# Patient Record
Sex: Female | Born: 1940
Health system: Southern US, Community
[De-identification: ages and names within clinical notes are randomized; demographics above are authoritative.]

## PROBLEM LIST (undated history)

## (undated) DIAGNOSIS — I10 Essential (primary) hypertension: Secondary | ICD-10-CM

## (undated) DIAGNOSIS — M4800 Spinal stenosis, site unspecified: Secondary | ICD-10-CM

## (undated) DIAGNOSIS — H04123 Dry eye syndrome of bilateral lacrimal glands: Secondary | ICD-10-CM

## (undated) DIAGNOSIS — I82409 Acute embolism and thrombosis of unspecified deep veins of unspecified lower extremity: Secondary | ICD-10-CM

## (undated) DIAGNOSIS — E785 Hyperlipidemia, unspecified: Secondary | ICD-10-CM

## (undated) HISTORY — DX: Hyperlipidemia, unspecified: E78.5

## (undated) HISTORY — DX: Essential (primary) hypertension: I10

## (undated) HISTORY — DX: Acute embolism and thrombosis of unspecified deep veins of unspecified lower extremity: I82.409

## (undated) HISTORY — DX: Spinal stenosis, site unspecified: M48.00

## (undated) HISTORY — DX: Dry eye syndrome of bilateral lacrimal glands: H04.123

---

## 2003-04-15 ENCOUNTER — Ambulatory Visit (HOSPITAL_COMMUNITY): Admission: RE | Admit: 2003-04-15 | Discharge: 2003-04-15 | Payer: Self-pay | Admitting: Internal Medicine

## 2003-04-15 ENCOUNTER — Encounter: Payer: Self-pay | Admitting: Internal Medicine

## 2003-05-16 ENCOUNTER — Encounter: Payer: Self-pay | Admitting: Neurosurgery

## 2003-05-16 ENCOUNTER — Encounter: Admission: RE | Admit: 2003-05-16 | Discharge: 2003-05-16 | Payer: Self-pay | Admitting: Neurosurgery

## 2003-10-30 ENCOUNTER — Emergency Department (HOSPITAL_COMMUNITY): Admission: EM | Admit: 2003-10-30 | Discharge: 2003-10-30 | Payer: Self-pay | Admitting: Emergency Medicine

## 2004-07-22 ENCOUNTER — Ambulatory Visit (HOSPITAL_COMMUNITY): Admission: RE | Admit: 2004-07-22 | Discharge: 2004-07-22 | Payer: Self-pay | Admitting: Family Medicine

## 2004-09-01 ENCOUNTER — Other Ambulatory Visit: Admission: RE | Admit: 2004-09-01 | Discharge: 2004-09-01 | Payer: Self-pay

## 2005-08-25 ENCOUNTER — Ambulatory Visit (HOSPITAL_COMMUNITY): Admission: RE | Admit: 2005-08-25 | Discharge: 2005-08-25 | Payer: Self-pay | Admitting: Obstetrics and Gynecology

## 2006-01-03 ENCOUNTER — Emergency Department (HOSPITAL_COMMUNITY): Admission: EM | Admit: 2006-01-03 | Discharge: 2006-01-03 | Payer: Self-pay | Admitting: Obstetrics & Gynecology

## 2006-01-12 ENCOUNTER — Ambulatory Visit (HOSPITAL_COMMUNITY): Admission: RE | Admit: 2006-01-12 | Discharge: 2006-01-12 | Payer: Self-pay | Admitting: Orthopaedic Surgery

## 2006-08-31 ENCOUNTER — Ambulatory Visit (HOSPITAL_COMMUNITY): Admission: RE | Admit: 2006-08-31 | Discharge: 2006-08-31 | Payer: Self-pay | Admitting: Pulmonary Disease

## 2007-09-04 ENCOUNTER — Ambulatory Visit (HOSPITAL_COMMUNITY): Admission: RE | Admit: 2007-09-04 | Discharge: 2007-09-04 | Payer: Self-pay | Admitting: Family Medicine

## 2007-09-14 ENCOUNTER — Other Ambulatory Visit: Admission: RE | Admit: 2007-09-14 | Discharge: 2007-09-14 | Payer: Self-pay | Admitting: Obstetrics and Gynecology

## 2008-08-23 DIAGNOSIS — H04123 Dry eye syndrome of bilateral lacrimal glands: Secondary | ICD-10-CM

## 2008-08-23 HISTORY — DX: Dry eye syndrome of bilateral lacrimal glands: H04.123

## 2008-09-06 ENCOUNTER — Ambulatory Visit (HOSPITAL_COMMUNITY): Admission: RE | Admit: 2008-09-06 | Discharge: 2008-09-06 | Payer: Self-pay | Admitting: Pulmonary Disease

## 2008-10-24 ENCOUNTER — Other Ambulatory Visit: Admission: RE | Admit: 2008-10-24 | Discharge: 2008-10-24 | Payer: Self-pay | Admitting: Obstetrics and Gynecology

## 2008-12-17 ENCOUNTER — Encounter: Payer: Self-pay | Admitting: Physician Assistant

## 2009-04-07 ENCOUNTER — Encounter: Payer: Self-pay | Admitting: Internal Medicine

## 2009-04-09 ENCOUNTER — Encounter: Payer: Self-pay | Admitting: Internal Medicine

## 2009-04-09 ENCOUNTER — Ambulatory Visit: Payer: Self-pay | Admitting: Internal Medicine

## 2009-04-09 ENCOUNTER — Ambulatory Visit (HOSPITAL_COMMUNITY): Admission: RE | Admit: 2009-04-09 | Discharge: 2009-04-09 | Payer: Self-pay | Admitting: Internal Medicine

## 2009-04-09 HISTORY — PX: COLONOSCOPY: SHX174

## 2009-04-15 ENCOUNTER — Encounter: Payer: Self-pay | Admitting: Internal Medicine

## 2009-07-12 ENCOUNTER — Emergency Department (HOSPITAL_COMMUNITY): Admission: EM | Admit: 2009-07-12 | Discharge: 2009-07-12 | Payer: Self-pay | Admitting: Emergency Medicine

## 2009-09-08 ENCOUNTER — Ambulatory Visit (HOSPITAL_COMMUNITY): Admission: RE | Admit: 2009-09-08 | Discharge: 2009-09-08 | Payer: Self-pay | Admitting: Pulmonary Disease

## 2009-11-13 ENCOUNTER — Ambulatory Visit (HOSPITAL_COMMUNITY): Admission: RE | Admit: 2009-11-13 | Discharge: 2009-11-13 | Payer: Self-pay | Admitting: Obstetrics & Gynecology

## 2009-11-28 ENCOUNTER — Encounter: Payer: Self-pay | Admitting: Physician Assistant

## 2010-01-27 ENCOUNTER — Ambulatory Visit: Payer: Self-pay | Admitting: Family Medicine

## 2010-01-27 DIAGNOSIS — M109 Gout, unspecified: Secondary | ICD-10-CM | POA: Insufficient documentation

## 2010-01-27 DIAGNOSIS — I10 Essential (primary) hypertension: Secondary | ICD-10-CM

## 2010-01-27 DIAGNOSIS — J3089 Other allergic rhinitis: Secondary | ICD-10-CM | POA: Insufficient documentation

## 2010-01-27 DIAGNOSIS — J309 Allergic rhinitis, unspecified: Secondary | ICD-10-CM

## 2010-01-28 ENCOUNTER — Encounter: Payer: Self-pay | Admitting: Physician Assistant

## 2010-01-30 ENCOUNTER — Encounter: Payer: Self-pay | Admitting: Physician Assistant

## 2010-02-13 ENCOUNTER — Encounter: Payer: Self-pay | Admitting: Physician Assistant

## 2010-02-13 DIAGNOSIS — M545 Low back pain: Secondary | ICD-10-CM

## 2010-02-13 DIAGNOSIS — E785 Hyperlipidemia, unspecified: Secondary | ICD-10-CM

## 2010-02-13 DIAGNOSIS — M48062 Spinal stenosis, lumbar region with neurogenic claudication: Secondary | ICD-10-CM | POA: Insufficient documentation

## 2010-04-25 ENCOUNTER — Encounter: Payer: Self-pay | Admitting: Physician Assistant

## 2010-04-28 ENCOUNTER — Encounter: Payer: Self-pay | Admitting: Physician Assistant

## 2010-04-28 LAB — CONVERTED CEMR LAB
ALT: 16 units/L (ref 0–35)
AST: 18 units/L (ref 0–37)
Albumin: 4.2 g/dL (ref 3.5–5.2)
Alkaline Phosphatase: 53 units/L (ref 39–117)
BUN: 20 mg/dL (ref 6–23)
CO2: 27 meq/L (ref 19–32)
Calcium: 10.4 mg/dL (ref 8.4–10.5)
Chloride: 104 meq/L (ref 96–112)
Cholesterol: 180 mg/dL (ref 0–200)
Creatinine, Ser: 0.76 mg/dL (ref 0.40–1.20)
Glucose, Bld: 114 mg/dL — ABNORMAL HIGH (ref 70–99)
HCT: 36.9 % (ref 36.0–46.0)
HDL: 45 mg/dL (ref >39)
Hemoglobin: 12.4 g/dL (ref 12.0–15.0)
Hgb A1c MFr Bld: 5.8 % — ABNORMAL HIGH (ref <5.7)
LDL Cholesterol: 110 mg/dL — ABNORMAL HIGH (ref 0–99)
MCHC: 33.6 g/dL (ref 30.0–36.0)
MCV: 90.2 fL (ref 78.0–100.0)
Platelets: 244 10*3/uL (ref 150–400)
Potassium: 3.6 meq/L (ref 3.5–5.3)
RBC: 4.09 M/uL (ref 3.87–5.11)
RDW: 13.3 % (ref 11.5–15.5)
Sodium: 142 meq/L (ref 135–145)
TSH: 0.897 microintl units/mL (ref 0.350–4.500)
Total Bilirubin: 0.5 mg/dL (ref 0.3–1.2)
Total CHOL/HDL Ratio: 4
Total Protein: 7 g/dL (ref 6.0–8.3)
Triglycerides: 123 mg/dL (ref <150)
Uric Acid, Serum: 5.7 mg/dL (ref 2.4–7.0)
VLDL: 25 mg/dL (ref 0–40)
Vit D, 25-Hydroxy: 49 ng/mL (ref 30–89)
WBC: 5.8 10*3/uL (ref 4.0–10.5)

## 2010-04-29 ENCOUNTER — Ambulatory Visit: Payer: Self-pay | Admitting: Family Medicine

## 2010-04-29 DIAGNOSIS — R7301 Impaired fasting glucose: Secondary | ICD-10-CM | POA: Insufficient documentation

## 2010-04-29 DIAGNOSIS — R7303 Prediabetes: Secondary | ICD-10-CM | POA: Insufficient documentation

## 2010-05-29 ENCOUNTER — Encounter: Payer: Self-pay | Admitting: Physician Assistant

## 2010-05-29 ENCOUNTER — Ambulatory Visit: Payer: Self-pay | Admitting: Family Medicine

## 2010-05-29 DIAGNOSIS — M25539 Pain in unspecified wrist: Secondary | ICD-10-CM

## 2010-06-01 ENCOUNTER — Telehealth: Payer: Self-pay | Admitting: Family Medicine

## 2010-06-02 LAB — CONVERTED CEMR LAB: Uric Acid, Serum: 6.4 mg/dL (ref 2.4–7.0)

## 2010-06-29 ENCOUNTER — Telehealth: Payer: Self-pay | Admitting: Family Medicine

## 2010-07-29 LAB — CONVERTED CEMR LAB
BUN: 23 mg/dL (ref 6–23)
CO2: 24 meq/L (ref 19–32)
Calcium: 10.2 mg/dL (ref 8.4–10.5)
Chloride: 106 meq/L (ref 96–112)
Cholesterol: 219 mg/dL — ABNORMAL HIGH (ref 0–200)
Creatinine, Ser: 0.7 mg/dL (ref 0.40–1.20)
Glucose, Bld: 100 mg/dL — ABNORMAL HIGH (ref 70–99)
HDL: 56 mg/dL (ref >39)
Hgb A1c MFr Bld: 6.1 % — ABNORMAL HIGH (ref <5.7)
LDL Cholesterol: 140 mg/dL — ABNORMAL HIGH (ref 0–99)
Potassium: 4.4 meq/L (ref 3.5–5.3)
Sodium: 144 meq/L (ref 135–145)
Total CHOL/HDL Ratio: 3.9
Triglycerides: 113 mg/dL (ref <150)
VLDL: 23 mg/dL (ref 0–40)

## 2010-07-30 ENCOUNTER — Ambulatory Visit: Payer: Self-pay | Admitting: Family Medicine

## 2010-09-10 ENCOUNTER — Ambulatory Visit (HOSPITAL_COMMUNITY)
Admission: RE | Admit: 2010-09-10 | Discharge: 2010-09-10 | Payer: Self-pay | Source: Home / Self Care | Attending: Family Medicine | Admitting: Family Medicine

## 2010-09-10 ENCOUNTER — Telehealth: Payer: Self-pay | Admitting: Family Medicine

## 2010-09-22 NOTE — Letter (Signed)
Summary: Letter  Letter   Imported By: Lind Guest 01/30/2010 15:55:18  _____________________________________________________________________  External Attachment:    Type:   Image     Comment:   External Document

## 2010-09-22 NOTE — Letter (Signed)
Summary: medical release  medical release   Imported By: Lind Guest 01/30/2010 16:13:46  _____________________________________________________________________  External Attachment:    Type:   Image     Comment:   External Document

## 2010-09-22 NOTE — Progress Notes (Signed)
  Phone Note Call from Patient   Caller: Patient Summary of Call: requesting refills on indomethacin and hydrocodone Initial call taken by: Adella Hare LPN,  June 29, 2010 4:45 PM  Follow-up for Phone Call        deny both from the notes her gout was better, will need to sched an ov if this is gain a prob Follow-up by: Syliva Overman MD,  June 29, 2010 5:21 PM  Additional Follow-up for Phone Call Additional follow up Details #1::        said it was better and now it has flared up and we have no appoinments for this week she called here Additional Follow-up by: Lind Guest,  June 30, 2010 10:15 AM    Additional Follow-up for Phone Call Additional follow up Details #2::    pls refill indomethacin only x 1 that is std treatment for gout pls document exactly where is hurting , is it swollen is it the same area? she needs an appt in the next 2 weeks. I will not send the hydrocodone for gout the anti-inflamm is all that's standard Follow-up by: Syliva Overman MD,  June 30, 2010 12:32 PM  Additional Follow-up for Phone Call Additional follow up Details #3:: Details for Additional Follow-up Action Taken: med sent, called patient, left message to return call   Additional Follow-up by: Adella Hare LPN,  July 01, 2010 2:04 PM  New/Updated Medications: INDOMETHACIN 50 MG CAPS (INDOMETHACIN) one cap by mouth three times a day Prescriptions: INDOMETHACIN 50 MG CAPS (INDOMETHACIN) one cap by mouth three times a day  #30 x 0   Entered by:   Adella Hare LPN   Authorized by:   Syliva Overman MD   Signed by:   Adella Hare LPN on 09/81/1914   Method used:   Electronically to        [facility]  [address] (retail)       [address]       [place], [place]  [address]       Ph: [phone]       Fax: [phone]   RxID:   [id number]  right wrist was swollen and hurting patient aware on indomethacin sent in  [facility] LPN  July 01, 2010 2:15 PM

## 2010-09-22 NOTE — Progress Notes (Signed)
Summary: DR.HAWKINS  DR.HAWKINS   Imported By: Lind Guest 02/16/2010 11:18:10  _____________________________________________________________________  External Attachment:    Type:   Image     Comment:   External Document

## 2010-09-22 NOTE — Assessment & Plan Note (Signed)
Summary: new patient- room 3   Vital Signs:  Patient profile:   70 year old female Height:      63.5 inches Weight:      179.75 pounds BMI:     31.46 O2 Sat:      97 % on Room air Pulse rate:   102 / minute Resp:     16 per minute BP sitting:   150 / 90  (left arm)  Vitals Entered By: Adella Hare LPN (January 28, 1307 1:45 PM)  Serial Vital Signs/Assessments:  Time      Position  BP       Pulse  Resp  Temp     By                     150/82                         Esperanza Sheets PA  CC: new patient  Is Patient Diabetic? No Pain Assessment Patient in pain? no        CC:  new patient .  History of Present Illness: New pt here to establish care with new PCP.  Pt has a hx of htn.  Her neice will check her BP at home and runs 120-130 over 70.  No HA, chest pain or pressure, or difficulty breathing.  Pt is also taking a potassium pill.  She did not bring this with her today & she does not know what strength it is.  Pt walks 4 miles every day for exercise.  Pt states she has had some back pain in the past is not bothering her now.  When it flares up she takes Ibuprofen.  Hx of gout.  Pt has decreased her Allopurinol dose to every other day.  Last gout flare up Nov 2010.  Last labs done 4-11.  Pt brought paperwork with her.      Lipids:  Total Chol 195, HDL 44, LDL 124, Trigs 134      Uric Acid 6.8 Last Mamm Jan 011 Last Pap March 2011 - Dr Rayna Sexton office.   Current Medications (verified): 1)  Tekturna Hct 300-25 Mg Tabs (Aliskiren-Hydrochlorothiazide) .... One Tab By Mouth Once Daily 2)  Multivitamins  Tabs (Multiple Vitamin) .... One Tab By Mouth Once Daily 3)  Flax Seed Oil 1000 Mg Caps (Flaxseed (Linseed)) .... One Cap By Mouth Once Daily 4)  Calcium Carbonate 600 Mg Tabs (Calcium Carbonate) .... One Tab By Mouth Once Daily 5)  Fish Oil 1000 Mg Caps (Omega-3 Fatty Acids) .... One Cap By Mouth Once Daily 6)  Allopurinol 300 Mg Tabs (Allopurinol) .... One Tab By Mouth  Once Daily 7)  Exforge 5-160 Mg Tabs (Amlodipine Besylate-Valsartan) .... One Tab By Mouth Once Daily  Allergies (verified): No Known Drug Allergies  Past History:  Past medical, surgical, family and social histories (including risk factors) reviewed for relevance to current acute and chronic problems.  Past Medical History: Hypertension Gout  Past Surgical History: Caesarean section  Family History: Reviewed history and no changes required. mother deceased-stroke father deceased- stroke 3 sisters deceased- DM, blood clots 4 brothers deceased- health status unknown  Social History: Reviewed history and no changes required. 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 ENT:  Denies nasal  congestion and sore throat. CV:  Denies chest pain or discomfort and palpitations. Resp:  Denies cough and shortness of breath. GI:  Denies abdominal pain, change in bowel habits, indigestion, nausea, and vomiting. GU:  Denies dysuria, incontinence, and urinary frequency. MS:  Denies low back pain.  Physical Exam  General:  Well-developed,well-nourished,in no acute distress; alert,appropriate and cooperative throughout examination Head:  Normocephalic and atraumatic without obvious abnormalities. No apparent alopecia or balding. Eyes:  pupils equal, pupils round, and pupils reactive to light.   Ears:  External ear exam shows no significant lesions or deformities.  Otoscopic examination reveals clear canals, tympanic membranes are intact bilaterally without bulging, retraction, inflammation or discharge. Hearing is grossly normal bilaterally. Nose:  External nasal examination shows no deformity or inflammation. Nasal mucosa are pink and moist without lesions or exudates. Mouth:  Oral mucosa and oropharynx without lesions or  exudates.  Neck:  No deformities, masses, or tenderness noted. Lungs:  Normal respiratory effort, chest expands symmetrically. Lungs are clear to auscultation, no crackles or wheezes. Heart:  Normal rate and regular rhythm. S1 and S2 normal without gallop, murmur, click, rub or other extra sounds. Extremities:  No PTE Neurologic:  alert & oriented X3, sensation intact to light touch, and gait normal.   Cervical Nodes:  No lymphadenopathy noted Psych:  Cognition and judgment appear intact. Alert and cooperative with normal attention span and concentration. No apparent delusions, illusions, hallucinations   Impression & Recommendations:  Problem # 1:  HYPERTENSION (ICD-401.9) Assessment Comment Only  Her updated medication list for this problem includes:    Tekturna Hct 300-25 Mg Tabs (Aliskiren-hydrochlorothiazide) ..... One tab by mouth once daily    Exforge 5-160 Mg Tabs (Amlodipine besylate-valsartan) ..... One tab by mouth once daily  BP today: 150/90  Orders: T-CMP with estimated GFR ([id number])  Problem # 2:  GOUT (ICD-274.9) Assessment: Comment Only  Her updated medication list for this problem includes:    Allopurinol 300 Mg Tabs (Allopurinol) ..... One tab by mouth once daily  Orders: T-Uric Acid (Blood) ([id number])  Problem # 3:  ALLERGIC RHINITIS, SEASONAL (ICD-477.0) Assessment: Deteriorated Pt states that her eye dr has taken her off of allergy meds in the past due to her dry eye syptoms.  She will check with them to see if there is anything she can take for this.  Complete Medication List: 1)  Tekturna Hct 300-25 Mg Tabs (Aliskiren-hydrochlorothiazide) .... One tab by mouth once daily 2)  Multivitamins Tabs (Multiple vitamin) .... One tab by mouth once daily 3)  Flax Seed Oil 1000 Mg Caps (Flaxseed (linseed)) .... One cap by mouth once daily 4)  Calcium Carbonate 600 Mg Tabs (Calcium carbonate) .... One tab by mouth once daily 5)  Fish Oil 1000 Mg Caps  (Omega-3 fatty acids) .... One cap by mouth once daily 6)  Allopurinol 300 Mg Tabs (Allopurinol) .... One tab by mouth once daily 7)  Exforge 5-160 Mg Tabs (Amlodipine besylate-valsartan) .... One tab by mouth once daily  Other Orders: T-Lipid Profile [id number]) T-CBC No Diff ([id number]) T-TSH [id number]) T-Vitamin D (25-Hydroxy) [id number])  Patient Instructions: 1)  Please schedule a follow-up appointment in 3 months. 2)  I have ordered blood work for you.  Please have have this drawn fasting before your appt in 3 months. 3)  Continue your current medications. 4)  Check your Blood Pressure regularly. If it is above170/100 you should make an appointment. 5)  It is important that you exercise regularly at least 20  minutes 5 times a week. If you develop chest pain, have severe difficulty breathing, or feel very tired , stop exercising immediately and seek medical attention. 6)  You need to lose weight. Consider a lower calorie diet and regular exercise.

## 2010-09-22 NOTE — Miscellaneous (Signed)
Summary: med history  Clinical Lists Changes  Problems: Added new problem of LOW BACK PAIN, CHRONIC (ICD-724.2) Added new problem of SPINAL STENOSIS (ICD-724.00) Added new problem of HYPERLIPIDEMIA (ICD-272.4) Added new problem of HYPOKALEMIA (ICD-276.8)

## 2010-09-22 NOTE — Assessment & Plan Note (Signed)
Summary: follow up- room 1   Vital Signs:  Patient profile:   70 year old female Height:      63.5 inches Weight:      180.50 pounds BMI:     31.59 O2 Sat:      98 % on Room air Pulse rate:   94 / minute Resp:     16 per minute BP sitting:   160 / 90  (left arm)  Vitals Entered By: Adella Hare LPN (April 29, 2010 1:19 PM)  Nutrition Counseling: Patient's BMI is greater than 25 and therefore counseled on weight management options.  Serial Vital Signs/Assessments:  Time      Position  BP       Pulse  Resp  Temp     By                     132/84                         Esperanza Sheets PA  CC: follow-up visit Is Patient Diabetic? No Pain Assessment Patient in pain? no        CC:  follow-up visit.  History of Present Illness: Pt is here today for check up and discuss recent lab results.  Overall is feeling well. Hx of htn. Taking medications as prescribed and denies side effects.  No headache, chest pain or palpitations.  Her blood pressure is much lower at home than in the Drs office.  Walking 4 miles a day for exercise.  Hx of gout.  No flare ups.  Is taking Allopurinol every other day.  Prev took it every day, but since was well controlled was able to decrease to every other day.  Denies: Headache, or dizziness. Chest pain or discomfort, palpitations, and peripheral edema. Cough or difficulty breathing. Abd pain, indigestion, and no change in bowel habits.  Has chronic constipation problems.  Last colonoscopy approx 2 yrs ago. No joint pain or swelling.    Current Medications (verified): 1)  Tekturna Hct 300-25 Mg Tabs (Aliskiren-Hydrochlorothiazide) .... One Tab By Mouth Once Daily 2)  Multivitamins  Tabs (Multiple Vitamin) .... One Tab By Mouth Once Daily 3)  Flax Seed Oil 1000 Mg Caps (Flaxseed (Linseed)) .... One Cap By Mouth Once Daily 4)  Calcium Carbonate 600 Mg Tabs (Calcium Carbonate) .... One Tab By Mouth Once Daily 5)  Fish Oil 1000 Mg Caps (Omega-3  Fatty Acids) .... One Cap By Mouth Once Daily 6)  Allopurinol 300 Mg Tabs (Allopurinol) .... One Tab By Mouth Once Daily 7)  Exforge 5-160 Mg Tabs (Amlodipine Besylate-Valsartan) .... One Tab By Mouth Once Daily  Allergies (verified): No Known Drug Allergies  Past History:  Past medical history reviewed for relevance to current acute and chronic problems.  Past Medical History: Reviewed history from 01/27/2010 and no changes required. Hypertension Gout  Review of Systems      See HPI GI:  Denies abdominal pain, bloody stools, and dark tarry stools. MS:  Denies joint pain, joint redness, and joint swelling.  Physical Exam  General:  Well-developed,well-nourished,in no acute distress; alert,appropriate and cooperative throughout examination Head:  Normocephalic and atraumatic without obvious abnormalities. No apparent alopecia or balding. Ears:  External ear exam shows no significant lesions or deformities.  Otoscopic examination reveals clear canals, tympanic membranes are intact bilaterally without bulging, retraction, inflammation or discharge. Hearing is grossly normal bilaterally. Nose:  External nasal examination shows no  deformity or inflammation. Nasal mucosa are pink and moist without lesions or exudates. Mouth:  Oral mucosa and oropharynx without lesions or exudates.  teeth missing.   Neck:  No deformities, masses, or tenderness noted. Lungs:  Normal respiratory effort, chest expands symmetrically. Lungs are clear to auscultation, no crackles or wheezes. Heart:  Normal rate and regular rhythm. S1 and S2 normal without gallop, murmur, click, rub or other extra sounds. Abdomen:  Bowel sounds positive,abdomen soft and non-tender without masses, organomegaly or hernias noted. Pulses:  R posterior tibial normal, R dorsalis pedis normal, L posterior tibial normal, and L dorsalis pedis normal.   Extremities:  No PTE Cervical Nodes:  No lymphadenopathy noted Psych:  Cognition and  judgment appear intact. Alert and cooperative with normal attention span and concentration. No apparent delusions, illusions, hallucinations   Impression & Recommendations:  Problem # 1:  HYPERTENSION (ICD-401.9) Assessment Comment Only  Her updated medication list for this problem includes:    Tekturna Hct 300-25 Mg Tabs (Aliskiren-hydrochlorothiazide) ..... One tab by mouth once daily    Exforge 5-160 Mg Tabs (Amlodipine besylate-valsartan) ..... One tab by mouth once daily  Orders: T-Basic Metabolic Panel [id number])  BP today: 160/90 Prior BP: 150/90 (01/27/2010)  Labs Reviewed: K+: 3.6 (04/25/2010) Creat: : 0.76 (04/25/2010)   Chol: 180 (04/25/2010)   HDL: 45 (04/25/2010)   LDL: 110 (04/25/2010)   TG: 123 (04/25/2010)  Problem # 2:  GOUT (ICD-274.9) Assessment: Comment Only  Controlled.  Uric acid < 6.0.  Her updated medication list for this problem includes:    Allopurinol 300 Mg Tabs (Allopurinol) ..... One tab by mouth one every other daily  Problem # 3:  HYPERLIPIDEMIA (ICD-272.4) Assessment: Comment Only  Orders: T-Lipid Profile [id number])  Labs Reviewed: SGOT: 18 (04/25/2010)   SGPT: 16 (04/25/2010)   HDL:45 (04/25/2010)  LDL:110 (04/25/2010)  Chol:180 (04/25/2010)  Trig:123 (04/25/2010)  Problem # 4:  IMPAIRED FASTING GLUCOSE (ICD-790.21) Assessment: New Discussed diet, wt loss and continue walking daily for exercise.  Orders: T- Hemoglobin A1C ([id number])  Complete Medication List: 1)  Tekturna Hct 300-25 Mg Tabs (Aliskiren-hydrochlorothiazide) .... One tab by mouth once daily 2)  Multivitamins Tabs (Multiple vitamin) .... One tab by mouth once daily 3)  Flax Seed Oil 1000 Mg Caps (Flaxseed (linseed)) .... One cap by mouth once daily 4)  Calcium Carbonate 600 Mg Tabs (Calcium carbonate) .... One tab by mouth once daily 5)  Fish Oil 1000 Mg Caps (Omega-3 fatty acids) .... One cap by mouth once daily 6)  Allopurinol 300 Mg Tabs (Allopurinol) ....  One tab by mouth one every other daily 7)  Exforge 5-160 Mg Tabs (Amlodipine besylate-valsartan) .... One tab by mouth once daily 8)  Klor-con 10 10 Meq Cr-tabs (Potassium chloride) .... Take one daily 9)  Restasis 0.05 % Emul (Cyclosporine) .... One drop each eye bid  Patient Instructions: 1)  Please schedule a follow-up appointment in 3 months. 2)  Have blood work drawn fasting before your next appt in 3 mos. 3)  Take an Aspirin every day. 4)  I recommend you try Benefiber for your constipation.  Other options are Metamucil or Fibercon. 5)  Follow a low fat diet. 6)  Decrease your sweets and carbs in your diet to help with your sugar.

## 2010-09-22 NOTE — Progress Notes (Signed)
Summary: wrist  Phone Note Call from Patient   Summary of Call: right wrist is doing good. no hurting. just let PA know. [phone] Initial call taken by: Rudene Anda,  June 01, 2010 10:57 AM  Follow-up for Phone Call        noted.  Will forward to PA Follow-up by: Everitt Amber LPN,  June 01, 2010 11:05 AM

## 2010-09-22 NOTE — Assessment & Plan Note (Signed)
Summary: OV Room 1    Vital Signs:  Patient profile:   70 year old female Height:      63.5 inches Weight:      175.25 pounds BMI:     30.67 O2 Sat:      98 % Pulse rate:   88 / minute Resp:     16 per minute BP sitting:   128 / 88  (left arm)  Vitals Entered By: Mauricia Area, CMA CC: having problems with tendonitis, ok during the day but can't sleep at night    CC:  having problems with tendonitis and ok during the day but can't sleep at night .  History of Present Illness: Pt presents today with c/o Rt wrist pain x 3 days.  She did lift a small TV a few days before and is wondering if she might have a tendonitis from it.  She has a hx of tendonitis in her wrist 5-6 yrs ago with same syptoms. Pt states she had sudden onset of pain and swelling.  Pain is worse at HS.  Has been red also.   Allergies: No Known Drug Allergies  Past History:  Past medical history reviewed for relevance to current acute and chronic problems.  Past Medical History: Reviewed history from 01/27/2010 and no changes required. Hypertension Gout  Review of Systems General:  Denies chills and fever. CV:  Denies chest pain or discomfort. Resp:  Denies shortness of breath. MS:  Complains of joint pain, joint redness, and joint swelling.  Physical Exam  General:  Well-developed,well-nourished,in no acute distress; alert,appropriate and cooperative throughout examination Head:  Normocephalic and atraumatic without obvious abnormalities. No apparent alopecia or balding. Lungs:  Normal respiratory effort, chest expands symmetrically. Lungs are clear to auscultation, no crackles or wheezes. Heart:  Normal rate and regular rhythm. S1 and S2 normal without gallop, murmur, click, rub or other extra sounds. Msk:  Lt wrist and dorsum of hand swollen and erythematous, + warm to touch, and very painful to palpation, decreased flexion and extension of wrist due to pain Pulses:  R radial normal.   Neurologic:   alert & oriented X3, sensation intact to light touch, and gait normal.   Skin:  no rashes.   Psych:  Cognition and judgment appear intact. Alert and cooperative with normal attention span and concentration. No apparent delusions, illusions, hallucinations   Impression & Recommendations:  Problem # 1:  WRIST PAIN, RIGHT (KVQ-259.56) Assessment New Suspect gout flare.  Orders: T-Uric Acid (Blood) ([id number]) Ketorolac-Toradol 15mg  (J1884) Depo- Medrol 80mg  (J1040) Admin of Therapeutic Inj  intramuscular or subcutaneous ([address]606)  Problem # 2:  HYPERTENSION (ICD-401.9) Assessment: Improved  Her updated medication list for this problem includes:    Tekturna Hct 300-25 Mg Tabs (Aliskiren-hydrochlorothiazide) ..... One tab by mouth once daily    Exforge 5-160 Mg Tabs (Amlodipine besylate-valsartan) ..... One tab by mouth once daily  BP today: 128/88 Prior BP: 160/90 (04/29/2010)  Labs Reviewed: K+: 3.6 (04/25/2010) Creat: : 0.76 (04/25/2010)   Chol: 180 (04/25/2010)   HDL: 45 (04/25/2010)   LDL: 110 (04/25/2010)   TG: 123 (04/25/2010)  Complete Medication List: 1)  Tekturna Hct 300-25 Mg Tabs (Aliskiren-hydrochlorothiazide) .... One tab by mouth once daily 2)  Multivitamins Tabs (Multiple vitamin) .... One tab by mouth once daily 3)  Flax Seed Oil 1000 Mg Caps (Flaxseed (linseed)) .... One cap by mouth once daily 4)  Calcium Carbonate 600 Mg Tabs (Calcium carbonate) .... One tab by mouth once daily  5)  Fish Oil 1000 Mg Caps (Omega-3 fatty acids) .... One cap by mouth once daily 6)  Allopurinol 300 Mg Tabs (Allopurinol) .... One tab by mouth one every other daily 7)  Exforge 5-160 Mg Tabs (Amlodipine besylate-valsartan) .... One tab by mouth once daily 8)  Klor-con 10 10 Meq Cr-tabs (Potassium chloride) .... Take one daily 9)  Restasis 0.05 % Emul (Cyclosporine) .... One drop each eye bid 10)  Indomethacin 50 Mg Caps (Indomethacin) .... Take 1 capsule three times a day 11)   Hydrocodone-acetaminophen 5-325 Mg Tabs (Hydrocodone-acetaminophen) .... Take 1 every 6 hrs as needed for pain  Patient Instructions: 1)  Keep your next scheduled appt. 2)  If you are not feeling much better on Monday, call the office and I will refer you back to Dr Hilda Lias. 3)  I have prescribed a sling for you. 4)  You have received Toradol for pain and Depo Medrol for inflammation. 5)  Take the Indomethacin three times a day. 6)  Use they Hydrocodone as needed for pain. 7)  Have  your blood drawn today. Prescriptions: EXFORGE 5-160 MG TABS (AMLODIPINE BESYLATE-VALSARTAN) one tab by mouth once daily  #30 x 3   Entered and Authorized by:   Esperanza Sheets PA   Signed by:   Esperanza Sheets PA on 05/29/2010   Method used:   Electronically to        [facility]  [address] (retail)       [address]       [place], [place]  [address]       Ph: [phone]       Fax: [phone]   RxID:   [id number] TEKTURNA HCT 300-25 MG TABS (ALISKIREN-HYDROCHLOROTHIAZIDE) one tab by mouth once daily  #30 x 3   Entered and Authorized by:   Esperanza Sheets PA   Signed by:   Esperanza Sheets PA on 05/29/2010   Method used:   Electronically to        [facility]  [address] (retail)       [address]       [place], [place]  [address]       Ph: [phone]       Fax: [phone]   RxID:   [id number] HYDROCODONE-ACETAMINOPHEN 5-325 MG TABS (HYDROCODONE-ACETAMINOPHEN) take 1 every 6 hrs as needed for pain  #20 x 0   Entered and Authorized by:   Esperanza Sheets PA   Signed by:   Esperanza Sheets PA on 05/29/2010   Method used:   Printed then faxed to ...       [facility]  [address] (retail)       [address]       [place], [place]  [address]       Ph: [phone]       Fax: [phone]   RxID:   [id number] KLOR-CON 10 10 MEQ CR-TABS (POTASSIUM CHLORIDE) take one daily  #30 x 3   Entered and Authorized by:   Esperanza Sheets PA   Signed by:   Esperanza Sheets  PA on 05/29/2010   Method used:   Electronically to        [facility]  [address] (retail)       [address]       [place]  [place], [place]  [address]       Ph: [phone]       Fax: [phone]   RxID:   [id number] INDOMETHACIN 50 MG CAPS (INDOMETHACIN) take 1 capsule three times a day  #30 x 0   Entered and Authorized by:   Esperanza Sheets PA   Signed by:   Esperanza Sheets PA on 05/29/2010   Method used:   Electronically to        [facility]  [address] (retail)       [address]       [place], [place]  [address]       Ph: [phone]       Fax: [phone]   RxID:   [id number]    Medication Administration  Injection # 1:    Medication: Ketorolac-Toradol 15mg     Diagnosis: WRIST PAIN, RIGHT (ZSW-109.32)    Route: IM    Site: RUOQ gluteus    Exp Date: 10/22/2011    Lot #: [id number]    Mfr: [email or web address]    Comments: 60 mg given    Patient tolerated injection without complications    Given by: Mauricia Area, CMA  Injection # 2:    Medication: Depo- Medrol 80mg     Diagnosis: WRIST PAIN, RIGHT (GUR-427.06)    Route: IM    Site: LUOQ gluteus    Exp Date: 12/2010    Lot #: [id number]    Mfr: Pharmacia    Patient tolerated injection without complications    Given by: Mauricia Area, CMA  Orders Added: 1)  T-Uric Acid (Blood) [[id number]] 2)  Ketorolac-Toradol 15mg  [J1885] 3)  Depo- Medrol 80mg  [J1040] 4)  Admin of Therapeutic Inj  intramuscular or subcutaneous [96372] 5)  Est. Patient Level III [73710]

## 2010-09-24 NOTE — Assessment & Plan Note (Signed)
Summary: office visit   Vital Signs:  Patient profile:   70 year old female Menstrual status:  hysterectomy Height:      63.5 inches Weight:      171.25 pounds BMI:     29.97 O2 Sat:      97 % on Room air Pulse rate:   100 / minute Pulse rhythm:   regular Resp:     16 per minute BP sitting:   130 / 72  (left arm)  Vitals Entered By: Adella Hare LPN (July 30, 2010 11:26 AM)  Nutrition Counseling: Patient's BMI is greater than 25 and therefore counseled on weight management options.  O2 Flow:  Room air CC: follow-up visit Is Patient Diabetic? No Pain Assessment Patient in pain? no          Menstrual Status hysterectomy   Primary Care Provider:  Syliva Overman MD  CC:  follow-up visit.  History of Present Illness: Reports  that she has been doing well. Denies recent fever or chills. Denies sinus pressure, nasal congestion , ear pain or sore throat. Denies chest congestion, or cough productive of sputum. Denies chest pain, palpitations, PND, orthopnea or leg swelling. Denies abdominal pain, nausea, vomitting, diarrhea or constipation. Denies change in bowel movements or bloody stool. Denies dysuria ,Increased l Denies headaches, vertigo, seizures. Denies depression, anxiety or insomnia. Denies  rash, lesions, or itch.     Current Medications (verified): 1)  Tekturna Hct 300-25 Mg Tabs (Aliskiren-Hydrochlorothiazide) .... One Tab By Mouth Once Daily 2)  Multivitamins  Tabs (Multiple Vitamin) .... One Tab By Mouth Once Daily 3)  Flax Seed Oil 1000 Mg Caps (Flaxseed (Linseed)) .... One Cap By Mouth Once Daily 4)  Calcium Carbonate 600 Mg Tabs (Calcium Carbonate) .... One Tab By Mouth Once Daily 5)  Fish Oil 1000 Mg Caps (Omega-3 Fatty Acids) .... One Cap By Mouth Once Daily 6)  Allopurinol 300 Mg Tabs (Allopurinol) .... One Tab By Mouth One Every Other Daily 7)  Exforge 5-160 Mg Tabs (Amlodipine Besylate-Valsartan) .... One Tab By Mouth Once Daily 8)   Klor-Con 10 10 Meq Cr-Tabs (Potassium Chloride) .... Take One Daily 9)  Restasis 0.05 % Emul (Cyclosporine) .... One Drop Each Eye Bid 10)  Indomethacin 50 Mg Caps (Indomethacin) .... One Cap By Mouth Three Times A Day 11)  Aspir-Low 81 Mg Tbec (Aspirin) .... One Tab By Mouth Once Daily  Allergies (verified): No Known Drug Allergies  Past History:  Past Medical History: Hypertension  since age approx in her 39's Gout   since 2009 Dry eyes on restasis since 2010 Dr. Charise Killian  Review of Systems      See HPI Eyes:  Complains of vision loss-both eyes; denies blurring, discharge, eye irritation, and eye pain. MS:  Complains of low back pain and mid back pain; chronic and unchanged. Endo:  Denies cold intolerance, excessive hunger, excessive thirst, and excessive urination. Heme:  Denies abnormal bruising, bleeding, enlarge lymph nodes, and fevers. Allergy:  Denies hives or rash and persistent infections.  Physical Exam  General:  Well-developed,well-nourished,in no acute distress; alert,appropriate and cooperative throughout examination HEENT: No facial asymmetry,  EOMI, No sinus tenderness, TM's Clear, oropharynx  pink and moist.   Chest: Clear to auscultation bilaterally.  CVS: S1, S2, No murmurs, No S3.   Abd: Soft, Nontender.  MS: decreased  ROM spine,adequate in  hips, shoulders and knees.  Ext: No edema.   CNS: CN 2-12 intact, power tone and sensation normal throughout.  Skin: Intact, no visible lesions or rashes.  Psych: Good eye contact, normal affect.  Memory intact, not anxious or depressed appearing.    Impression & Recommendations:  Problem # 1:  HYPERLIPIDEMIA (ICD-272.4) Assessment Deteriorated  Labs Reviewed: SGOT: 18 (04/25/2010)   SGPT: 16 (04/25/2010)   HDL:56 (07/29/2010), 45 (04/25/2010)  LDL:140 (07/29/2010), 110 (04/25/2010)  Chol:219 (07/29/2010), 180 (04/25/2010)  Trig:113 (07/29/2010), 123 (04/25/2010) Low fat dietdiscussed and encouraged  Problem #  2:  HYPERTENSION (ICD-401.9) Assessment: Unchanged  Her updated medication list for this problem includes:    Tekturna Hct 300-25 Mg Tabs (Aliskiren-hydrochlorothiazide) ..... One tab by mouth once daily    Exforge 5-160 Mg Tabs (Amlodipine besylate-valsartan) ..... One tab by mouth once daily  BP today: 130/72 Prior BP: 128/88 (05/29/2010)  Labs Reviewed: K+: 4.4 (07/29/2010) Creat: : 0.70 (07/29/2010)   Chol: 219 (07/29/2010)   HDL: 56 (07/29/2010)   LDL: 140 (07/29/2010)   TG: 113 (07/29/2010)  Problem # 3:  LOW BACK PAIN, CHRONIC (ICD-724.2) Assessment: Unchanged  The following medications were removed from the medication list:    Hydrocodone-acetaminophen 5-325 Mg Tabs (Hydrocodone-acetaminophen) .Marland Kitchen... Take 1 every 6 hrs as needed for pain Her updated medication list for this problem includes:    Indomethacin 50 Mg Caps (Indomethacin) ..... One cap by mouth three times a day    Aspir-low 81 Mg Tbec (Aspirin) ..... One tab by mouth once daily  Complete Medication List: 1)  Tekturna Hct 300-25 Mg Tabs (Aliskiren-hydrochlorothiazide) .... One tab by mouth once daily 2)  Multivitamins Tabs (Multiple vitamin) .... One tab by mouth once daily 3)  Flax Seed Oil 1000 Mg Caps (Flaxseed (linseed)) .... One cap by mouth once daily 4)  Calcium Carbonate 600 Mg Tabs (Calcium carbonate) .... One tab by mouth once daily 5)  Fish Oil 1000 Mg Caps (Omega-3 fatty acids) .... One cap by mouth once daily 6)  Allopurinol 300 Mg Tabs (Allopurinol) .... One tab by mouth one every other daily 7)  Exforge 5-160 Mg Tabs (Amlodipine besylate-valsartan) .... One tab by mouth once daily 8)  Klor-con 10 10 Meq Cr-tabs (Potassium chloride) .... Take one daily 9)  Restasis 0.05 % Emul (Cyclosporine) .... One drop each eye bid 10)  Indomethacin 50 Mg Caps (Indomethacin) .... One cap by mouth three times a day 11)  Aspir-low 81 Mg Tbec (Aspirin) .... One tab by mouth once daily  Other Orders: T- Hemoglobin  A1C ([id number])  Patient Instructions: 1)  Please schedule a follow-up appointment in 4 months. 2)  It is important that you exercise regularly at least 20 minutes 5 times a week. If you develop chest pain, have severe difficulty breathing, or feel very tired , stop exercising immediately and seek medical attention. 3)  You need to lose weight. Consider a lower calorie diet and regular exercise.  4)  HBA1C 5)  you need to follow a low fat and low carb diet. 6)  Continue the walking   Orders Added: 1)  Est. Patient Level IV [09811] 2)  T- Hemoglobin A1C [[id number]]

## 2010-09-24 NOTE — Progress Notes (Signed)
Summary: MEDICINE  Phone Note Call from Patient   Summary of Call: NEEDS HER ALLOPURINOL 300 MG SEND TO [facility] WAS PREVIOUSLY WRITTEN BY DR Juanetta Gosling Initial call taken by: Lind Guest,  September 10, 2010 9:57 AM  Follow-up for Phone Call        okay to fill? Follow-up by: Adella Hare LPN,  September 10, 2010 10:21 AM  Additional Follow-up for Phone Call Additional follow up Details #1::        I have entered allopurinol historically, pls send after you spk with her, she needs this, ALSO she absolutely needs to stop exforge due to new warningk with tekturna products, new is a higher dose of amlodipne , entered historically, notify pt andpharmacy send in also.  She needs an appt to be scheduled in 4 to 5 weeks, I will send a flag to front staff also Additional Follow-up by: Syliva Overman MD,  September 10, 2010 12:19 PM    Additional Follow-up for Phone Call Additional follow up Details #2::    meds sent, patient aware Follow-up by: Adella Hare LPN,  September 10, 2010 1:56 PM  New/Updated Medications: ALLOPURINOL 300 MG TABS (ALLOPURINOL) Take 1 tablet by mouth once a day AMLODIPINE BESYLATE 10 MG TABS (AMLODIPINE BESYLATE) Take 1 tablet by mouth once a day , pt to stop amlodipine/benazepril Prescriptions: AMLODIPINE BESYLATE 10 MG TABS (AMLODIPINE BESYLATE) Take 1 tablet by mouth once a day , pt to stop amlodipine/benazepril  #30 x 3   Entered by:   Adella Hare LPN   Authorized by:   Syliva Overman MD   Signed by:   Adella Hare LPN on 47/82/9562   Method used:   Electronically to        [facility]  [address] (retail)       [address]       [place], [place]  [address]       Ph: [phone]       Fax: [phone]   RxID:   [id number] ALLOPURINOL 300 MG TABS (ALLOPURINOL) Take 1 tablet by mouth once a day  #30 x 3   Entered by:   Adella Hare LPN   Authorized by:   Syliva Overman MD   Signed by:   Adella Hare LPN on 59/56/3875   Method used:    Electronically to        [facility]  [address] (retail)       [address]       [place], [place]  [address]       Ph: [phone]       Fax: [phone]   RxID:   [id number] AMLODIPINE BESYLATE 10 MG TABS (AMLODIPINE BESYLATE) Take 1 tablet by mouth once a day , pt to stop amlodipine/benazepril  #30 x 3   Entered and Authorized by:   Syliva Overman MD   Signed by:   Syliva Overman MD on 09/10/2010   Method used:   Historical   RxID:   [id number] ALLOPURINOL 300 MG TABS (ALLOPURINOL) Take 1 tablet by mouth once a day  #30 x 3   Entered and Authorized by:   Syliva Overman MD   Signed by:   Syliva Overman MD on 09/10/2010   Method used:   Historical   RxID:   [id number]

## 2010-10-04 ENCOUNTER — Encounter: Payer: Self-pay | Admitting: Family Medicine

## 2010-10-05 ENCOUNTER — Encounter: Payer: Self-pay | Admitting: Family Medicine

## 2010-10-05 ENCOUNTER — Ambulatory Visit (INDEPENDENT_AMBULATORY_CARE_PROVIDER_SITE_OTHER): Payer: Medicare PPO | Admitting: Family Medicine

## 2010-10-05 DIAGNOSIS — I10 Essential (primary) hypertension: Secondary | ICD-10-CM

## 2010-10-05 DIAGNOSIS — M109 Gout, unspecified: Secondary | ICD-10-CM

## 2010-10-05 DIAGNOSIS — E785 Hyperlipidemia, unspecified: Secondary | ICD-10-CM

## 2010-10-14 NOTE — Assessment & Plan Note (Signed)
Vital Signs:  Patient profile:   70 year old female Menstrual status:  hysterectomy Height:      63.5 inches Weight:      166.25 pounds BMI:     29.09 O2 Sat:      97 % Pulse rate:   100 / minute Pulse rhythm:   regular Resp:     16 per minute BP sitting:   160 / 90  (left arm) Cuff size:   large  Vitals Entered By: Everitt Amber LPN (October 05, 2010 12:56 PM)  Nutrition Counseling: Patient's BMI is greater than 25 and therefore counseled on weight management options. CC: Follow up chronic problems   Primary Care Provider:  Syliva Overman MD  CC:  Follow up chronic problems.  History of Present Illness: Reports  that she has been  doing well. Denies recent fever or chills. Denies sinus pressure, nasal congestion , ear pain or sore throat. Denies chest congestion, or cough productive of sputum. Denies chest pain, palpitations, PND, orthopnea or leg swelling. Denies abdominal pain, nausea, vomitting, diarrhea or constipation. Denies change in bowel movements or bloody stool. Denies dysuria , frequency, incontinence or hesitancy.  Denies headaches, vertigo, seizures. Denies depression, anxiety or insomnia. Denies  rash, lesions, or itch.     Current Medications (verified): 1)  Multivitamins  Tabs (Multiple Vitamin) .... One Tab By Mouth Once Daily 2)  Flax Seed Oil 1000 Mg Caps (Flaxseed (Linseed)) .... One Cap By Mouth Once Daily 3)  Calcium Carbonate 600 Mg Tabs (Calcium Carbonate) .... One Tab By Mouth Once Daily 4)  Fish Oil 1000 Mg Caps (Omega-3 Fatty Acids) .... One Cap By Mouth Once Daily 5)  Allopurinol 300 Mg Tabs (Allopurinol) .... One Tab By Mouth One Every Other Daily 6)  Klor-Con 10 10 Meq Cr-Tabs (Potassium Chloride) .... Take One Daily 7)  Restasis 0.05 % Emul (Cyclosporine) .... One Drop Each Eye Bid 8)  Aspir-Low 81 Mg Tbec (Aspirin) .... One Tab By Mouth Once Daily 9)  Allopurinol 300 Mg Tabs (Allopurinol) .... Take 1 Tablet By Mouth Once A  Day 10)  Amlodipine Besylate 10 Mg Tabs (Amlodipine Besylate) .... Take 1 Tablet By Mouth Once A Day , Pt To Stop Amlodipine/benazepril  Allergies (verified): No Known Drug Allergies  Past History:  Past Medical History: Hypertension  since age approx in her 65's Gout   since 2009 Dry eyes on restasis since 2010 Dr. Charise Killian spinal stenosis  Review of Systems      See HPI Eyes:  Denies discharge, eye pain, and red eye. MS:  Complains of low back pain and mid back pain. Endo:  Denies cold intolerance and excessive hunger. Heme:  Denies abnormal bruising and bleeding. Allergy:  Denies hives or rash and itching eyes.  Physical Exam  General:  Well-developed,well-nourished,in no acute distress; alert,appropriate and cooperative throughout examination HEENT: No facial asymmetry,  EOMI, No sinus tenderness, TM's Clear, oropharynx  pink and moist.   Chest: Clear to auscultation bilaterally.  CVS: S1, S2, No murmurs, No S3.   Abd: Soft, Nontender.  MS: decreased  ROM spine,adequate in  hips, shoulders and knees.  Ext: No edema.   CNS: CN 2-12 intact, power tone and sensation normal throughout.   Skin: Intact, no visible lesions or rashes.  Psych: Good eye contact, normal affect.  Memory intact, not anxious or depressed appearing.    Impression & Recommendations:  Problem # 1:  HYPERLIPIDEMIA (ICD-272.4) Assessment Comment Only  Labs Reviewed: SGOT: 18 (  04/25/2010)   SGPT: 16 (04/25/2010)   HDL:56 (07/29/2010), 45 (04/25/2010)  LDL:140 (07/29/2010), 110 (04/25/2010)  Chol:219 (07/29/2010), 180 (04/25/2010)  Trig:113 (07/29/2010), 123 (04/25/2010) Low fat dietdiscussed and encouraged  Problem # 2:  LOW BACK PAIN, CHRONIC (ICD-724.2) Assessment: Unchanged  The following medications were removed from the medication list:    Indomethacin 50 Mg Caps (Indomethacin) ..... One cap by mouth three times a day Her updated medication list for this problem includes:    Aspir-low 81 Mg  Tbec (Aspirin) ..... One tab by mouth once daily  Problem # 3:  HYPERTENSION (ICD-401.9) Assessment: Deteriorated  The following medications were removed from the medication list:    Tekturna Hct 300-25 Mg Tabs (Aliskiren-hydrochlorothiazide) ..... One tab by mouth once daily    Amlodipine Besylate 10 Mg Tabs (Amlodipine besylate) .Marland Kitchen... Take 1 tablet by mouth once a day , pt to stop amlodipine/benazepril Her updated medication list for this problem includes:    Amlodipine Besy-benazepril Hcl 10-20 Mg Caps (Amlodipine besy-benazepril hcl) .Marland Kitchen... Take 1 capsule by mouth once a day new med to start in place of amlodipine 10mg  for blood pressure  BP today: 160/90 Prior BP: 130/72 (07/30/2010)  Labs Reviewed: K+: 4.4 (07/29/2010) Creat: : 0.70 (07/29/2010)   Chol: 219 (07/29/2010)   HDL: 56 (07/29/2010)   LDL: 140 (07/29/2010)   TG: 113 (07/29/2010)  Orders: Medicare Electronic Prescription [phone])  Complete Medication List: 1)  Multivitamins Tabs (Multiple vitamin) .... One tab by mouth once daily 2)  Flax Seed Oil 1000 Mg Caps (Flaxseed (linseed)) .... One cap by mouth once daily 3)  Calcium Carbonate 600 Mg Tabs (Calcium carbonate) .... One tab by mouth once daily 4)  Fish Oil 1000 Mg Caps (Omega-3 fatty acids) .... One cap by mouth once daily 5)  Allopurinol 300 Mg Tabs (Allopurinol) .... One tab by mouth one every other daily 6)  Klor-con 10 10 Meq Cr-tabs (Potassium chloride) .... Take one daily 7)  Restasis 0.05 % Emul (Cyclosporine) .... One drop each eye bid 8)  Aspir-low 81 Mg Tbec (Aspirin) .... One tab by mouth once daily 9)  Allopurinol 300 Mg Tabs (Allopurinol) .... Take 1 tablet by mouth once a day 10)  Amlodipine Besy-benazepril Hcl 10-20 Mg Caps (Amlodipine besy-benazepril hcl) .... Take 1 capsule by mouth once a day new med to start in place of amlodipine 10mg  for blood pressure  Patient Instructions: 1)  f/u as before. 2)  New med for blood pressure, stop the  amlodipine pls once  you finish this bottle. 3)  Congrats on new regular exercise and weight loss. 4)  Pls remember to eat alot of veg and fruit, drink water. 5)  Labs before next visit Prescriptions: AMLODIPINE BESY-BENAZEPRIL HCL 10-20 MG CAPS (AMLODIPINE BESY-BENAZEPRIL HCL) Take 1 capsule by mouth once a day new med to start in place of amlodipine 10mg  for blood pressure  #30 x 3   Entered and Authorized by:   Syliva Overman MD   Signed by:   Syliva Overman MD on 10/05/2010   Method used:   Printed then faxed to ...       [facility]  [address] (retail)       [address]       [place], [place]  [address]       Ph: [phone]       Fax: [phone]   RxID:   [id number]    Orders Added: 1)  Est. Patient Level IV [81191] 2)  Medicare Electronic Prescription [phone]

## 2010-10-14 NOTE — Letter (Signed)
Summary: medical release   medical release   Imported By: Lind Guest 10/07/2010 08:31:03  _____________________________________________________________________  External Attachment:    Type:   Image     Comment:   External Document

## 2010-10-14 NOTE — Letter (Signed)
Summary: d/c  medicine  d/c  medicine   Imported By: Lind Guest 10/07/2010 08:38:32  _____________________________________________________________________  External Attachment:    Type:   Image     Comment:   External Document

## 2010-11-24 ENCOUNTER — Encounter: Payer: Self-pay | Admitting: Family Medicine

## 2010-11-25 ENCOUNTER — Other Ambulatory Visit: Payer: Self-pay | Admitting: Family Medicine

## 2010-11-26 ENCOUNTER — Encounter: Payer: Self-pay | Admitting: Family Medicine

## 2010-11-26 LAB — HEMOGLOBIN A1C
Hgb A1c MFr Bld: 5.5 % (ref <5.7)
Mean Plasma Glucose: 111 mg/dL (ref <117)

## 2010-11-30 ENCOUNTER — Encounter: Payer: Self-pay | Admitting: Family Medicine

## 2010-11-30 ENCOUNTER — Ambulatory Visit (INDEPENDENT_AMBULATORY_CARE_PROVIDER_SITE_OTHER): Payer: Medicare PPO | Admitting: Family Medicine

## 2010-11-30 DIAGNOSIS — E785 Hyperlipidemia, unspecified: Secondary | ICD-10-CM

## 2010-11-30 DIAGNOSIS — I10 Essential (primary) hypertension: Secondary | ICD-10-CM

## 2010-11-30 DIAGNOSIS — M109 Gout, unspecified: Secondary | ICD-10-CM

## 2010-11-30 MED ORDER — TRIAMTERENE-HCTZ 37.5-25 MG PO TABS: 1.0000 | ORAL_TABLET | Freq: Every day | ORAL | Status: DC

## 2010-11-30 MED ORDER — AMLODIPINE BESYLATE 10 MG PO TABS: 10.0000 mg | ORAL_TABLET | Freq: Every day | ORAL | Status: DC

## 2010-11-30 NOTE — Progress Notes (Signed)
  Subjective:    Patient ID: Kristen Cox, female    DOB: 11-06-1940, 70 y.o.   MRN: [id number] The PT is here for follow up and re-evaluation of chronic medical conditions, medication management and review of recent lab and radiology data.  Preventive health is updated, specifically  Cancer screening, Osteoporosis screening and Immunization.   Questions or concerns regarding consultations or procedures which the PT has had in the interim are  addressed. The PT denies any adverse reactions to current medications since the last visit.reports worsening constipation as well as a tickle in her throat on her BP med She has walks on average 5 days per week and lost 9 pounds since her visit 2 months ago.  Her HBA1C is now normal Hypertension This is a chronic problem. The current episode started more than 1 year ago. The problem has been gradually improving since onset. The problem is uncontrolled. There are no associated agents to hypertension. Risk factors for coronary artery disease include dyslipidemia and post-menopausal state.  Constipation This is a chronic problem. The current episode started more than 1 year ago. The problem has been gradually worsening since onset. Her stool frequency is 1 time per week or less. The patient is not on a high fiber diet. She exercises regularly. There has been adequate water intake. She has tried laxatives and stool softeners for the symptoms. The treatment provided mild relief.      Review of Systems  Gastrointestinal: Positive for constipation.  Denies recent fever or chills. Denies sinus pressure, nasal congestion, ear pain or sore throat. Denies chest congestion, productive cough or wheezing. Denies chest pains, palpitations, paroxysmal nocturnal dyspnea, orthopnea and leg swelling Denies abdominal pain, nausea, vomiting,diarrhea .  Denies rectal bleeding or change in bowel movement. Denies dysuria, frequency, hesitancy or incontinence. Denies joint  pain, swelling and limitation and mobility. Denies headaches, seizure, numbness, or tingling. Denies depression, anxiety or insomnia. Denies skin break down or rash.         Objective:   Physical Exam Patient alert and oriented and in no Cardiopulmonary distress.  HEENT: No facial asymmetry, EOMI, no sinus tenderness, TM's clear, Oropharynx pink and moist.  Neck supple no adenopathy.  Chest: Clear to auscultation bilaterally.  CVS: S1, S2 no murmurs, no S3.  ABD: Soft non tender. Bowel sounds normal.  Ext: No edema  MS: Adequate ROM spine, shoulders, hips and knees.  Skin: Intact, no ulcerations or rash noted.  Psych: Good eye contact, normal affect. Memory intact not anxious or depressed appearing.  CNS: CN 2-12 intact, power, tone and sensation normal throughout.        Assessment & Plan:  1. Hypertension: improved , but still sub optimal, due to intolerance to med, will change and f/u in 2 months Prediabetes: improved greatly with lifestyle change, pt applauded 3.Overweight: improved.Pt encouraged to continue same.

## 2010-11-30 NOTE — Patient Instructions (Addendum)
F/U in 2 months.  You need to stop the amlodipine/benazepril, you have a cough and tickle as a bad reaction to the benazepril, your blood pressure meds are now amlodipine and maxzide.  For constipation, pls take 2 colace pills daily, add raisin bran to your oatmeal, drink at least 48 to 64 ounces of water daily, and every Monday and Thursday, if no bowel movemnt 4 days before, then use a laxative of your choice, eg exlax.  CONGRATS on weight loss, and reversing your trend to becoming a diabetic. PLs keep up healthy eating and exercise program  fasting lipid and chem 7 in 2 months, also uric acid level  You have lost 9 pounds...great  pls increase calcium tablets to one twice daily

## 2010-12-22 ENCOUNTER — Telehealth: Payer: Self-pay | Admitting: Family Medicine

## 2010-12-23 MED ORDER — BENZONATATE 100 MG PO CAPS: 100.0000 mg | ORAL_CAPSULE | Freq: Three times a day (TID) | ORAL | Status: DC | PRN

## 2010-12-23 NOTE — Telephone Encounter (Signed)
Patient called in and states she didn't take this medication this AM she has every other morning.  She said that she coughs so hard at times that she looses her breath.  She did state that she was coughing mucus up.

## 2010-12-23 NOTE — Telephone Encounter (Signed)
i suggest tessalon perles 100mg  one 3 times daily as needed for cough #30 refill zero. Any color to mucus...yellow green , fever or chills, pls let me know I will send in an antibiotic course also. What about nasal drainage ?

## 2010-12-23 NOTE — Telephone Encounter (Signed)
Patient denies fever, chills, or body aches. Med sent, patient aware.

## 2010-12-23 NOTE — Telephone Encounter (Signed)
This is not a known s/e of amlodipine, ask about symptoms of post nasal drainage or reflux pls before the med is stopped, these are more the likely cause, if she has either , will try med for those . i  f not then I would be recommending clonidine as her new bp med, pls let me know the response

## 2011-01-05 NOTE — Op Note (Signed)
NAME:  Kristen Cox, Kristen Cox NO.:  [id number]   MEDICAL RECORD NO.:  [id number]          PATIENT TYPE:  AMB   LOCATION:  DAY                           FACILITY:  [facility]   PHYSICIAN:  R. Roetta Sessions, M.D. DATE OF BIRTH:  01/12/1941   DATE OF PROCEDURE:  04/09/2009  DATE OF DISCHARGE:                               OPERATIVE REPORT   Colonoscopy with snare polypectomy.   INDICATIONS FOR PROCEDURE:  A 70 year old lady who comes for screening  colonoscopy.  She has never had a colonoscopy before.  She did have a  flexible sigmoidoscopy in 2000, by Dr. Ouida Sills which was normal.  There is  no family history colon polyps or colon cancer.  Colonoscopy is now  being done as standard screening maneuver.  The risks, benefits,  alternatives and limitations have been discussed and questions answered.  Please see documentation in the medical record.   PROCEDURE NOTE:  O2 saturation, blood pressure, pulse and respirations  monitored throughout the entire procedure.  Conscious sedation: Versed 5  mg IV and Demerol 100 mg IV in divided doses.  Instrument: Pentax video  chip system.  Cetacaine spray for topical pharyngeal anesthesia.   FINDINGS:  Digital rectal exam revealed no abnormalities.  Endoscopic  findings:  The prep was adequate.  Colon:  The colonic mucosa was  surveyed from the rectosigmoid junction through the left transverse to  the right colon to the appendiceal orifice, ileocecal valve and cecum.  These structures were well seen and photographed for the record.  From  this level, scope was slowly and cautiously withdrawn.  All previously  mentioned mucosal surfaces were again seen.  The patient had a 4 mm flat  polyp in the base the cecum which was cold snared and recovered through  the scope.  The patient had left-sided diverticula.  The remaining  colonic mucosa appeared normal.  The scope was pulled down into the  rectum.  A thorough examination of the rectal mucosa  including  retroflexion of the anal verge demonstrated only a couple of anal  papilla.  The patient tolerated the procedure well.  Cecal withdrawal  time was 14 minutes.   IMPRESSION:  1. Anal papilla otherwise, normal rectum.  2. Left-sided diverticula.  3. Cecal polyp status post cold snare and removal.  4. The remainder of the colonic mucosa appeared normal.   RECOMMENDATIONS:  1. Diverticulosis and polyp literature provided to [name].  2. Follow-up on path.  3. Further recommendations to follow.      Jonathon Bellows, M.D.  Electronically Signed     RMR/MEDQ  D:  04/09/2009  T:  04/09/2009  Job:  [id number]   cc:   Ramon Dredge L. Juanetta Gosling, M.D.  Fax: [phone]

## 2011-01-15 ENCOUNTER — Encounter: Payer: Self-pay | Admitting: Family Medicine

## 2011-01-27 ENCOUNTER — Other Ambulatory Visit: Payer: Self-pay | Admitting: Family Medicine

## 2011-01-28 ENCOUNTER — Encounter: Payer: Self-pay | Admitting: Family Medicine

## 2011-01-28 LAB — LIPID PANEL
HDL: 49 mg/dL (ref >39)
LDL Cholesterol: 119 mg/dL — ABNORMAL HIGH (ref 0–99)
Total CHOL/HDL Ratio: 3.8 Ratio
Triglycerides: 99 mg/dL (ref <150)

## 2011-01-28 LAB — BASIC METABOLIC PANEL
Chloride: 103 mEq/L (ref 96–112)
Creat: 0.67 mg/dL (ref 0.50–1.10)
Sodium: 139 mEq/L (ref 135–145)

## 2011-01-28 LAB — URIC ACID: Uric Acid, Serum: 5.6 mg/dL (ref 2.4–7.0)

## 2011-01-29 ENCOUNTER — Other Ambulatory Visit: Payer: Self-pay

## 2011-01-29 MED ORDER — POTASSIUM CHLORIDE CRYS ER 10 MEQ PO TBCR: 10.0000 meq | EXTENDED_RELEASE_TABLET | Freq: Every day | ORAL | Status: DC

## 2011-02-01 ENCOUNTER — Ambulatory Visit (INDEPENDENT_AMBULATORY_CARE_PROVIDER_SITE_OTHER): Payer: Medicare PPO | Admitting: Family Medicine

## 2011-02-01 ENCOUNTER — Encounter: Payer: Self-pay | Admitting: Family Medicine

## 2011-02-01 VITALS — BP 142/82 | HR 81 | Resp 16 | Ht 63.0 in | Wt 152.1 lb

## 2011-02-01 DIAGNOSIS — I10 Essential (primary) hypertension: Secondary | ICD-10-CM

## 2011-02-01 DIAGNOSIS — E785 Hyperlipidemia, unspecified: Secondary | ICD-10-CM

## 2011-02-01 DIAGNOSIS — R7301 Impaired fasting glucose: Secondary | ICD-10-CM

## 2011-02-01 DIAGNOSIS — Z23 Encounter for immunization: Secondary | ICD-10-CM

## 2011-02-01 MED ORDER — AMLODIPINE BESYLATE 10 MG PO TABS: 10.0000 mg | ORAL_TABLET | Freq: Every day | ORAL | Status: DC

## 2011-02-01 MED ORDER — TRIAMTERENE-HCTZ 37.5-25 MG PO TABS: 1.0000 | ORAL_TABLET | Freq: Every day | ORAL | Status: DC

## 2011-02-01 NOTE — Assessment & Plan Note (Signed)
Systolic value still hgh, no med inc at this time, behav change only , esp reduction in salt intake

## 2011-02-01 NOTE — Assessment & Plan Note (Signed)
Improved, pt applauded on thi and encouraged to continue

## 2011-02-01 NOTE — Assessment & Plan Note (Signed)
Improved, continue behavioral change only, no med

## 2011-02-01 NOTE — Patient Instructions (Addendum)
F/u in 4 months  congrats cholesterol is much better, pls continue your new healthy habits  hBA1C  Mid august, non fasting  Tdap today No med changes at this time,  PLS stop adding salt to food, except use it to season meat, and reduce canned food and sodas, avoid salty snacks eg chips, this will help your blood pressure which is slightly high

## 2011-02-01 NOTE — Progress Notes (Signed)
  Subjective:    Patient ID: Kristen Cox, female    DOB: 1940/10/25, 70 y.o.   MRN: [id number]  HPI HYPERTENSION Disease Monitoring Blood pressure range-120 to 140/80 Chest pain- no      Dyspnea- no Medications Compliance- good, eats a lot of salt reportedly Lightheadedness- no   Edema- no   DIABETES, pt has prediabetes, and has been doing exceptionally well with deitary and lifestyle modification Disease Monitoring Blood Sugar ranges-n/a Polyuria- no New Visual problems- no Medications Compliance- excellent behav change Hypoglycemic symptoms- n/a   HYPERLIPIDEMIA Disease Monitoring See symptoms for Hypertension Medications Compliance- good, lefstyle modificain only  RUQ pain- no  Muscle aches- no  ROS See HPI above   PMH Smoking Status noted     Review of Systems    Denies recent fever or chills. Denies sinus pressure, nasal congestion, ear pain or sore throat. Denies chest congestion, productive cough or wheezing. Denies chest pains, palpitations, paroxysmal nocturnal dyspnea, orthopnea and leg swelling Denies abdominal pain, nausea, vomiting,diarrhea or constipation.  Denies rectal bleeding or change in bowel movement. Denies dysuria, frequency, hesitancy or incontinence. Denies joint pain, swelling and limitation in mobility. Denies headaches, seizure, numbness, or tingling. Denies depression, anxiety or insomnia. Denies skin break down or rash. Pt continues to be diligent with  behav modification with excellent results     Objective:   Physical Exam Patient alert and oriented and in no Cardiopulmonary distress.  HEENT: No facial asymmetry, EOMI, no sinus tenderness, TM's clear, Oropharynx pink and moist.  Neck supple no adenopathy.  Chest: Clear to auscultation bilaterally.  CVS: S1, S2 no murmurs, no S3.  ABD: Soft non tender. Bowel sounds normal.  Ext: No edema  MS: Adequate ROM spine, shoulders, hips and knees.  Skin: Intact, no ulcerations  or rash noted.  Psych: Good eye contact, normal affect. Memory intact not anxious or depressed appearing.  CNS: CN 2-12 intact, power, tone and sensation normal throughout.        Assessment & Plan:

## 2011-03-16 ENCOUNTER — Telehealth: Payer: Self-pay | Admitting: Family Medicine

## 2011-03-17 NOTE — Telephone Encounter (Signed)
pls advise break the HCTZ in half if possible to see if the cramps reduce, if no improvem,ent stop hctz and arrange ov in next 5 to 6 weeks for Bp re-eval with mD, let her know since she wakls regularly and has lost weight hopefully she can do without the HCTZ since it is such a problem

## 2011-03-17 NOTE — Telephone Encounter (Signed)
Ever since she started the HCTZ she has been having terrible cramps at night in her legs and they are so sore the next day she can hardly get out of bed and she didn't have this trouble until she started the HCTZ and she IS taking the potassium with them. Wants to know what to do?

## 2011-03-17 NOTE — Telephone Encounter (Signed)
She is going to try to break it in half. She did it lastnight and didn't have any cramps. Will let us know if any problems

## 2011-05-14 ENCOUNTER — Other Ambulatory Visit: Payer: Self-pay | Admitting: Family Medicine

## 2011-05-29 ENCOUNTER — Other Ambulatory Visit: Payer: Self-pay | Admitting: Family Medicine

## 2011-06-01 ENCOUNTER — Telehealth: Payer: Self-pay | Admitting: Family Medicine

## 2011-06-01 ENCOUNTER — Encounter: Payer: Self-pay | Admitting: Family Medicine

## 2011-06-01 DIAGNOSIS — I10 Essential (primary) hypertension: Secondary | ICD-10-CM

## 2011-06-01 MED ORDER — AMLODIPINE BESYLATE 10 MG PO TABS: 10.0000 mg | ORAL_TABLET | Freq: Every day | ORAL | Status: DC

## 2011-06-01 NOTE — Telephone Encounter (Signed)
Refill sent as requested. 

## 2011-06-03 ENCOUNTER — Ambulatory Visit (INDEPENDENT_AMBULATORY_CARE_PROVIDER_SITE_OTHER): Payer: Medicare PPO | Admitting: Family Medicine

## 2011-06-03 ENCOUNTER — Encounter: Payer: Self-pay | Admitting: Family Medicine

## 2011-06-03 VITALS — BP 140/80 | HR 75 | Resp 16 | Ht 63.0 in | Wt 153.8 lb

## 2011-06-03 DIAGNOSIS — R5381 Other malaise: Secondary | ICD-10-CM

## 2011-06-03 DIAGNOSIS — E785 Hyperlipidemia, unspecified: Secondary | ICD-10-CM

## 2011-06-03 DIAGNOSIS — E119 Type 2 diabetes mellitus without complications: Secondary | ICD-10-CM

## 2011-06-03 DIAGNOSIS — M109 Gout, unspecified: Secondary | ICD-10-CM

## 2011-06-03 DIAGNOSIS — J301 Allergic rhinitis due to pollen: Secondary | ICD-10-CM

## 2011-06-03 DIAGNOSIS — I10 Essential (primary) hypertension: Secondary | ICD-10-CM

## 2011-06-03 DIAGNOSIS — M545 Low back pain, unspecified: Secondary | ICD-10-CM

## 2011-06-03 DIAGNOSIS — R7301 Impaired fasting glucose: Secondary | ICD-10-CM

## 2011-06-03 MED ORDER — LORATADINE 10 MG PO TABS: 10.0000 mg | ORAL_TABLET | Freq: Every day | ORAL | Status: DC

## 2011-06-03 NOTE — Assessment & Plan Note (Signed)
Marked improvement, 30 pound weight loss in 1 year with normalization of HBa1C

## 2011-06-03 NOTE — Assessment & Plan Note (Signed)
Sub optimal control, has been intolerant of additional meds , no change

## 2011-06-03 NOTE — Progress Notes (Signed)
  Subjective:    Patient ID: Kristen Cox, female    DOB: [birth year], 70 y.o.   MRN: [id number]  HPI The PT is here for follow up and re-evaluation of chronic medical conditions, medication management and review of any available recent lab and radiology data.  Preventive health is updated, specifically  Cancer screening and Immunization.   Questions or concerns regarding consultations or procedures which the PT has had in the interim are  addressed. The PT denies any adverse reactions to current medications since the last visit.  There are no new concerns.  There are no specific complaints       Review of Systems Denies recent fever or chills. Denies sinus pressure, nasal congestion, ear pain or sore throat. Denies chest congestion, productive cough or wheezing. Denies chest pains, palpitations and leg swelling Denies abdominal pain, nausea, vomiting,diarrhea or constipation.   Denies dysuria, frequency, hesitancy or incontinence. Denies joint pain, swelling and limitation in mobility. Denies headaches, seizures, numbness, or tingling. Denies depression, anxiety or insomnia. Denies skin break down or rash.        Objective:   Physical Exam Patient alert and oriented and in no cardiopulmonary distress.  HEENT: No facial asymmetry, EOMI, no sinus tenderness,  oropharynx pink and moist.  Neck supple no adenopathy.  Chest: Clear to auscultation bilaterally.  CVS: S1, S2 no murmurs, no S3.  ABD: Soft non tender. Bowel sounds normal.  Ext: No edema  MS: Adequate ROM spine, shoulders, hips and knees.  Skin: Intact, no ulcerations or rash noted.  Psych: Good eye contact, normal affect. Memory intact not anxious or depressed appearing.  CNS: CN 2-12 intact, power, tone and sensation normal throughout.        Assessment & Plan:

## 2011-06-03 NOTE — Patient Instructions (Addendum)
F/u in 4 months  No med changes, except loratidine is sent in for allergies, this is generic claaritin  Your lab shows totally normal blood sugar control, excellent  YOu have lost 30 pounds in the past year  Fasting labs in the past year  Please check for zostavax coverage from your insurance

## 2011-06-03 NOTE — Assessment & Plan Note (Signed)
Improved, denies pain

## 2011-06-03 NOTE — Assessment & Plan Note (Signed)
Improved, dietary management only

## 2011-08-19 ENCOUNTER — Other Ambulatory Visit: Payer: Self-pay | Admitting: Family Medicine

## 2011-08-19 DIAGNOSIS — Z139 Encounter for screening, unspecified: Secondary | ICD-10-CM

## 2011-09-13 ENCOUNTER — Ambulatory Visit (HOSPITAL_COMMUNITY)
Admission: RE | Admit: 2011-09-13 | Discharge: 2011-09-13 | Disposition: A | Discharge status: Home / Self Care | Payer: Medicare Other | Source: Ambulatory Visit | Attending: Family Medicine | Admitting: Family Medicine

## 2011-09-13 DIAGNOSIS — Z139 Encounter for screening, unspecified: Secondary | ICD-10-CM

## 2011-09-13 DIAGNOSIS — Z1231 Encounter for screening mammogram for malignant neoplasm of breast: Secondary | ICD-10-CM | POA: Insufficient documentation

## 2011-09-28 ENCOUNTER — Other Ambulatory Visit: Payer: Self-pay | Admitting: Family Medicine

## 2011-10-01 LAB — LIPID PANEL
HDL: 53 mg/dL (ref >39)
LDL Cholesterol: 123 mg/dL — ABNORMAL HIGH (ref 0–99)
Total CHOL/HDL Ratio: 3.8 Ratio
Triglycerides: 121 mg/dL (ref <150)
VLDL: 24 mg/dL (ref 0–40)

## 2011-10-01 LAB — URIC ACID: Uric Acid, Serum: 4.9 mg/dL (ref 2.4–7.0)

## 2011-10-01 LAB — CBC WITH DIFFERENTIAL/PLATELET
Basophils Absolute: 0 10*3/uL (ref 0.0–0.1)
Eosinophils Relative: 5 % (ref 0–5)
HCT: 36.9 % (ref 36.0–46.0)
Hemoglobin: 12.4 g/dL (ref 12.0–15.0)
Lymphocytes Relative: 41 % (ref 12–46)
MCV: 90.2 fL (ref 78.0–100.0)
Monocytes Absolute: 0.5 10*3/uL (ref 0.1–1.0)
Monocytes Relative: 10 % (ref 3–12)
RDW: 13.3 % (ref 11.5–15.5)
WBC: 5.1 10*3/uL (ref 4.0–10.5)

## 2011-10-01 LAB — BASIC METABOLIC PANEL
BUN: 13 mg/dL (ref 6–23)
Calcium: 10.6 mg/dL — ABNORMAL HIGH (ref 8.4–10.5)
Chloride: 105 mEq/L (ref 96–112)
Creat: 0.71 mg/dL (ref 0.50–1.10)

## 2011-10-01 LAB — HEMOGLOBIN A1C
Hgb A1c MFr Bld: 5.5 % (ref <5.7)
Mean Plasma Glucose: 111 mg/dL (ref <117)

## 2011-10-04 ENCOUNTER — Ambulatory Visit (INDEPENDENT_AMBULATORY_CARE_PROVIDER_SITE_OTHER): Payer: Medicare Other | Admitting: Family Medicine

## 2011-10-04 ENCOUNTER — Encounter: Payer: Self-pay | Admitting: Family Medicine

## 2011-10-04 VITALS — BP 150/80 | HR 85 | Resp 16 | Ht 63.0 in | Wt 156.0 lb

## 2011-10-04 DIAGNOSIS — R7301 Impaired fasting glucose: Secondary | ICD-10-CM

## 2011-10-04 DIAGNOSIS — M549 Dorsalgia, unspecified: Secondary | ICD-10-CM | POA: Insufficient documentation

## 2011-10-04 DIAGNOSIS — I10 Essential (primary) hypertension: Secondary | ICD-10-CM

## 2011-10-04 DIAGNOSIS — E785 Hyperlipidemia, unspecified: Secondary | ICD-10-CM

## 2011-10-04 MED ORDER — OMEPRAZOLE 20 MG PO CPDR: 20.0000 mg | DELAYED_RELEASE_CAPSULE | Freq: Every day | ORAL | Status: DC

## 2011-10-04 MED ORDER — PREDNISONE (PAK) 5 MG PO TABS: 5.0000 mg | ORAL_TABLET | ORAL | Status: DC

## 2011-10-04 MED ORDER — METHYLPREDNISOLONE ACETATE PF 80 MG/ML IJ SUSP
80.0000 mg | Freq: Once | INTRAMUSCULAR | Status: AC
  Administered 2011-10-04: 80 mg via INTRAMUSCULAR

## 2011-10-04 MED ORDER — KETOROLAC TROMETHAMINE 60 MG/2ML IM SOLN
60.0000 mg | Freq: Once | INTRAMUSCULAR | Status: AC
  Administered 2011-10-04: 60 mg via INTRAMUSCULAR

## 2011-10-04 MED ORDER — IBUPROFEN 800 MG PO TABS: ORAL_TABLET | ORAL | Status: DC

## 2011-10-04 NOTE — Assessment & Plan Note (Signed)
Acute flare x 3 days, established severe arthritis and disc disease, toradol and depo medrol and oral medsto start in the am

## 2011-10-04 NOTE — Progress Notes (Signed)
  Subjective:    Patient ID: Kristen Cox, female    DOB: 1941-04-03, 71 y.o.   MRN: [id number]  HPI The PT is here for follow up and re-evaluation of chronic medical conditions, medication management and review of any available recent lab and radiology data.  Preventive health is updated, specifically  Cancer screening and Immunization.   Questions or concerns regarding consultations or procedures which the PT has had in the interim are  addressed. The PT denies any adverse reactions to current medications since the last visit.  3 day h/o severe low back pain, non radiaiting has arthritis and disc disease, flares from time to time. No specific aggravating factor, no incontinence ofn nstool or urine, no lower extremity weakness or numbness      Review of Systems See HPI Denies recent fever or chills. Denies sinus pressure, nasal congestion, ear pain or sore throat. Denies chest congestion, productive cough or wheezing. Denies chest pains, palpitations and leg swelling Denies abdominal pain, nausea, vomiting,diarrhea or constipation.   Denies dysuria, frequency, hesitancy or incontinence. Denies headaches, seizures, numbness, or tingling. Denies depression, anxiety or insomnia. Denies skin break down or rash.        Objective:   Physical Exam  Patient alert and oriented and in no cardiopulmonary distress.Pt in pain  HEENT: No facial asymmetry, EOMI, no sinus tenderness,  oropharynx pink and moist.  Neck supple no adenopathy.  Chest: Clear to auscultation bilaterally.  CVS: S1, S2 no murmurs, no S3.  ABD: Soft non tender. Bowel sounds normal.  Ext: No edema  WU:[id number]  ROM spine, adequate in shoulders, hips and knees.  Skin: Intact, no ulcerations or rash noted.  Psych: Good eye contact, normal affect. Memory intact not anxious or depressed appearing.  CNS: CN 2-12 intact, power, tone and sensation normal throughout.       Assessment & Plan:

## 2011-10-04 NOTE — Patient Instructions (Addendum)
F/u in 6.5 month  You are receiving injections in the office for pain, also medication is sent in to be started in the morning.Please call if symptoms do not improve, you are having a flare of arthritis.Med is also sent in to protect your stomach  Labs show normal blood sugars still , which is great.  Please reduce fried and fatty foods and red meat  Fasting lipid, chem 7, HBa1C in 6.5 month

## 2011-10-04 NOTE — Progress Notes (Signed)
Addended by: Abner Greenspan on: 10/04/2011 08:59 AM   Modules accepted: Orders

## 2011-10-04 NOTE — Assessment & Plan Note (Signed)
Elevated lDl, low fat diet encouraged

## 2011-10-04 NOTE — Assessment & Plan Note (Signed)
Normal blood sugars with weight loss and exercise, walks for 1 hr daily

## 2011-10-04 NOTE — Assessment & Plan Note (Signed)
Elevated at this visit , likely due to uncontrolled pain

## 2011-10-28 ENCOUNTER — Other Ambulatory Visit: Payer: Self-pay | Admitting: Family Medicine

## 2011-11-16 ENCOUNTER — Other Ambulatory Visit: Payer: Self-pay | Admitting: Adult Health

## 2011-11-16 ENCOUNTER — Other Ambulatory Visit (HOSPITAL_COMMUNITY)
Admission: RE | Admit: 2011-11-16 | Discharge: 2011-11-16 | Disposition: A | Discharge status: Home / Self Care | Payer: Medicare Other | Source: Ambulatory Visit | Attending: Obstetrics and Gynecology | Admitting: Obstetrics and Gynecology

## 2011-11-16 DIAGNOSIS — Z124 Encounter for screening for malignant neoplasm of cervix: Secondary | ICD-10-CM | POA: Insufficient documentation

## 2011-11-29 ENCOUNTER — Other Ambulatory Visit: Payer: Self-pay | Admitting: Family Medicine

## 2011-12-27 ENCOUNTER — Other Ambulatory Visit: Payer: Self-pay | Admitting: Family Medicine

## 2012-01-25 ENCOUNTER — Other Ambulatory Visit: Payer: Self-pay | Admitting: Family Medicine

## 2012-03-28 LAB — BASIC METABOLIC PANEL
BUN: 22 mg/dL (ref 6–23)
CO2: 32 mEq/L (ref 19–32)
Calcium: 11.1 mg/dL — ABNORMAL HIGH (ref 8.4–10.5)
Chloride: 101 mEq/L (ref 96–112)
Creat: 0.64 mg/dL (ref 0.50–1.10)

## 2012-03-28 LAB — LIPID PANEL
Cholesterol: 192 mg/dL (ref 0–200)
HDL: 53 mg/dL (ref >39)
Triglycerides: 91 mg/dL (ref <150)

## 2012-04-18 ENCOUNTER — Ambulatory Visit: Payer: Medicare Other | Admitting: Family Medicine

## 2012-04-25 ENCOUNTER — Other Ambulatory Visit: Payer: Self-pay | Admitting: Family Medicine

## 2012-04-27 ENCOUNTER — Other Ambulatory Visit: Payer: Self-pay | Admitting: Family Medicine

## 2012-05-01 ENCOUNTER — Encounter: Payer: Self-pay | Admitting: Family Medicine

## 2012-05-01 ENCOUNTER — Ambulatory Visit (INDEPENDENT_AMBULATORY_CARE_PROVIDER_SITE_OTHER): Payer: Medicare Other | Admitting: Family Medicine

## 2012-05-01 VITALS — BP 140/72 | HR 102 | Resp 18 | Ht 63.0 in | Wt 156.0 lb

## 2012-05-01 DIAGNOSIS — R7989 Other specified abnormal findings of blood chemistry: Secondary | ICD-10-CM

## 2012-05-01 DIAGNOSIS — R42 Dizziness and giddiness: Secondary | ICD-10-CM

## 2012-05-01 DIAGNOSIS — E785 Hyperlipidemia, unspecified: Secondary | ICD-10-CM

## 2012-05-01 DIAGNOSIS — J301 Allergic rhinitis due to pollen: Secondary | ICD-10-CM

## 2012-05-01 DIAGNOSIS — E215 Disorder of parathyroid gland, unspecified: Secondary | ICD-10-CM

## 2012-05-01 DIAGNOSIS — I10 Essential (primary) hypertension: Secondary | ICD-10-CM

## 2012-05-01 MED ORDER — MECLIZINE HCL 12.5 MG PO TABS: 12.5000 mg | ORAL_TABLET | Freq: Three times a day (TID) | ORAL | Status: DC | PRN

## 2012-05-01 MED ORDER — FLUTICASONE PROPIONATE 50 MCG/ACT NA SUSP: 2.0000 | Freq: Every day | NASAL | Status: DC

## 2012-05-01 MED ORDER — CETIRIZINE HCL 10 MG PO TBDP: 1.0000 | ORAL_TABLET | Freq: Every day | ORAL | Status: DC

## 2012-05-01 MED ORDER — TRIAMTERENE-HCTZ 37.5-25 MG PO TABS: 1.0000 | ORAL_TABLET | Freq: Every day | ORAL | Status: DC

## 2012-05-01 NOTE — Patient Instructions (Addendum)
F/u in mid January.Call if you need me before  PTH today, due to high calcium and cramps  New allergy medication, zyrtec and flonase, stop claritin  Ok to take sudafed one daily for 3 to 5 days for excess drainage.  Meclizine sent in in case you have another episode of vertigo

## 2012-05-07 NOTE — Assessment & Plan Note (Signed)
Slightly elevated BP at this visit. No med change DASH diet and commitment to daily physical activity for a minimum of 30 minutes discussed and encouraged, as a part of hypertension management. The importance of attaining a healthy weight is also discussed.

## 2012-05-07 NOTE — Assessment & Plan Note (Signed)
Uncontrolled, med change to include nasal spray also

## 2012-05-07 NOTE — Assessment & Plan Note (Signed)
[  pt educated about the illness and meclizine prescribed for as needed use, if persists or worsens will refer to ENT

## 2012-05-07 NOTE — Assessment & Plan Note (Signed)
Cramps, hypercalcemia and elevated PTH, refer to endo

## 2012-05-07 NOTE — Assessment & Plan Note (Signed)
LDL only elevated, counseled pt re the need to lower fried and fatty food intake. No med at this  time

## 2012-05-07 NOTE — Progress Notes (Signed)
  Subjective:    Patient ID: Kristen Cox, female    DOB: Nov 27, 1940, 71 y.o.   MRN: [id number]  HPI The PT is here for follow up and re-evaluation of chronic medical conditions, medication management and review of any available recent lab and radiology data.  Preventive health is updated, specifically  Cancer screening and Immunization.   Questions or concerns regarding consultations or procedures which the PT has had in the interim are  addressed. The PT denies any adverse reactions to current medications since the last visit.  C/o uncontrolled allergy symptoms with nasal congestion, drainage and excessive sneezing, also for the first time ever, she experienced vertigo in recent time. Current less symptomatic but still has  intermittent symptoms        Review of Systems See HPI Denies recent fever or chills. Denies ear pain or sore throat. Denies chest congestion, productive cough or wheezing. Denies chest pains, palpitations and leg swelling Denies abdominal pain, nausea, vomiting,diarrhea or constipation.   Denies dysuria, frequency, hesitancy or incontinence. Denies joint pain, swelling and limitation in mobility. Denies headaches, seizures, numbness, or tingling. Denies depression, anxiety or insomnia. Denies skin break down or rash.        Objective:   Physical Exam  Patient alert and oriented and in no cardiopulmonary distress.  HEENT: No facial asymmetry, EOMI, no sinus tenderness,  oropharynx pink and moist.  Neck supple no adenopathy.Nasal mucosa erythematous and edematous, no nystagmus  Chest: Clear to auscultation bilaterally.  CVS: S1, S2 no murmurs, no S3.  ABD: Soft non tender. Bowel sounds normal.  Ext: No edema  MS: Adequate ROM spine, shoulders, hips and knees.  Skin: Intact, no ulcerations or rash noted.  Psych: Good eye contact, normal affect. Memory intact not anxious or depressed appearing.  CNS: CN 2-12 intact, power, tone and sensation  normal throughout.       Assessment & Plan:

## 2012-06-27 ENCOUNTER — Other Ambulatory Visit: Payer: Self-pay

## 2012-06-27 ENCOUNTER — Telehealth: Payer: Self-pay | Admitting: Family Medicine

## 2012-06-27 DIAGNOSIS — M549 Dorsalgia, unspecified: Secondary | ICD-10-CM

## 2012-06-27 MED ORDER — IBUPROFEN 800 MG PO TABS: ORAL_TABLET | ORAL | Status: DC

## 2012-06-27 NOTE — Telephone Encounter (Signed)
Is it ok to refill this?  

## 2012-06-27 NOTE — Telephone Encounter (Signed)
Med refilled.

## 2012-06-27 NOTE — Telephone Encounter (Signed)
Refill x 1 ok , pls do and let her know

## 2012-07-25 ENCOUNTER — Other Ambulatory Visit: Payer: Self-pay | Admitting: Family Medicine

## 2012-08-17 ENCOUNTER — Other Ambulatory Visit: Payer: Self-pay

## 2012-08-17 ENCOUNTER — Other Ambulatory Visit: Payer: Self-pay | Admitting: Family Medicine

## 2012-08-17 ENCOUNTER — Telehealth: Payer: Self-pay | Admitting: Family Medicine

## 2012-08-17 MED ORDER — ATENOLOL 50 MG PO TABS: ORAL_TABLET | ORAL | Status: DC

## 2012-08-17 MED ORDER — ATENOLOL 50 MG PO TABS: 50.0000 mg | ORAL_TABLET | Freq: Every day | ORAL | Status: DC

## 2012-08-17 NOTE — Telephone Encounter (Signed)
pls send atenolol as ordered to her pharmacy

## 2012-08-17 NOTE — Telephone Encounter (Signed)
Two months only, pt needs to keep January appt I was unable to leave a message pls fax script andtry to contact her again re the importance of keeping Jan appt

## 2012-08-17 NOTE — Telephone Encounter (Signed)
Not on med list. Should she be on this?  

## 2012-08-18 ENCOUNTER — Other Ambulatory Visit: Payer: Self-pay

## 2012-08-18 MED ORDER — ATENOLOL 50 MG PO TABS: 50.0000 mg | ORAL_TABLET | Freq: Every day | ORAL | Status: DC

## 2012-08-18 NOTE — Telephone Encounter (Signed)
Patient aware.

## 2012-08-28 ENCOUNTER — Other Ambulatory Visit: Payer: Self-pay | Admitting: Family Medicine

## 2012-09-04 ENCOUNTER — Other Ambulatory Visit: Payer: Self-pay | Admitting: Family Medicine

## 2012-09-04 DIAGNOSIS — Z139 Encounter for screening, unspecified: Secondary | ICD-10-CM

## 2012-09-06 ENCOUNTER — Encounter: Payer: Self-pay | Admitting: Family Medicine

## 2012-09-06 ENCOUNTER — Ambulatory Visit (INDEPENDENT_AMBULATORY_CARE_PROVIDER_SITE_OTHER): Payer: Medicare Other | Admitting: Family Medicine

## 2012-09-06 VITALS — BP 150/70 | HR 70 | Resp 16 | Ht 63.0 in | Wt 154.8 lb

## 2012-09-06 DIAGNOSIS — M109 Gout, unspecified: Secondary | ICD-10-CM

## 2012-09-06 DIAGNOSIS — I10 Essential (primary) hypertension: Secondary | ICD-10-CM

## 2012-09-06 DIAGNOSIS — R7301 Impaired fasting glucose: Secondary | ICD-10-CM

## 2012-09-06 DIAGNOSIS — J301 Allergic rhinitis due to pollen: Secondary | ICD-10-CM

## 2012-09-06 DIAGNOSIS — R5383 Other fatigue: Secondary | ICD-10-CM

## 2012-09-06 DIAGNOSIS — R5381 Other malaise: Secondary | ICD-10-CM

## 2012-09-06 DIAGNOSIS — E785 Hyperlipidemia, unspecified: Secondary | ICD-10-CM

## 2012-09-06 NOTE — Patient Instructions (Addendum)
Annual wellness in 4.5 month  Continue healthy lifestyle and cut down on salt.  Eat a lot of fresh fruit and vegetables please, and drink water The medication for gout, allopurinol, is to be discontinued. Take 1 tablet 3 days this week, then next week take one tablet for 2 days, then stop. Call if you have a flare of gout please.  HBA1C, TSH, uric acid  And CBC in February  When you are getting labs for Dr.Befakado, ask them to draw these labs as well.  Check the pharmacy for the cost of the shingles vaccine, and plan to get it there if you decide to take it. You should get it  Five months of amlodipine,and atenolol will be sent in today

## 2012-09-07 NOTE — Progress Notes (Signed)
  Subjective:    Patient ID: Kristen Cox, female    DOB: May 05, 1941, 72 y.o.   MRN: [id number]  HPI  The PT is here for follow up and re-evaluation of chronic medical conditions, medication management and review of any available recent lab and radiology data.  Preventive health is updated, specifically  Cancer screening and Immunization.   Questions or concerns regarding consultations or procedures which the PT has had in the interim are  addressed. The PT denies any adverse reactions to current medications since the last visit.  There are no new concerns.  There are no specific complaints      Review of Systems See HPI Denies recent fever or chills. Denies sinus pressure, nasal congestion, ear pain or sore throat. Denies chest congestion, productive cough or wheezing. Denies chest pains, palpitations and leg swelling Denies abdominal pain, nausea, vomiting,diarrhea or constipation.   Denies dysuria, frequency, hesitancy or incontinence. Denies joint pain, swelling and limitation in mobility. Denies headaches, seizures, numbness, or tingling. Denies depression, anxiety or insomnia. Denies skin break down or rash.        Objective:   Physical Exam Patient alert and oriented and in no cardiopulmonary distress.  HEENT: No facial asymmetry, EOMI, no sinus tenderness,  oropharynx pink and moist.  Neck supple no adenopathy.  Chest: Clear to auscultation bilaterally.  CVS: S1, S2 no murmurs, no S3.  ABD: Soft non tender. Bowel sounds normal.  Ext: No edema  MS: Adequate ROM spine, shoulders, hips and knees.  Skin: Intact, no ulcerations or rash noted.  Psych: Good eye contact, normal affect. Memory intact not anxious or depressed appearing.  CNS: CN 2-12 intact, power, tone and sensation normal throughout.        Assessment & Plan:

## 2012-09-07 NOTE — Assessment & Plan Note (Signed)
Excellent lifestyle change with marked improvement Updated HBa1C next month Patient educated about the importance of limiting  Carbohydrate intake , the need to commit to daily physical activity for a minimum of 30 minutes , and to commit weight loss. The fact that changes in all these areas will reduce or eliminate all together the development of diabetes is stressed.

## 2012-09-07 NOTE — Assessment & Plan Note (Signed)
No flare in over 2 years, taper off allopurinol Check uric acid level in Feb

## 2012-09-07 NOTE — Assessment & Plan Note (Signed)
Sub optimal control, however has CKD, will defer further med adjustment to nephrology who sees her next month DASH diet and commitment to daily physical activity for a minimum of 30 minutes discussed and encouraged, as a part of hypertension management. The importance of attaining a healthy weight is also discussed.

## 2012-09-07 NOTE — Assessment & Plan Note (Signed)
Controlled, no change in medication  

## 2012-09-19 ENCOUNTER — Ambulatory Visit (HOSPITAL_COMMUNITY)
Admission: RE | Admit: 2012-09-19 | Discharge: 2012-09-19 | Disposition: A | Discharge status: Home / Self Care | Payer: Medicare Other | Source: Ambulatory Visit | Attending: Family Medicine | Admitting: Family Medicine

## 2012-09-19 DIAGNOSIS — Z1231 Encounter for screening mammogram for malignant neoplasm of breast: Secondary | ICD-10-CM | POA: Insufficient documentation

## 2012-09-19 DIAGNOSIS — Z139 Encounter for screening, unspecified: Secondary | ICD-10-CM

## 2012-10-12 ENCOUNTER — Other Ambulatory Visit: Payer: Self-pay | Admitting: Family Medicine

## 2012-10-12 ENCOUNTER — Telehealth: Payer: Self-pay | Admitting: Family Medicine

## 2012-10-12 MED ORDER — ATENOLOL 50 MG PO TABS: 50.0000 mg | ORAL_TABLET | Freq: Every day | ORAL | Status: DC

## 2012-10-12 NOTE — Telephone Encounter (Signed)
Sent back

## 2012-10-26 ENCOUNTER — Other Ambulatory Visit: Payer: Self-pay | Admitting: Family Medicine

## 2012-12-25 ENCOUNTER — Other Ambulatory Visit: Payer: Self-pay | Admitting: Family Medicine

## 2013-01-18 ENCOUNTER — Telehealth: Payer: Self-pay | Admitting: Family Medicine

## 2013-01-18 NOTE — Telephone Encounter (Signed)
Noted  

## 2013-01-18 NOTE — Telephone Encounter (Signed)
Changed pharmacy in system to [facility]

## 2013-01-24 ENCOUNTER — Other Ambulatory Visit: Payer: Self-pay

## 2013-01-24 ENCOUNTER — Telehealth: Payer: Self-pay | Admitting: Family Medicine

## 2013-01-24 DIAGNOSIS — M549 Dorsalgia, unspecified: Secondary | ICD-10-CM

## 2013-01-24 DIAGNOSIS — R42 Dizziness and giddiness: Secondary | ICD-10-CM

## 2013-01-24 MED ORDER — MECLIZINE HCL 12.5 MG PO TABS: 12.5000 mg | ORAL_TABLET | Freq: Three times a day (TID) | ORAL | Status: DC | PRN

## 2013-01-24 MED ORDER — AMLODIPINE BESYLATE 10 MG PO TABS: ORAL_TABLET | ORAL | Status: DC

## 2013-01-24 MED ORDER — ATENOLOL 50 MG PO TABS: 50.0000 mg | ORAL_TABLET | Freq: Every day | ORAL | Status: DC

## 2013-01-24 MED ORDER — IBUPROFEN 800 MG PO TABS: ORAL_TABLET | ORAL | Status: DC

## 2013-01-24 MED ORDER — POTASSIUM CHLORIDE CRYS ER 10 MEQ PO TBCR: EXTENDED_RELEASE_TABLET | ORAL | Status: DC

## 2013-01-24 NOTE — Telephone Encounter (Signed)
Spoke with patient and clarified what rx she needs.

## 2013-02-06 ENCOUNTER — Encounter: Payer: Self-pay | Admitting: Family Medicine

## 2013-02-06 ENCOUNTER — Ambulatory Visit (INDEPENDENT_AMBULATORY_CARE_PROVIDER_SITE_OTHER): Payer: Medicare Other | Admitting: Family Medicine

## 2013-02-06 VITALS — BP 160/82 | HR 70 | Resp 16 | Ht 63.0 in | Wt 156.4 lb

## 2013-02-06 DIAGNOSIS — M549 Dorsalgia, unspecified: Secondary | ICD-10-CM

## 2013-02-06 DIAGNOSIS — Z0000 Encounter for general adult medical examination without abnormal findings: Secondary | ICD-10-CM

## 2013-02-06 MED ORDER — KETOROLAC TROMETHAMINE 60 MG/2ML IM SOLN
60.0000 mg | Freq: Once | INTRAMUSCULAR | Status: AC
  Administered 2013-02-06: 60 mg via INTRAMUSCULAR

## 2013-02-06 MED ORDER — METHYLPREDNISOLONE ACETATE 80 MG/ML IJ SUSP
80.0000 mg | Freq: Once | INTRAMUSCULAR | Status: AC
  Administered 2013-02-06: 80 mg via INTRAMUSCULAR

## 2013-02-06 MED ORDER — PREDNISONE 5 MG PO TABS: 5.0000 mg | ORAL_TABLET | Freq: Two times a day (BID) | ORAL | Status: AC

## 2013-02-06 NOTE — Patient Instructions (Signed)
F/u in 4 month, rectal at that visit, call if you need me before  You will; need labs , I will see what you just had with Dr Alferd Apa  Your exam is good, except for blood pressure being slightly elevated  Toradol and depo medrol in office for right SI pain  Prednisone for 5 days only will be sent in.  I apologize re confusion with your appointment

## 2013-02-06 NOTE — Progress Notes (Signed)
Subjective:    Patient ID: Kristen Cox, female    DOB: 02-06-1941, 72 y.o.   MRN: [id number]  HPI Preventive Screening-Counseling & Management   Patient present here today for a Medicare annual wellness visit. C/o 3 week h/o severe localized right hip pain, worse with direct pressure and non radiating Has had this in the past and benefited greatly from anti inflammatory injections   Current Problems (verified)   Medications Prior to Visit Allergies (verified)   PAST HISTORY  Family History: 9 siblings all deceased, both parents had strokes  Social History : Widow since 45 years, Husband died when her son was  48 month old. 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 No  Over the past two weeks, have you felt little interest or pleasure in doing things? No  Have you lost interest or pleasure in daily life? No  Do you often feel hopeless? No  Do you cry easily over simple problems? No   Activities of Daily Living  In your present state of health, do you have any difficulty performing the following activities?  Driving?: No Managing money?: No Feeding yourself?:No Getting from bed to chair?:No Climbing a flight of stairs?:No Preparing food and eating?:No Bathing or showering?:No Getting dressed?:No Getting to the toilet?:No Using the toilet?:No Moving around from place to place?: No  Fall Risk Assessment In the past year  have you fallen or had a near fall?:No Are you currently taking any medications that make you dizzy?:No   Hearing Difficulties: No Do you often ask people to speak up or repeat themselves?:No Do you experience ringing or noises in your ears?:No Do you have difficulty understanding soft or whispered voices?:No  Cognitive Testing  Alert? Yes Normal Appearance?Yes  Oriented to person? Yes Place? Yes  Time? Yes  Displays appropriate judgment?Yes  Can read the correct time from a watch face? yes Are you having problems remembering things?No  Advanced Directives have been discussed with the patient?Yes ,full code   List the Names of Other Physician/Practitioners you currently use: Dr Charise Killian, Dr Alferd Apa, family tree   Indicate any recent Medical Services you may have received from other than [facility] providers in the past year (date may be approximate).   Assessment:    Annual Wellness Exam   Plan:    During the course of the visit the patient was educated and counseled about appropriate screening and preventive services including:  A healthy diet is rich in fruit, vegetables and whole grains. Poultry fish, nuts and beans are a healthy choice for protein rather then red meat. A low sodium diet and drinking 64 ounces of water daily is generally recommended. Oils and sweet should be limited. Carbohydrates especially for those who are diabetic or overweight, should be limited to 30-45 gram per meal. It is important to eat on a regular schedule, at least 3 times daily. Snacks should be primarily  fruits, vegetables or nuts. It is important that you exercise regularly at least 30 minutes 5 times a week. If you develop chest pain, have severe difficulty breathing, or feel very tired, stop exercising immediately and seek medical attention  Immunization reviewed and updated. Cancer screening reviewed and updated    Patient Instructions (the written plan) was given to the patient.  Medicare  Attestation  I have personally reviewed:  The patient's medical and social history  Their use of alcohol, tobacco or illicit drugs  Their current medications and supplements  The patient's functional ability including ADLs,fall risks, home safety risks, cognitive, and hearing and visual impairment  Diet and physical activities  Evidence for depression or mood disorders  The patient's weight, height, BMI, and visual acuity have been recorded in the chart. I have made referrals, counseling, and provided education to the patient based on review of the above and I have provided the patient with a written personalized care plan for preventive services.      Review of Systems     Objective:   Physical Exam  Pleasant female alert and oriented in no CP distress.Pt in pain with movement  MS: tender to palpation over right SI joint , with limitation in mobility of lower back and right hip due to pain      Assessment & Plan:

## 2013-02-06 NOTE — Assessment & Plan Note (Signed)
Annual wellness as documented.  Very functional pt with no limitations in ADL's and healthy lifestyle. Though she essentially has uniocular vision, she is safely able to drive. No concerns or changes in management at this time. Will f/u blood pressure next visit, she should be finishing with the nephrologist, she states this August

## 2013-03-08 ENCOUNTER — Ambulatory Visit (INDEPENDENT_AMBULATORY_CARE_PROVIDER_SITE_OTHER): Payer: Medicare Other | Admitting: Family Medicine

## 2013-03-08 VITALS — BP 170/80 | HR 60 | Resp 16 | Wt 154.8 lb

## 2013-03-08 DIAGNOSIS — I10 Essential (primary) hypertension: Secondary | ICD-10-CM

## 2013-03-08 DIAGNOSIS — M4800 Spinal stenosis, site unspecified: Secondary | ICD-10-CM

## 2013-03-08 MED ORDER — TRAMADOL HCL 50 MG PO TABS: ORAL_TABLET | ORAL | Status: AC

## 2013-03-08 NOTE — Progress Notes (Signed)
  Subjective:    Patient ID: Kristen Cox, female    DOB: 04-Apr-1941, 72 y.o.   MRN: [id number]  HPI 4 weeks ago pt statrted having acute sciatica, early response to medication , but after approx 2 weeks, symptoms have returned, pain is unrelenting with activity, right buttock to lof.Denies red flag symptoms. Wants relief, activity is limited   Review of Systems See HPI Denies recent fever or chills.  Denies chest pains, palpitations and leg swelling Denies abdominal pain, nausea, vomiting,diarrhea or constipation.   Denies dysuria, frequency, hesitancy or incontinence. Denies urinary or fecal ncontinence, numbness, or tingling. Denies depression, anxiety or insomnia. Denies skin break down or rash.        Objective:   Physical Exam  Patient alert and oriented and in no cardiopulmonary distress.Pt in pain  HEENT: No facial asymmetry, EOMI, no sinus tenderness,  oropharynx pink and moist.  Neck supple no adenopathy.  Chest: Clear to auscultation bilaterally.  CVS: S1, S2 no murmurs, no S3.  ABD: Soft non tender. Bowel sounds normal.  Ext: No edema  MS: decreased ROM spine,adequate in  shoulders, hips and knees.  CNS: CN 2-12 intact, power, tone and sensation normal throughout.       Assessment & Plan:

## 2013-03-08 NOTE — Patient Instructions (Addendum)
F/u as before  You are referred  for epidural injection for uncontrolled sciatica, we will contact you with the appointment which should be within the next week   Blood pressure slightly elevated at this visit, but no changes in medication dose are being made at this time

## 2013-03-09 ENCOUNTER — Other Ambulatory Visit: Payer: Self-pay | Admitting: Family Medicine

## 2013-03-09 DIAGNOSIS — M549 Dorsalgia, unspecified: Secondary | ICD-10-CM

## 2013-03-10 ENCOUNTER — Encounter: Payer: Self-pay | Admitting: Family Medicine

## 2013-03-10 NOTE — Assessment & Plan Note (Signed)
Elevated at this visit,moreso than usual, will not change meds, pt in severe pain

## 2013-03-10 NOTE — Assessment & Plan Note (Signed)
Uncontrolled pain symtoms, has benefited in the past from epidural, will be referred fo same Tramdol for pain in the interim, low dose

## 2013-03-16 ENCOUNTER — Ambulatory Visit
Admission: RE | Admit: 2013-03-16 | Discharge: 2013-03-16 | Disposition: A | Discharge status: Home / Self Care | Payer: Medicare Other | Source: Ambulatory Visit | Attending: Family Medicine | Admitting: Family Medicine

## 2013-03-16 DIAGNOSIS — M549 Dorsalgia, unspecified: Secondary | ICD-10-CM

## 2013-03-16 MED ORDER — METHYLPREDNISOLONE ACETATE 40 MG/ML INJ SUSP (RADIOLOG
120.0000 mg | Freq: Once | INTRAMUSCULAR | Status: AC
  Administered 2013-03-16: 120 mg via EPIDURAL

## 2013-03-16 MED ORDER — IOHEXOL 180 MG/ML  SOLN
1.0000 mL | Freq: Once | INTRAMUSCULAR | Status: AC | PRN
  Administered 2013-03-16: 1 mL via EPIDURAL

## 2013-05-15 ENCOUNTER — Encounter: Payer: Self-pay | Admitting: Family Medicine

## 2013-05-15 ENCOUNTER — Ambulatory Visit (INDEPENDENT_AMBULATORY_CARE_PROVIDER_SITE_OTHER): Payer: Medicare Other | Admitting: Family Medicine

## 2013-05-15 ENCOUNTER — Other Ambulatory Visit: Payer: Self-pay | Admitting: Family Medicine

## 2013-05-15 ENCOUNTER — Encounter: Payer: Self-pay | Admitting: Internal Medicine

## 2013-05-15 VITALS — BP 148/80 | HR 66 | Resp 16 | Wt 154.1 lb

## 2013-05-15 DIAGNOSIS — M4800 Spinal stenosis, site unspecified: Secondary | ICD-10-CM

## 2013-05-15 DIAGNOSIS — K5900 Constipation, unspecified: Secondary | ICD-10-CM | POA: Insufficient documentation

## 2013-05-15 DIAGNOSIS — R1903 Right lower quadrant abdominal swelling, mass and lump: Secondary | ICD-10-CM

## 2013-05-15 DIAGNOSIS — I10 Essential (primary) hypertension: Secondary | ICD-10-CM

## 2013-05-15 DIAGNOSIS — M549 Dorsalgia, unspecified: Secondary | ICD-10-CM

## 2013-05-15 DIAGNOSIS — Z1211 Encounter for screening for malignant neoplasm of colon: Secondary | ICD-10-CM

## 2013-05-15 LAB — COMPREHENSIVE METABOLIC PANEL
ALT: 12 U/L (ref 0–35)
AST: 22 U/L (ref 0–37)
Albumin: 4 g/dL (ref 3.5–5.2)
Alkaline Phosphatase: 64 U/L (ref 39–117)
BUN: 17 mg/dL (ref 6–23)
CO2: 31 mEq/L (ref 19–32)
Chloride: 107 mEq/L (ref 96–112)
Creat: 0.72 mg/dL (ref 0.50–1.10)
Glucose, Bld: 98 mg/dL (ref 70–99)
Potassium: 4.8 mEq/L (ref 3.5–5.3)
Total Bilirubin: 0.5 mg/dL (ref 0.3–1.2)

## 2013-05-15 LAB — CBC
HCT: 39.5 % (ref 36.0–46.0)
Hemoglobin: 13.4 g/dL (ref 12.0–15.0)
MCH: 29.9 pg (ref 26.0–34.0)
MCHC: 33.9 g/dL (ref 30.0–36.0)
MCV: 88.2 fL (ref 78.0–100.0)
Platelets: 271 10*3/uL (ref 150–400)
RDW: 13.8 % (ref 11.5–15.5)
WBC: 6.4 10*3/uL (ref 4.0–10.5)

## 2013-05-15 LAB — POC HEMOCCULT BLD/STL (OFFICE/1-CARD/DIAGNOSTIC)

## 2013-05-15 MED ORDER — METHYLPREDNISOLONE ACETATE 80 MG/ML IJ SUSP
80.0000 mg | Freq: Once | INTRAMUSCULAR | Status: AC
  Administered 2013-05-15: 80 mg via INTRAMUSCULAR

## 2013-05-15 MED ORDER — AMLODIPINE BESYLATE 10 MG PO TABS: ORAL_TABLET | ORAL | Status: DC

## 2013-05-15 MED ORDER — POTASSIUM CHLORIDE CRYS ER 10 MEQ PO TBCR: EXTENDED_RELEASE_TABLET | ORAL | Status: DC

## 2013-05-15 MED ORDER — ATENOLOL 50 MG PO TABS: 50.0000 mg | ORAL_TABLET | Freq: Every day | ORAL | Status: DC

## 2013-05-15 MED ORDER — KETOROLAC TROMETHAMINE 60 MG/2ML IM SOLN
60.0000 mg | Freq: Once | INTRAMUSCULAR | Status: AC
  Administered 2013-05-15: 60 mg via INTRAMUSCULAR

## 2013-05-15 NOTE — Progress Notes (Signed)
  Subjective:    Patient ID: Kristen Cox, female    DOB: [birth year], 72 y.o.   MRN: [id number]  HPI Wore heels yesterday to a funeral , since then she has had increased  right groin pain, she has had rigth groin pain since the epidural, states the left leg pain is good following epidural, but new since the epidural is right groin pain Lying on the left  Side is extremely painful has an 8 plus right groin pain C/o painful swelling "knot" in right lower abdomen  approx 2 weeks, denies change in BM, but dependes on MOM weekly for the past 6 months, and has once weekly BM following this. Apettite and weight are unchanged on review   Review of Systems See HPI Denies recent fever or chills. Denies sinus pressure, nasal congestion, ear pain or sore throat. Denies chest congestion, productive cough or wheezing. Denies chest pains, palpitations and leg swelling  Denies dysuria, frequency, hesitancy or incontinence. Denies headaches, seizures, numbness, or tingling. Denies depression, anxiety or insomnia. Denies skin break down or rash.        Objective:   Physical Exam  Patient alert and oriented and in no cardiopulmonary distress.  HEENT: No facial asymmetry, EOMI, no sinus tenderness,  oropharynx pink and moist.  Neck supple no adenopathy.  Chest: Clear to auscultation bilaterally.  CVS: S1, S2 no murmurs, no S3.  ABD: Soft bilateral lower quadrant tenderness to deep palpation, right greater than left, RLQ mass questionable, normal bowel sounds Rectal : no mass , heme negative stool Ext: No edema  MS: decreased  ROM spine,  hips and knees.  Skin: Intact, no ulcerations or rash noted.  Psych: Good eye contact, normal affect. Memory intact not anxious or depressed appearing.  CNS: CN 2-12 intact, power, tone and sensation normal throughout.       Assessment & Plan:

## 2013-05-15 NOTE — Patient Instructions (Addendum)
F/u in 5.5 weeks , call if you need me before  You are being referred for abdominal and pelvic scan with contrast to evaluate right lower abdominal pain and swelling  You are referred to dr Jena Gauss due to new complaint of constipation x 6 months and abdominal pain    You are referred for MRI low back to evaluate constant right groin pain   cBC and cmp today  Toradol 60 mg and depo medrol 80 mg Im for uncontrolled pain in right groin

## 2013-05-18 ENCOUNTER — Ambulatory Visit (HOSPITAL_COMMUNITY): Payer: Medicare Other

## 2013-05-20 NOTE — Assessment & Plan Note (Signed)
Groin pain despite recent epidural; is severe, needs update MRI of spine , referral entered

## 2013-05-20 NOTE — Assessment & Plan Note (Signed)
C/o abdominal mass and pain  With change in BM, referred for imaging as well as to GI Rectal exam today is negative for mass and stool is heme negative

## 2013-05-20 NOTE — Assessment & Plan Note (Signed)
Controlled, no change in medication  

## 2013-05-20 NOTE — Assessment & Plan Note (Signed)
Increased and uncontrolled right groin pain radiating from low back, toradol and depo medrol in office and MRI of spine

## 2013-05-20 NOTE — Assessment & Plan Note (Signed)
New complaint, with c/o abdominal pain and palable mass Gi to evaluate

## 2013-05-21 ENCOUNTER — Ambulatory Visit (HOSPITAL_COMMUNITY)
Admission: RE | Admit: 2013-05-21 | Discharge: 2013-05-21 | Disposition: A | Discharge status: Home / Self Care | Payer: Medicare Other | Source: Ambulatory Visit | Attending: Family Medicine | Admitting: Family Medicine

## 2013-05-21 ENCOUNTER — Other Ambulatory Visit: Payer: Self-pay | Admitting: Family Medicine

## 2013-05-21 ENCOUNTER — Encounter (HOSPITAL_COMMUNITY): Payer: Self-pay

## 2013-05-21 DIAGNOSIS — M4800 Spinal stenosis, site unspecified: Secondary | ICD-10-CM

## 2013-05-21 DIAGNOSIS — M48061 Spinal stenosis, lumbar region without neurogenic claudication: Secondary | ICD-10-CM | POA: Insufficient documentation

## 2013-05-21 DIAGNOSIS — M545 Low back pain, unspecified: Secondary | ICD-10-CM | POA: Insufficient documentation

## 2013-05-21 DIAGNOSIS — R1904 Left lower quadrant abdominal swelling, mass and lump: Secondary | ICD-10-CM | POA: Insufficient documentation

## 2013-05-21 DIAGNOSIS — R1903 Right lower quadrant abdominal swelling, mass and lump: Secondary | ICD-10-CM

## 2013-05-21 DIAGNOSIS — K5730 Diverticulosis of large intestine without perforation or abscess without bleeding: Secondary | ICD-10-CM | POA: Insufficient documentation

## 2013-05-21 DIAGNOSIS — M51379 Other intervertebral disc degeneration, lumbosacral region without mention of lumbar back pain or lower extremity pain: Secondary | ICD-10-CM | POA: Insufficient documentation

## 2013-05-21 DIAGNOSIS — N9489 Other specified conditions associated with female genital organs and menstrual cycle: Secondary | ICD-10-CM

## 2013-05-21 DIAGNOSIS — R1031 Right lower quadrant pain: Secondary | ICD-10-CM | POA: Insufficient documentation

## 2013-05-21 DIAGNOSIS — M5137 Other intervertebral disc degeneration, lumbosacral region: Secondary | ICD-10-CM | POA: Insufficient documentation

## 2013-05-21 MED ORDER — SODIUM CHLORIDE 0.9 % IJ SOLN
INTRAMUSCULAR | Status: AC
  Filled 2013-05-21: qty 400

## 2013-05-21 MED ORDER — IOHEXOL 300 MG/ML  SOLN
100.0000 mL | Freq: Once | INTRAMUSCULAR | Status: AC | PRN
  Administered 2013-05-21: 100 mL via INTRAVENOUS

## 2013-05-23 ENCOUNTER — Other Ambulatory Visit: Payer: Self-pay | Admitting: Family Medicine

## 2013-05-23 ENCOUNTER — Ambulatory Visit (HOSPITAL_COMMUNITY)
Admission: RE | Admit: 2013-05-23 | Discharge: 2013-05-23 | Disposition: A | Discharge status: Home / Self Care | Payer: Medicare Other | Source: Ambulatory Visit | Attending: Family Medicine | Admitting: Family Medicine

## 2013-05-23 ENCOUNTER — Ambulatory Visit (HOSPITAL_COMMUNITY): Payer: Medicare Other

## 2013-05-23 DIAGNOSIS — N949 Unspecified condition associated with female genital organs and menstrual cycle: Secondary | ICD-10-CM | POA: Insufficient documentation

## 2013-05-23 DIAGNOSIS — R1900 Intra-abdominal and pelvic swelling, mass and lump, unspecified site: Secondary | ICD-10-CM

## 2013-05-23 DIAGNOSIS — M5116 Intervertebral disc disorders with radiculopathy, lumbar region: Secondary | ICD-10-CM

## 2013-05-23 DIAGNOSIS — N9489 Other specified conditions associated with female genital organs and menstrual cycle: Secondary | ICD-10-CM

## 2013-05-23 DIAGNOSIS — M48061 Spinal stenosis, lumbar region without neurogenic claudication: Secondary | ICD-10-CM

## 2013-05-31 ENCOUNTER — Ambulatory Visit: Payer: Self-pay | Admitting: Adult Health

## 2013-05-31 ENCOUNTER — Encounter: Payer: Self-pay | Admitting: Obstetrics & Gynecology

## 2013-05-31 ENCOUNTER — Ambulatory Visit (INDEPENDENT_AMBULATORY_CARE_PROVIDER_SITE_OTHER): Payer: Medicare Other | Admitting: Obstetrics & Gynecology

## 2013-05-31 VITALS — BP 144/80 | Ht 62.0 in | Wt 154.0 lb

## 2013-05-31 DIAGNOSIS — C562 Malignant neoplasm of left ovary: Secondary | ICD-10-CM

## 2013-05-31 DIAGNOSIS — R1904 Left lower quadrant abdominal swelling, mass and lump: Secondary | ICD-10-CM

## 2013-05-31 DIAGNOSIS — R1031 Right lower quadrant pain: Secondary | ICD-10-CM

## 2013-05-31 NOTE — Progress Notes (Signed)
Patient ID: Kristen Cox, female   DOB: 10-05-1940, 72 y.o.   MRN: [id number] Ms. Kristen Cox is a 72 y.o. G3P2 referred from Dr. Lodema Hong for evaluation of a left pelvic mass measuring 3-1/2 cm As far as the patient is away her this mass has never been discovered before  The CT scan and then subsequently sonogram was performed due to patient's right lower quadrant pain Interestingly The pathology was a 3-1/2 cm solid mass in the left pelvis, differential course included ovarian versus uterine origin  I have evaluated the films and this most likely represents a pedunculated or broad ligament myoma There is no evidence of the mass itself or any other findings that would point toward this being a malignant process  I am checking a CA 125 today which I anticipate being normal If indeed it is normal I will see her back in 6 months for a repeat sonogram and evaluation here in our office  Additionally an evaluation of her lower quadrant pain it could be emanating from her back i.e. Neurogenic, or more directly musculoskeletal I did a 0.5% Marcaine injection along the area of tenderness and pain more medially not laterally and her pain resolved Again this could be purely musculoskeletal or it could be neurogenic were emanating from her back  We will see her back in 6 months for sonogram and evaluation

## 2013-05-31 NOTE — Addendum Note (Signed)
Addended by: Lazaro Arms on: 05/31/2013 11:08 AM   Modules accepted: Orders

## 2013-06-01 LAB — CA 125: CA 125: 3 U/mL (ref 0.0–30.2)

## 2013-06-05 ENCOUNTER — Encounter: Payer: Self-pay | Admitting: Gastroenterology

## 2013-06-05 ENCOUNTER — Ambulatory Visit (INDEPENDENT_AMBULATORY_CARE_PROVIDER_SITE_OTHER): Payer: Medicare Other | Admitting: Gastroenterology

## 2013-06-05 VITALS — BP 150/90 | HR 82 | Temp 98.1°F | Ht 62.0 in | Wt 152.8 lb

## 2013-06-05 DIAGNOSIS — R1031 Right lower quadrant pain: Secondary | ICD-10-CM | POA: Insufficient documentation

## 2013-06-05 NOTE — Assessment & Plan Note (Addendum)
72 y/o female with pain located over right hip area. Symptoms resolved after Marcaine injection by Dr. Despina Hidden last week. Interestingly her abnormal CT/US findings are in the left adnexal region. She is being following by Dr.Eure for this. From a GI standpoint, it does not appear she has any need for further w/u. I suspect her pain was musculoskeletal. She is current on her colonoscopy, and has no bowel issues. No UGI concerns.   She will monitor for recurrent pain, fever, bowel change, weight loss, blood in stool. If she were to develop any of these symptoms she should let us know. Otherwise, she is due for next colonoscopy in 2017.

## 2013-06-05 NOTE — Progress Notes (Signed)
Primary Care Physician:  Syliva Overman, MD  Primary Gastroenterologist:  Roetta Sessions, MD   Chief Complaint  Patient presents with  . Abdominal Pain    HPI:  Kristen Cox is a 72 y.o. female here for further evaluation of RLQ abdominal pain/mass. Initially evaluated by Dr. Lodema Hong via CT of the abdomen and pelvis with contrast on 05/15/2013. Patient was noted to have a 3.5 cm left adnexal mass with differential diagnosis including ovarian mass and pedunculated fibroid. She also had diverticulosis, multiple hepatic cysts. Transvaginal/pelvic ultrasound performed next which showed left uterine fundal/adnexal mass without identifiable left ovarian tissue separate from this mass. Patient saw Dr. Despina Hidden last week. He reviewed the films and felt like the mass was a pedunculated or broad ligament myoma. CEA 125 was normal. He felt her pain was musculoskeletal as it did resolve with 0.5% Marcaine injection. Other recent labs as noted below were normal.  Saw Dr. Shon Baton last week. Likely will have injections in her back again.   Patient notes her pain was located right over her right hip. Much better since Friday with injection Dr. Despina Hidden. Had pain off/on for months. After last back injection is when noted right hip pain. Worse for the past month or so. 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 Not affected by meals. Takes MOM once per week. No melena, brbpr. Appetite good. No n/v, heartburn. No dysphagia. No back pain now.   Current Outpatient Prescriptions  Medication Sig Dispense Refill  . amLODipine (NORVASC) 10 MG tablet TAKE 1 TABLET BY MOUTH EVERY DAY  30 tablet  3  . aspirin (ASPIRIN LOW DOSE) 81 MG EC tablet Take 81 mg by mouth daily.        Marland Kitchen atenolol (TENORMIN) 50 MG tablet Take 1 tablet (50 mg total) by mouth daily.  30 tablet  4  . cycloSPORINE (RESTASIS) 0.05 % ophthalmic emulsion Place 1 drop into both eyes 2 (two) times daily.        . Flaxseed,  Linseed, (FLAX SEED OIL) 1000 MG CAPS Take by mouth daily.        . magnesium hydroxide (MILK OF MAGNESIA) 400 MG/5ML suspension Take by mouth daily as needed for constipation.      . Omega-3 Fatty Acids (FISH OIL) 1000 MG CAPS Take by mouth daily.        . potassium chloride (K-DUR,KLOR-CON) 10 MEQ tablet TAKE 1 TABLET BY MOUTH ONCE DAILY  30 tablet  3  . traMADol (ULTRAM) 50 MG tablet One tablet at bedtime as needed for severe pain  30 tablet  0  . fluticasone (FLONASE) 50 MCG/ACT nasal spray Place 2 sprays into the nose daily.  16 g  2   No current facility-administered medications for this visit.    Allergies as of 06/05/2013 - Review Complete 06/05/2013  Allergen Reaction Noted  . Benazepril Other (See Comments) 11/30/2010    Past Medical History  Diagnosis Date  . Hypertension   . Gout 2009  . Dry eyes 2010    on restasis - Dr. Charise Killian   . Spinal stenosis     Past Surgical History  Procedure Laterality Date  . Cesarean section    . Colonoscopy  04/09/2009    WUJ:WJXB papilla otherwise, normal rectum/Left-sided diverticula/Cecal polyp status post cold snare and removal/The remainder of the colonic mucosa appeared normal. Adenomatous polyps.    Family History  Problem Relation Age of Onset  . Stroke Mother   .  Stroke Father   . Pulmonary embolism Sister   . Diabetes Sister   . Colon cancer Other     niece, age 91  . Colon polyps Other     nephew    History   Social History  . Marital Status: Widowed    Spouse Name: N/A    Number of Children: 2  . Years of Education: N/A   Occupational History  . retired     Social History Main Topics  . Smoking status: Never Smoker   . Smokeless tobacco: Not on file  . Alcohol Use: No  . Drug Use: No  . Sexual Activity: Not Currently   Other Topics Concern  . Not on file   Social History Narrative  . No narrative on file      ROS:  General: Negative for anorexia, weight loss, fever, chills, fatigue,  weakness. Eyes: Negative for vision changes.  ENT: Negative for hoarseness, difficulty swallowing , nasal congestion. CV: Negative for chest pain, angina, palpitations, dyspnea on exertion, peripheral edema.  Respiratory: Negative for dyspnea at rest, dyspnea on exertion, cough, sputum, wheezing.  GI: See history of present illness. GU:  Negative for dysuria, hematuria, urinary incontinence, urinary frequency, nocturnal urination.  MS: Negative for joint pain. Chronic low back pain.  Derm: Negative for rash or itching.  Neuro: Negative for weakness, abnormal sensation, seizure, frequent headaches, memory loss, confusion.  Psych: Negative for anxiety, depression, suicidal ideation, hallucinations.  Endo: Negative for unusual weight change.  Heme: Negative for bruising or bleeding. Allergy: Negative for rash or hives.    Physical Examination:  BP 150/90  Pulse 82  Temp(Src) 98.1 F (36.7 C) (Oral)  Ht 5\' 2"  (1.575 m)  Wt 152 lb 12.8 oz (69.31 kg)  BMI 27.94 kg/m2   General: Well-nourished, well-developed in no acute distress.  Head: Normocephalic, atraumatic.   Eyes: Conjunctiva pink, no icterus. Mouth: Oropharyngeal mucosa moist and pink , no lesions erythema or exudate. Neck: Supple without thyromegaly, masses, or lymphadenopathy.  Lungs: Clear to auscultation bilaterally.  Heart: Regular rate and rhythm, no murmurs rubs or gallops.  Abdomen: Bowel sounds are normal, nontender, nondistended, no hepatosplenomegaly or masses, no abdominal bruits or hernia , no rebound or guarding.   Rectal: not performed Extremities: No lower extremity edema. No clubbing or deformities.  Neuro: Alert and oriented x 4 , grossly normal neurologically.  Skin: Warm and dry, no rash or jaundice.   Psych: Alert and cooperative, normal mood and affect.  Labs: Lab Results  Component Value Date   CA125 3.0 05/31/2013   Lab Results  Component Value Date   WBC 6.4 05/15/2013   HGB 13.4 05/15/2013    HCT 39.5 05/15/2013   MCV 88.2 05/15/2013   PLT 271 05/15/2013   Lab Results  Component Value Date   CREATININE 0.72 05/15/2013   BUN 17 05/15/2013   NA 141 05/15/2013   K 4.8 05/15/2013   CL 107 05/15/2013   CO2 31 05/15/2013   Lab Results  Component Value Date   ALT 12 05/15/2013   AST 22 05/15/2013   ALKPHOS 64 05/15/2013   BILITOT 0.5 05/15/2013     Imaging Studies: Mr Lumbar Spine Wo Contrast  05/21/2013   CLINICAL DATA:  Low back pain radiating into the right leg. No known injury.  EXAM: MRI LUMBAR SPINE WITHOUT CONTRAST  TECHNIQUE: Multiplanar, multisequence MR imaging was performed. No intravenous contrast was administered.  COMPARISON:  Abdominal pelvic CT 05/21/2013. Report from lumbar  MRI 04/15/2003.  FINDINGS: CT demonstrates 5 lumbar type vertebral bodies. There is a mild convex left scoliosis with mild straightening of the usual lumbar lordosis. There is no evidence of fracture or pars defect. Prominent endplate degenerative changes are present at L1-2 and L5-S1.  The conus medullaris extends to the L2 level. There is mild clumping of the nerve roots within the spinal canal, previously described. Aortic atherosclerosis and multiple hepatic cysts are noted. There are no paraspinal abnormalities.  T10-11: Disc bulging and paraspinal osteophytes efface the CSF surrounding the cord. There is no cord deformity or high-grade foraminal stenosis.  T11-12: There is a broad-based central disc protrusion with paraspinal osteophytes. There is mild flattening of the distal thoracic cord. There is asymmetric narrowing of the left foramen with possible left T11 nerve root encroachment.  T12-L1:  Normal interspace.  L1-2: There is diffuse disc degeneration with annular bulging, loss of height and vacuum phenomenon. There is mild facet and ligamentous hypertrophy. These factors contribute to mild central stenosis with asymmetric right foraminal narrowing with possible right L1 nerve root encroachment.  L2-3:  There is moderate multifactorial spinal stenosis secondary to annular disc bulging, facet and ligamentous hypertrophy. The foramina appear sufficiently patent.  L3-4: There is moderate multifactorial spinal stenosis secondary to annular disc bulging, facet and ligamentous hypertrophy. In addition, there is mild lateral recess and foraminal stenosis bilaterally.  L4-5: There is annular disc bulging eccentric to the left with moderate facet and ligamentous hypertrophy. These factors contribute to mild central and left greater than right lateral recess stenosis. In addition, there is mild right and moderate to severe left foraminal stenosis with probable left L4 nerve root encroachment.  L5-S1: There is chronic disc degeneration with annular bulging and vacuum phenomena and. Moderate foraminal narrowing is present, worse on the left. There is mild bilateral facet hypertrophy.  IMPRESSION: 1. Multilevel lumbar spondylosis with disc degeneration throughout the lumbar and lower thoracic spine as described. There is mild to moderate resulting central stenosis, greatest at L2-3 and L3-4. 2. There is associated multilevel foraminal stenosis. With regard to the right radicular symptoms, the right foraminal stenosis appears worst at L1-2 and L5-S1. The left foraminal stenosis appears worst at L4-5 and L5-S1.   Electronically Signed   By: Roxy Horseman   On: 05/21/2013 16:34   US Transvaginal Non-ob  05/23/2013   CLINICAL DATA:  Left adnexal mass on prior exam. Right lower quadrant pelvic pain and palpable mass over the right lower quadrant.  EXAM: TRANSABDOMINAL AND TRANSVAGINAL ULTRASOUND OF PELVIS  TECHNIQUE: Both transabdominal and transvaginal ultrasound examinations of the pelvis were performed. Transabdominal technique was performed for global imaging of the pelvis including uterus, ovaries, adnexal regions, and pelvic cul-de-sac. It was necessary to proceed with endovaginal exam following the transabdominal exam to  visualize the endometrium and ovaries.  COMPARISON:  CT 05/21/2013 at [facility]  FINDINGS: Uterus  Measurements: 8.7 x 4.0 x 2.6 cm. Retroverted, anteflexed. This renders visualization of the fundus suboptimal. Not well visualized. Possible mass at the left lateral uterine fundus measuring 3.5 x 3.5 x 3.4 cm.  Endometrium  Thickness: 4 mm. Suboptimally visualized but uniformly echogenic where visualized. No focal abnormality visualized.  Right ovary  Measurements: 2.6 x 2.6 x 1.7 cm. Seen transabdominally only, not well visualized but no focal abnormality identified. Normal appearance/no adnexal mass.  Left ovary  Measurements: Not visualized separate from solid left adnexal mass described above.  Other findings  No free fluid  IMPRESSION:  Left uterine fundal/ adnexal mass without identifiable left ovarian tissue separate from this mass. Primary differential considerations again include subserosal or broad ligament fibroid versus solid ovarian mass such as fibroma, thecoma, Brenner tumor, or less likely metastasis. Further characterization with pelvic MRI with contrast would be the imaging examination of choice for further evaluation, performed on a nonemergent basis as an outpatient.   Electronically Signed   By: Christiana Pellant M.D.   On: 05/23/2013 16:20   Ct Abdomen Pelvis W Contrast  05/21/2013   CLINICAL DATA:  Right lower quadrant pain. Palpable knot or mass in right lower abdomen.  EXAM: CT ABDOMEN AND PELVIS WITH CONTRAST  TECHNIQUE: Multidetector CT imaging of the abdomen and pelvis was performed using the standard protocol following bolus administration of intravenous contrast.  CONTRAST:  OMNIPAQUE IOHEXOL 300 MG/ML  SOLN  COMPARISON:  None.  FINDINGS: Multiple hepatic cysts are seen in both lobes, largest measuring 7 cm in the right lobe. No liver masses are identified. Gallbladder is unremarkable. The pancreas, spleen, and adrenal glands are normal in appearance. Both kidneys are  normal in appearance, and there is no evidence of hydronephrosis.  A low attenuation mass is seen in the left adnexa which measures approximately 3.5 cm. This could represent an ovarian mass or pedunculated fibroid. No other masses or lymphadenopathy identified. No evidence of ascites.  Diverticulosis is demonstrated, however there is no evidence of diverticulitis. Normal appendix is visualized. There is no evidence of abdominal wall hernia or mass. No suspicious bone lesions identified.  IMPRESSION: 3.5 cm left adnexal mass. Differential diagnosis includes ovarian mass and pedunculated fibroid. Pelvic ultrasound is recommended for further evaluation.  Diverticulosis. No radiographic evidence of diverticulitis.  Multiple hepatic cysts.   Electronically Signed   By: Myles Rosenthal   On: 05/21/2013 15:43

## 2013-06-05 NOTE — Progress Notes (Signed)
cc'd to pcp 

## 2013-06-05 NOTE — Patient Instructions (Signed)
1. Please call us if he had recurrent abdominal pain, fever, change in bowel habits, blood in the stool, weight loss. 2. I do not feel that your pain GI related. It is likely musculoskeletal. 3. You will be due for your next colonoscopy in 03/2016 according to Dr. Luvenia Starch previous recommendations.

## 2013-06-13 ENCOUNTER — Ambulatory Visit: Payer: Medicare Other | Admitting: Family Medicine

## 2013-06-27 ENCOUNTER — Encounter: Payer: Self-pay | Admitting: Family Medicine

## 2013-06-27 ENCOUNTER — Encounter (INDEPENDENT_AMBULATORY_CARE_PROVIDER_SITE_OTHER): Payer: Self-pay

## 2013-06-27 ENCOUNTER — Ambulatory Visit (INDEPENDENT_AMBULATORY_CARE_PROVIDER_SITE_OTHER): Payer: Medicare Other | Admitting: Family Medicine

## 2013-06-27 VITALS — BP 140/70 | HR 64 | Resp 16 | Ht 62.0 in | Wt 154.0 lb

## 2013-06-27 DIAGNOSIS — J301 Allergic rhinitis due to pollen: Secondary | ICD-10-CM

## 2013-06-27 DIAGNOSIS — E785 Hyperlipidemia, unspecified: Secondary | ICD-10-CM

## 2013-06-27 DIAGNOSIS — I10 Essential (primary) hypertension: Secondary | ICD-10-CM

## 2013-06-27 DIAGNOSIS — M549 Dorsalgia, unspecified: Secondary | ICD-10-CM

## 2013-06-27 MED ORDER — ATENOLOL 50 MG PO TABS: 50.0000 mg | ORAL_TABLET | Freq: Every day | ORAL | Status: DC

## 2013-06-27 NOTE — Patient Instructions (Signed)
F/u in 5.5 month, call if you need me before  I am happy that you are doing much better.  No changes in medication

## 2013-06-27 NOTE — Progress Notes (Signed)
  Subjective:    Patient ID: Kristen Cox, female    DOB: Nov 20, 1940, 72 y.o.   MRN: [id number]  HPI Pt in for f/u of back and abdominal pain, much better, back to herself. Hd repeat epidural through spine center, which  Was successful as it addressed the problem at the root. Has gyne f/u planned for 6 month, has also seen GI     Review of Systems See HPI Denies recent fever or chills. Denies sinus pressure, nasal congestion, ear pain or sore throat. Denies chest congestion, productive cough or wheezing. Denies chest pains, palpitations and leg swelling Denies abdominal pain, nausea, vomiting,diarrhea or constipation.   Denies dysuria, frequency, hesitancy or incontinence. Denies joint pain, swelling and limitation in mobility. Denies headaches, seizures, numbness, or tingling. Denies depression, anxiety or insomnia. Denies skin break down or rash.        Objective:   Physical Exam  Patient alert and oriented and in no cardiopulmonary distress.  HEENT: No facial asymmetry, EOMI, no sinus tenderness,  oropharynx pink and moist.  Neck supple no adenopathy.  Chest: Clear to auscultation bilaterally.  CVS: S1, S2 no murmurs, no S3.  ABD: Soft non tender. Bowel sounds normal.  Ext: No edema  MS: Adequate ROM spine, shoulders, hips and knees.  Skin: Intact, no ulcerations or rash noted.  Psych: Good eye contact, normal affect. Memory intact not anxious or depressed appearing.  CNS: CN 2-12 intact, power, tone and sensation normal throughout.       Assessment & Plan:

## 2013-06-29 NOTE — Assessment & Plan Note (Signed)
No symptoms currently, none expected before Spring

## 2013-06-29 NOTE — Assessment & Plan Note (Signed)
Marked improvement with resolution following recent epidural through spine center

## 2013-06-29 NOTE — Assessment & Plan Note (Signed)
Unchanged Hyperlipidemia:Low fat diet discussed and encouraged.  No meds

## 2013-06-29 NOTE — Assessment & Plan Note (Signed)
Adequate control No med change DASH diet and commitment to daily physical activity for a minimum of 30 minutes discussed and encouraged, as a part of hypertension management. The importance of attaining a healthy weight is also discussed.

## 2013-07-30 ENCOUNTER — Other Ambulatory Visit: Payer: Self-pay | Admitting: Family Medicine

## 2013-08-09 ENCOUNTER — Telehealth: Payer: Self-pay | Admitting: *Deleted

## 2013-08-09 MED ORDER — METHOCARBAMOL 500 MG PO TABS: ORAL_TABLET | ORAL | Status: AC

## 2013-08-09 NOTE — Telephone Encounter (Signed)
Patient states she has been having neck pain when turning x 3 days. Sore to turn her neck to the side. Wanting to know if she could have a muscle relaxer sent in. Please advise

## 2013-08-09 NOTE — Addendum Note (Signed)
Addended by: Kerri Perches on: 08/09/2013 04:39 PM   Modules accepted: Orders

## 2013-08-09 NOTE — Telephone Encounter (Signed)
pls let pt know and fax in robaxin 500mg  one at night as needed #20 pls

## 2013-08-09 NOTE — Telephone Encounter (Signed)
Patient aware.

## 2013-08-24 ENCOUNTER — Telehealth: Payer: Self-pay

## 2013-08-24 MED ORDER — INDOMETHACIN 50 MG PO CAPS: ORAL_CAPSULE | ORAL | Status: DC

## 2013-08-24 MED ORDER — PREDNISONE 5 MG PO TABS: 5.0000 mg | ORAL_TABLET | Freq: Two times a day (BID) | ORAL | Status: AC

## 2013-08-24 NOTE — Telephone Encounter (Signed)
Patient aware.  Stating that pharmacy has already called stating that she will need a PA on one of the meds but she is not sure of which one.  Advised patient to go ahead and collect the script that is ready and we will work on [place].

## 2013-08-24 NOTE — Telephone Encounter (Signed)
Indomeathacin and short course of prednisone for 5 days sent in .Pls let her know. . A  Pls also offer toradol 30mg  and depo medrol 80mg  Im today, this morning, as nurse visit only for the acute pain, in addition to meds already sent

## 2013-08-30 ENCOUNTER — Other Ambulatory Visit: Payer: Self-pay | Admitting: Family Medicine

## 2013-08-30 DIAGNOSIS — Z139 Encounter for screening, unspecified: Secondary | ICD-10-CM

## 2013-09-21 ENCOUNTER — Ambulatory Visit (HOSPITAL_COMMUNITY)
Admission: RE | Admit: 2013-09-21 | Discharge: 2013-09-21 | Disposition: A | Discharge status: Home / Self Care | Payer: Medicare Other | Source: Ambulatory Visit | Attending: Family Medicine | Admitting: Family Medicine

## 2013-09-21 DIAGNOSIS — Z1231 Encounter for screening mammogram for malignant neoplasm of breast: Secondary | ICD-10-CM | POA: Insufficient documentation

## 2013-09-21 DIAGNOSIS — Z139 Encounter for screening, unspecified: Secondary | ICD-10-CM

## 2013-09-27 ENCOUNTER — Other Ambulatory Visit: Payer: Medicare Other | Admitting: Obstetrics & Gynecology

## 2013-10-02 ENCOUNTER — Encounter (INDEPENDENT_AMBULATORY_CARE_PROVIDER_SITE_OTHER): Payer: Self-pay

## 2013-10-02 ENCOUNTER — Other Ambulatory Visit: Payer: Medicare Other | Admitting: Obstetrics & Gynecology

## 2013-10-03 ENCOUNTER — Other Ambulatory Visit: Payer: Self-pay | Admitting: Family Medicine

## 2013-11-19 ENCOUNTER — Ambulatory Visit (INDEPENDENT_AMBULATORY_CARE_PROVIDER_SITE_OTHER): Payer: Medicare Other | Admitting: Family Medicine

## 2013-11-19 ENCOUNTER — Encounter (INDEPENDENT_AMBULATORY_CARE_PROVIDER_SITE_OTHER): Payer: Self-pay

## 2013-11-19 ENCOUNTER — Encounter: Payer: Self-pay | Admitting: Family Medicine

## 2013-11-19 ENCOUNTER — Other Ambulatory Visit: Payer: Self-pay

## 2013-11-19 VITALS — BP 170/82 | HR 65 | Resp 15 | Ht 62.0 in | Wt 152.8 lb

## 2013-11-19 DIAGNOSIS — M4800 Spinal stenosis, site unspecified: Secondary | ICD-10-CM

## 2013-11-19 DIAGNOSIS — R1904 Left lower quadrant abdominal swelling, mass and lump: Secondary | ICD-10-CM

## 2013-11-19 DIAGNOSIS — M5430 Sciatica, unspecified side: Secondary | ICD-10-CM

## 2013-11-19 DIAGNOSIS — I10 Essential (primary) hypertension: Secondary | ICD-10-CM

## 2013-11-19 MED ORDER — SPIRONOLACTONE 25 MG PO TABS: 25.0000 mg | ORAL_TABLET | Freq: Every day | ORAL | Status: DC

## 2013-11-19 MED ORDER — AMLODIPINE BESYLATE 10 MG PO TABS: ORAL_TABLET | ORAL | Status: DC

## 2013-11-19 MED ORDER — ATENOLOL 50 MG PO TABS: 50.0000 mg | ORAL_TABLET | Freq: Every day | ORAL | Status: DC

## 2013-11-19 MED ORDER — GABAPENTIN 100 MG PO CAPS: ORAL_CAPSULE | ORAL | Status: DC

## 2013-11-19 MED ORDER — PREDNISONE 5 MG PO TABS: 5.0000 mg | ORAL_TABLET | Freq: Two times a day (BID) | ORAL | Status: AC

## 2013-11-19 MED ORDER — KETOROLAC TROMETHAMINE 30 MG/ML IJ SOLN
30.0000 mg | Freq: Once | INTRAMUSCULAR | Status: AC
  Administered 2013-11-19: 30 mg via INTRAMUSCULAR

## 2013-11-19 MED ORDER — METHYLPREDNISOLONE ACETATE 80 MG/ML IJ SUSP
80.0000 mg | Freq: Once | INTRAMUSCULAR | Status: AC
  Administered 2013-11-19: 80 mg via INTRAMUSCULAR

## 2013-11-19 MED ORDER — IBUPROFEN 800 MG PO TABS: ORAL_TABLET | ORAL | Status: DC

## 2013-11-19 MED ORDER — KETOROLAC TROMETHAMINE 30 MG/ML IJ SOLN: 30.0000 mg | Freq: Once | INTRAMUSCULAR | Status: DC

## 2013-11-19 NOTE — Progress Notes (Signed)
   Subjective:    Patient ID: Kristen Cox, female    DOB: 07-29-1941, 73 y.o.   MRN: [id number]  HPI Pt called in to be seen sooner due top elevated Bp at recent screening ion the community. Also states her right hip and groin pain has worsened a lot in the month, limits her safe ambulation, particular movements are extremely painful. Denies falling, and was on her treadmill up to this morning, I advised against this. Recently had a good check re hypercalcemia, and has 6 month follow up with endo. Has upcoming gyne re eval   Review of Systems See HPI Denies recent fever or chills. Denies sinus pressure, nasal congestion, ear pain or sore throat. Denies chest congestion, productive cough or wheezing. Denies chest pains, palpitations and leg swelling Denies abdominal pain, nausea, vomiting,diarrhea or constipation.   Denies dysuria, frequency, hesitancy or incontinence. Denies headaches, seizures, numbness, or tingling. Denies depression, anxiety or insomnia.When in excess pain uses tramadol for relief Denies skin break down or rash.        Objective:   Physical Exam BP 170/82  Pulse 65  Resp 15  Ht 5\' 2"  (1.575 m)  Wt 152 lb 12.8 oz (69.31 kg)  BMI 27.94 kg/m2  SpO2 98% Patient alert and oriented and in no cardiopulmonary distress.  HEENT: No facial asymmetry, EOMI, no sinus tenderness,  oropharynx pink and moist.  Neck supple no adenopathy.  Chest: Clear to auscultation bilaterally.  CVS: S1, S2 no murmurs, no S3.  ABD: Soft non tender. Bowel sounds normal.  Ext: No edema  JO:[facility]  ROM spine, adequate in  shoulders, hips and knees.Abnormal gait due to right lower extremity pain  Skin: Intact, no ulcerations or rash noted.  Psych: Good eye contact, normal affect. Memory intact not anxious or depressed appearing.  CNS: CN 2-12 intact, power, tone and sensation normal throughout.        Assessment & Plan:  HYPERTENSION Uncontrolled, add  spironolactone and discontinue potasium F/u in 1 monht. Chem 7 prior to f/u  SPINAL STENOSIS Uncontrolled pain and difficulty ambulating x 2 4 to 6 weeks Depo medrol 80 mg and toradol 30mg  IM in office, short course of prednisone and start gabapentin at bedtime, titrating up as needed and tolerated to 300mg  at bedtime Advised pt to stop walking on treadmill until symptoms have improved  Hypercalcemia Improved, recently seen by endo and has appt in next 5 month  Abdominal or pelvic swelling, mass, or lump, left lower quadrant Has gyne appt for f/u in the next month

## 2013-11-19 NOTE — Assessment & Plan Note (Signed)
Improved, recently seen by endo and has appt in next 5 month

## 2013-11-19 NOTE — Patient Instructions (Signed)
F/u as before, call if you need me with any problems   New additional medication for blood pressure is spironolactone one daily, STOP potassium  For right hip and groin pain, 2 injections today, new is prednisone for 5 days only.  New for bedtime use for the pain every night is gabapentin, start with one capsule, after 3 nights if insufficient relief, increase to two capsules at bedtime, after another 3 nights may go up to the three capsules at bedtime as on bottle.  Non fasting chem 7 on April 20 please  Limit ibuprofen to 2 tablets per week please

## 2013-11-19 NOTE — Assessment & Plan Note (Signed)
Uncontrolled, add spironolactone and discontinue potasium F/u in 1 monht. Chem 7 prior to f/u

## 2013-11-19 NOTE — Assessment & Plan Note (Signed)
Has gyne appt for f/u in the next month

## 2013-11-19 NOTE — Addendum Note (Signed)
Addended by: Eual Fines on: 11/19/2013 12:48 PM   Modules accepted: Orders

## 2013-11-19 NOTE — Assessment & Plan Note (Signed)
Uncontrolled pain and difficulty ambulating x 2 4 to 6 weeks Depo medrol 80 mg and toradol 30mg  IM in office, short course of prednisone and start gabapentin at bedtime, titrating up as needed and tolerated to 300mg  at bedtime Advised pt to stop walking on treadmill until symptoms have improved

## 2013-11-29 ENCOUNTER — Other Ambulatory Visit: Payer: Self-pay | Admitting: Family Medicine

## 2013-11-29 ENCOUNTER — Other Ambulatory Visit: Payer: Self-pay | Admitting: Obstetrics & Gynecology

## 2013-11-29 DIAGNOSIS — R1900 Intra-abdominal and pelvic swelling, mass and lump, unspecified site: Secondary | ICD-10-CM

## 2013-12-03 ENCOUNTER — Other Ambulatory Visit (HOSPITAL_COMMUNITY)
Admission: RE | Admit: 2013-12-03 | Discharge: 2013-12-03 | Disposition: A | Discharge status: Home / Self Care | Payer: Medicare Other | Source: Ambulatory Visit | Attending: Obstetrics & Gynecology | Admitting: Obstetrics & Gynecology

## 2013-12-03 ENCOUNTER — Ambulatory Visit (INDEPENDENT_AMBULATORY_CARE_PROVIDER_SITE_OTHER): Payer: Medicare Other | Admitting: Obstetrics & Gynecology

## 2013-12-03 ENCOUNTER — Encounter: Payer: Self-pay | Admitting: Obstetrics & Gynecology

## 2013-12-03 ENCOUNTER — Ambulatory Visit (INDEPENDENT_AMBULATORY_CARE_PROVIDER_SITE_OTHER): Payer: Medicare Other

## 2013-12-03 VITALS — BP 190/80 | Ht 62.0 in | Wt 153.0 lb

## 2013-12-03 DIAGNOSIS — R1900 Intra-abdominal and pelvic swelling, mass and lump, unspecified site: Secondary | ICD-10-CM

## 2013-12-03 DIAGNOSIS — Z124 Encounter for screening for malignant neoplasm of cervix: Secondary | ICD-10-CM | POA: Insufficient documentation

## 2013-12-03 DIAGNOSIS — Z01419 Encounter for gynecological examination (general) (routine) without abnormal findings: Secondary | ICD-10-CM

## 2013-12-03 NOTE — Progress Notes (Signed)
Patient ID: Kristen Cox, female   DOB: Mar 02, 1941, 73 y.o.   MRN: [id number] Previous note: Kristen Cox is a 73 y.o. G3P2 referred from Dr. Moshe Cipro for evaluation of a left pelvic mass measuring 3-1/2 cm  As far as the patient is away her this mass has never been discovered before  The CT scan and then subsequently sonogram was performed due to patient's right lower quadrant pain  Interestingly The pathology was a 3-1/2 cm solid mass in the left pelvis, differential course included ovarian versus uterine origin  I have evaluated the films and this most likely represents a pedunculated or broad ligament myoma  There is no evidence of the mass itself or any other findings that would point toward this being a malignant process  I am checking a CA 125 today which I anticipate being normal  If indeed it is normal I will see her back in 6 months for a repeat sonogram and evaluation here in our office  Additionally an evaluation of her lower quadrant pain it could be emanating from her back i.e. Neurogenic, or more directly musculoskeletal  I did a 0.5% Marcaine injection along the area of tenderness and pain more medially not laterally and her pain resolved  Again this could be purely musculoskeletal or it could be neurogenic were emanating from her back  We will see her back in 6 months for sonogram and evaluation     Follow up sonogram below US Transvaginal Non-ob  12/03/2013   GYNECOLOGIC SONOGRAM   Kristen Cox is a 31 y.o.  a pelvic sonogram for pelvic mass noted on Lt  per Ct and previous u/s in 2014.  Uterus                      7.5 x 4.5 x 3.0 cm, with Lt fibroid noted= 3.6  x 3.4x 3.1cm noted   Endometrium          2.9 mm, symmetrical,   Right ovary             1.5 x 1.2 x 0.9 cm,   Left ovary                1.8 x 1.5 x 1.4 cm, Lt adnexa with 1.7 x 1.4cm  solid area noted no +doppler flow noted within  No free fluid noted within pelvis  Technician Comments:  Anteverted uterus noted with  pedunculated fibroid noted on Lt of  fundus=3.6x 3.4x 3.1cm, Rt ovary appears WNL, Lt ovary appears wnl with Lt  adnexal solid area noted =1.7 x 1.4cm separate from ovary no +doppler flow  noted within, no free fluid noted within pelvis   Kristen Cox 12/03/2013 9:36 AM  Clinical Impression and recommendations:  I have reviewed the sonogram results above, combined with the patient's  current clinical course, below are my impressions and any appropriate  recommendations for management based on the sonographic findings.  Stable pelvic sonographic findings No interval change Repeat srudy 2 years or so  Kristen Cox 12/03/2013 10:14 AM      Follow up pap done  Repeat sonogram 2 years Repeat pap 3 years and if normal may stop Paps

## 2013-12-10 LAB — BASIC METABOLIC PANEL
BUN: 17 mg/dL (ref 6–23)
CHLORIDE: 103 meq/L (ref 96–112)
CO2: 28 meq/L (ref 19–32)
CREATININE: 0.74 mg/dL (ref 0.50–1.10)
Calcium: 10.1 mg/dL (ref 8.4–10.5)
GLUCOSE: 92 mg/dL (ref 70–99)
Potassium: 4.6 mEq/L (ref 3.5–5.3)
Sodium: 139 mEq/L (ref 135–145)

## 2013-12-11 ENCOUNTER — Encounter (INDEPENDENT_AMBULATORY_CARE_PROVIDER_SITE_OTHER): Payer: Self-pay

## 2013-12-11 ENCOUNTER — Encounter: Payer: Self-pay | Admitting: Family Medicine

## 2013-12-11 ENCOUNTER — Ambulatory Visit (INDEPENDENT_AMBULATORY_CARE_PROVIDER_SITE_OTHER): Payer: Medicare Other | Admitting: Family Medicine

## 2013-12-11 VITALS — BP 154/84 | HR 64 | Resp 16 | Wt 154.8 lb

## 2013-12-11 DIAGNOSIS — M4800 Spinal stenosis, site unspecified: Secondary | ICD-10-CM

## 2013-12-11 DIAGNOSIS — R7301 Impaired fasting glucose: Secondary | ICD-10-CM

## 2013-12-11 DIAGNOSIS — J301 Allergic rhinitis due to pollen: Secondary | ICD-10-CM

## 2013-12-11 DIAGNOSIS — Z13820 Encounter for screening for osteoporosis: Secondary | ICD-10-CM

## 2013-12-11 DIAGNOSIS — E785 Hyperlipidemia, unspecified: Secondary | ICD-10-CM

## 2013-12-11 DIAGNOSIS — I10 Essential (primary) hypertension: Secondary | ICD-10-CM

## 2013-12-11 MED ORDER — MONTELUKAST SODIUM 10 MG PO TABS: 10.0000 mg | ORAL_TABLET | Freq: Every day | ORAL | Status: DC

## 2013-12-11 MED ORDER — PREDNISONE 5 MG PO TABS: 5.0000 mg | ORAL_TABLET | Freq: Two times a day (BID) | ORAL | Status: AC

## 2013-12-11 MED ORDER — METHYLPREDNISOLONE ACETATE 80 MG/ML IJ SUSP
80.0000 mg | Freq: Once | INTRAMUSCULAR | Status: AC
  Administered 2013-12-11: 80 mg via INTRAMUSCULAR

## 2013-12-11 NOTE — Patient Instructions (Signed)
F/u in 3 weeks call if you need me before  INCREASE spironolactone to 25 mg TWO Daily, blood pressure is better but still not at goal.  Stop potassium  Depomedrol 80mg  IM today for uncontrolled allergies, and prednisone for 5 days.  You are referred for a bone density test

## 2013-12-14 ENCOUNTER — Ambulatory Visit (HOSPITAL_COMMUNITY)
Admission: RE | Admit: 2013-12-14 | Discharge: 2013-12-14 | Disposition: A | Discharge status: Home / Self Care | Payer: Medicare Other | Source: Ambulatory Visit | Attending: Family Medicine | Admitting: Family Medicine

## 2013-12-14 DIAGNOSIS — Z13820 Encounter for screening for osteoporosis: Secondary | ICD-10-CM | POA: Insufficient documentation

## 2013-12-23 ENCOUNTER — Telehealth: Payer: Self-pay | Admitting: Family Medicine

## 2013-12-23 DIAGNOSIS — I10 Essential (primary) hypertension: Secondary | ICD-10-CM

## 2013-12-23 DIAGNOSIS — R7301 Impaired fasting glucose: Secondary | ICD-10-CM

## 2013-12-23 DIAGNOSIS — E785 Hyperlipidemia, unspecified: Secondary | ICD-10-CM

## 2013-12-23 NOTE — Assessment & Plan Note (Signed)
Improved, but still uncontrolled , increase in spironolactone dose DASH diet and commitment to daily physical activity for a minimum of 30 minutes discussed and encouraged, as a part of hypertension management. The importance of attaining a healthy weight is also discussed.

## 2013-12-23 NOTE — Progress Notes (Signed)
   Subjective:    Patient ID: Kristen Cox, female    DOB: 02-15-1941, 73 y.o.   MRN: [id number]  HPI The PT is here for follow up and re-evaluation of chronic medical conditions, medication management and review of any available recent lab and radiology data.  Preventive health is updated, specifically  Cancer screening and Immunization.   C/o increased and uncontrolled allergy symptoms for the past 1 week wants help . Excessive clear watery nasal drainage States marked improvement in back pain with gabapentin with no adverse s/e     Review of Systems See HPI Denies recent fever or chills. Cx/o  sinus pressure, nasal congestion, denies  ear pain or sore throat. Denies chest congestion, productive cough or wheezing. Denies chest pains, palpitations and leg swelling Denies abdominal pain, nausea, vomiting,diarrhea or constipation.   Denies dysuria, frequency, hesitancy or incontinence. Denies uncontrolled  joint pain, swelling and limitation in mobility. Denies headaches, seizures, numbness, or tingling. Denies depression, anxiety or insomnia. Denies skin break down or rash.        Objective:   Physical Exam BP 154/84  Pulse 64  Resp 16  Wt 154 lb 12.8 oz (70.217 kg)  SpO2 98% Patient alert and oriented and in no cardiopulmonary distress.  HEENT: No facial asymmetry, EOMI, no sinus tenderness,  oropharynx pink and moist.  Neck supple no adenopathy.Nasal mucosa erythematous and edematous with excessive clear watery drainage  Chest: Clear to auscultation bilaterally.  CVS: S1, S2 no murmurs, no S3.  ABD: Soft non tender. Bowel sounds normal.  Ext: No edema  MS: Adequate though reduced  ROM spine, shoulders, hips and knees.  Skin: Intact, no ulcerations or rash noted.  Psych: Good eye contact, normal affect. Memory intact not anxious or depressed appearing.  CNS: CN 2-12 intact, power, tone and sensation normal throughout.        Assessment & Plan:    HYPERTENSION Improved, but still uncontrolled , increase in spironolactone dose DASH diet and commitment to daily physical activity for a minimum of 30 minutes discussed and encouraged, as a part of hypertension management. The importance of attaining a healthy weight is also discussed.   ALLERGIC RHINITIS, SEASONAL Uncontrolled, depo medrol in office followed by 5 day course of prednisone  HYPERLIPIDEMIA Hyperlipidemia:Low fat diet discussed and encouraged.  Updated lab needed at/ before next visit.   SPINAL STENOSIS Marked improvement in pain with gabapentin which is tolerated, remain on current dose of 200mg  daily  IMPAIRED FASTING GLUCOSE Updated lab needed at/ before next visit. Patient educated about the importance of limiting  Carbohydrate intake , the need to commit to daily physical activity for a minimum of 30 minutes , and to commit weight loss. The fact that changes in all these areas will reduce or eliminate all together the development of diabetes is stressed.

## 2013-12-23 NOTE — Assessment & Plan Note (Signed)
Updated lab needed at/ before next visit. Patient educated about the importance of limiting  Carbohydrate intake , the need to commit to daily physical activity for a minimum of 30 minutes , and to commit weight loss. The fact that changes in all these areas will reduce or eliminate all together the development of diabetes is stressed.    

## 2013-12-23 NOTE — Assessment & Plan Note (Signed)
Uncontrolled, depo medrol in office followed by 5 day course of prednisone

## 2013-12-23 NOTE — Telephone Encounter (Signed)
Pt has appt next week. No labs were ordered Ples call her , she needs fasting lipid, cmp and EGFR for the visit. She has IGT and dyslipidemia. Advise her to [place] on day of test so she is not dehydrated, will not affect the result pls order , needs 2 to 3 days before f/u, thnaks Both are past due and I doid review Dr Liliane Channel recent lab also

## 2013-12-23 NOTE — Assessment & Plan Note (Signed)
Hyperlipidemia:Low fat diet discussed and encouraged.  Updated lab needed at/ before next visit.  

## 2013-12-23 NOTE — Assessment & Plan Note (Signed)
Marked improvement in pain with gabapentin which is tolerated, remain on current dose of 200mg  daily

## 2013-12-24 NOTE — Telephone Encounter (Signed)
Patient aware and will go Thursday to have fasting labs

## 2013-12-24 NOTE — Addendum Note (Signed)
Addended by: Eual Fines on: 12/24/2013 08:10 AM   Modules accepted: Orders

## 2013-12-27 LAB — COMPLETE METABOLIC PANEL WITH GFR
ALT: 11 U/L (ref 0–35)
AST: 21 U/L (ref 0–37)
Albumin: 3.7 g/dL (ref 3.5–5.2)
Alkaline Phosphatase: 72 U/L (ref 39–117)
BILIRUBIN TOTAL: 0.5 mg/dL (ref 0.2–1.2)
BUN: 14 mg/dL (ref 6–23)
CHLORIDE: 103 meq/L (ref 96–112)
CO2: 27 mEq/L (ref 19–32)
Calcium: 9.5 mg/dL (ref 8.4–10.5)
Creat: 0.74 mg/dL (ref 0.50–1.10)
GFR, Est African American: >89 mL/min
GFR, Est Non African American: 81 mL/min
Glucose, Bld: 86 mg/dL (ref 70–99)
Potassium: 4.3 mEq/L (ref 3.5–5.3)
Sodium: 137 mEq/L (ref 135–145)
TOTAL PROTEIN: 6.6 g/dL (ref 6.0–8.3)

## 2013-12-27 LAB — LIPID PANEL
Cholesterol: 213 mg/dL — ABNORMAL HIGH (ref 0–200)
HDL: 43 mg/dL (ref >39)
LDL CALC: 143 mg/dL — AB (ref 0–99)
Total CHOL/HDL Ratio: 5 Ratio
Triglycerides: 136 mg/dL (ref <150)
VLDL: 27 mg/dL (ref 0–40)

## 2013-12-30 ENCOUNTER — Other Ambulatory Visit: Payer: Self-pay | Admitting: Family Medicine

## 2014-01-03 ENCOUNTER — Ambulatory Visit (INDEPENDENT_AMBULATORY_CARE_PROVIDER_SITE_OTHER): Payer: Medicare Other | Admitting: Family Medicine

## 2014-01-03 ENCOUNTER — Encounter: Payer: Self-pay | Admitting: Family Medicine

## 2014-01-03 VITALS — BP 130/80 | HR 58 | Resp 16 | Wt 150.0 lb

## 2014-01-03 DIAGNOSIS — I10 Essential (primary) hypertension: Secondary | ICD-10-CM

## 2014-01-03 DIAGNOSIS — M4800 Spinal stenosis, site unspecified: Secondary | ICD-10-CM

## 2014-01-03 DIAGNOSIS — R7301 Impaired fasting glucose: Secondary | ICD-10-CM

## 2014-01-03 DIAGNOSIS — E785 Hyperlipidemia, unspecified: Secondary | ICD-10-CM

## 2014-01-03 MED ORDER — SPIRONOLACTONE 50 MG PO TABS: 50.0000 mg | ORAL_TABLET | Freq: Every day | ORAL | Status: DC

## 2014-01-03 NOTE — Patient Instructions (Signed)
Annual wellness  Mid October, call if you need me before  Faasting lipid, chem & , cBC and hBA1C for October visit  BP excellent, new is spironolactone 50 mg ONE daily, continue other 2  Cholesterol is high, please cut back on fat in diet

## 2014-01-04 NOTE — Progress Notes (Signed)
   Subjective:    Patient ID: Kristen Cox, female    DOB: 17-Jan-1941, 73 y.o.   MRN: [id number]  HPI Pt in for f/u uncontrolled BP. Denies any adverse s/e from medications currently taking, and she feels well. Good pain control with gabapentin   Review of Systems See HPI Denies recent fever or chills. Denies sinus pressure, nasal congestion, ear pain or sore throat. Denies chest congestion, productive cough or wheezing. Denies chest pains, palpitations and leg swelling        Objective:   Physical Exam BP 130/80  Pulse 58  Resp 16  Wt 150 lb (68.04 kg)  SpO2 97% Patient alert and oriented and in no cardiopulmonary distress.  HEENT: No facial asymmetry, EOMI, no sinus tenderness,  oropharynx pink and moist.  Neck supple no adenopathy.  Chest: Clear to auscultation bilaterally.  CVS: S1, S2 no murmurs, no S3.  ABD: Soft non tender.   Ext: No edema   Psych: Good eye contact, normal affect. Memory intact not anxious or depressed appearing.  CNS: CN 2-12 intact, power, normal throughout.        Assessment & Plan:  HYPERTENSION Controlled , no med change Pt also encouraged to continue healthy lifestyle  SPINAL STENOSIS Excellent pain control with gabapentin continue same  HYPERLIPIDEMIA Hyperlipidemia:Low fat diet discussed and encouraged.  Updated lab needed at/ before next visit.

## 2014-01-04 NOTE — Assessment & Plan Note (Signed)
Excellent pain control with gabapentin continue same

## 2014-01-04 NOTE — Assessment & Plan Note (Signed)
Controlled , no med change Pt also encouraged to continue healthy lifestyle

## 2014-01-04 NOTE — Assessment & Plan Note (Signed)
Hyperlipidemia:Low fat diet discussed and encouraged.  Updated lab needed at/ before next visit.  

## 2014-01-16 ENCOUNTER — Other Ambulatory Visit: Payer: Self-pay | Admitting: Family Medicine

## 2014-02-13 ENCOUNTER — Other Ambulatory Visit: Payer: Self-pay | Admitting: Family Medicine

## 2014-05-01 ENCOUNTER — Other Ambulatory Visit: Payer: Self-pay | Admitting: Family Medicine

## 2014-05-13 ENCOUNTER — Other Ambulatory Visit: Payer: Self-pay | Admitting: Family Medicine

## 2014-05-28 ENCOUNTER — Other Ambulatory Visit: Payer: Self-pay | Admitting: Family Medicine

## 2014-06-10 LAB — COMPREHENSIVE METABOLIC PANEL
ALT: 15 U/L (ref 0–35)
AST: 22 U/L (ref 0–37)
Albumin: 4.1 g/dL (ref 3.5–5.2)
Alkaline Phosphatase: 52 U/L (ref 39–117)
BUN: 21 mg/dL (ref 6–23)
CALCIUM: 9.9 mg/dL (ref 8.4–10.5)
CHLORIDE: 106 meq/L (ref 96–112)
CO2: 28 meq/L (ref 19–32)
CREATININE: 0.78 mg/dL (ref 0.50–1.10)
GLUCOSE: 89 mg/dL (ref 70–99)
Potassium: 4.4 mEq/L (ref 3.5–5.3)
Sodium: 141 mEq/L (ref 135–145)
TOTAL PROTEIN: 6.7 g/dL (ref 6.0–8.3)
Total Bilirubin: 0.5 mg/dL (ref 0.2–1.2)

## 2014-06-10 LAB — CBC
HCT: 36.8 % (ref 36.0–46.0)
HEMOGLOBIN: 12.5 g/dL (ref 12.0–15.0)
MCH: 31.3 pg (ref 26.0–34.0)
MCHC: 34 g/dL (ref 30.0–36.0)
MCV: 92.2 fL (ref 78.0–100.0)
Platelets: 253 10*3/uL (ref 150–400)
RBC: 3.99 MIL/uL (ref 3.87–5.11)
RDW: 12.9 % (ref 11.5–15.5)
WBC: 5.4 10*3/uL (ref 4.0–10.5)

## 2014-06-10 LAB — LIPID PANEL
CHOLESTEROL: 180 mg/dL (ref 0–200)
HDL: 52 mg/dL (ref >39)
LDL Cholesterol: 106 mg/dL — ABNORMAL HIGH (ref 0–99)
TRIGLYCERIDES: 108 mg/dL (ref <150)
Total CHOL/HDL Ratio: 3.5 Ratio
VLDL: 22 mg/dL (ref 0–40)

## 2014-06-10 LAB — HEMOGLOBIN A1C
Hgb A1c MFr Bld: 5.7 % — ABNORMAL HIGH (ref <5.7)
Mean Plasma Glucose: 117 mg/dL — ABNORMAL HIGH (ref <117)

## 2014-06-12 ENCOUNTER — Ambulatory Visit (INDEPENDENT_AMBULATORY_CARE_PROVIDER_SITE_OTHER): Payer: Medicare Other | Admitting: Family Medicine

## 2014-06-12 ENCOUNTER — Encounter: Payer: Self-pay | Admitting: Family Medicine

## 2014-06-12 ENCOUNTER — Encounter (INDEPENDENT_AMBULATORY_CARE_PROVIDER_SITE_OTHER): Payer: Self-pay

## 2014-06-12 VITALS — BP 136/72 | HR 68 | Resp 18 | Ht 62.0 in | Wt 154.1 lb

## 2014-06-12 DIAGNOSIS — H547 Unspecified visual loss: Secondary | ICD-10-CM

## 2014-06-12 DIAGNOSIS — I10 Essential (primary) hypertension: Secondary | ICD-10-CM

## 2014-06-12 DIAGNOSIS — M48062 Spinal stenosis, lumbar region with neurogenic claudication: Secondary | ICD-10-CM

## 2014-06-12 DIAGNOSIS — Z23 Encounter for immunization: Secondary | ICD-10-CM

## 2014-06-12 DIAGNOSIS — R7301 Impaired fasting glucose: Secondary | ICD-10-CM

## 2014-06-12 DIAGNOSIS — Z0000 Encounter for general adult medical examination without abnormal findings: Secondary | ICD-10-CM

## 2014-06-12 DIAGNOSIS — E785 Hyperlipidemia, unspecified: Secondary | ICD-10-CM

## 2014-06-12 MED ORDER — GABAPENTIN 100 MG PO CAPS: ORAL_CAPSULE | ORAL | Status: DC

## 2014-06-12 MED ORDER — SPIRONOLACTONE 50 MG PO TABS: 50.0000 mg | ORAL_TABLET | Freq: Every day | ORAL | Status: DC

## 2014-06-12 MED ORDER — PREDNISONE 5 MG PO TABS: 5.0000 mg | ORAL_TABLET | Freq: Two times a day (BID) | ORAL | Status: AC

## 2014-06-12 NOTE — Progress Notes (Signed)
Subjective:    Patient ID: Kristen Cox, female    DOB: 1940/10/31, 73 y.o.   MRN: [id number]  HPI Preventive Screening-Counseling & Management   Patient present here today for a Medicare annual wellness visit.   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 No  Over the past two weeks, have you felt little interest or pleasure in doing things? No  Have you lost interest or pleasure in daily life? No  Do you often feel hopeless? No  Do you cry easily over simple problems? No   Activities of Daily Living  In your present state of health, do you have any difficulty performing the following activities?  Driving?: No Managing money?: No Feeding yourself?:No Getting from bed to chair?:No Climbing a flight of stairs?:No Preparing food and eating?:No Bathing or showering?:No Getting dressed?:No Getting to the toilet?:No Using the toilet?:No Moving around from place to place?: No  Fall Risk Assessment In the past year have you fallen or had a near fall?:No Are you currently taking any medications that make you dizzy?:No   Hearing Difficulties: No Do you often ask people to speak up or repeat themselves?:No Do you experience ringing or noises in your ears?:No Do you have difficulty understanding soft or whispered voices?:No  Cognitive Testing  Alert? Yes Normal Appearance?Yes  Oriented to person? Yes  Place? Yes  Time? Yes  Displays appropriate judgment?Yes  Can read the correct time from a watch face? yes Are you having problems remembering things?No  Advanced Directives have been discussed with the patient?Yes , full code   List the Names of Other Physician/Practitioners you currently use:    Indicate any recent Medical Services you may have received from other than [facility] providers in the past year (date may be approximate).   Assessment:    Annual Wellness Exam   Plan:     Medicare Attestation  I have personally reviewed:  The patient's medical and social history  Their use of alcohol, tobacco or illicit drugs  Their current medications and supplements  The patient's functional ability including ADLs,fall risks, home safety risks, cognitive, and hearing and visual impairment  Diet and physical activities  Evidence for depression or mood disorders  The patient's weight, height, BMI, and visual acuity have been recorded in the chart. I have made referrals, counseling, and provided education to the patient based on review of the above and I have provided the patient with a written personalized care plan for preventive services.      Review of Systems     Objective:   Physical Exam BP 136/72  Pulse 68  Resp 18  Ht 5\' 2"  (1.575 m)  Wt 154 lb 1.9 oz (69.908 kg)  BMI 28.18 kg/m2  SpO2 98%        Assessment & Plan:  Medicare annual wellness visit, subsequent Annual exam as documented. Counseling done  re healthy lifestyle  involving commitment to 150 minutes exercise per week, heart healthy diet, and attaining healthy weight.The importance of adequate sleep also discussed. Regular seat belt use and home safety, is also discussed. Changes in health habits are decided on by the patient with goals and time frames  set for achieving them. Immunization and cancer screening needs are specifically addressed at this visit.   Need for prophylactic vaccination and  inoculation against influenza Vaccine administered at visit.   Reduced vision Reduced vision noted in left eye, last eye exam approx 3 years ago, she is referred for exam

## 2014-06-12 NOTE — Assessment & Plan Note (Signed)
Reduced vision noted in left eye, last eye exam approx 3 years ago, she is referred for exam

## 2014-06-12 NOTE — Patient Instructions (Addendum)
F/u in 4.5 month, call if you need me before  You are referred for eye exam  Congrats on taking flu vaccine  Labs and blood pressure are excellent, no med changes  Prednisone one twice daily # 10 tabs only has been sent in, use as needed, for flare up of pain, but choosing to increase the gabapentin to 3 daily is the first best option   Fasting lipid, and cmp in 5 month  Mammogram is due in January , please schedule  Please be careful not to fall in the home  Please try and get living will in writing, packet given

## 2014-06-12 NOTE — Assessment & Plan Note (Signed)

## 2014-06-12 NOTE — Assessment & Plan Note (Signed)
Vaccine administered at visit.  

## 2014-06-17 ENCOUNTER — Telehealth: Payer: Self-pay

## 2014-06-17 ENCOUNTER — Other Ambulatory Visit: Payer: Self-pay

## 2014-06-17 MED ORDER — INDOMETHACIN 50 MG PO CAPS: ORAL_CAPSULE | ORAL | Status: DC

## 2014-06-17 NOTE — Telephone Encounter (Signed)
This is okay for 1 refill

## 2014-06-17 NOTE — Telephone Encounter (Signed)
Noted and patient aware. 

## 2014-08-09 ENCOUNTER — Other Ambulatory Visit: Payer: Self-pay | Admitting: Family Medicine

## 2014-09-05 ENCOUNTER — Other Ambulatory Visit: Payer: Self-pay | Admitting: Family Medicine

## 2014-09-05 DIAGNOSIS — Z1231 Encounter for screening mammogram for malignant neoplasm of breast: Secondary | ICD-10-CM

## 2014-09-23 ENCOUNTER — Ambulatory Visit (HOSPITAL_COMMUNITY)
Admission: RE | Admit: 2014-09-23 | Discharge: 2014-09-23 | Disposition: A | Discharge status: Home / Self Care | Payer: PPO | Source: Ambulatory Visit | Attending: Family Medicine | Admitting: Family Medicine

## 2014-09-23 DIAGNOSIS — Z1231 Encounter for screening mammogram for malignant neoplasm of breast: Secondary | ICD-10-CM | POA: Insufficient documentation

## 2014-11-08 LAB — LIPID PANEL
Cholesterol: 171 mg/dL (ref 0–200)
HDL: 59 mg/dL (ref >=46)
LDL CALC: 93 mg/dL (ref 0–99)
Total CHOL/HDL Ratio: 2.9 Ratio
Triglycerides: 93 mg/dL (ref <150)
VLDL: 19 mg/dL (ref 0–40)

## 2014-11-08 LAB — COMPREHENSIVE METABOLIC PANEL
ALT: 10 U/L (ref 0–35)
AST: 17 U/L (ref 0–37)
Albumin: 4.2 g/dL (ref 3.5–5.2)
Alkaline Phosphatase: 51 U/L (ref 39–117)
BUN: 29 mg/dL — ABNORMAL HIGH (ref 6–23)
CO2: 24 meq/L (ref 19–32)
Calcium: 10.3 mg/dL (ref 8.4–10.5)
Chloride: 106 mEq/L (ref 96–112)
Creat: 0.9 mg/dL (ref 0.50–1.10)
Glucose, Bld: 97 mg/dL (ref 70–99)
Potassium: 4.7 mEq/L (ref 3.5–5.3)
SODIUM: 139 meq/L (ref 135–145)
TOTAL PROTEIN: 7 g/dL (ref 6.0–8.3)
Total Bilirubin: 0.5 mg/dL (ref 0.2–1.2)

## 2014-11-13 ENCOUNTER — Ambulatory Visit (INDEPENDENT_AMBULATORY_CARE_PROVIDER_SITE_OTHER): Payer: PPO | Admitting: Family Medicine

## 2014-11-13 ENCOUNTER — Other Ambulatory Visit: Payer: Self-pay

## 2014-11-13 ENCOUNTER — Encounter: Payer: Self-pay | Admitting: Family Medicine

## 2014-11-13 VITALS — BP 138/80 | HR 65 | Resp 16 | Ht 62.0 in | Wt 160.0 lb

## 2014-11-13 DIAGNOSIS — M4806 Spinal stenosis, lumbar region: Secondary | ICD-10-CM

## 2014-11-13 DIAGNOSIS — M48062 Spinal stenosis, lumbar region with neurogenic claudication: Secondary | ICD-10-CM

## 2014-11-13 DIAGNOSIS — I10 Essential (primary) hypertension: Secondary | ICD-10-CM

## 2014-11-13 DIAGNOSIS — R7301 Impaired fasting glucose: Secondary | ICD-10-CM

## 2014-11-13 DIAGNOSIS — E785 Hyperlipidemia, unspecified: Secondary | ICD-10-CM | POA: Diagnosis not present

## 2014-11-13 DIAGNOSIS — J301 Allergic rhinitis due to pollen: Secondary | ICD-10-CM | POA: Diagnosis not present

## 2014-11-13 DIAGNOSIS — Z23 Encounter for immunization: Secondary | ICD-10-CM | POA: Insufficient documentation

## 2014-11-13 MED ORDER — ATENOLOL 50 MG PO TABS: 50.0000 mg | ORAL_TABLET | Freq: Every day | ORAL | Status: DC

## 2014-11-13 MED ORDER — GABAPENTIN 100 MG PO CAPS: ORAL_CAPSULE | ORAL | Status: DC

## 2014-11-13 MED ORDER — SPIRONOLACTONE 50 MG PO TABS: 50.0000 mg | ORAL_TABLET | Freq: Every day | ORAL | Status: DC

## 2014-11-13 MED ORDER — IBUPROFEN 800 MG PO TABS: ORAL_TABLET | ORAL | Status: DC

## 2014-11-13 MED ORDER — AMLODIPINE BESYLATE 10 MG PO TABS: ORAL_TABLET | ORAL | Status: DC

## 2014-11-13 NOTE — Assessment & Plan Note (Signed)
Controlled, no change in medication No need for flonase currently

## 2014-11-13 NOTE — Assessment & Plan Note (Signed)
Requests limited supply of ibuprofen for flares understands the risks will limit to 30 tabs per 4 mionth, takes gabapentin 200mg  at bedtime will change script

## 2014-11-13 NOTE — Assessment & Plan Note (Signed)
Improved. Hyperlipidemia:Low fat diet discussed and encouraged.  CMP Latest Ref Rng 11/08/2014 06/10/2014 12/27/2013  Glucose 70 - 99 mg/dL 97 89 86  BUN 6 - 23 mg/dL 29(H) 21 14  Creatinine 0.50 - 1.10 mg/dL 0.90 0.78 0.74  Sodium 135 - 145 mEq/L 139 141 137  Potassium 3.5 - 5.3 mEq/L 4.7 4.4 4.3  Chloride 96 - 112 mEq/L 106 106 103  CO2 19 - 32 mEq/L 24 28 27   Calcium 8.4 - 10.5 mg/dL 10.3 9.9 9.5  Total Protein 6.0 - 8.3 g/dL 7.0 6.7 6.6  Total Bilirubin 0.2 - 1.2 mg/dL 0.5 0.5 0.5  Alkaline Phos 39 - 117 U/L 51 52 72  AST 0 - 37 U/L 17 22 21   ALT 0 - 35 U/L 10 15 11     Lipid Panel     Component Value Date/Time   CHOL 171 11/08/2014 0843   TRIG 93 11/08/2014 0843   HDL 59 11/08/2014 0843   CHOLHDL 2.9 11/08/2014 0843   VLDL 19 11/08/2014 0843   LDLCALC 93 11/08/2014 0843

## 2014-11-13 NOTE — Assessment & Plan Note (Addendum)
Slightly increased will reduce sweet intake  Check HBA1C in 4 months Patient educated about the importance of limiting  Carbohydrate intake , the need to commit to daily physical activity for a minimum of 30 minutes , and to commit weight loss. The fact that changes in all these areas will reduce or eliminate all together the development of diabetes is stressed.   Diabetic Labs Latest Ref Rng 11/08/2014 06/10/2014 12/27/2013 12/10/2013 05/15/2013  HbA1c <5.7 % - 5.7(H) - - -  Chol 0 - 200 mg/dL 171 180 213(H) - -  HDL >=46 mg/dL 59 52 43 - -  Calc LDL 0 - 99 mg/dL 93 106(H) 143(H) - -  Triglycerides <150 mg/dL 93 108 136 - -  Creatinine 0.50 - 1.10 mg/dL 0.90 0.78 0.74 0.74 0.72   BP/Weight 11/13/2014 06/12/2014 01/03/2014 12/11/2013 12/03/2013 11/19/2013 64/11/345  Systolic BP 425 956 387 [phone]  Diastolic BP 80 72 80 84 80 82 70  Wt. (Lbs) 160 154.12 150 154.8 153 152.8 154  BMI 29.26 28.18 27.43 28.31 27.98 27.94 28.16   No flowsheet data found.

## 2014-11-13 NOTE — Assessment & Plan Note (Signed)
After obtaining informed consent, the vaccine is  administered by LPN.  

## 2014-11-13 NOTE — Patient Instructions (Signed)
Annual physical exam in 4 month, call if you need me before  Blood pressure and cholesterol are excellent  Pls change eating  So that sugar improves and you lose about 5 pounds   Continue regular exercise  Med adjustments for back pain as discussed  Prevnar today  HBa1C, fasting chem 7 and EGFR,

## 2014-11-13 NOTE — Assessment & Plan Note (Signed)
Controlled, no change in medication DASH diet and commitment to daily physical activity for a minimum of 30 minutes discussed and encouraged, as a part of hypertension management. The importance of attaining a healthy weight is also discussed.  BP/Weight 11/13/2014 06/12/2014 01/03/2014 12/11/2013 12/03/2013 11/19/2013 34/0/3524  Systolic BP 818 590 931 121 624 469 507  Diastolic BP 80 72 80 84 80 82 70  Wt. (Lbs) 160 154.12 150 154.8 153 152.8 154  BMI 29.26 28.18 27.43 28.31 27.98 27.94 28.16    CMP Latest Ref Rng 11/08/2014 06/10/2014 12/27/2013  Glucose 70 - 99 mg/dL 97 89 86  BUN 6 - 23 mg/dL 29(H) 21 14  Creatinine 0.50 - 1.10 mg/dL 0.90 0.78 0.74  Sodium 135 - 145 mEq/L 139 141 137  Potassium 3.5 - 5.3 mEq/L 4.7 4.4 4.3  Chloride 96 - 112 mEq/L 106 106 103  CO2 19 - 32 mEq/L 24 28 27   Calcium 8.4 - 10.5 mg/dL 10.3 9.9 9.5  Total Protein 6.0 - 8.3 g/dL 7.0 6.7 6.6  Total Bilirubin 0.2 - 1.2 mg/dL 0.5 0.5 0.5  Alkaline Phos 39 - 117 U/L 51 52 72  AST 0 - 37 U/L 17 22 21   ALT 0 - 35 U/L 10 15 11

## 2014-11-16 NOTE — Progress Notes (Signed)
Subjective:    Patient ID: Kristen Cox, female    DOB: Sep 06, 1940, 74 y.o.   MRN: [id number]  HPI The PT is here for follow up and re-evaluation of chronic medical conditions, medication management and review of any available recent lab and radiology data.  Preventive health is updated, specifically  Cancer screening and Immunization.   Questions or concerns regarding consultations or procedures which the PT has had in the interim are  addressed. The PT denies any adverse reactions to current medications since the last visit.  There are no new concerns.  There are no specific complaints , does state however that she has not been as diligent with her diet , but will change this, still supporting her niece who recently lost her daughter      Review of Systems See HPI Denies recent fever or chills. Denies sinus pressure, nasal congestion, ear pain or sore throat. Denies chest congestion, productive cough or wheezing. Denies chest pains, palpitations and leg swelling Denies abdominal pain, nausea, vomiting,diarrhea or constipation.   Denies dysuria, frequency, hesitancy or incontinence. Denies uncontrolled  joint pain, swelling and limitation in mobility.Uses 2 and not 3 gabapentin tablets, will change script  Denies headaches, seizures, numbness, or tingling. Denies depression, anxiety or insomnia. Denies skin break down or rash.        Objective:   Physical Exam BP 138/80 mmHg  Pulse 65  Resp 16  Ht 5\' 2"  (1.575 m)  Wt 160 lb (72.576 kg)  BMI 29.26 kg/m2  SpO2 97% Patient alert and oriented and in no cardiopulmonary distress.  HEENT: No facial asymmetry, EOMI,   oropharynx pink and moist.  Neck supple no JVD, no mass.  Chest: Clear to auscultation bilaterally.  CVS: S1, S2 no murmurs, no S3.Regular rate.  ABD: Soft non tender.   Ext: No edema  MS: Adequate ROM spine, shoulders, hips and knees.  Skin: Intact, no ulcerations or rash noted.  Psych: Good eye  contact, normal affect. Memory intact not anxious or depressed appearing.  CNS: CN 2-12 intact, power,  normal throughout.no focal deficits noted.        Assessment & Plan:  Essential hypertension Controlled, no change in medication DASH diet and commitment to daily physical activity for a minimum of 30 minutes discussed and encouraged, as a part of hypertension management. The importance of attaining a healthy weight is also discussed.  BP/Weight 11/13/2014 06/12/2014 01/03/2014 12/11/2013 12/03/2013 11/19/2013 03/23/4480  Systolic BP 856 314 970 263 785 885 027  Diastolic BP 80 72 80 84 80 82 70  Wt. (Lbs) 160 154.12 150 154.8 153 152.8 154  BMI 29.26 28.18 27.43 28.31 27.98 27.94 28.16    CMP Latest Ref Rng 11/08/2014 06/10/2014 12/27/2013  Glucose 70 - 99 mg/dL 97 89 86  BUN 6 - 23 mg/dL 29(H) 21 14  Creatinine 0.50 - 1.10 mg/dL 0.90 0.78 0.74  Sodium 135 - 145 mEq/L 139 141 137  Potassium 3.5 - 5.3 mEq/L 4.7 4.4 4.3  Chloride 96 - 112 mEq/L 106 106 103  CO2 19 - 32 mEq/L 24 28 27   Calcium 8.4 - 10.5 mg/dL 10.3 9.9 9.5  Total Protein 6.0 - 8.3 g/dL 7.0 6.7 6.6  Total Bilirubin 0.2 - 1.2 mg/dL 0.5 0.5 0.5  Alkaline Phos 39 - 117 U/L 51 52 72  AST 0 - 37 U/L 17 22 21   ALT 0 - 35 U/L 10 15 11          ALLERGIC RHINITIS,  SEASONAL Controlled, no change in medication No need for flonase currently   IMPAIRED FASTING GLUCOSE Slightly increased will reduce sweet intake  Check HBA1C in 4 months Patient educated about the importance of limiting  Carbohydrate intake , the need to commit to daily physical activity for a minimum of 30 minutes , and to commit weight loss. The fact that changes in all these areas will reduce or eliminate all together the development of diabetes is stressed.   Diabetic Labs Latest Ref Rng 11/08/2014 06/10/2014 12/27/2013 12/10/2013 05/15/2013  HbA1c <5.7 % - 5.7(H) - - -  Chol 0 - 200 mg/dL 171 180 213(H) - -  HDL >=46 mg/dL 59 52 43 - -  Calc LDL 0 - 99  mg/dL 93 106(H) 143(H) - -  Triglycerides <150 mg/dL 93 108 136 - -  Creatinine 0.50 - 1.10 mg/dL 0.90 0.78 0.74 0.74 0.72   BP/Weight 11/13/2014 06/12/2014 01/03/2014 12/11/2013 12/03/2013 11/19/2013 76/08/9507  Systolic BP 326 712 458 099 833 825 053  Diastolic BP 80 72 80 84 80 82 70  Wt. (Lbs) 160 154.12 150 154.8 153 152.8 154  BMI 29.26 28.18 27.43 28.31 27.98 27.94 28.16   No flowsheet data found.      Hyperlipemia Improved. Hyperlipidemia:Low fat diet discussed and encouraged.  CMP Latest Ref Rng 11/08/2014 06/10/2014 12/27/2013  Glucose 70 - 99 mg/dL 97 89 86  BUN 6 - 23 mg/dL 29(H) 21 14  Creatinine 0.50 - 1.10 mg/dL 0.90 0.78 0.74  Sodium 135 - 145 mEq/L 139 141 137  Potassium 3.5 - 5.3 mEq/L 4.7 4.4 4.3  Chloride 96 - 112 mEq/L 106 106 103  CO2 19 - 32 mEq/L 24 28 27   Calcium 8.4 - 10.5 mg/dL 10.3 9.9 9.5  Total Protein 6.0 - 8.3 g/dL 7.0 6.7 6.6  Total Bilirubin 0.2 - 1.2 mg/dL 0.5 0.5 0.5  Alkaline Phos 39 - 117 U/L 51 52 72  AST 0 - 37 U/L 17 22 21   ALT 0 - 35 U/L 10 15 11     Lipid Panel     Component Value Date/Time   CHOL 171 11/08/2014 0843   TRIG 93 11/08/2014 0843   HDL 59 11/08/2014 0843   CHOLHDL 2.9 11/08/2014 0843   VLDL 19 11/08/2014 0843   LDLCALC 93 11/08/2014 0843         Spinal stenosis, lumbar region, with neurogenic claudication Requests limited supply of ibuprofen for flares understands the risks will limit to 30 tabs per 4 mionth, takes gabapentin 200mg  at bedtime will change script   Need for vaccination with 13-polyvalent pneumococcal conjugate vaccine After obtaining informed consent, the vaccine is  administered by LPN.

## 2015-03-20 LAB — BASIC METABOLIC PANEL WITH GFR
BUN: 22 mg/dL (ref 7–25)
CO2: 26 mEq/L (ref 20–31)
Calcium: 10.3 mg/dL (ref 8.6–10.4)
Chloride: 107 mEq/L (ref 98–110)
Creat: 0.87 mg/dL (ref 0.60–0.93)
GFR, Est African American: 76 mL/min (ref >=60)
GFR, Est Non African American: 66 mL/min (ref >=60)
GLUCOSE: 98 mg/dL (ref 65–99)
POTASSIUM: 5 meq/L (ref 3.5–5.3)
Sodium: 141 mEq/L (ref 135–146)

## 2015-03-20 LAB — HEMOGLOBIN A1C
Hgb A1c MFr Bld: 5.9 % — ABNORMAL HIGH (ref <5.7)
Mean Plasma Glucose: 123 mg/dL — ABNORMAL HIGH (ref <117)

## 2015-03-24 ENCOUNTER — Encounter: Payer: Self-pay | Admitting: Family Medicine

## 2015-03-24 ENCOUNTER — Ambulatory Visit (INDEPENDENT_AMBULATORY_CARE_PROVIDER_SITE_OTHER): Payer: PPO | Admitting: Family Medicine

## 2015-03-24 VITALS — BP 138/78 | HR 60 | Resp 18 | Ht 62.0 in | Wt 159.1 lb

## 2015-03-24 DIAGNOSIS — Z1211 Encounter for screening for malignant neoplasm of colon: Secondary | ICD-10-CM | POA: Diagnosis not present

## 2015-03-24 DIAGNOSIS — B351 Tinea unguium: Secondary | ICD-10-CM

## 2015-03-24 DIAGNOSIS — I10 Essential (primary) hypertension: Secondary | ICD-10-CM

## 2015-03-24 DIAGNOSIS — Z0000 Encounter for general adult medical examination without abnormal findings: Secondary | ICD-10-CM | POA: Diagnosis not present

## 2015-03-24 DIAGNOSIS — M549 Dorsalgia, unspecified: Secondary | ICD-10-CM | POA: Insufficient documentation

## 2015-03-24 DIAGNOSIS — M5417 Radiculopathy, lumbosacral region: Secondary | ICD-10-CM

## 2015-03-24 DIAGNOSIS — M5416 Radiculopathy, lumbar region: Secondary | ICD-10-CM

## 2015-03-24 DIAGNOSIS — E785 Hyperlipidemia, unspecified: Secondary | ICD-10-CM

## 2015-03-24 LAB — HEMOCCULT GUIAC POC 1CARD (OFFICE): FECAL OCCULT BLD: NEGATIVE

## 2015-03-24 MED ORDER — AMLODIPINE BESYLATE 10 MG PO TABS: ORAL_TABLET | ORAL | Status: DC

## 2015-03-24 MED ORDER — GABAPENTIN 300 MG PO CAPS: 300.0000 mg | ORAL_CAPSULE | Freq: Every day | ORAL | Status: DC

## 2015-03-24 MED ORDER — TERBINAFINE HCL 250 MG PO TABS: 250.0000 mg | ORAL_TABLET | Freq: Every day | ORAL | Status: DC

## 2015-03-24 MED ORDER — RANITIDINE HCL 150 MG PO TABS
150.0000 mg | ORAL_TABLET | Freq: Two times a day (BID) | ORAL | Status: DC
Start: 2015-03-24 — End: 2015-07-03

## 2015-03-24 MED ORDER — KETOROLAC TROMETHAMINE 60 MG/2ML IM SOLN
60.0000 mg | Freq: Once | INTRAMUSCULAR | Status: AC
  Administered 2015-03-24: 60 mg via INTRAMUSCULAR

## 2015-03-24 MED ORDER — METHYLPREDNISOLONE ACETATE 80 MG/ML IJ SUSP
80.0000 mg | Freq: Once | INTRAMUSCULAR | Status: AC
Start: 2015-03-24 — End: 2015-03-24
  Administered 2015-03-24: 80 mg via INTRAMUSCULAR

## 2015-03-24 MED ORDER — PREDNISONE 5 MG (21) PO TBPK: 5.0000 mg | ORAL_TABLET | ORAL | Status: DC

## 2015-03-24 MED ORDER — IBUPROFEN 600 MG PO TABS: ORAL_TABLET | ORAL | Status: DC

## 2015-03-24 MED ORDER — SIMVASTATIN 10 MG PO TABS: 10.0000 mg | ORAL_TABLET | Freq: Every day | ORAL | Status: DC

## 2015-03-24 MED ORDER — SPIRONOLACTONE 50 MG PO TABS: 50.0000 mg | ORAL_TABLET | Freq: Every day | ORAL | Status: DC

## 2015-03-24 NOTE — Assessment & Plan Note (Signed)

## 2015-03-24 NOTE — Assessment & Plan Note (Addendum)
2 week flare disturbing sleep and function Uncontrolled.Toradol and depo medrol administered IM in the office , to be followed by a short course of oral prednisone and NSAIDS. Also dose increase in gabapentin

## 2015-03-24 NOTE — Patient Instructions (Addendum)
F/u in 3.5 months, call if ypu need me before  Injections in office and medication sent for back pain, call back in 2 weeks if no better for referral for epidural  Increase gabapentin to THREE 100 mg capsules at bedtime,new script is for 300mg  oNE at bedtiime   Tablets sent for fungal toenail infection   Fasting lipid, cmp in 3.5 month  New for fungal toenail infection is terbinafine tablet , take daily for 3 month  Pls cut back on sweets , blood sugar and weight have crept up just a little bit  Thanks for choosing [facility], we consider it a privelige to serve you. Hope you feel better soon!

## 2015-03-30 NOTE — Progress Notes (Signed)
   Subjective:    Patient ID: Kristen Cox, female    DOB: September 21, 1940, 74 y.o.   MRN: [id number]  HPI Patient is in for annual physical exam.  2 to 3 week h/o increased and uncontrolled low back pain radiiting down right leg to toes, no known aggravating factor, has benefited in the past from epidural. Recent labs are reviewed. Immunization is reviewed , and  updated if needed.    Review of Systems See HPI     Objective:   Physical Exam BP 138/78 mmHg  Pulse 60  Resp 18  Ht 5\' 2"  (1.575 m)  Wt 159 lb 1.9 oz (72.176 kg)  BMI 29.10 kg/m2  SpO2 96%   Pleasant well nourished female, alert and oriented x 3, in no cardio-pulmonary distress.Pt in severe pain, limiting mobility Afebrile. HEENT No facial trauma or asymetry. Sinuses non tender.  Extra occullar muscles intact,  External ears normal, tympanic membranes clear. Oropharynx moist, no exudate, fair dentition. Neck: supple, no adenopathy,JVD or thyromegaly.No bruits.  Chest: Clear to ascultation bilaterally.No crackles or wheezes. Non tender to palpation  Breast: No asymetry,no masses or lumps. No tenderness. No nipple discharge or inversion. No axillary or supraclavicular adenopathy  Cardiovascular system; Heart sounds normal,  S1 and  S2 ,no S3.  No murmur, or thrill. Apical beat not displaced Peripheral pulses normal.  Abdomen: Soft, non tender, no organomegaly or masses. No bruits. Bowel sounds normal. No guarding, tenderness or rebound.  Rectal:  Normal sphincter tone. No mass.No rectal masses.  Guaiac negative stool.  GU: Unable to perform as patient was in too much pain for this exam  Musculoskeletal exam: Markedly decreased  ROM of lumbar  spine, right hip ,and knee. No deformity ,swelling or crepitus noted. No muscle wasting or atrophy.   Neurologic: Cranial nerves 2 to 12 intact. Power, tone ,sensation and reflexes normal throughout. No disturbance in gait. No tremor.  Skin: Intact,  no ulceration, erythema , scaling or rash noted. Pigmentation normal throughout Severe onychomycosis involving primarily both great toenails Psych; Normal mood and affect. Judgement and concentration normal         Assessment & Plan:  Annual physical exam Annual exam as documented. Counseling done  re healthy lifestyle involving commitment to 150 minutes exercise per week, heart healthy diet, and attaining healthy weight.The importance of adequate sleep also discussed. Regular seat belt use and home safety, is also discussed. Changes in health habits are decided on by the patient with goals and time frames  set for achieving them. Immunization and cancer screening needs are specifically addressed at this visit.   Lumbar back pain with radiculopathy affecting right lower extremity 2 week flare disturbing sleep and function Uncontrolled.Toradol and depo medrol administered IM in the office , to be followed by a short course of oral prednisone and NSAIDS. Also dose increase in gabapentin   Onychomycosis severe bilateral onychomycosis affecting great toes primarily, oral treatment x 3 months prescribed

## 2015-03-30 NOTE — Assessment & Plan Note (Signed)
severe bilateral onychomycosis affecting great toes primarily, oral treatment x 3 months prescribed

## 2015-04-25 ENCOUNTER — Other Ambulatory Visit: Payer: Self-pay | Admitting: Family Medicine

## 2015-05-25 ENCOUNTER — Other Ambulatory Visit: Payer: Self-pay | Admitting: Family Medicine

## 2015-05-26 ENCOUNTER — Other Ambulatory Visit: Payer: Self-pay | Admitting: Family Medicine

## 2015-05-26 ENCOUNTER — Telehealth: Payer: Self-pay | Admitting: Family Medicine

## 2015-05-26 DIAGNOSIS — M5440 Lumbago with sciatica, unspecified side: Secondary | ICD-10-CM

## 2015-05-26 NOTE — Telephone Encounter (Signed)
C/o uncontrolled lower extremity pain for months, worse this past weekend, despite the fact she had IM injection at last visit, pain in this leg has not eased up. Has benefited in past from epidural, will refer asap

## 2015-05-28 ENCOUNTER — Ambulatory Visit
Admission: RE | Admit: 2015-05-28 | Discharge: 2015-05-28 | Disposition: A | Discharge status: Home / Self Care | Payer: PPO | Source: Ambulatory Visit | Attending: Family Medicine | Admitting: Family Medicine

## 2015-05-28 DIAGNOSIS — M5440 Lumbago with sciatica, unspecified side: Secondary | ICD-10-CM

## 2015-05-28 MED ORDER — METHYLPREDNISOLONE ACETATE 40 MG/ML INJ SUSP (RADIOLOG
120.0000 mg | Freq: Once | INTRAMUSCULAR | Status: AC
  Administered 2015-05-28: 120 mg via EPIDURAL

## 2015-05-28 MED ORDER — IOHEXOL 180 MG/ML  SOLN
1.0000 mL | Freq: Once | INTRAMUSCULAR | Status: DC | PRN
Start: 2015-05-28 — End: 2015-05-29
  Administered 2015-05-28: 1 mL via EPIDURAL

## 2015-05-28 NOTE — Discharge Instructions (Signed)

## 2015-06-13 ENCOUNTER — Telehealth: Payer: Self-pay

## 2015-06-13 DIAGNOSIS — M109 Gout, unspecified: Secondary | ICD-10-CM

## 2015-06-13 DIAGNOSIS — M5416 Radiculopathy, lumbar region: Secondary | ICD-10-CM

## 2015-06-13 NOTE — Telephone Encounter (Addendum)
pls send indomethacin 50 mg tab # 15 as prescribed in 08/2013, and pred 5 mg dose pack #21 only, she also needs a uric acid level drawn, none since 2013, and may need to resume allopurinol, can get the level drawn next week, not stat

## 2015-06-13 NOTE — Telephone Encounter (Signed)
Patient aware and meds/lab sent

## 2015-06-13 NOTE — Telephone Encounter (Signed)
States she has gout in her left foot again and its hurting and swollen since yesterday. Wants something called in to [facility]

## 2015-06-13 NOTE — Addendum Note (Signed)
Addended by: Eual Fines on: 06/13/2015 11:14 AM   Modules accepted: Orders, Medications

## 2015-06-16 ENCOUNTER — Telehealth: Payer: Self-pay | Admitting: *Deleted

## 2015-06-16 ENCOUNTER — Other Ambulatory Visit: Payer: Self-pay

## 2015-06-16 MED ORDER — INDOMETHACIN 50 MG PO CAPS: ORAL_CAPSULE | ORAL | Status: DC

## 2015-06-16 MED ORDER — PREDNISONE 5 MG (21) PO TBPK
ORAL_TABLET | ORAL | Status: DC
Start: 2015-06-16 — End: 2015-07-03

## 2015-06-16 NOTE — Telephone Encounter (Signed)
Patient called requesting a nurse to call her back about her feet, patient states they are better and she has kept them propped up and has been taking ibuprofen patient said her RX was never called in to the pharmacy [phone]

## 2015-06-16 NOTE — Telephone Encounter (Signed)
Patient aware.   Will have uric acid drawn on 11/4 with other labs that she has ordered.

## 2015-06-16 NOTE — Telephone Encounter (Signed)
Medication sent.

## 2015-06-28 LAB — COMPREHENSIVE METABOLIC PANEL
ALK PHOS: 56 U/L (ref 33–130)
ALT: 10 U/L (ref 6–29)
AST: 18 U/L (ref 10–35)
Albumin: 3.6 g/dL (ref 3.6–5.1)
BUN: 13 mg/dL (ref 7–25)
CO2: 29 mmol/L (ref 20–31)
Calcium: 9.3 mg/dL (ref 8.6–10.4)
Chloride: 106 mmol/L (ref 98–110)
Creat: 0.86 mg/dL (ref 0.60–0.93)
Glucose, Bld: 94 mg/dL (ref 65–99)
POTASSIUM: 4.6 mmol/L (ref 3.5–5.3)
Sodium: 141 mmol/L (ref 135–146)
TOTAL PROTEIN: 6.1 g/dL (ref 6.1–8.1)
Total Bilirubin: 0.3 mg/dL (ref 0.2–1.2)

## 2015-06-28 LAB — LIPID PANEL
CHOLESTEROL: 153 mg/dL (ref 125–200)
HDL: 47 mg/dL (ref >=46)
LDL Cholesterol: 92 mg/dL (ref <130)
Total CHOL/HDL Ratio: 3.3 Ratio (ref <=5.0)
Triglycerides: 71 mg/dL (ref <150)
VLDL: 14 mg/dL (ref <30)

## 2015-06-28 LAB — URIC ACID: URIC ACID, SERUM: 6.8 mg/dL (ref 2.4–7.0)

## 2015-06-30 MED ORDER — ALLOPURINOL 300 MG PO TABS: 300.0000 mg | ORAL_TABLET | Freq: Every day | ORAL | Status: DC

## 2015-06-30 NOTE — Addendum Note (Signed)
Addended by: Denman George B on: 06/30/2015 04:53 PM   Modules accepted: Orders

## 2015-07-03 ENCOUNTER — Ambulatory Visit (INDEPENDENT_AMBULATORY_CARE_PROVIDER_SITE_OTHER): Payer: PPO | Admitting: Family Medicine

## 2015-07-03 ENCOUNTER — Encounter: Payer: Self-pay | Admitting: Family Medicine

## 2015-07-03 VITALS — BP 124/74 | HR 64 | Temp 98.8°F | Resp 16 | Ht 62.0 in | Wt 158.0 lb

## 2015-07-03 DIAGNOSIS — I10 Essential (primary) hypertension: Secondary | ICD-10-CM | POA: Diagnosis not present

## 2015-07-03 DIAGNOSIS — E790 Hyperuricemia without signs of inflammatory arthritis and tophaceous disease: Secondary | ICD-10-CM | POA: Diagnosis not present

## 2015-07-03 DIAGNOSIS — E785 Hyperlipidemia, unspecified: Secondary | ICD-10-CM

## 2015-07-03 DIAGNOSIS — M109 Gout, unspecified: Secondary | ICD-10-CM

## 2015-07-03 MED ORDER — ATENOLOL 50 MG PO TABS: ORAL_TABLET | ORAL | Status: DC

## 2015-07-03 NOTE — Progress Notes (Signed)
   Subjective:    Patient ID: Kristen Cox, female    DOB: [birth year], 74 y.o.   MRN: [id number]  HPI   Kristen Cox     MRN: [id number]      DOB: 09-12-1940   HPI Kristen Cox is here for follow up and re-evaluation of chronic medical conditions, medication management and review of any available recent lab and radiology data.  Preventive health is updated, specifically  Cancer screening and Immunization.   Questions or concerns regarding consultations or procedures which the PT has had in the interim are  addressed. The PT denies any adverse reactions to current medications since the last visit.  C/o excessive nasal drainage , sneezing, watery eyes for past 4 days ROS Denies recent fever or chills.  Denies chest congestion, productive cough or wheezing. Denies chest pains, palpitations and leg swelling Denies abdominal pain, nausea, vomiting,diarrhea or constipation.   Denies dysuria, frequency, hesitancy or incontinence. Denies joint pain, swelling and limitation in mobility. Denies headaches, seizures, numbness, or tingling. Denies depression, anxiety or insomnia. Denies skin break down or rash.   PE  BP 124/74 mmHg  Pulse 64  Temp(Src) 98.8 F (37.1 C) (Oral)  Resp 16  Ht 5\' 2"  (1.575 m)  Wt 158 lb (71.668 kg)  BMI 28.89 kg/m2  SpO2 98%  Patient alert and oriented and in no cardiopulmonary distress.  HEENT: No facial asymmetry, EOMI,   oropharynx pink and moist.  Neck supple no JVD, no mass. Excessive clear nasal drainage and sneezing, erythema and edema of nasal mucosa, no sinus tenderness, TM clear   Chest: Clear to auscultation bilaterally.  CVS: S1, S2 no murmurs, no S3.Regular rate.  ABD: Soft non tender.   Ext: No edema  MS: Adequate ROM spine, shoulders, hips and knees.  Skin: Intact, no ulcerations or rash noted.  Psych: Good eye contact, normal affect. Memory intact not anxious or depressed appearing.  CNS: CN 2-12 intact, power,  normal  throughout.no focal deficits noted.   Assessment & Plan   Essential hypertension Controlled, no change in medication DASH diet and commitment to daily physical activity for a minimum of 30 minutes discussed and encouraged, as a part of hypertension management. The importance of attaining a healthy weight is also discussed.  BP/Weight 07/03/2015 05/28/2015 03/24/2015 11/13/2014 06/12/2014 01/03/2014 [id number]  Systolic BP A999333 0000000 0000000 0000000 [id number]  Diastolic BP 74 80 78 80 72 80 84  Wt. (Lbs) 158 - 159.12 160 154.12 150 154.8  BMI 28.89 - 29.1 29.26 28.18 27.43 28.31        Hyperlipemia Controlled, no change in medication Hyperlipidemia:Low fat diet discussed and encouraged.   Lipid Panel  Lab Results  Component Value Date   CHOL 153 06/28/2015   HDL 47 06/28/2015   LDLCALC 92 06/28/2015   TRIG 71 06/28/2015   CHOLHDL 3.3 06/28/2015        GOUT Had recent flare, and , uric acid level elevated , needs to resume allopurinol daily  Allergic rhinitis Uncontrolled, short course of oral prednisone, sudafed, then commitment to daily meds as needed  Hyperuricemia Needs to commit to daily alloipurinol       Review of Systems     Objective:   Physical Exam        Assessment & Plan:

## 2015-07-03 NOTE — Patient Instructions (Addendum)
F/u in 4.5 month, call if you need me before  Return early Dec for flu vaccine please   Labs are excellent, no med change  Symptoms are from uncontrolled allergies  Take prednisone that you have one twice daily for  3 days  Start once daily singulair, an allergy pill we will send in   Take sudafed one daily for 3 days  EXCELLENT labs , blood pressure and exam  CBC, TSH, Uric acid level, kidney function in 4.5 month, non fast

## 2015-07-06 DIAGNOSIS — E790 Hyperuricemia without signs of inflammatory arthritis and tophaceous disease: Secondary | ICD-10-CM | POA: Insufficient documentation

## 2015-07-06 NOTE — Assessment & Plan Note (Signed)
Uncontrolled, short course of oral prednisone, sudafed, then commitment to daily meds as needed

## 2015-07-06 NOTE — Assessment & Plan Note (Signed)
Had recent flare, and , uric acid level elevated , needs to resume allopurinol daily

## 2015-07-06 NOTE — Assessment & Plan Note (Signed)
Needs to commit to daily alloipurinol

## 2015-07-06 NOTE — Assessment & Plan Note (Signed)
Controlled, no change in medication DASH diet and commitment to daily physical activity for a minimum of 30 minutes discussed and encouraged, as a part of hypertension management. The importance of attaining a healthy weight is also discussed.  BP/Weight 07/03/2015 05/28/2015 03/24/2015 11/13/2014 06/12/2014 01/03/2014 [id number]  Systolic BP A999333 0000000 0000000 0000000 [id number]  Diastolic BP 74 80 78 80 72 80 84  Wt. (Lbs) 158 - 159.12 160 154.12 150 154.8  BMI 28.89 - 29.1 29.26 28.18 27.43 28.31

## 2015-07-06 NOTE — Assessment & Plan Note (Signed)
Controlled, no change in medication Hyperlipidemia:Low fat diet discussed and encouraged.   Lipid Panel  Lab Results  Component Value Date   CHOL 153 06/28/2015   HDL 47 06/28/2015   LDLCALC 92 06/28/2015   TRIG 71 06/28/2015   CHOLHDL 3.3 06/28/2015

## 2015-08-21 ENCOUNTER — Ambulatory Visit (INDEPENDENT_AMBULATORY_CARE_PROVIDER_SITE_OTHER): Payer: PPO

## 2015-08-21 DIAGNOSIS — Z23 Encounter for immunization: Secondary | ICD-10-CM

## 2015-09-15 ENCOUNTER — Other Ambulatory Visit: Payer: Self-pay | Admitting: Family Medicine

## 2015-09-15 DIAGNOSIS — Z1231 Encounter for screening mammogram for malignant neoplasm of breast: Secondary | ICD-10-CM

## 2015-09-16 ENCOUNTER — Other Ambulatory Visit: Payer: Self-pay | Admitting: Family Medicine

## 2015-09-21 ENCOUNTER — Other Ambulatory Visit: Payer: Self-pay | Admitting: Family Medicine

## 2015-10-10 DIAGNOSIS — E785 Hyperlipidemia, unspecified: Secondary | ICD-10-CM | POA: Diagnosis not present

## 2015-10-14 ENCOUNTER — Other Ambulatory Visit: Payer: Self-pay | Admitting: Family Medicine

## 2015-10-17 ENCOUNTER — Encounter: Payer: Self-pay | Admitting: "Endocrinology

## 2015-10-17 ENCOUNTER — Ambulatory Visit (INDEPENDENT_AMBULATORY_CARE_PROVIDER_SITE_OTHER): Payer: PPO | Admitting: "Endocrinology

## 2015-10-17 VITALS — BP 152/83 | HR 84 | Ht 62.0 in | Wt 161.0 lb

## 2015-10-17 DIAGNOSIS — N2581 Secondary hyperparathyroidism of renal origin: Secondary | ICD-10-CM | POA: Diagnosis not present

## 2015-10-17 DIAGNOSIS — E785 Hyperlipidemia, unspecified: Secondary | ICD-10-CM

## 2015-10-17 NOTE — Progress Notes (Signed)
Subjective:    Patient ID: Kristen Cox, female    DOB: 1941-08-06, PCP Tula Nakayama, MD   Past Medical History  Diagnosis Date  . Hypertension   . Gout 2009  . Dry eyes 2010    on restasis - Dr. Jorja Loa   . Spinal stenosis   . Hyperlipidemia    Past Surgical History  Procedure Laterality Date  . Cesarean section    . Colonoscopy  04/09/2009    [id number] papilla otherwise, normal rectum/Left-sided diverticula/Cecal polyp status post cold snare and removal/The remainder of the colonic mucosa appeared normal. Adenomatous polyps, next TCS 03/2016 per RMR.   Social History   Social History  . Marital Status: Widowed    Spouse Name: N/A  . Number of Children: 2  . Years of Education: N/A   Occupational History  . retired     Social History Main Topics  . Smoking status: Former Smoker    Quit date: 10/07/1983  . Smokeless tobacco: None  . Alcohol Use: No  . Drug Use: No  . Sexual Activity: Not Currently   Other Topics Concern  . None   Social History Narrative   Outpatient Encounter Prescriptions as of 10/17/2015  Medication Sig  . allopurinol (ZYLOPRIM) 300 MG tablet Take 1 tablet (300 mg total) by mouth daily.  Marland Kitchen amLODipine (NORVASC) 10 MG tablet TAKE 1 TABLET BY MOUTH EVERY DAY  . aspirin (ASPIRIN LOW DOSE) 81 MG EC tablet Take 81 mg by mouth daily.    Marland Kitchen atenolol (TENORMIN) 50 MG tablet TAKE 1 TABLET(50 MG) BY MOUTH DAILY  . Calcium Carbonate-Vitamin D (CALCIUM 600+D) 600-400 MG-UNIT tablet Take 1 tablet by mouth daily.  . cycloSPORINE (RESTASIS) 0.05 % ophthalmic emulsion Place 1 drop into both eyes 2 (two) times daily.    . Flaxseed, Linseed, (FLAX SEED OIL) 1000 MG CAPS Take by mouth daily.    Marland Kitchen gabapentin (NEURONTIN) 300 MG capsule Take 1 capsule (300 mg total) by mouth at bedtime.  . Omega-3 Fatty Acids (FISH OIL) 1000 MG CAPS Take by mouth daily.    . simvastatin (ZOCOR) 10 MG tablet Take 1 tablet (10 mg total) by mouth daily.  Marland Kitchen spironolactone  (ALDACTONE) 50 MG tablet TAKE 1 TABLET(50 MG) BY MOUTH DAILY  . montelukast (SINGULAIR) 10 MG tablet Take 1 tablet (10 mg total) by mouth at bedtime. (Patient not taking: Reported on 07/03/2015)   No facility-administered encounter medications on file as of 10/17/2015.   ALLERGIES: Allergies  Allergen Reactions  . Benazepril Other (See Comments)    Patient does not recall being on this medication and/or what reaction she might have had.   VACCINATION STATUS: Immunization History  Administered Date(s) Administered  . Influenza,inj,Quad PF,36+ Mos 06/12/2014, 08/21/2015  . Pneumococcal Conjugate-13 11/13/2014  . Tdap 02/01/2011    HPI  75 year old female with medical hx as follows. She is being seen in f/u for secondary hyperparathyroidism.  Her current PTH is better at 47 and improved from 75.3 along with low normal cal of 10.3.  Her prior 24 hour urine calcium has been low at 44,  and was WNL at 118 last check. She denies family hx of calcium problem. She denies any hx of CVA, seizures, nephrolithiasis, nor CKD. she denies Osteoporosis. she is a former smoker. she denies any new complaints.  Review of Systems  Constitutional: has steady weight, no subjective hyperthermia/hypothermia Eyes: no blurry vision, no xerophthalmia ENT: no sore throat, no nodules palpated in  throat, no dysphagia/odynophagia, no hoarseness Cardiovascular: no CP/SOB/palpitations/leg swelling Respiratory: no cough/SOB Gastrointestinal: no N/V/D/C Musculoskeletal: no muscle/joint aches Skin: no rashes Neurological: no tremors/numbness/tingling/dizziness Psychiatric: no depression/anxiety   Objective:    BP 152/83 mmHg  Pulse 84  Ht 5\' 2"  (1.575 m)  Wt 161 lb (73.029 kg)  BMI 29.44 kg/m2  SpO2 97%  Wt Readings from Last 3 Encounters:  10/17/15 161 lb (73.029 kg)  07/03/15 158 lb (71.668 kg)  03/24/15 159 lb 1.9 oz (72.176 kg)    Physical Exam Constitutional: overweight, in NAD Eyes: PERRLA,  EOMI, no exophthalmos ENT: moist mucous membranes, no thyromegaly, no cervical lymphadenopathy Cardiovascular: RRR, No MRG Respiratory: CTA B Gastrointestinal: abdomen soft, NT, ND, BS+ Musculoskeletal: no deformities, strength intact in all 4 Skin: moist, warm, no rashes Neurological: no tremor with outstretched hands, DTR normal in all 4  Diabetic Labs (most recent): Lab Results  Component Value Date   HGBA1C 5.9* 03/19/2015   HGBA1C 5.7* 06/10/2014   HGBA1C 5.8* 03/27/2012    Lipid Panel     Component Value Date/Time   CHOL 153 06/28/2015 0930   TRIG 71 06/28/2015 0930   HDL 47 06/28/2015 0930   CHOLHDL 3.3 06/28/2015 0930   VLDL 14 06/28/2015 0930   LDLCALC 92 06/28/2015 0930    Repeat labs from 09/09/2015 showed intact PTH normal at 47 along with calcium 10.3 CMP are normal including kidney function Vitamin D normal at 38  Her Complete labs to be scanned into her records.   Assessment & Plan:   1. Hypercalcemia Her repeat work up is reviewed. Her repeat PTH is better at 47 , improved from 75.3, along with l seem of 10.3 improving from low calcium of 8.4 last visit. .  Her prior repeat 24 hour urine calcium was low at 44, and 118 on 2 separate occasions.   Differentials include early mild hyperparathyroidism, VS FHH. I discussed the potential complications of untreated hyperparathyroidism.  -She will not need intervention now, however she will need to stop calcium  supplement.  She will RTN in 6 months with repeat CMP, PTH. Continue Atenolol 50 mg po qday for HTN.  2. Hyperlipemia  I advised her to continue Simvastatin for high LDL improved to 97 from 167  - I advised patient to maintain close follow up with Tula Nakayama, MD for primary care needs. Follow up plan: Return in about 6 months (around 04/15/2016) for follow up with pre-visit labs.  Glade Lloyd, MD Phone: [phone]  Fax: [phone]   10/17/2015, 1:30 PM

## 2015-10-23 ENCOUNTER — Encounter: Payer: Self-pay | Admitting: "Endocrinology

## 2015-10-30 ENCOUNTER — Other Ambulatory Visit: Payer: Self-pay | Admitting: Family Medicine

## 2015-10-30 DIAGNOSIS — I10 Essential (primary) hypertension: Secondary | ICD-10-CM | POA: Diagnosis not present

## 2015-10-30 DIAGNOSIS — E790 Hyperuricemia without signs of inflammatory arthritis and tophaceous disease: Secondary | ICD-10-CM | POA: Diagnosis not present

## 2015-10-30 DIAGNOSIS — R799 Abnormal finding of blood chemistry, unspecified: Secondary | ICD-10-CM | POA: Diagnosis not present

## 2015-10-30 LAB — CBC
HEMATOCRIT: 37.9 % (ref 36.0–46.0)
Hemoglobin: 12.4 g/dL (ref 12.0–15.0)
MCH: 31.2 pg (ref 26.0–34.0)
MCHC: 32.7 g/dL (ref 30.0–36.0)
MCV: 95.5 fL (ref 78.0–100.0)
MPV: 10 fL (ref 8.6–12.4)
Platelets: 261 10*3/uL (ref 150–400)
RBC: 3.97 MIL/uL (ref 3.87–5.11)
RDW: 14.6 % (ref 11.5–15.5)
WBC: 5.5 10*3/uL (ref 4.0–10.5)

## 2015-10-31 LAB — BASIC METABOLIC PANEL
BUN: 22 mg/dL (ref 7–25)
CO2: 24 mmol/L (ref 20–31)
Calcium: 10.6 mg/dL — ABNORMAL HIGH (ref 8.6–10.4)
Chloride: 104 mmol/L (ref 98–110)
Creat: 0.83 mg/dL (ref 0.60–0.93)
Glucose, Bld: 92 mg/dL (ref 65–99)
POTASSIUM: 5.5 mmol/L — AB (ref 3.5–5.3)
SODIUM: 139 mmol/L (ref 135–146)

## 2015-10-31 LAB — TSH: TSH: 1.07 mIU/L

## 2015-10-31 LAB — URIC ACID: URIC ACID, SERUM: 3 mg/dL (ref 2.4–7.0)

## 2015-11-01 LAB — HEMOGLOBIN A1C
HEMOGLOBIN A1C: 6.1 % — AB (ref <5.7)
Mean Plasma Glucose: 128 mg/dL — ABNORMAL HIGH (ref <117)

## 2015-11-04 ENCOUNTER — Encounter: Payer: Self-pay | Admitting: Family Medicine

## 2015-11-04 ENCOUNTER — Ambulatory Visit (INDEPENDENT_AMBULATORY_CARE_PROVIDER_SITE_OTHER): Payer: PPO | Admitting: Family Medicine

## 2015-11-04 VITALS — BP 120/70 | HR 66 | Resp 16 | Ht 62.0 in | Wt 160.0 lb

## 2015-11-04 DIAGNOSIS — M109 Gout, unspecified: Secondary | ICD-10-CM

## 2015-11-04 DIAGNOSIS — I10 Essential (primary) hypertension: Secondary | ICD-10-CM | POA: Diagnosis not present

## 2015-11-04 DIAGNOSIS — E790 Hyperuricemia without signs of inflammatory arthritis and tophaceous disease: Secondary | ICD-10-CM

## 2015-11-04 DIAGNOSIS — E785 Hyperlipidemia, unspecified: Secondary | ICD-10-CM

## 2015-11-04 DIAGNOSIS — R7301 Impaired fasting glucose: Secondary | ICD-10-CM

## 2015-11-04 DIAGNOSIS — R42 Dizziness and giddiness: Secondary | ICD-10-CM | POA: Diagnosis not present

## 2015-11-04 MED ORDER — PREDNISONE 5 MG PO TABS: 5.0000 mg | ORAL_TABLET | Freq: Two times a day (BID) | ORAL | Status: AC

## 2015-11-04 MED ORDER — MECLIZINE HCL 25 MG PO TABS: 25.0000 mg | ORAL_TABLET | Freq: Three times a day (TID) | ORAL | Status: DC | PRN

## 2015-11-04 NOTE — Patient Instructions (Addendum)
Annual wellness end June, call if you need me before  Excellent labs and blood pressure.  No changes in medicatons  Antivert and prednisone sent in case  You have anacute attack of vertigo again or gout  Thanks for choosing [facility], we consider it a privelige to serve you.  All the best!

## 2015-11-04 NOTE — Assessment & Plan Note (Signed)
Controlled, no change in medication DASH diet and commitment to daily physical activity for a minimum of 30 minutes discussed and encouraged, as a part of hypertension management. The importance of attaining a healthy weight is also discussed.  BP/Weight 11/04/2015 10/17/2015 07/03/2015 05/28/2015 03/24/2015 11/13/2014 [id number]  Systolic BP 123456 0000000 [id number] 0000000 0000000 0000000 [id number]  Diastolic BP 70 83 74 80 78 80 72  Wt. (Lbs) 160 161 158 - 159.12 160 154.12  BMI 29.26 29.44 28.89 - 29.1 29.26 28.18

## 2015-11-04 NOTE — Assessment & Plan Note (Signed)
In left foot 2 weeks ago will send in prednisone for as needed use

## 2015-11-04 NOTE — Assessment & Plan Note (Signed)
Acute episode 1 week ago, had old med she used , will send in limited supply to have on hand if she needs it

## 2015-11-04 NOTE — Progress Notes (Signed)
Subjective:    Patient ID: Kristen Cox, female    DOB: Mar 30, 1941, 75 y.o.   MRN: [id number]  HPI   Kristen Cox     MRN: [id number]      DOB: Jul 23, 1941   HPI Kristen Cox is here for follow up and re-evaluation of chronic medical conditions, medication management and review of any available recent lab and radiology data.  Preventive health is updated, specifically  Cancer screening and Immunization.   Questions or concerns regarding consultations or procedures which the PT has had in the interim are  addressed. The PT denies any adverse reactions to current medications since the last visit.  Hd influenza, vertigo and gout since her last visit, all are now resolved, needs meds to have in event of recurrence, used out dated medication  ROS Denies recent fever or chills. Denies sinus pressure, nasal congestion, ear pain or sore throat. Denies chest congestion, productive cough or wheezing. Denies chest pains, palpitations and leg swelling Denies abdominal pain, nausea, vomiting,diarrhea or constipation.   Denies dysuria, frequency, hesitancy or incontinence. Denies joint pain, swelling and limitation in mobility. Denies headaches, seizures, numbness, or tingling. Denies depression, anxiety or insomnia. Denies skin break down or rash.   PE  BP 120/70 mmHg  Pulse 66  Resp 16  Ht 5\' 2"  (1.575 m)  Wt 160 lb (72.576 kg)  BMI 29.26 kg/m2  SpO2 98%  Patient alert and oriented and in no cardiopulmonary distress.  HEENT: No facial asymmetry, EOMI,   oropharynx pink and moist.  Neck supple no JVD, no mass.  Chest: Clear to auscultation bilaterally.  CVS: S1, S2 no murmurs, no S3.Regular rate.  ABD: Soft non tender.   Ext: No edema  MS: Adequate ROM spine, shoulders, hips and knees.  Skin: Intact, no ulcerations or rash noted.  Psych: Good eye contact, normal affect. Memory intact not anxious or depressed appearing.  CNS: CN 2-12 intact, power,  normal throughout.no  focal deficits noted.   Assessment & Plan   Essential hypertension Controlled, no change in medication DASH diet and commitment to daily physical activity for a minimum of 30 minutes discussed and encouraged, as a part of hypertension management. The importance of attaining a healthy weight is also discussed.  BP/Weight 11/04/2015 10/17/2015 07/03/2015 05/28/2015 03/24/2015 11/13/2014 [id number]  Systolic BP 123456 0000000 [id number] 0000000 0000000 0000000 [id number]  Diastolic BP 70 83 74 80 78 80 72  Wt. (Lbs) 160 161 158 - 159.12 160 154.12  BMI 29.26 29.44 28.89 - 29.1 29.26 28.18        GOUT In left foot 2 weeks ago will send in prednisone for as needed use  Vertigo Acute episode 1 week ago, had old med she used , will send in limited supply to have on hand if she needs it  IMPAIRED FASTING GLUCOSE Patient educated about the importance of limiting  Carbohydrate intake , the need to commit to daily physical activity for a minimum of 30 minutes , and to commit weight loss. The fact that changes in all these areas will reduce or eliminate all together the development of diabetes is stressed.  Deteriorated will work on food choice  Diabetic Labs Latest Ref Rng 10/30/2015 06/28/2015 03/19/2015 11/08/2014 06/10/2014  HbA1c <5.7 % 6.1(H) - 5.9(H) - 5.7(H)  Chol 125 - 200 mg/dL - 153 - 171 180  HDL >=46 mg/dL - 47 - 59 52  Calc LDL <130 mg/dL - 92 - 93 106(H)  Triglycerides <150 mg/dL -  71 - 93 108  Creatinine 0.60 - 0.93 mg/dL 0.83 0.86 0.87 0.90 0.78   BP/Weight 11/04/2015 10/17/2015 07/03/2015 05/28/2015 03/24/2015 11/13/2014 [id number]  Systolic BP 123456 0000000 [id number] 0000000 0000000 0000000 [id number]  Diastolic BP 70 83 74 80 78 80 72  Wt. (Lbs) 160 161 158 - 159.12 160 154.12  BMI 29.26 29.44 28.89 - 29.1 29.26 28.18   No flowsheet data found.     Hyperlipemia Hyperlipidemia:Low fat diet discussed and encouraged.   Lipid Panel  Lab Results  Component Value Date   CHOL 153 06/28/2015   HDL 47 06/28/2015   LDLCALC 92 06/28/2015     TRIG 71 06/28/2015   CHOLHDL 3.3 06/28/2015   Updated lab needed at/ before next visit.      Hyperuricemia Controlled, no change in medication        Review of Systems     Objective:   Physical Exam        Assessment & Plan:

## 2015-11-06 NOTE — Assessment & Plan Note (Signed)
Controlled, no change in medication  

## 2015-11-06 NOTE — Assessment & Plan Note (Signed)
Patient educated about the importance of limiting  Carbohydrate intake , the need to commit to daily physical activity for a minimum of 30 minutes , and to commit weight loss. The fact that changes in all these areas will reduce or eliminate all together the development of diabetes is stressed.  Deteriorated will work on food choice  Diabetic Labs Latest Ref Rng 10/30/2015 06/28/2015 03/19/2015 11/08/2014 06/10/2014  HbA1c <5.7 % 6.1(H) - 5.9(H) - 5.7(H)  Chol 125 - 200 mg/dL - 153 - 171 180  HDL >=46 mg/dL - 47 - 59 52  Calc LDL <130 mg/dL - 92 - 93 106(H)  Triglycerides <150 mg/dL - 71 - 93 108  Creatinine 0.60 - 0.93 mg/dL 0.83 0.86 0.87 0.90 0.78   BP/Weight 11/04/2015 10/17/2015 07/03/2015 05/28/2015 03/24/2015 11/13/2014 [id number]  Systolic BP 123456 0000000 [id number] 0000000 0000000 0000000 [id number]  Diastolic BP 70 83 74 80 78 80 72  Wt. (Lbs) 160 161 158 - 159.12 160 154.12  BMI 29.26 29.44 28.89 - 29.1 29.26 28.18   No flowsheet data found.

## 2015-11-06 NOTE — Assessment & Plan Note (Signed)
Hyperlipidemia:Low fat diet discussed and encouraged.   Lipid Panel  Lab Results  Component Value Date   CHOL 153 06/28/2015   HDL 47 06/28/2015   LDLCALC 92 06/28/2015   TRIG 71 06/28/2015   CHOLHDL 3.3 06/28/2015   Updated lab needed at/ before next visit.

## 2015-11-12 ENCOUNTER — Ambulatory Visit (HOSPITAL_COMMUNITY)
Admission: RE | Admit: 2015-11-12 | Discharge: 2015-11-12 | Disposition: A | Discharge status: Home / Self Care | Payer: PPO | Source: Ambulatory Visit | Attending: Family Medicine | Admitting: Family Medicine

## 2015-11-12 DIAGNOSIS — Z1231 Encounter for screening mammogram for malignant neoplasm of breast: Secondary | ICD-10-CM

## 2015-11-12 DIAGNOSIS — R928 Other abnormal and inconclusive findings on diagnostic imaging of breast: Secondary | ICD-10-CM | POA: Diagnosis not present

## 2015-11-14 ENCOUNTER — Other Ambulatory Visit: Payer: Self-pay | Admitting: Family Medicine

## 2015-11-14 DIAGNOSIS — R928 Other abnormal and inconclusive findings on diagnostic imaging of breast: Secondary | ICD-10-CM

## 2015-11-18 ENCOUNTER — Other Ambulatory Visit: Payer: Self-pay | Admitting: Family Medicine

## 2015-11-18 DIAGNOSIS — R928 Other abnormal and inconclusive findings on diagnostic imaging of breast: Secondary | ICD-10-CM

## 2015-11-25 ENCOUNTER — Ambulatory Visit (HOSPITAL_COMMUNITY)
Admission: RE | Admit: 2015-11-25 | Discharge: 2015-11-25 | Disposition: A | Discharge status: Home / Self Care | Payer: PPO | Source: Ambulatory Visit | Attending: Family Medicine | Admitting: Family Medicine

## 2015-11-25 DIAGNOSIS — R928 Other abnormal and inconclusive findings on diagnostic imaging of breast: Secondary | ICD-10-CM | POA: Insufficient documentation

## 2015-12-02 ENCOUNTER — Other Ambulatory Visit: Payer: Self-pay | Admitting: Family Medicine

## 2015-12-09 ENCOUNTER — Other Ambulatory Visit (HOSPITAL_COMMUNITY)
Admission: RE | Admit: 2015-12-09 | Discharge: 2015-12-09 | Disposition: A | Discharge status: Home / Self Care | Payer: PPO | Source: Ambulatory Visit | Attending: Obstetrics & Gynecology | Admitting: Obstetrics & Gynecology

## 2015-12-09 ENCOUNTER — Encounter: Payer: Self-pay | Admitting: Obstetrics & Gynecology

## 2015-12-09 ENCOUNTER — Ambulatory Visit (INDEPENDENT_AMBULATORY_CARE_PROVIDER_SITE_OTHER): Payer: PPO | Admitting: Obstetrics & Gynecology

## 2015-12-09 VITALS — BP 120/70 | HR 66 | Ht 62.0 in | Wt 160.0 lb

## 2015-12-09 DIAGNOSIS — Z124 Encounter for screening for malignant neoplasm of cervix: Secondary | ICD-10-CM | POA: Insufficient documentation

## 2015-12-09 DIAGNOSIS — Z01419 Encounter for gynecological examination (general) (routine) without abnormal findings: Secondary | ICD-10-CM

## 2015-12-09 NOTE — Progress Notes (Signed)
Patient ID: Kristen Cox, female   DOB: 18-Jul-1941, 75 y.o.   MRN: [id number] Subjective:     Kristen Cox is a 75 y.o. female here for a routine exam.  No LMP recorded. Patient is postmenopausal. No obstetric history on file. Birth Control Method:  Post menopausal Menstrual Calendar(currently):   Current complaints: .   Current acute medical issues:     Recent Gynecologic History No LMP recorded. Patient is postmenopausal. Last Pap: 2015,  normal Last mammogram: 2016 ,  normal  Past Medical History  Diagnosis Date  . Hypertension   . Gout 2009  . Dry eyes 2010    on restasis - Dr. Jorja Loa   . Spinal stenosis   . Hyperlipidemia     Past Surgical History  Procedure Laterality Date  . Cesarean section    . Colonoscopy  04/09/2009    [id number] papilla otherwise, normal rectum/Left-sided diverticula/Cecal polyp status post cold snare and removal/The remainder of the colonic mucosa appeared normal. Adenomatous polyps, next TCS 03/2016 per RMR.    OB History    No data available      Social History   Social History  . Marital Status: Widowed    Spouse Name: N/A  . Number of Children: 2  . Years of Education: N/A   Occupational History  . retired     Social History Main Topics  . Smoking status: Former Smoker    Quit date: 10/07/1983  . Smokeless tobacco: None  . Alcohol Use: No  . Drug Use: No  . Sexual Activity: Not Currently   Other Topics Concern  . None   Social History Narrative    Family History  Problem Relation Age of Onset  . Stroke Mother   . Stroke Father   . Pulmonary embolism Sister   . Diabetes Sister   . Colon cancer Other     niece, age 75  . Colon polyps Other     nephew     Current outpatient prescriptions:  .  allopurinol (ZYLOPRIM) 300 MG tablet, TAKE 1 TABLET(300 MG) BY MOUTH DAILY, Disp: 30 tablet, Rfl: 3 .  amLODipine (NORVASC) 10 MG tablet, TAKE 1 TABLET BY MOUTH EVERY DAY, Disp: 30 tablet, Rfl: 3 .  aspirin (ASPIRIN LOW  DOSE) 81 MG EC tablet, Take 81 mg by mouth daily.  , Disp: , Rfl:  .  atenolol (TENORMIN) 50 MG tablet, TAKE 1 TABLET(50 MG) BY MOUTH DAILY, Disp: 30 tablet, Rfl: 2 .  Calcium Carbonate-Vit D-Min (CALCIUM 1200) 1200-1000 MG-UNIT CHEW, Chew 1 each by mouth daily., Disp: , Rfl:  .  Flaxseed, Linseed, (FLAX SEED OIL) 1000 MG CAPS, Take by mouth daily.  , Disp: , Rfl:  .  gabapentin (NEURONTIN) 300 MG capsule, Take 1 capsule (300 mg total) by mouth at bedtime., Disp: 90 capsule, Rfl: 3 .  Omega-3 Fatty Acids (FISH OIL) 1000 MG CAPS, Take by mouth daily.  , Disp: , Rfl:  .  simvastatin (ZOCOR) 10 MG tablet, TAKE 1 TABLET(10 MG) BY MOUTH DAILY, Disp: 30 tablet, Rfl: 4 .  spironolactone (ALDACTONE) 50 MG tablet, TAKE 1 TABLET(50 MG) BY MOUTH DAILY, Disp: 30 tablet, Rfl: 3 .  cycloSPORINE (RESTASIS) 0.05 % ophthalmic emulsion, Place 1 drop into both eyes 2 (two) times daily.  , Disp: , Rfl:  .  meclizine (ANTIVERT) 25 MG tablet, Take 1 tablet (25 mg total) by mouth 3 (three) times daily as needed for dizziness. (Patient not taking: Reported on 12/09/2015), Disp:  30 tablet, Rfl: 0 .  montelukast (SINGULAIR) 10 MG tablet, Take 1 tablet (10 mg total) by mouth at bedtime. (Patient not taking: Reported on 07/03/2015), Disp: 30 tablet, Rfl: 3  Review of Systems  Review of Systems  Constitutional: Negative for fever, chills, weight loss, malaise/fatigue and diaphoresis.  HENT: Negative for hearing loss, ear pain, nosebleeds, congestion, sore throat, neck pain, tinnitus and ear discharge.   Eyes: Negative for blurred vision, double vision, photophobia, pain, discharge and redness.  Respiratory: Negative for cough, hemoptysis, sputum production, shortness of breath, wheezing and stridor.   Cardiovascular: Negative for chest pain, palpitations, orthopnea, claudication, leg swelling and PND.  Gastrointestinal: negative for abdominal pain. Negative for heartburn, nausea, vomiting, diarrhea, constipation, blood in  stool and melena.  Genitourinary: Negative for dysuria, urgency, frequency, hematuria and flank pain.  Musculoskeletal: Negative for myalgias, back pain, joint pain and falls.  Skin: Negative for itching and rash.  Neurological: Negative for dizziness, tingling, tremors, sensory change, speech change, focal weakness, seizures, loss of consciousness, weakness and headaches.  Endo/Heme/Allergies: Negative for environmental allergies and polydipsia. Does not bruise/bleed easily.  Psychiatric/Behavioral: Negative for depression, suicidal ideas, hallucinations, memory loss and substance abuse. The patient is not nervous/anxious and does not have insomnia.        Objective:  Blood pressure 120/70, pulse 66, height 5\' 2"  (1.575 m), weight 160 lb (72.576 kg).   Physical Exam  Vitals reviewed. Constitutional: She is oriented to person, place, and time. She appears well-developed and well-nourished.        Vulva is normal without lesions Vagina is pink moist without discharge Cervix normal in appearance and pap is done Uterus is normal size shape and contour Adnexa is negative with normal sized ovaries  {Rectal    hemoccult negative, normal tone, no masses  Musculoskeletal: Normal range of motion. She exhibits no edema and no tenderness.  Neurological: She is alert and oriented to person, place, and time. She has normal reflexes. She displays normal reflexes. No cranial nerve deficit. She exhibits normal muscle tone. Coordination normal.  Skin: Skin is warm and dry. No rash noted. No erythema. No pallor.  Psychiatric: She has a normal mood and affect. Her behavior is normal. Judgment and thought content normal.       Medications Ordered at today's visit: No orders of the defined types were placed in this encounter.    Other orders placed at today's visit: No orders of the defined types were placed in this encounter.      Assessment:    Healthy female exam.   assymptomatic left myoma    Plan:    Mammogram ordered. Follow up in: 3 years.

## 2015-12-10 LAB — CYTOLOGY - PAP

## 2015-12-13 ENCOUNTER — Other Ambulatory Visit: Payer: Self-pay | Admitting: Family Medicine

## 2015-12-28 ENCOUNTER — Telehealth: Payer: Self-pay | Admitting: Family Medicine

## 2015-12-28 DIAGNOSIS — M25561 Pain in right knee: Secondary | ICD-10-CM

## 2015-12-28 DIAGNOSIS — M25562 Pain in left knee: Principal | ICD-10-CM

## 2015-12-28 NOTE — Telephone Encounter (Signed)
C/o bilateral knee pain, pls refer to ortho of choice once he accepts her ins, I think, pls verify name with pt Dr Theda Sers in [place], her niece also sees the pt

## 2015-12-29 NOTE — Telephone Encounter (Signed)
Referral has been made to [facility] Dr. Hart Robinsons

## 2016-01-07 ENCOUNTER — Other Ambulatory Visit: Payer: Self-pay | Admitting: Family Medicine

## 2016-01-14 ENCOUNTER — Other Ambulatory Visit: Payer: Self-pay | Admitting: Family Medicine

## 2016-02-11 DIAGNOSIS — M170 Bilateral primary osteoarthritis of knee: Secondary | ICD-10-CM | POA: Diagnosis not present

## 2016-02-11 DIAGNOSIS — M1711 Unilateral primary osteoarthritis, right knee: Secondary | ICD-10-CM | POA: Diagnosis not present

## 2016-02-11 DIAGNOSIS — E785 Hyperlipidemia, unspecified: Secondary | ICD-10-CM | POA: Diagnosis not present

## 2016-02-11 DIAGNOSIS — M1712 Unilateral primary osteoarthritis, left knee: Secondary | ICD-10-CM | POA: Diagnosis not present

## 2016-02-11 DIAGNOSIS — I10 Essential (primary) hypertension: Secondary | ICD-10-CM | POA: Diagnosis not present

## 2016-02-12 LAB — COMPREHENSIVE METABOLIC PANEL
ALT: 13 U/L (ref 6–29)
AST: 22 U/L (ref 10–35)
Albumin: 3.8 g/dL (ref 3.6–5.1)
Alkaline Phosphatase: 52 U/L (ref 33–130)
BUN: 17 mg/dL (ref 7–25)
CALCIUM: 9.7 mg/dL (ref 8.6–10.4)
CO2: 25 mmol/L (ref 20–31)
Chloride: 108 mmol/L (ref 98–110)
Creat: 0.76 mg/dL (ref 0.60–0.93)
GLUCOSE: 103 mg/dL — AB (ref 65–99)
POTASSIUM: 3.7 mmol/L (ref 3.5–5.3)
Sodium: 143 mmol/L (ref 135–146)
Total Bilirubin: 0.6 mg/dL (ref 0.2–1.2)
Total Protein: 6.5 g/dL (ref 6.1–8.1)

## 2016-02-12 LAB — LIPID PANEL
CHOL/HDL RATIO: 3.2 ratio (ref <=5.0)
CHOLESTEROL: 148 mg/dL (ref 125–200)
HDL: 46 mg/dL (ref >=46)
LDL CALC: 83 mg/dL (ref <130)
TRIGLYCERIDES: 97 mg/dL (ref <150)
VLDL: 19 mg/dL (ref <30)

## 2016-02-17 ENCOUNTER — Encounter (INDEPENDENT_AMBULATORY_CARE_PROVIDER_SITE_OTHER): Payer: Self-pay

## 2016-02-17 ENCOUNTER — Telehealth: Payer: Self-pay

## 2016-02-17 ENCOUNTER — Encounter: Payer: Self-pay | Admitting: Family Medicine

## 2016-02-17 ENCOUNTER — Ambulatory Visit (INDEPENDENT_AMBULATORY_CARE_PROVIDER_SITE_OTHER): Payer: PPO | Admitting: Family Medicine

## 2016-02-17 VITALS — BP 138/72 | HR 73 | Resp 16 | Ht 62.0 in | Wt 158.1 lb

## 2016-02-17 DIAGNOSIS — I10 Essential (primary) hypertension: Secondary | ICD-10-CM

## 2016-02-17 DIAGNOSIS — Z0000 Encounter for general adult medical examination without abnormal findings: Secondary | ICD-10-CM

## 2016-02-17 DIAGNOSIS — G4762 Sleep related leg cramps: Secondary | ICD-10-CM | POA: Diagnosis not present

## 2016-02-17 DIAGNOSIS — H547 Unspecified visual loss: Secondary | ICD-10-CM

## 2016-02-17 DIAGNOSIS — Z23 Encounter for immunization: Secondary | ICD-10-CM

## 2016-02-17 DIAGNOSIS — M109 Gout, unspecified: Secondary | ICD-10-CM

## 2016-02-17 DIAGNOSIS — J3089 Other allergic rhinitis: Secondary | ICD-10-CM | POA: Diagnosis not present

## 2016-02-17 DIAGNOSIS — R7301 Impaired fasting glucose: Secondary | ICD-10-CM

## 2016-02-17 MED ORDER — MONTELUKAST SODIUM 10 MG PO TABS: 10.0000 mg | ORAL_TABLET | Freq: Every day | ORAL | Status: DC

## 2016-02-17 MED ORDER — ALLOPURINOL 300 MG PO TABS: ORAL_TABLET | ORAL | Status: DC

## 2016-02-17 MED ORDER — CYCLOBENZAPRINE HCL 5 MG PO TABS: 5.0000 mg | ORAL_TABLET | Freq: Every day | ORAL | Status: DC

## 2016-02-17 MED ORDER — SIMVASTATIN 10 MG PO TABS
ORAL_TABLET | ORAL | Status: DC
Start: 2016-02-17 — End: 2016-05-06

## 2016-02-17 MED ORDER — TIZANIDINE HCL 2 MG PO TABS: 2.0000 mg | ORAL_TABLET | Freq: Every day | ORAL | Status: DC

## 2016-02-17 MED ORDER — SPIRONOLACTONE 50 MG PO TABS: ORAL_TABLET | ORAL | Status: DC

## 2016-02-17 MED ORDER — ATENOLOL 50 MG PO TABS: ORAL_TABLET | ORAL | Status: DC

## 2016-02-17 NOTE — Telephone Encounter (Signed)
Changed and printed pls send and let her know

## 2016-02-17 NOTE — Progress Notes (Signed)
Subjective:    Patient ID: Kristen Cox, female    DOB: [birth year], 75 y.o.   MRN: [id number]  HPI Preventive Screening-Counseling & Management   Patient present here today for a Medicare annual wellness visit. 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 Currently lives with her youngest son    Risk Factors  Current exercise habits:  Every morning for 45 minutes on the treadmill   Dietary issues discussed: heart healthy diet, encouraged more fruits and vegetables and tries to limit her fried fatty foods and red meat    Cardiac risk factors: none significant   Depression Screen  (Note: if answer to either of the following is "Yes", a more complete depression screening is indicated)   Over the past two weeks, have you felt down, depressed or hopeless? No  Over the past two weeks, have you felt little interest or pleasure in doing things? No  Have you lost interest or pleasure in daily life? No  Do you often feel hopeless? No  Do you cry easily over simple problems? No   Activities of Daily Living  In your present state of health, do you have any difficulty performing the following activities?  Driving?: No Managing money?: No Feeding yourself?:No Getting from bed to chair?:No Climbing a flight of stairs?:at times Preparing food and eating?:No Bathing or showering?:No Getting dressed?:No Getting to the toilet?:No Using the toilet?:No Moving around from place to place?: No  Fall Risk Assessment In the past year have you fallen or had a near fall?:No Are you currently taking any medications that make you dizzy?:No   Hearing  Difficulties: No Do you often ask people to speak up or repeat themselves?:No Do you experience ringing or noises in your ears?:No Do you have difficulty understanding soft or whispered voices?:No  Cognitive Testing  Alert? Yes Normal Appearance?Yes  Oriented to person? Yes Place? Yes  Time? Yes  Displays appropriate judgment?Yes  Can read the correct time from a watch face? yes Are you having problems remembering things?No  Advanced Directives have been discussed with the patient?Yes, full code     List the Names of Other Physician/Practitioners you currently use: (updated)    Indicate any recent Medical Services you may have received from other than [facility] providers in the past year (date may be approximate).   Assessment:    Annual Wellness Exam   Plan:    During the course of the visit the patient was educated and counseled about appropriate screening and preventive services including:  A healthy diet is rich in fruit, vegetables and whole grains. Poultry fish, nuts and beans are a healthy choice for protein rather then red meat. A low sodium diet and drinking 64 ounces of water daily is generally recommended. Oils and sweet should be limited. Carbohydrates especially for those who are diabetic or overweight, should be limited to 30-45 gram per meal. It is important to eat on a regular schedule, at least 3 times daily. Snacks should be primarily fruits, vegetables or nuts. It is important that you exercise regularly at least 30 minutes 5 times a week. If you develop chest pain,  have severe difficulty breathing, or feel very tired, stop exercising immediately and seek medical attention  Immunization reviewed and updated. Cancer screening reviewed and updated    Patient Instructions (the written plan) was given to the patient.  Medicare Attestation     Review of Systems     Objective:   Physical Exam BP 120/72 mmHg  Pulse 73  Resp 16  Ht 5\' 2"  (1.575 m)  Wt 158 lb 1.9  oz (71.723 kg)  BMI 28.91 kg/m2  SpO2 96%  HEENT: no sinus tenderness, oropharynx no erythema or exudate, TM clear  Chest Clear to ascultation      Assessment & Plan:  Medicare annual wellness visit, subsequent Annual exam as documented. Counseling done  re healthy lifestyle involving commitment to 150 minutes exercise per week, heart healthy diet, and attaining healthy weight.The importance of adequate sleep also discussed. Regular seat belt use and home safety, is also discussed. Changes in health habits are decided on by the patient with goals and time frames  set for achieving them. Immunization and cancer screening needs are specifically addressed at this visit.   Sleep related leg cramps Leg spasms disturbing sleep, normal electrolyte pane, trial of muscle relaxant at bedtime  Allergic rhinitis Uncontrolled with post nasal drip and chronic drry cough and tickle x weeks, resume singulair

## 2016-02-17 NOTE — Telephone Encounter (Signed)
Flexeril not covered. Ok to change to tizanidine?

## 2016-02-17 NOTE — Telephone Encounter (Signed)
Patient aware.

## 2016-02-17 NOTE — Patient Instructions (Addendum)
Annual physical exam in 4.5 months, call if you need me before  Keep up the good work  Pneumonia 23 today  Two new medications, singulair for tickle in throat and cough For cramps and spasm at bedtime cyclobenzaprine  HBA1C, chem 7 and EGFR , uric acid in 4.5 month, non fast  Thank you  for choosing [facility]. We consider it a privelige to serve you.  Delivering excellent health care in a caring and  compassionate way is our goal.  Partnering with you,  so that together we can achieve this goal is our strategy.  Please work on good  health habits so that your health will improve. 1. Commitment to daily physical activity for 30 to 60  minutes, if you are able to do this.  2. Commitment to wise food choices. Aim for half of your  food intake to be vegetable and fruit, one quarter starchy foods, and one quarter protein. Try to eat on a regular schedule  3 meals per day, snacking between meals should be limited to vegetables or fruits or small portions of nuts. 64 ounces of water per day is generally recommended, unless you have specific health conditions, like heart failure or kidney failure where you will need to limit fluid intake.  3. Commitment to sufficient and a  good quality of physical and mental rest daily, generally between 6 to 8 hours per day.  WITH PERSISTANCE AND PERSEVERANCE, THE IMPOSSIBLE , BECOMES THE NORM!     Fall Prevention in the Home  Falls can cause injuries. They can happen to people of all ages. There are many things you can do to make your home safe and to help prevent falls.  WHAT CAN I DO ON THE OUTSIDE OF MY HOME?  Regularly fix the edges of walkways and driveways and fix any cracks.  Remove anything that might make you trip as you walk through a door, such as a raised step or threshold.  Trim any bushes or trees on the path to your home.  Use bright outdoor lighting.  Clear any walking paths of anything that might make someone trip,  such as rocks or tools.  Regularly check to see if handrails are loose or broken. Make sure that both sides of any steps have handrails.  Any raised decks and porches should have guardrails on the edges.  Have any leaves, snow, or ice cleared regularly.  Use sand or salt on walking paths during winter.  Clean up any spills in your garage right away. This includes oil or grease spills. WHAT CAN I DO IN THE BATHROOM?   Use night lights.  Install grab bars by the toilet and in the tub and shower. Do not use towel bars as grab bars.  Use non-skid mats or decals in the tub or shower.  If you need to sit down in the shower, use a plastic, non-slip stool.  Keep the floor dry. Clean up any water that spills on the floor as soon as it happens.  Remove soap buildup in the tub or shower regularly.  Attach bath mats securely with double-sided non-slip rug tape.  Do not have throw rugs and other things on the floor that can make you trip. WHAT CAN I DO IN THE BEDROOM?  Use night lights.  Make sure that you have a light by your bed that is easy to reach.  Do not use any sheets or blankets that are too big for your bed. They should  not hang down onto the floor.  Have a firm chair that has side arms. You can use this for support while you get dressed.  Do not have throw rugs and other things on the floor that can make you trip. WHAT CAN I DO IN THE KITCHEN?  Clean up any spills right away.  Avoid walking on wet floors.  Keep items that you use a lot in easy-to-reach places.  If you need to reach something above you, use a strong step stool that has a grab bar.  Keep electrical cords out of the way.  Do not use floor polish or wax that makes floors slippery. If you must use wax, use non-skid floor wax.  Do not have throw rugs and other things on the floor that can make you trip. WHAT CAN I DO WITH MY STAIRS?  Do not leave any items on the stairs.  Make sure that there are  handrails on both sides of the stairs and use them. Fix handrails that are broken or loose. Make sure that handrails are as long as the stairways.  Check any carpeting to make sure that it is firmly attached to the stairs. Fix any carpet that is loose or worn.  Avoid having throw rugs at the top or bottom of the stairs. If you do have throw rugs, attach them to the floor with carpet tape.  Make sure that you have a light switch at the top of the stairs and the bottom of the stairs. If you do not have them, ask someone to add them for you. WHAT ELSE CAN I DO TO HELP PREVENT FALLS?  Wear shoes that:  Do not have high heels.  Have rubber bottoms.  Are comfortable and fit you well.  Are closed at the toe. Do not wear sandals.  If you use a stepladder:  Make sure that it is fully opened. Do not climb a closed stepladder.  Make sure that both sides of the stepladder are locked into place.  Ask someone to hold it for you, if possible.  Clearly mark and make sure that you can see:  Any grab bars or handrails.  First and last steps.  Where the edge of each step is.  Use tools that help you move around (mobility aids) if they are needed. These include:  Canes.  Walkers.  Scooters.  Crutches.  Turn on the lights when you go into a dark area. Replace any light bulbs as soon as they burn out.  Set up your furniture so you have a clear path. Avoid moving your furniture around.  If any of your floors are uneven, fix them.  If there are any pets around you, be aware of where they are.  Review your medicines with your doctor. Some medicines can make you feel dizzy. This can increase your chance of falling. Ask your doctor what other things that you can do to help prevent falls.   This information is not intended to replace advice given to you by your health care provider. Make sure you discuss any questions you have with your health care provider.   Document Released:  06/05/2009 Document Revised: 12/24/2014 Document Reviewed: 09/13/2014 Elsevier Interactive Patient Education [employer].

## 2016-02-17 NOTE — Assessment & Plan Note (Addendum)
Leg spasms disturbing sleep, normal electrolyte pane, trial of muscle relaxant at bedtime

## 2016-02-17 NOTE — Assessment & Plan Note (Signed)

## 2016-02-18 NOTE — Assessment & Plan Note (Signed)
Uncontrolled with post nasal drip and chronic drry cough and tickle x weeks, resume singulair

## 2016-02-20 ENCOUNTER — Telehealth: Payer: Self-pay | Admitting: Family Medicine

## 2016-02-20 NOTE — Telephone Encounter (Signed)
The pills that Dr. Moshe Cipro put her on for Cramps is not working, please advise?

## 2016-02-20 NOTE — Telephone Encounter (Signed)
I recommend increasing bedtime gabapentin to two 300 mg capsules and see if this is beneficial , call and let us knwo if works so script can be changed

## 2016-02-20 NOTE — Telephone Encounter (Signed)
Aware to increase med and call back if it helps

## 2016-02-27 ENCOUNTER — Telehealth: Payer: Self-pay | Admitting: Family Medicine

## 2016-02-27 NOTE — Telephone Encounter (Signed)
Called and left message for patient to return call.  Robaxin is not on her med list.  Will try contacting pharmacy for last muscle relaxer filled.

## 2016-02-27 NOTE — Telephone Encounter (Signed)
Kristen Cox is asking for a refill on Methocarbamol 500mg  for muscle spasms sent to [facility] in [place]

## 2016-02-27 NOTE — Telephone Encounter (Signed)
Called and spoke with pharmacy.   Patient filled Tizanidine on 6/27.  Too early for another muscle relaxer.  Will clarify with patient when she receives previous message.

## 2016-03-01 ENCOUNTER — Telehealth: Payer: Self-pay | Admitting: Internal Medicine

## 2016-03-01 ENCOUNTER — Encounter: Payer: Self-pay | Admitting: *Deleted

## 2016-03-01 MED ORDER — METHOCARBAMOL 500 MG PO TABS: 500.0000 mg | ORAL_TABLET | Freq: Every evening | ORAL | Status: DC | PRN

## 2016-03-01 NOTE — Telephone Encounter (Signed)
RECALL FOR TCS °

## 2016-03-01 NOTE — Telephone Encounter (Signed)
Change in medication sent to pharmacy.  Patient aware to check with pharmacy later in the day.

## 2016-03-01 NOTE — Telephone Encounter (Signed)
Letter Mailed 03-01-16 Direce RMA

## 2016-03-01 NOTE — Telephone Encounter (Signed)
Yes, pls send in new scrip remove other as ineffective ( done)

## 2016-03-01 NOTE — Telephone Encounter (Signed)
Patient is asking if Tizanidine can be changed to Methocarbamol.  States that Tizanidine is ineffective.   Please advise.

## 2016-03-01 NOTE — Addendum Note (Signed)
Addended by: Denman George B on: 03/01/2016 11:20 AM   Modules accepted: Orders

## 2016-03-15 DIAGNOSIS — M170 Bilateral primary osteoarthritis of knee: Secondary | ICD-10-CM | POA: Diagnosis not present

## 2016-03-17 ENCOUNTER — Telehealth: Payer: Self-pay

## 2016-03-17 NOTE — Telephone Encounter (Signed)
Patient called to schedule her tcs   [phone]  st

## 2016-03-17 NOTE — Telephone Encounter (Signed)
LMOM for a return call.  

## 2016-03-18 NOTE — Telephone Encounter (Signed)
Pt has OV with Walden Field, NP on 04/16/2016 at 10:30 AM. She was on Recall, last colonoscopy was 04/09/2009 with Dr. Gala Romney and she has hx of adenomatous polyps. He recommended next colonoscopy in 7 years.

## 2016-04-06 DIAGNOSIS — M170 Bilateral primary osteoarthritis of knee: Secondary | ICD-10-CM | POA: Diagnosis not present

## 2016-04-13 ENCOUNTER — Other Ambulatory Visit: Payer: Self-pay | Admitting: "Endocrinology

## 2016-04-14 DIAGNOSIS — H31012 Macula scars of posterior pole (postinflammatory) (post-traumatic), left eye: Secondary | ICD-10-CM | POA: Diagnosis not present

## 2016-04-14 DIAGNOSIS — H2513 Age-related nuclear cataract, bilateral: Secondary | ICD-10-CM | POA: Diagnosis not present

## 2016-04-14 DIAGNOSIS — M170 Bilateral primary osteoarthritis of knee: Secondary | ICD-10-CM | POA: Diagnosis not present

## 2016-04-14 LAB — PTH, INTACT AND CALCIUM
CALCIUM: 9.9 mg/dL (ref 8.4–10.5)
PTH: 44 pg/mL (ref 14–64)

## 2016-04-14 LAB — VITAMIN D 25 HYDROXY (VIT D DEFICIENCY, FRACTURES): VIT D 25 HYDROXY: 38 ng/mL (ref 30–100)

## 2016-04-16 ENCOUNTER — Ambulatory Visit: Payer: PPO | Admitting: Nurse Practitioner

## 2016-04-19 ENCOUNTER — Encounter: Payer: Self-pay | Admitting: "Endocrinology

## 2016-04-19 ENCOUNTER — Ambulatory Visit (INDEPENDENT_AMBULATORY_CARE_PROVIDER_SITE_OTHER): Payer: PPO | Admitting: "Endocrinology

## 2016-04-19 DIAGNOSIS — E785 Hyperlipidemia, unspecified: Secondary | ICD-10-CM

## 2016-04-19 NOTE — Progress Notes (Signed)
Subjective:    Patient ID: Kristen Cox, female    DOB: 1940/08/24, PCP Tula Nakayama, MD   Past Medical History:  Diagnosis Date  . Dry eyes 2010   on restasis - Dr. Jorja Loa   . Gout 2009  . Hyperlipidemia   . Hypertension   . Spinal stenosis    Past Surgical History:  Procedure Laterality Date  . CESAREAN SECTION    . COLONOSCOPY  04/09/2009   [id number] papilla otherwise, normal rectum/Left-sided diverticula/Cecal polyp status post cold snare and removal/The remainder of the colonic mucosa appeared normal. Adenomatous polyps, next TCS 03/2016 per RMR.   Social History   Social History  . Marital status: Widowed    Spouse name: N/A  . Number of children: 2  . Years of education: N/A   Occupational History  . retired     Social History Main Topics  . Smoking status: Former Smoker    Quit date: 10/07/1983  . Smokeless tobacco: Never Used  . Alcohol use No  . Drug use: No  . Sexual activity: Not Currently   Other Topics Concern  . None   Social History Narrative  . None   Outpatient Encounter Prescriptions as of 04/19/2016  Medication Sig  . allopurinol (ZYLOPRIM) 300 MG tablet TAKE 1 TABLET(300 MG) BY MOUTH DAILY  . amLODipine (NORVASC) 10 MG tablet TAKE 1 TABLET BY MOUTH EVERY DAY  . aspirin (ASPIRIN LOW DOSE) 81 MG EC tablet Take 81 mg by mouth daily.    . Calcium Carbonate-Vit D-Min (CALCIUM 1200) 1200-1000 MG-UNIT CHEW Chew 1 each by mouth daily.  . cycloSPORINE (RESTASIS) 0.05 % ophthalmic emulsion Place 1 drop into both eyes 2 (two) times daily.    . Flaxseed, Linseed, (FLAX SEED OIL) 1000 MG CAPS Take by mouth daily.    Marland Kitchen gabapentin (NEURONTIN) 300 MG capsule Take 1 capsule (300 mg total) by mouth at bedtime.  . Omega-3 Fatty Acids (FISH OIL) 1000 MG CAPS Take by mouth daily.    Marland Kitchen spironolactone (ALDACTONE) 50 MG tablet TAKE 1 TABLET(50 MG) BY MOUTH DAILY  . atenolol (TENORMIN) 50 MG tablet TAKE 1 TABLET(50 MG) BY MOUTH DAILY (Patient not taking:  Reported on 04/19/2016)  . ibuprofen (ADVIL,MOTRIN) 800 MG tablet TAKE 1 TABLET BY MOUTH WEEKLY IF NEEDED FOR UNCONTROLLED BACK PAIN  . methocarbamol (ROBAXIN) 500 MG tablet Take 1 tablet (500 mg total) by mouth at bedtime as needed for muscle spasms. (Patient not taking: Reported on 04/19/2016)  . simvastatin (ZOCOR) 10 MG tablet TAKE 1 TABLET(10 MG) BY MOUTH DAILY (Patient not taking: Reported on 04/19/2016)  . [DISCONTINUED] montelukast (SINGULAIR) 10 MG tablet Take 1 tablet (10 mg total) by mouth at bedtime.   No facility-administered encounter medications on file as of 04/19/2016.    ALLERGIES: Allergies  Allergen Reactions  . Benazepril Other (See Comments)    Patient does not recall being on this medication and/or what reaction she might have had.   VACCINATION STATUS: Immunization History  Administered Date(s) Administered  . Influenza,inj,Quad PF,36+ Mos 06/12/2014, 08/21/2015  . Pneumococcal Conjugate-13 11/13/2014  . Pneumococcal Polysaccharide-23 02/17/2016  . Tdap 02/01/2011    HPI  75 year old female with medical hx as follows. She is being seen in f/u for secondary hyperparathyroidism.  Her current PTH is better at 44 and improved from 75.3 along with low normal cal of 9.9.  Her prior 24 hour urine calcium has been low at 44,  and was WNL at 118  last check. She denies family hx of calcium problem. She denies any hx of CVA, seizures, nephrolithiasis, nor CKD. she denies Osteoporosis. she is a former smoker. she denies any new complaints.  Review of Systems  Constitutional: has steady weight, no subjective hyperthermia/hypothermia Eyes: no blurry vision, no xerophthalmia ENT: no sore throat, no nodules palpated in throat, no dysphagia/odynophagia, no hoarseness Cardiovascular: no CP/SOB/palpitations/leg swelling Respiratory: no cough/SOB Gastrointestinal: no N/V/D/C Musculoskeletal: no muscle/joint aches Skin: no rashes Neurological: no  tremors/numbness/tingling/dizziness Psychiatric: no depression/anxiety   Objective:    BP 135/80   Pulse 100   Ht 5\' 2"  (1.575 m)   Wt 157 lb (71.2 kg)   BMI 28.72 kg/m   Wt Readings from Last 3 Encounters:  04/19/16 157 lb (71.2 kg)  02/17/16 158 lb 1.9 oz (71.7 kg)  12/09/15 160 lb (72.6 kg)    Physical Exam Constitutional: overweight, in NAD Eyes: PERRLA, EOMI, no exophthalmos ENT: moist mucous membranes, no thyromegaly, no cervical lymphadenopathy Cardiovascular: RRR, No MRG Respiratory: CTA B Gastrointestinal: abdomen soft, NT, ND, BS+ Musculoskeletal: no deformities, strength intact in all 4 Skin: moist, warm, no rashes Neurological: no tremor with outstretched hands, DTR normal in all 4  Diabetic Labs (most recent): Lab Results  Component Value Date   HGBA1C 6.1 (H) 10/30/2015   HGBA1C 5.9 (H) 03/19/2015   HGBA1C 5.7 (H) 06/10/2014    Lipid Panel     Component Value Date/Time   CHOL 148 02/11/2016 0718   TRIG 97 02/11/2016 0718   HDL 46 02/11/2016 0718   CHOLHDL 3.2 02/11/2016 0718   VLDL 19 02/11/2016 0718   LDLCALC 83 02/11/2016 0718   Recent Results (from the past 2160 hour(s))  Lipid panel     Status: None   Collection Time: 02/11/16  7:18 AM  Result Value Ref Range   Cholesterol 148 125 - 200 mg/dL   Triglycerides 97 <150 mg/dL   HDL 46 >=46 mg/dL   Total CHOL/HDL Ratio 3.2 <=5.0 Ratio   VLDL 19 <30 mg/dL   LDL Cholesterol 83 <130 mg/dL    Comment:   Total Cholesterol/HDL Ratio:CHD Risk                        Coronary Heart Disease Risk Table                                        Men       Women          1/2 Average Risk              3.4        3.3              Average Risk              5.0        4.4           2X Average Risk              9.6        7.1           3X Average Risk             23.4       11.0 Use the calculated Patient Ratio above and the CHD Risk table  to determine the patient's CHD Risk.   Comprehensive  metabolic panel      Status: Abnormal   Collection Time: 02/11/16  7:18 AM  Result Value Ref Range   Sodium 143 135 - 146 mmol/L   Potassium 3.7 3.5 - 5.3 mmol/L   Chloride 108 98 - 110 mmol/L   CO2 25 20 - 31 mmol/L   Glucose, Bld 103 (H) 65 - 99 mg/dL   BUN 17 7 - 25 mg/dL   Creat 0.76 0.60 - 0.93 mg/dL   Total Bilirubin 0.6 0.2 - 1.2 mg/dL   Alkaline Phosphatase 52 33 - 130 U/L   AST 22 10 - 35 U/L   ALT 13 6 - 29 U/L   Total Protein 6.5 6.1 - 8.1 g/dL   Albumin 3.8 3.6 - 5.1 g/dL   Calcium 9.7 8.6 - 10.4 mg/dL  PTH, intact and calcium     Status: None   Collection Time: 04/13/16  7:25 AM  Result Value Ref Range   PTH 44 14 - 64 pg/mL   Calcium 9.9 8.4 - 10.5 mg/dL    Comment:   Interpretive Guide:                              Intact PTH               Calcium                              ----------               ------- Normal Parathyroid           Normal                   Normal Hypoparathyroidism           Low or Low Normal        Low Hyperparathyroidism      Primary                 Normal or High           High      Secondary               High                     Normal or Low      Tertiary                High                     High Non-Parathyroid   Hypercalcemia              Low or Low Normal        High   VITAMIN D 25 Hydroxy (Vit-D Deficiency, Fractures)     Status: None   Collection Time: 04/13/16  7:25 AM  Result Value Ref Range   Vit D, 25-Hydroxy 38 30 - 100 ng/mL    Comment: Vitamin D Status           25-OH Vitamin D        Deficiency                <20 ng/mL        Insufficiency         20 - 29 ng/mL        Optimal             >  or = 30 ng/mL   For 25-OH Vitamin D testing on patients on D2-supplementation and patients for whom quantitation of D2 and D3 fractions is required, the QuestAssureD 25-OH VIT D, (D2,D3), LC/MS/MS is recommended: order code [phone] (patients > 2 yrs).     Assessment & Plan:   1. Hypercalcemia Her repeat work up is reviewed. Her repeat PTH  is better at 44 , generally improved from 75.3, along with calcium of 9.9, stable.  Her prior repeat 24 hour urine calcium was low at 44, and 118 on 2 separate occasions.   Differentials include early mild hyperparathyroidism, VS FHH. I discussed the potential complications of untreated hyperparathyroidism.  -She will not need intervention now, she may continue cal D 600/800 po daily.  Continue Atenolol 50 mg po qday for HTN.  2. Hyperlipemia Due to cramps , I advised her to hold simvastatin for now. Continue  omega-3 fatty acids  - I advised patient to maintain close follow up with Tula Nakayama, MD for primary care needs. Follow up plan: Return in about 1 year (around 04/19/2017) for follow up with pre-visit labs.  Glade Lloyd, MD Phone: [phone]  Fax: [phone]   04/19/2016, 10:03 AM

## 2016-04-21 DIAGNOSIS — M170 Bilateral primary osteoarthritis of knee: Secondary | ICD-10-CM | POA: Diagnosis not present

## 2016-05-05 ENCOUNTER — Other Ambulatory Visit: Payer: Self-pay | Admitting: Family Medicine

## 2016-05-05 DIAGNOSIS — M5416 Radiculopathy, lumbar region: Secondary | ICD-10-CM

## 2016-05-06 ENCOUNTER — Encounter: Payer: Self-pay | Admitting: Nurse Practitioner

## 2016-05-06 ENCOUNTER — Ambulatory Visit (INDEPENDENT_AMBULATORY_CARE_PROVIDER_SITE_OTHER): Payer: PPO | Admitting: Nurse Practitioner

## 2016-05-06 ENCOUNTER — Other Ambulatory Visit: Payer: Self-pay

## 2016-05-06 DIAGNOSIS — Z86010 Personal history of colonic polyps: Secondary | ICD-10-CM

## 2016-05-06 MED ORDER — NA SULFATE-K SULFATE-MG SULF 17.5-3.13-1.6 GM/177ML PO SOLN: 1.0000 | ORAL | 0 refills | Status: DC

## 2016-05-06 NOTE — Progress Notes (Signed)
Referring Provider: Fayrene Helper, MD Primary Care Physician:  Tula Nakayama, MD Primary GI:  Dr. Gala Romney  Chief Complaint  Patient presents with  . Colonoscopy    hx adenomatous polyps    HPI:   Kristen Cox is a 75 y.o. female who presents on recall for repeat colonoscopy. She has a history of colon polyps. Last colonoscopy completed 04/09/2009 which found anal papilla, otherwise normal rectum, left-sided diverticula, cecal polyp, remainder of colonic mucosa normal. Surgical pathology found the polyp to be adenomatous. Recommended repeat colonoscopy in 7 years.  Today she states she's doing great. Denies abdominal pain, N/V, hematochezia, melena, fever, chills, unintentional weight loss, changes in bowel habits. Denies chest pain, dyspnea, dizziness, lightheadedness, syncope, near syncope. Denies any other upper or lower GI symptoms. Denies sleep apnea.  Past Medical History:  Diagnosis Date  . Dry eyes 2010   on restasis - Dr. Jorja Loa   . Gout 2009  . Hyperlipidemia   . Hypertension   . Spinal stenosis     Past Surgical History:  Procedure Laterality Date  . CESAREAN SECTION    . COLONOSCOPY  04/09/2009   [id number] papilla otherwise, normal rectum/Left-sided diverticula/Cecal polyp status post cold snare and removal/The remainder of the colonic mucosa appeared normal. Adenomatous polyps, next TCS 03/2016 per RMR.    Current Outpatient Prescriptions  Medication Sig Dispense Refill  . allopurinol (ZYLOPRIM) 300 MG tablet TAKE 1 TABLET(300 MG) BY MOUTH DAILY 30 tablet 5  . amLODipine (NORVASC) 10 MG tablet TAKE 1 TABLET BY MOUTH EVERY DAY 30 tablet 3  . aspirin (ASPIRIN LOW DOSE) 81 MG EC tablet Take 81 mg by mouth daily.      Marland Kitchen atenolol (TENORMIN) 50 MG tablet TAKE 1 TABLET(50 MG) BY MOUTH DAILY 30 tablet 5  . Calcium Carb-Cholecalciferol (CALCIUM 600/VITAMIN D3) 600-800 MG-UNIT TABS Take 1 tablet by mouth daily.    . cycloSPORINE (RESTASIS) 0.05 % ophthalmic  emulsion Place 1 drop into both eyes 2 (two) times daily.      . Flaxseed, Linseed, (FLAX SEED OIL) 1000 MG CAPS Take by mouth daily.      Marland Kitchen gabapentin (NEURONTIN) 300 MG capsule TAKE 1 CAPSULE BY MOUTH EVERY NIGHT AT BEDTIME 90 capsule 0  . ibuprofen (ADVIL,MOTRIN) 800 MG tablet TAKE 1 TABLET BY MOUTH WEEKLY IF NEEDED FOR UNCONTROLLED BACK PAIN 30 tablet 0  . methocarbamol (ROBAXIN) 500 MG tablet Take 1 tablet (500 mg total) by mouth at bedtime as needed for muscle spasms. 30 tablet 1  . Omega-3 Fatty Acids (FISH OIL) 1000 MG CAPS Take by mouth daily.      Marland Kitchen spironolactone (ALDACTONE) 50 MG tablet TAKE 1 TABLET(50 MG) BY MOUTH DAILY 30 tablet 5   No current facility-administered medications for this visit.     Allergies as of 05/06/2016 - Review Complete 05/06/2016  Allergen Reaction Noted  . Benazepril Other (See Comments) 11/30/2010    Family History  Problem Relation Age of Onset  . Stroke Mother   . Stroke Father   . Diabetes Sister   . Pulmonary embolism Sister   . Colon cancer Neg Hx     Social History   Social History  . Marital status: Widowed    Spouse name: N/A  . Number of children: 2  . Years of education: N/A   Occupational History  . retired     Social History Main Topics  . Smoking status: Former Smoker    Quit date: 10/07/1983  .  Smokeless tobacco: Never Used  . Alcohol use No  . Drug use: No  . Sexual activity: Not Currently   Other Topics Concern  . None   Social History Narrative  . None    Review of Systems: General: Negative for anorexia, weight loss, fever, chills, fatigue, weakness. ENT: Negative for hoarseness, difficulty swallowing. CV: Negative for chest pain, angina, palpitations, peripheral edema.  Respiratory: Negative for dyspnea at rest, cough, sputum, wheezing.  GI: See history of present illness.  MS: Admits occasional back pain.  Derm: Negative for rash or itching.  Endo: Negative for unusual weight change.  Heme: Negative  for bruising or bleeding. Allergy: Negative for rash or hives.   Physical Exam: BP 134/81   Pulse 68   Temp 98.3 F (36.8 C) (Oral)   Ht 5' 3.5" (1.613 m)   Wt 158 lb 12.8 oz (72 kg)   BMI 27.69 kg/m  General:   Alert and oriented. Pleasant and cooperative. Well-nourished and well-developed.  Head:  Normocephalic and atraumatic. Eyes:  Without icterus, sclera clear and conjunctiva pink.  Ears:  Normal auditory acuity. Cardiovascular:  S1, S2 present without murmurs appreciated. Extremities without clubbing or edema. Respiratory:  Clear to auscultation bilaterally. No wheezes, rales, or rhonchi. No distress.  Gastrointestinal:  +BS, rounded but soft, non-tender and non-distended. No HSM noted. No guarding or rebound. No masses appreciated.  Rectal:  Deferred  Musculoskalatal:  Symmetrical without gross deformities. Neurologic:  Alert and oriented x4;  grossly normal neurologically. Psych:  Alert and cooperative. Normal mood and affect. Heme/Lymph/Immune: No excessive bruising noted.    05/06/2016 9:13 AM   Disclaimer: This note was dictated with voice recognition software. Similar sounding words can inadvertently be transcribed and may not be corrected upon review.

## 2016-05-06 NOTE — Progress Notes (Signed)
cc'ed to pcp °

## 2016-05-06 NOTE — Assessment & Plan Note (Signed)
The patient has a history of colon polyps. Last colonoscopy 7 years ago with a single polyp that was found to be adenomatous. Recommended 7 year repeat colonoscopy. She is generally asymptomatic from a GI standpoint. We will proceed with scheduling recommended colonoscopy.  Proceed with TCS with Dr. Gala Romney in near future: the risks, benefits, and alternatives have been discussed with the patient in detail. The patient states understanding and desires to proceed.  The patient is not on any anticoagulants, anxiolytics, chronic pain medications, or antidepressants. Conscious sedation should be adequate for her procedure as it was for her last procedure.

## 2016-05-06 NOTE — Patient Instructions (Signed)
1. We will schedule your procedure for you. 2. Return for follow-up based on recommendations made after your procedure. 

## 2016-05-17 ENCOUNTER — Other Ambulatory Visit: Payer: Self-pay | Admitting: Family Medicine

## 2016-05-19 ENCOUNTER — Ambulatory Visit (HOSPITAL_COMMUNITY)
Admission: RE | Admit: 2016-05-19 | Discharge: 2016-05-19 | Disposition: A | Discharge status: Home / Self Care | Payer: PPO | Source: Ambulatory Visit | Attending: Internal Medicine | Admitting: Internal Medicine

## 2016-05-19 ENCOUNTER — Encounter (HOSPITAL_COMMUNITY): Payer: Self-pay

## 2016-05-19 ENCOUNTER — Encounter (HOSPITAL_COMMUNITY)
Admission: RE | Disposition: A | Discharge status: Home / Self Care | Payer: Self-pay | Source: Ambulatory Visit | Attending: Internal Medicine

## 2016-05-19 DIAGNOSIS — K5730 Diverticulosis of large intestine without perforation or abscess without bleeding: Secondary | ICD-10-CM | POA: Diagnosis not present

## 2016-05-19 DIAGNOSIS — Z1211 Encounter for screening for malignant neoplasm of colon: Secondary | ICD-10-CM | POA: Insufficient documentation

## 2016-05-19 DIAGNOSIS — M109 Gout, unspecified: Secondary | ICD-10-CM | POA: Diagnosis not present

## 2016-05-19 DIAGNOSIS — Z7982 Long term (current) use of aspirin: Secondary | ICD-10-CM | POA: Insufficient documentation

## 2016-05-19 DIAGNOSIS — Z79899 Other long term (current) drug therapy: Secondary | ICD-10-CM | POA: Insufficient documentation

## 2016-05-19 DIAGNOSIS — Z860100 Personal history of colon polyps, unspecified: Secondary | ICD-10-CM

## 2016-05-19 DIAGNOSIS — I10 Essential (primary) hypertension: Secondary | ICD-10-CM | POA: Diagnosis not present

## 2016-05-19 HISTORY — PX: COLONOSCOPY: SHX5424

## 2016-05-19 SURGERY — COLONOSCOPY
Anesthesia: Moderate Sedation

## 2016-05-19 MED ORDER — MIDAZOLAM HCL 5 MG/5ML IJ SOLN
INTRAMUSCULAR | Status: AC
  Filled 2016-05-19: qty 10

## 2016-05-19 MED ORDER — MEPERIDINE HCL 100 MG/ML IJ SOLN
INTRAMUSCULAR | Status: AC
  Filled 2016-05-19: qty 2

## 2016-05-19 MED ORDER — ONDANSETRON HCL 4 MG/2ML IJ SOLN
INTRAMUSCULAR | Status: DC | PRN
  Administered 2016-05-19: 4 mg via INTRAVENOUS

## 2016-05-19 MED ORDER — MEPERIDINE HCL 100 MG/ML IJ SOLN
INTRAMUSCULAR | Status: DC | PRN
  Administered 2016-05-19: 50 mg via INTRAVENOUS
  Administered 2016-05-19: 25 mg via INTRAVENOUS

## 2016-05-19 MED ORDER — STERILE WATER FOR IRRIGATION IR SOLN
Status: DC | PRN
  Administered 2016-05-19: 2.5 mL

## 2016-05-19 MED ORDER — ONDANSETRON HCL 4 MG/2ML IJ SOLN
INTRAMUSCULAR | Status: AC
  Filled 2016-05-19: qty 2

## 2016-05-19 MED ORDER — SODIUM CHLORIDE 0.9 % IV SOLN
INTRAVENOUS | Status: DC
Start: 2016-05-19 — End: 2016-05-19
  Administered 2016-05-19: 12:00:00 via INTRAVENOUS

## 2016-05-19 MED ORDER — MIDAZOLAM HCL 5 MG/5ML IJ SOLN
INTRAMUSCULAR | Status: DC | PRN
  Administered 2016-05-19 (×2): 2 mg via INTRAVENOUS

## 2016-05-19 NOTE — Interval H&P Note (Signed)
History and Physical Interval Note:  05/19/2016 12:48 PM  Kristen Cox  has presented today for surgery, with the diagnosis of History colon polyps  The various methods of treatment have been discussed with the patient and family. After consideration of risks, benefits and other options for treatment, the patient has consented to  Procedure(s) with comments: COLONOSCOPY (N/A) - 1:00 PM as a surgical intervention .  The patient's history has been reviewed, patient examined, no change in status, stable for surgery.  I have reviewed the patient's chart and labs.  Questions were answered to the patient's satisfaction.    No change. Surveillance colonoscopy per plan.  The risks, benefits, limitations, alternatives and imponderables have been reviewed with the patient. Questions have been answered. All parties are agreeable.  Manus Rudd

## 2016-05-19 NOTE — Op Note (Signed)
[facility] Patient Name: Kristen Cox Procedure Date: 05/19/2016 12:43 PM MRN: [id number] Date of Birth: [birth year] Attending MD: Norvel Richards , MD CSN: [id number] Age: 75 Admit Type: Outpatient Procedure:                Colonoscopy Indications:              High risk colon cancer surveillance: Personal                            history of colonic polyps Providers:                Norvel Richards, MD, Lurline Del, RN, Randa Spike, Technician Referring MD:              Medicines:                Midazolam 4 mg IV, Meperidine 75 mg IV, Ondansetron                            4 mg IV Complications:            No immediate complications. Estimated blood loss:                            None. Estimated Blood Loss:     Estimated blood loss: none. Procedure:                Pre-Anesthesia Assessment:                           - Prior to the procedure, a History and Physical                            was performed, and patient medications and                            allergies were reviewed. The patient's tolerance of                            previous anesthesia was also reviewed. The risks                            and benefits of the procedure and the sedation                            options and risks were discussed with the patient.                            All questions were answered, and informed consent                            was obtained. Prior Anticoagulants: The patient has                            taken  no previous anticoagulant or antiplatelet                            agents. ASA Grade Assessment: II - A patient with                            mild systemic disease. After reviewing the risks                            and benefits, the patient was deemed in                            satisfactory condition to undergo the procedure.                           After obtaining informed consent, the colonoscope                    was passed under direct vision. Throughout the                            procedure, the patient's blood pressure, pulse, and                            oxygen saturations were monitored continuously. The                            [id number] scope was introduced through                            the anus and advanced to the the cecum, identified                            by appendiceal orifice and ileocecal valve. The                            colonoscopy was performed without difficulty. The                            patient tolerated the procedure well. The quality                            of the bowel preparation was adequate. The                            ileocecal valve, appendiceal orifice, and rectum                            were photographed. Scope In: 12:58:00 PM Scope Out: 1:13:14 PM Scope Withdrawal Time: 0 hours 9 minutes 0 seconds  Total Procedure Duration: 0 hours 15 minutes 14 seconds  Findings:      The perianal and digital rectal examinations were normal.      Scattered small and large-mouthed diverticula were found in the sigmoid       colon and descending colon.  The remainder of the colonic mucosa appeared       normal. Impression:               - Diverticulosis in the sigmoid colon and in the                            descending colon.                           - No specimens collected. Moderate Sedation:      Moderate (conscious) sedation was personally administered by an       anesthesia professional. The following parameters were monitored: oxygen       saturation, heart rate, blood pressure, respiratory rate, EKG, adequacy       of pulmonary ventilation, and response to care. Total physician       intraservice time was 23 minutes. Recommendation:           - Patient has a contact number available for                            emergencies. The signs and symptoms of potential                            delayed complications  were discussed with the                            patient. Return to normal activities tomorrow.                            Written discharge instructions were provided to the                            patient.                           - Advance diet as tolerated.                           - Continue present medications.                           - No repeat colonoscopy due to age.                           - Return to GI clinic PRN. Procedure Code(s):        --- Professional ---                           [phone], Colonoscopy, flexible; diagnostic, including                            collection of specimen(s) by brushing or washing,                            when performed (separate procedure) Diagnosis Code(s):        --- Professional ---  Z86.010, Personal history of colonic polyps                           K57.30, Diverticulosis of large intestine without                            perforation or abscess without bleeding CPT copyright 2016 [employer]. All rights reserved. The codes documented in this report are preliminary and upon coder review may  be revised to meet current compliance requirements. Cristopher Estimable. Rourk, MD Norvel Richards, MD 05/19/2016 1:26:20 PM This report has been signed electronically. Number of Addenda: 0

## 2016-05-19 NOTE — H&P (View-Only) (Signed)
Referring Provider: Fayrene Helper, MD Primary Care Physician:  Tula Nakayama, MD Primary GI:  Dr. Gala Romney  Chief Complaint  Patient presents with  . Colonoscopy    hx adenomatous polyps    HPI:   Kristen Cox is a 75 y.o. female who presents on recall for repeat colonoscopy. She has a history of colon polyps. Last colonoscopy completed 04/09/2009 which found anal papilla, otherwise normal rectum, left-sided diverticula, cecal polyp, remainder of colonic mucosa normal. Surgical pathology found the polyp to be adenomatous. Recommended repeat colonoscopy in 7 years.  Today she states she's doing great. Denies abdominal pain, N/V, hematochezia, melena, fever, chills, unintentional weight loss, changes in bowel habits. Denies chest pain, dyspnea, dizziness, lightheadedness, syncope, near syncope. Denies any other upper or lower GI symptoms. Denies sleep apnea.  Past Medical History:  Diagnosis Date  . Dry eyes 2010   on restasis - Dr. Jorja Loa   . Gout 2009  . Hyperlipidemia   . Hypertension   . Spinal stenosis     Past Surgical History:  Procedure Laterality Date  . CESAREAN SECTION    . COLONOSCOPY  04/09/2009   [id number] papilla otherwise, normal rectum/Left-sided diverticula/Cecal polyp status post cold snare and removal/The remainder of the colonic mucosa appeared normal. Adenomatous polyps, next TCS 03/2016 per RMR.    Current Outpatient Prescriptions  Medication Sig Dispense Refill  . allopurinol (ZYLOPRIM) 300 MG tablet TAKE 1 TABLET(300 MG) BY MOUTH DAILY 30 tablet 5  . amLODipine (NORVASC) 10 MG tablet TAKE 1 TABLET BY MOUTH EVERY DAY 30 tablet 3  . aspirin (ASPIRIN LOW DOSE) 81 MG EC tablet Take 81 mg by mouth daily.      Marland Kitchen atenolol (TENORMIN) 50 MG tablet TAKE 1 TABLET(50 MG) BY MOUTH DAILY 30 tablet 5  . Calcium Carb-Cholecalciferol (CALCIUM 600/VITAMIN D3) 600-800 MG-UNIT TABS Take 1 tablet by mouth daily.    . cycloSPORINE (RESTASIS) 0.05 % ophthalmic  emulsion Place 1 drop into both eyes 2 (two) times daily.      . Flaxseed, Linseed, (FLAX SEED OIL) 1000 MG CAPS Take by mouth daily.      Marland Kitchen gabapentin (NEURONTIN) 300 MG capsule TAKE 1 CAPSULE BY MOUTH EVERY NIGHT AT BEDTIME 90 capsule 0  . ibuprofen (ADVIL,MOTRIN) 800 MG tablet TAKE 1 TABLET BY MOUTH WEEKLY IF NEEDED FOR UNCONTROLLED BACK PAIN 30 tablet 0  . methocarbamol (ROBAXIN) 500 MG tablet Take 1 tablet (500 mg total) by mouth at bedtime as needed for muscle spasms. 30 tablet 1  . Omega-3 Fatty Acids (FISH OIL) 1000 MG CAPS Take by mouth daily.      Marland Kitchen spironolactone (ALDACTONE) 50 MG tablet TAKE 1 TABLET(50 MG) BY MOUTH DAILY 30 tablet 5   No current facility-administered medications for this visit.     Allergies as of 05/06/2016 - Review Complete 05/06/2016  Allergen Reaction Noted  . Benazepril Other (See Comments) 11/30/2010    Family History  Problem Relation Age of Onset  . Stroke Mother   . Stroke Father   . Diabetes Sister   . Pulmonary embolism Sister   . Colon cancer Neg Hx     Social History   Social History  . Marital status: Widowed    Spouse name: N/A  . Number of children: 2  . Years of education: N/A   Occupational History  . retired     Social History Main Topics  . Smoking status: Former Smoker    Quit date: 10/07/1983  .  Smokeless tobacco: Never Used  . Alcohol use No  . Drug use: No  . Sexual activity: Not Currently   Other Topics Concern  . None   Social History Narrative  . None    Review of Systems: General: Negative for anorexia, weight loss, fever, chills, fatigue, weakness. ENT: Negative for hoarseness, difficulty swallowing. CV: Negative for chest pain, angina, palpitations, peripheral edema.  Respiratory: Negative for dyspnea at rest, cough, sputum, wheezing.  GI: See history of present illness.  MS: Admits occasional back pain.  Derm: Negative for rash or itching.  Endo: Negative for unusual weight change.  Heme: Negative  for bruising or bleeding. Allergy: Negative for rash or hives.   Physical Exam: BP 134/81   Pulse 68   Temp 98.3 F (36.8 C) (Oral)   Ht 5' 3.5" (1.613 m)   Wt 158 lb 12.8 oz (72 kg)   BMI 27.69 kg/m  General:   Alert and oriented. Pleasant and cooperative. Well-nourished and well-developed.  Head:  Normocephalic and atraumatic. Eyes:  Without icterus, sclera clear and conjunctiva pink.  Ears:  Normal auditory acuity. Cardiovascular:  S1, S2 present without murmurs appreciated. Extremities without clubbing or edema. Respiratory:  Clear to auscultation bilaterally. No wheezes, rales, or rhonchi. No distress.  Gastrointestinal:  +BS, rounded but soft, non-tender and non-distended. No HSM noted. No guarding or rebound. No masses appreciated.  Rectal:  Deferred  Musculoskalatal:  Symmetrical without gross deformities. Neurologic:  Alert and oriented x4;  grossly normal neurologically. Psych:  Alert and cooperative. Normal mood and affect. Heme/Lymph/Immune: No excessive bruising noted.    05/06/2016 9:13 AM   Disclaimer: This note was dictated with voice recognition software. Similar sounding words can inadvertently be transcribed and may not be corrected upon review.

## 2016-05-19 NOTE — Discharge Instructions (Signed)
Colonoscopy Discharge Instructions  Read the instructions outlined below and refer to this sheet in the next few weeks. These discharge instructions provide you with general information on caring for yourself after you leave the hospital. Your doctor may also give you specific instructions. While your treatment has been planned according to the most current medical practices available, unavoidable complications occasionally occur. If you have any problems or questions after discharge, call Dr. Gala Romney at [phone]. ACTIVITY  You may resume your regular activity, but move at a slower pace for the next 24 hours.   Take frequent rest periods for the next 24 hours.   Walking will help get rid of the air and reduce the bloated feeling in your belly (abdomen).   No driving for 24 hours (because of the medicine (anesthesia) used during the test).    Do not sign any important legal documents or operate any machinery for 24 hours (because of the anesthesia used during the test).  NUTRITION  Drink plenty of fluids.   You may resume your normal diet as instructed by your doctor.   Begin with a light meal and progress to your normal diet. Heavy or fried foods are harder to digest and may make you feel sick to your stomach (nauseated).   Avoid alcoholic beverages for 24 hours or as instructed.  MEDICATIONS  You may resume your normal medications unless your doctor tells you otherwise.  WHAT YOU CAN EXPECT TODAY  Some feelings of bloating in the abdomen.   Passage of more gas than usual.   Spotting of blood in your stool or on the toilet paper.  IF YOU HAD POLYPS REMOVED DURING THE COLONOSCOPY:  No aspirin products for 7 days or as instructed.   No alcohol for 7 days or as instructed.   Eat a soft diet for the next 24 hours.  FINDING OUT THE RESULTS OF YOUR TEST Not all test results are available during your visit. If your test results are not back during the visit, make an appointment  with your caregiver to find out the results. Do not assume everything is normal if you have not heard from your caregiver or the medical facility. It is important for you to follow up on all of your test results.  SEEK IMMEDIATE MEDICAL ATTENTION IF:  You have more than a spotting of blood in your stool.   Your belly is swollen (abdominal distention).   You are nauseated or vomiting.   You have a temperature over 101.   You have abdominal pain or discomfort that is severe or gets worse throughout the day.    Diverticulosis information provided  I do not recommend a future colonoscopy unless new symptoms develop   Diverticulosis Diverticulosis is the condition that develops when small pouches (diverticula) form in the wall of your colon. Your colon, or large intestine, is where water is absorbed and stool is formed. The pouches form when the inside layer of your colon pushes through weak spots in the outer layers of your colon. CAUSES  No one knows exactly what causes diverticulosis. RISK FACTORS  Being older than 39. Your risk for this condition increases with age. Diverticulosis is rare in people younger than 40 years. By age 68, almost everyone has it.  Eating a low-fiber diet.  Being frequently constipated.  Being overweight.  Not getting enough exercise.  Smoking.  Taking over-the-counter pain medicines, like aspirin and ibuprofen. SYMPTOMS  Most people with diverticulosis do not have symptoms. DIAGNOSIS  Because diverticulosis often has no symptoms, health care providers often discover the condition during an exam for other colon problems. In many cases, a health care provider will diagnose diverticulosis while using a flexible scope to examine the colon (colonoscopy). TREATMENT  If you have never developed an infection related to diverticulosis, you may not need treatment. If you have had an infection before, treatment may include:  Eating more fruits, vegetables,  and grains.  Taking a fiber supplement.  Taking a live bacteria supplement (probiotic).  Taking medicine to relax your colon. HOME CARE INSTRUCTIONS   Drink at least 6-8 glasses of water each day to prevent constipation.  Try not to strain when you have a bowel movement.  Keep all follow-up appointments. If you have had an infection before:  Increase the fiber in your diet as directed by your health care provider or dietitian.  Take a dietary fiber supplement if your health care provider approves.  Only take medicines as directed by your health care provider. SEEK MEDICAL CARE IF:   You have abdominal pain.  You have bloating.  You have cramps.  You have not gone to the bathroom in 3 days. SEEK IMMEDIATE MEDICAL CARE IF:   Your pain gets worse.  Yourbloating becomes very bad.  You have a fever or chills, and your symptoms suddenly get worse.  You begin vomiting.  You have bowel movements that are bloody or black. MAKE SURE YOU:  Understand these instructions.  Will watch your condition.  Will get help right away if you are not doing well or get worse.   This information is not intended to replace advice given to you by your health care provider. Make sure you discuss any questions you have with your health care provider.   Document Released: 05/06/2004 Document Revised: 08/14/2013 Document Reviewed: 07/04/2013 Elsevier Interactive Patient Education [employer].

## 2016-05-24 ENCOUNTER — Encounter (HOSPITAL_COMMUNITY): Payer: Self-pay | Admitting: Internal Medicine

## 2016-06-10 ENCOUNTER — Telehealth: Payer: Self-pay | Admitting: Family Medicine

## 2016-06-10 ENCOUNTER — Other Ambulatory Visit: Payer: Self-pay | Admitting: Family Medicine

## 2016-06-10 DIAGNOSIS — Z0000 Encounter for general adult medical examination without abnormal findings: Secondary | ICD-10-CM

## 2016-06-10 DIAGNOSIS — I10 Essential (primary) hypertension: Secondary | ICD-10-CM

## 2016-06-10 DIAGNOSIS — E790 Hyperuricemia without signs of inflammatory arthritis and tophaceous disease: Secondary | ICD-10-CM

## 2016-06-10 MED ORDER — PREDNISONE 5 MG (21) PO TBPK: 5.0000 mg | ORAL_TABLET | ORAL | 0 refills | Status: DC

## 2016-06-10 NOTE — Telephone Encounter (Signed)
Patient states pain x 2 weeks.  She tried medications prescribed for gout with no relief.  No falls or noted injuries.  Please advise.  States that pain is mainly in heel.

## 2016-06-10 NOTE — Telephone Encounter (Signed)
Patient aware.  Appointment scheduled and she will have labs done next week before appointment

## 2016-06-10 NOTE — Telephone Encounter (Signed)
I will send pred dose pack, needs chem 7 and uric acid level, appt next week for f/u  Also, if no better overnight and wants to be seen in am , work in as acute  Visit with Dr Hayden Pedro if availability pls

## 2016-06-10 NOTE — Telephone Encounter (Signed)
Kristen Cox is calling stating that [name] is hurting so bad that she can hardly walk, please advise?

## 2016-06-14 DIAGNOSIS — E790 Hyperuricemia without signs of inflammatory arthritis and tophaceous disease: Secondary | ICD-10-CM | POA: Diagnosis not present

## 2016-06-14 DIAGNOSIS — I10 Essential (primary) hypertension: Secondary | ICD-10-CM | POA: Diagnosis not present

## 2016-06-15 LAB — BASIC METABOLIC PANEL
BUN: 29 mg/dL — AB (ref 7–25)
CO2: 23 mmol/L (ref 20–31)
CREATININE: 0.9 mg/dL (ref 0.60–0.93)
Calcium: 10.2 mg/dL (ref 8.6–10.4)
Chloride: 107 mmol/L (ref 98–110)
GLUCOSE: 114 mg/dL — AB (ref 65–99)
POTASSIUM: 4.5 mmol/L (ref 3.5–5.3)
Sodium: 139 mmol/L (ref 135–146)

## 2016-06-15 LAB — URIC ACID: Uric Acid, Serum: 5.4 mg/dL (ref 2.5–7.0)

## 2016-06-16 ENCOUNTER — Ambulatory Visit (INDEPENDENT_AMBULATORY_CARE_PROVIDER_SITE_OTHER): Payer: PPO | Admitting: Family Medicine

## 2016-06-16 ENCOUNTER — Encounter: Payer: Self-pay | Admitting: Family Medicine

## 2016-06-16 VITALS — BP 140/74 | HR 62 | Resp 18 | Ht 63.5 in | Wt 157.0 lb

## 2016-06-16 DIAGNOSIS — M79671 Pain in right foot: Secondary | ICD-10-CM | POA: Diagnosis not present

## 2016-06-16 DIAGNOSIS — G8929 Other chronic pain: Secondary | ICD-10-CM | POA: Diagnosis not present

## 2016-06-16 DIAGNOSIS — M7989 Other specified soft tissue disorders: Secondary | ICD-10-CM | POA: Insufficient documentation

## 2016-06-16 DIAGNOSIS — I10 Essential (primary) hypertension: Secondary | ICD-10-CM

## 2016-06-16 MED ORDER — KETOROLAC TROMETHAMINE 60 MG/2ML IM SOLN
60.0000 mg | Freq: Once | INTRAMUSCULAR | Status: AC
  Administered 2016-06-16: 60 mg via INTRAMUSCULAR

## 2016-06-16 MED ORDER — METHYLPREDNISOLONE ACETATE 80 MG/ML IJ SUSP
80.0000 mg | Freq: Once | INTRAMUSCULAR | Status: AC
Start: 2016-06-16 — End: 2016-06-16
  Administered 2016-06-16: 80 mg via INTRAMUSCULAR

## 2016-06-16 MED ORDER — DICLOFENAC POTASSIUM 50 MG PO TABS: 50.0000 mg | ORAL_TABLET | Freq: Three times a day (TID) | ORAL | 0 refills | Status: DC

## 2016-06-16 NOTE — Assessment & Plan Note (Addendum)
3 week h/o right calf swelling and pain, needs doppler

## 2016-06-16 NOTE — Patient Instructions (Addendum)
Annual exam as before, in Novemebr  Injections today for right foot and heel pain and diclofenac is  prescribed  Xray and [place] of right foot and leg today, when you leave here  Call next Monday if no better, you will be referred to Podiatry  Return for flu vaccine, you are able to get it tioday

## 2016-06-20 NOTE — Assessment & Plan Note (Signed)
Uncontrolled.Toradol and depo medrol administered IM in the office , to be followed by a short course of oral prednisone and NSAIDS.  

## 2016-06-20 NOTE — Assessment & Plan Note (Signed)
Recent uric acid level is normal so not a gout flare

## 2016-06-20 NOTE — Assessment & Plan Note (Signed)
Controlled, no change in medication DASH diet and commitment to daily physical activity for a minimum of 30 minutes discussed and encouraged, as a part of hypertension management. The importance of attaining a healthy weight is also discussed.  BP/Weight 06/16/2016 05/19/2016 05/06/2016 04/19/2016 02/17/2016 12/27/2015 [id number]  Systolic BP XX123456 [id number] Q000111Q A999333 0000000 Q000111Q 123456  Diastolic BP 74 61 81 80 72 78 70  Wt. (Lbs) 157 - 158.8 157 158.12 - 160  BMI 27.38 - 27.69 28.72 28.91 - 29.26

## 2016-06-20 NOTE — Progress Notes (Addendum)
   Kristen Cox     MRN: [id number]      DOB: 1940/11/28   HPI [name] is here with 3 week h/o right heel pain, no trauma, has tried medication which she has with no success. C/o right calf swelling and warmth, denies shortness of breath or cough   ROS Denies recent fever or chills. Denies sinus pressure, nasal congestion, ear pain or sore throat. Denies chest congestion, productive cough or wheezing. Denies chest pains, palpitations and leg swelling Denies abdominal pain, nausea, vomiting,diarrhea or constipation.    Denies depression, anxiety or insomnia. Denies skin break down or rash.   PE  BP 140/74   Pulse 62   Resp 18   Ht 5' 3.5" (1.613 m)   Wt 157 lb (71.2 kg)   SpO2 97%   BMI 27.38 kg/m   Patient alert and oriented and in no cardiopulmonary distress.  HEENT: No facial asymmetry, EOMI,   oropharynx pink and moist.  Neck supple no JVD, no mass.  Chest: Clear to auscultation bilaterally.  CVS: S1, S2 no murmurs, no S3.Regular rate.  ABD: Soft non tender.   Ext: No edema  MS: Adequate ROM spine, shoulders, hips and knees. Right calf warm , swollen and tender Tender over right heel on palpation, no warmth or redness Skin: Intact, no ulcerations or rash noted.  Psych: Good eye contact, normal affect. Memory intact not anxious or depressed appearing.  CNS: CN 2-12 intact, power,  normal throughout.no focal deficits noted.   Assessment & Plan  Right leg swelling 3 week h/o right calf swelling and pain, needs doppler  Heel pain, chronic, right Uncontrolled.Toradol and depo medrol administered IM in the office , to be followed by a short course of oral prednisone and NSAIDS.   Essential hypertension Controlled, no change in medication DASH diet and commitment to daily physical activity for a minimum of 30 minutes discussed and encouraged, as a part of hypertension management. The importance of attaining a healthy weight is also  discussed.  BP/Weight 06/16/2016 05/19/2016 05/06/2016 04/19/2016 02/17/2016 12/27/2015 [id number]  Systolic BP XX123456 A999333 Q000111Q A999333 0000000 Q000111Q 123456  Diastolic BP 74 61 81 80 72 78 70  Wt. (Lbs) 157 - 158.8 157 158.12 - 160  BMI 27.38 - 27.69 28.72 28.91 - 29.26       Gout Recent uric acid level is normal so not a gout flare  At end of visit when nurse was exiting pt, she refused both imaging studies requested, x ray of foot as well as venous doppler, stated she will call back if pain persists

## 2016-06-21 ENCOUNTER — Telehealth: Payer: Self-pay

## 2016-06-21 ENCOUNTER — Telehealth: Payer: Self-pay | Admitting: Family Medicine

## 2016-06-21 ENCOUNTER — Other Ambulatory Visit: Payer: Self-pay | Admitting: Family Medicine

## 2016-06-21 NOTE — Telephone Encounter (Signed)
Dr. Moshe Cipro, Corliss Skains wanted me to let you know her foot Is a lot better today, she was able to walk 45 mins this morning

## 2016-06-21 NOTE — Telephone Encounter (Signed)
Coreg entered in its place , needs 4 week nurse BP check, pls notify and send pharmacy , also pt

## 2016-06-22 ENCOUNTER — Other Ambulatory Visit: Payer: Self-pay

## 2016-06-22 MED ORDER — CARVEDILOL 6.25 MG PO TABS: 6.2500 mg | ORAL_TABLET | Freq: Two times a day (BID) | ORAL | 3 refills | Status: DC

## 2016-06-22 NOTE — Telephone Encounter (Signed)
Patient aware.  Has appt 11/16.  Will re-access bp then.  Medication sent to pharmacy.

## 2016-06-28 ENCOUNTER — Telehealth: Payer: Self-pay | Admitting: Family Medicine

## 2016-06-28 NOTE — Telephone Encounter (Signed)
Kristen Cox is stating that she took the new BP medicaton and her legs is knotting up and causing leg cramps real bad, please advise?

## 2016-06-28 NOTE — Telephone Encounter (Signed)
Please advise.  Atenolol was changed to Coreg.

## 2016-06-29 ENCOUNTER — Other Ambulatory Visit: Payer: Self-pay | Admitting: Family Medicine

## 2016-06-29 MED ORDER — METOPROLOL SUCCINATE ER 25 MG PO TB24: 25.0000 mg | ORAL_TABLET | Freq: Every day | ORAL | 1 refills | Status: DC

## 2016-06-29 NOTE — Telephone Encounter (Signed)
Patient aware and medication sent to pharmacy  

## 2016-06-29 NOTE — Telephone Encounter (Signed)
Pls let pt know metoprolol has been sent in, stop coreg, hopefully no cramps. Script is printed pls fax

## 2016-07-01 DIAGNOSIS — R7301 Impaired fasting glucose: Secondary | ICD-10-CM | POA: Diagnosis not present

## 2016-07-01 DIAGNOSIS — I10 Essential (primary) hypertension: Secondary | ICD-10-CM | POA: Diagnosis not present

## 2016-07-01 DIAGNOSIS — M109 Gout, unspecified: Secondary | ICD-10-CM | POA: Diagnosis not present

## 2016-07-01 LAB — BASIC METABOLIC PANEL WITH GFR
BUN: 26 mg/dL — AB (ref 7–25)
CHLORIDE: 104 mmol/L (ref 98–110)
CO2: 22 mmol/L (ref 20–31)
CREATININE: 1.23 mg/dL — AB (ref 0.60–0.93)
Calcium: 10.3 mg/dL (ref 8.6–10.4)
GFR, Est African American: 50 mL/min — ABNORMAL LOW (ref >=60)
GFR, Est Non African American: 43 mL/min — ABNORMAL LOW (ref >=60)
GLUCOSE: 101 mg/dL — AB (ref 65–99)
Potassium: 5.9 mmol/L — ABNORMAL HIGH (ref 3.5–5.3)
Sodium: 136 mmol/L (ref 135–146)

## 2016-07-01 LAB — HEMOGLOBIN A1C
Hgb A1c MFr Bld: 5.6 % (ref <5.7)
Mean Plasma Glucose: 114 mg/dL

## 2016-07-01 LAB — URIC ACID: Uric Acid, Serum: 2.7 mg/dL (ref 2.5–7.0)

## 2016-07-02 ENCOUNTER — Other Ambulatory Visit: Payer: Self-pay | Admitting: Family Medicine

## 2016-07-02 ENCOUNTER — Other Ambulatory Visit: Payer: Self-pay

## 2016-07-02 MED ORDER — ALLOPURINOL 100 MG PO TABS: 100.0000 mg | ORAL_TABLET | Freq: Every day | ORAL | 6 refills | Status: DC

## 2016-07-08 ENCOUNTER — Ambulatory Visit (INDEPENDENT_AMBULATORY_CARE_PROVIDER_SITE_OTHER): Payer: PPO | Admitting: Family Medicine

## 2016-07-08 ENCOUNTER — Encounter: Payer: Self-pay | Admitting: Family Medicine

## 2016-07-08 ENCOUNTER — Encounter: Payer: PPO | Admitting: Family Medicine

## 2016-07-08 VITALS — BP 150/82 | HR 105 | Resp 16 | Ht 63.5 in | Wt 158.0 lb

## 2016-07-08 DIAGNOSIS — I10 Essential (primary) hypertension: Secondary | ICD-10-CM

## 2016-07-08 DIAGNOSIS — Z23 Encounter for immunization: Secondary | ICD-10-CM

## 2016-07-08 NOTE — Patient Instructions (Signed)
Physical exam in December as before   Flu vaccine today    STOP metoprolol , blood pressure good and since it causes cramps , no need to take

## 2016-07-12 NOTE — Assessment & Plan Note (Signed)
After obtaining informed consent, the vaccine is  administered by LPN.  

## 2016-07-12 NOTE — Assessment & Plan Note (Signed)
Adequate control, since intoleran to 2 alternat betablockers tried , will remain on amlodipine and spironolactone only DASH diet and commitment to daily physical activity for a minimum of 30 minutes discussed and encouraged, as a part of hypertension management. The importance of attaining a healthy weight is also discussed.  BP/Weight 07/08/2016 06/16/2016 05/19/2016 05/06/2016 04/19/2016 [id number]  Systolic BP Q000111Q XX123456 A999333 Q000111Q A999333 0000000 Q000111Q  Diastolic BP 82 74 61 81 80 72 78  Wt. (Lbs) 158 157 - 158.8 157 158.12 -  BMI 27.55 27.38 - 27.69 28.72 28.91 -

## 2016-07-12 NOTE — Progress Notes (Signed)
   Kristen Cox     MRN: [id number]      DOB: Oct 08, 1940   HPI Kristen Cox is here for follow up and re-evaluation of hypertension as she has adversely reacted to alternate beta blockers prescribed as atenalol is discontinued. Has hjad severe cramps and reports none since stopping medication ROS Denies recent fever or chills. Denies sinus pressure, nasal congestion, ear pain or sore throat. Denies chest congestion, productive cough or wheezing. Denies chest pains, palpitations and leg swelling Denies headaches, seizures,  PE  BP (!) 150/82   Pulse (!) 105   Resp 16   Ht 5' 3.5" (1.613 m)   Wt 158 lb (71.7 kg)   SpO2 98%   BMI 27.55 kg/m   Patient alert and oriented and in no cardiopulmonary distress.  HEENT: No facial asymmetry, EOMI,   oropharynx pink and moist.  Neck supple no JVD, no mass.  Chest: Clear to auscultation bilaterally.  CVS: S1, S2 no murmurs, no S3.Regular rate.  ABD: Soft non tender.   Ext: No edema  MS: Adequate ROM spine, shoulders, hips and knees.   CNS: CN 2-12 intact, power,  normal throughout.no focal deficits noted.   Assessment & Plan  Essential hypertension Adequate control, since intoleran to 2 alternat betablockers tried , will remain on amlodipine and spironolactone only DASH diet and commitment to daily physical activity for a minimum of 30 minutes discussed and encouraged, as a part of hypertension management. The importance of attaining a healthy weight is also discussed.  BP/Weight 07/08/2016 06/16/2016 05/19/2016 05/06/2016 04/19/2016 [id number]  Systolic BP Q000111Q [id number] A999333 Q000111Q A999333 0000000 Q000111Q  Diastolic BP 82 74 61 81 80 72 78  Wt. (Lbs) 158 157 - 158.8 157 158.12 -  BMI 27.55 27.38 - 27.69 28.72 28.91 -       Need for prophylactic vaccination and inoculation against influenza After obtaining informed consent, the vaccine is  administered by LPN.

## 2016-08-03 ENCOUNTER — Other Ambulatory Visit: Payer: Self-pay | Admitting: Family Medicine

## 2016-08-03 DIAGNOSIS — M5416 Radiculopathy, lumbar region: Secondary | ICD-10-CM

## 2016-08-17 ENCOUNTER — Ambulatory Visit (INDEPENDENT_AMBULATORY_CARE_PROVIDER_SITE_OTHER): Payer: PPO | Admitting: Family Medicine

## 2016-08-17 ENCOUNTER — Other Ambulatory Visit (HOSPITAL_COMMUNITY)
Admission: RE | Admit: 2016-08-17 | Discharge: 2016-08-17 | Disposition: A | Discharge status: Home / Self Care | Payer: PPO | Source: Ambulatory Visit | Attending: Family Medicine | Admitting: Family Medicine

## 2016-08-17 ENCOUNTER — Encounter: Payer: Self-pay | Admitting: Family Medicine

## 2016-08-17 VITALS — BP 130/82 | HR 97 | Resp 16 | Ht 64.0 in | Wt 154.0 lb

## 2016-08-17 DIAGNOSIS — K529 Noninfective gastroenteritis and colitis, unspecified: Secondary | ICD-10-CM

## 2016-08-17 DIAGNOSIS — R109 Unspecified abdominal pain: Secondary | ICD-10-CM | POA: Diagnosis not present

## 2016-08-17 DIAGNOSIS — I10 Essential (primary) hypertension: Secondary | ICD-10-CM

## 2016-08-17 LAB — COMPREHENSIVE METABOLIC PANEL
ALBUMIN: 4.1 g/dL (ref 3.5–5.0)
ALT: 22 U/L (ref 14–54)
ANION GAP: 9 (ref 5–15)
AST: 27 U/L (ref 15–41)
Alkaline Phosphatase: 63 U/L (ref 38–126)
BUN: 24 mg/dL — ABNORMAL HIGH (ref 6–20)
CALCIUM: 9.8 mg/dL (ref 8.9–10.3)
CO2: 21 mmol/L — AB (ref 22–32)
Chloride: 101 mmol/L (ref 101–111)
Creatinine, Ser: 0.9 mg/dL (ref 0.44–1.00)
GFR calc non Af Amer: >60 mL/min (ref >60)
GLUCOSE: 95 mg/dL (ref 65–99)
POTASSIUM: 3.3 mmol/L — AB (ref 3.5–5.1)
SODIUM: 131 mmol/L — AB (ref 135–145)
TOTAL PROTEIN: 7.4 g/dL (ref 6.5–8.1)
Total Bilirubin: 0.8 mg/dL (ref 0.3–1.2)

## 2016-08-17 LAB — CBC WITH DIFFERENTIAL/PLATELET
BASOS PCT: 0 %
Basophils Absolute: 0 10*3/uL (ref 0.0–0.1)
EOS ABS: 0.1 10*3/uL (ref 0.0–0.7)
EOS PCT: 2 %
HCT: 38.4 % (ref 36.0–46.0)
Hemoglobin: 13.1 g/dL (ref 12.0–15.0)
LYMPHS ABS: 1.5 10*3/uL (ref 0.7–4.0)
Lymphocytes Relative: 22 %
MCH: 32 pg (ref 26.0–34.0)
MCHC: 34.1 g/dL (ref 30.0–36.0)
MCV: 93.9 fL (ref 78.0–100.0)
MONO ABS: 0.8 10*3/uL (ref 0.1–1.0)
MONOS PCT: 12 %
NEUTROS PCT: 64 %
Neutro Abs: 4.5 10*3/uL (ref 1.7–7.7)
Platelets: 202 10*3/uL (ref 150–400)
RBC: 4.09 MIL/uL (ref 3.87–5.11)
RDW: 13 % (ref 11.5–15.5)
WBC: 7 10*3/uL (ref 4.0–10.5)

## 2016-08-17 LAB — AMYLASE: Amylase: 69 U/L (ref 28–100)

## 2016-08-17 LAB — LIPASE, BLOOD: LIPASE: 50 U/L (ref 11–51)

## 2016-08-17 MED ORDER — ONDANSETRON HCL 4 MG/2ML IJ SOLN
4.0000 mg | Freq: Once | INTRAMUSCULAR | Status: AC
  Administered 2016-08-17: 4 mg via INTRAMUSCULAR

## 2016-08-17 MED ORDER — ONDANSETRON HCL 4 MG PO TABS: 4.0000 mg | ORAL_TABLET | Freq: Three times a day (TID) | ORAL | 0 refills | Status: DC | PRN

## 2016-08-17 NOTE — Assessment & Plan Note (Signed)
Acute illness which is slowly improving Pt education done, zofran administered IM, and also ordered BRAT diet provided , pt encouraged to push fluids. Stat las ordered to establish patient's status

## 2016-08-17 NOTE — Patient Instructions (Addendum)
Annual physical exam end April, call if you need me sooner, if worsening got Emergency room,  You are treated for acute gasto enteritis and acute headache  Zofran given in office for nausea and tablets also prescribed  Labs today stat cmp, cbc and diff, amylase, lipase and CBC and diff  Very important to try to keep hydrated and gradually increase the food you eat as able   Kristen Cox Diet Introduction A bland diet consists of foods that do not have a lot of fat or fiber. Foods without fat or fiber are easier for the body to digest. They are also less likely to irritate your mouth, throat, stomach, and other parts of your gastrointestinal tract. A bland diet is sometimes called a BRAT diet. What is my plan? Your health care provider or dietitian may recommend specific changes to your diet to prevent and treat your symptoms, such as:  Eating small meals often.  Cooking food until it is soft enough to chew easily.  Chewing your food well.  Drinking fluids slowly.  Not eating foods that are very spicy, sour, or fatty.  Not eating citrus fruits, such as oranges and grapefruit. What do I need to know about this diet?  Eat a variety of foods from the bland diet food list.  Do not follow a bland diet longer than you have to.  Ask your health care provider whether you should take vitamins. What foods can I eat? Grains  Hot cereals, such as cream of wheat. Bread, crackers, or tortillas made from refined white flour. Rice. Vegetables  Canned or cooked vegetables. Mashed or boiled potatoes. Fruits  Bananas. Applesauce. Other types of cooked or canned fruit with the skin and seeds removed, such as canned peaches or pears. Meats and Other Protein Sources  Scrambled eggs. Creamy peanut butter or other nut butters. Lean, well-cooked meats, such as chicken or fish. Tofu. Soups or broths. Dairy  Low-fat dairy products, such as milk, cottage cheese, or yogurt. Beverages  Water. Herbal tea.  Apple juice. Sweets and Desserts  Pudding. Custard. Fruit gelatin. Ice cream. Fats and Oils  Mild salad dressings. Canola or olive oil. The items listed above may not be a complete list of allowed foods or beverages. Contact your dietitian for more options.  What foods are not recommended? Foods and ingredients that are often not recommended include:  Spicy foods, such as hot sauce or salsa.  Fried foods.  Sour foods, such as pickled or fermented foods.  Raw vegetables or fruits, especially citrus or berries.  Caffeinated drinks.  Alcohol.  Strongly flavored seasonings or condiments. The items listed above may not be a complete list of foods and beverages that are not allowed. Contact your dietitian for more information.  This information is not intended to replace advice given to you by your health care provider. Make sure you discuss any questions you have with your health care provider. Document Released: 12/01/2015 Document Revised: 01/15/2016 Document Reviewed: 08/21/2014  2017 Elsevier  Kristen Cox Diet Introduction A bland diet consists of foods that do not have a lot of fat or fiber. Foods without fat or fiber are easier for the body to digest. They are also less likely to irritate your mouth, throat, stomach, and other parts of your gastrointestinal tract. A bland diet is sometimes called a BRAT diet. What is my plan? Your health care provider or dietitian may recommend specific changes to your diet to prevent and treat your symptoms, such as:  Eating small meals  often.  Cooking food until it is soft enough to chew easily.  Chewing your food well.  Drinking fluids slowly.  Not eating foods that are very spicy, sour, or fatty.  Not eating citrus fruits, such as oranges and grapefruit. What do I need to know about this diet?  Eat a variety of foods from the bland diet food list.  Do not follow a bland diet longer than you have to.  Ask your health care provider  whether you should take vitamins. What foods can I eat? Grains  Hot cereals, such as cream of wheat. Bread, crackers, or tortillas made from refined white flour. Rice. Vegetables  Canned or cooked vegetables. Mashed or boiled potatoes. Fruits  Bananas. Applesauce. Other types of cooked or canned fruit with the skin and seeds removed, such as canned peaches or pears. Meats and Other Protein Sources  Scrambled eggs. Creamy peanut butter or other nut butters. Lean, well-cooked meats, such as chicken or fish. Tofu. Soups or broths. Dairy  Low-fat dairy products, such as milk, cottage cheese, or yogurt. Beverages  Water. Herbal tea. Apple juice. Sweets and Desserts  Pudding. Custard. Fruit gelatin. Ice cream. Fats and Oils  Mild salad dressings. Canola or olive oil. The items listed above may not be a complete list of allowed foods or beverages. Contact your dietitian for more options.  What foods are not recommended? Foods and ingredients that are often not recommended include:  Spicy foods, such as hot sauce or salsa.  Fried foods.  Sour foods, such as pickled or fermented foods.  Raw vegetables or fruits, especially citrus or berries.  Caffeinated drinks.  Alcohol.  Strongly flavored seasonings or condiments. The items listed above may not be a complete list of foods and beverages that are not allowed. Contact your dietitian for more information.  This information is not intended to replace advice given to you by your health care provider. Make sure you discuss any questions you have with your health care provider. Document Released: 12/01/2015 Document Revised: 01/15/2016 Document Reviewed: 08/21/2014  2017 Elsevier

## 2016-08-17 NOTE — Assessment & Plan Note (Signed)
Controlled, no change in medication  

## 2016-08-17 NOTE — Progress Notes (Signed)
   Kristen Cox     MRN: [id number]      DOB: 08-Apr-1941   HPI Kristen Cox c/o 5 day h/o poor appetite, nausea, vomiting and loose  Stools. C/o increased weakness, states she fell across her bed 4 days ago, nearly called 911, was too weak ,nad her family members have gradually been checking on her. Vomited twice yesterday. Stool is less loose and frequent, however appetite remains poor. Also reports feeling chill yesterday and staying in bed No one else she knows is having similar symptoms   S  Denies sinus pressure, nasal congestion, ear pain or sore throat. Denies chest congestion, productive cough or wheezing. Denies chest pains, palpitations and leg swelling    Denies dysuria, frequency, hesitancy or incontinence. Denies joint pain, swelling and limitation in mobility. Denies headaches, seizures, numbness, or tingling. Denies depression, anxiety or insomnia. Denies skin break down or rash.   PE  BP 130/82   Pulse 97   Resp 16   Ht 5\' 4"  (1.626 m)   Wt 154 lb (69.9 kg)   SpO2 97%   BMI 26.43 kg/m   Patient alert and oriented and in no cardiopulmonary distress.Ill appearing and looks weak, tearful as she describes her illness  HEENT: No facial asymmetry, EOMI,   oropharynx pink and moist.  Neck supple no JVD, no mass.  Chest: Clear to auscultation bilaterally.  CVS: S1, S2 no murmurs, no S3.Regular rate.  ABD: Soft  Generalized superficial tenderness, no guarding or rebound, hyperactive bowel sounds, tenderness mainly in suprapubic area  Ext: No edema  MS: Adequate though reduced ROM spine, shoulders, hips and knees.  Skin: Intact, no ulcerations or rash noted.  Psych: Good eye contact, normal affect. Memory intact not anxious or depressed appearing.  CNS: CN 2-12 intact, power,  normal throughout.no focal deficits noted.   Assessment & Plan  Acute gastroenteritis Acute illness which is slowly improving Pt education done, zofran administered IM, and also  ordered BRAT diet provided , pt encouraged to push fluids. Stat las ordered to establish patient's status   Essential hypertension Controlled, no change in medication

## 2016-08-23 ENCOUNTER — Ambulatory Visit (HOSPITAL_COMMUNITY)
Admit: 2016-08-23 | Discharge: 2016-08-23 | Disposition: A | Discharge status: Home / Self Care | Payer: PPO | Attending: Emergency Medicine | Admitting: Emergency Medicine

## 2016-08-23 ENCOUNTER — Emergency Department (HOSPITAL_COMMUNITY)
Admission: EM | Admit: 2016-08-23 | Discharge: 2016-08-23 | Disposition: A | Discharge status: Home / Self Care | Payer: PPO | Attending: Emergency Medicine | Admitting: Emergency Medicine

## 2016-08-23 ENCOUNTER — Encounter (HOSPITAL_COMMUNITY): Payer: Self-pay

## 2016-08-23 DIAGNOSIS — I82411 Acute embolism and thrombosis of right femoral vein: Secondary | ICD-10-CM

## 2016-08-23 DIAGNOSIS — I10 Essential (primary) hypertension: Secondary | ICD-10-CM | POA: Diagnosis not present

## 2016-08-23 DIAGNOSIS — I82409 Acute embolism and thrombosis of unspecified deep veins of unspecified lower extremity: Secondary | ICD-10-CM

## 2016-08-23 DIAGNOSIS — Z7982 Long term (current) use of aspirin: Secondary | ICD-10-CM | POA: Insufficient documentation

## 2016-08-23 DIAGNOSIS — R2241 Localized swelling, mass and lump, right lower limb: Secondary | ICD-10-CM | POA: Diagnosis not present

## 2016-08-23 DIAGNOSIS — Z87891 Personal history of nicotine dependence: Secondary | ICD-10-CM | POA: Insufficient documentation

## 2016-08-23 DIAGNOSIS — M79604 Pain in right leg: Secondary | ICD-10-CM | POA: Insufficient documentation

## 2016-08-23 DIAGNOSIS — Z79899 Other long term (current) drug therapy: Secondary | ICD-10-CM | POA: Insufficient documentation

## 2016-08-23 DIAGNOSIS — M7989 Other specified soft tissue disorders: Secondary | ICD-10-CM | POA: Insufficient documentation

## 2016-08-23 HISTORY — DX: Acute embolism and thrombosis of unspecified deep veins of unspecified lower extremity: I82.409

## 2016-08-23 MED ORDER — ENOXAPARIN SODIUM 80 MG/0.8ML ~~LOC~~ SOLN
1.0000 mg/kg | Freq: Once | SUBCUTANEOUS | Status: AC
  Administered 2016-08-23: 70 mg via SUBCUTANEOUS
  Filled 2016-08-23: qty 0.8

## 2016-08-23 NOTE — ED Provider Notes (Signed)
Pt returning for [place] of LE. Is positive for DVT. Given written prescription for xarelto starter pack. She is on diclofenac and aspirin. Advised her to stop these and to avoid NSAIDs in general. Bleeding risks reviewed as well as other return precautions. She has no new complaints since ED visit earlier this morning. Needs to FU with her PCP.    Virgel Manifold, MD 08/23/16 1010

## 2016-08-23 NOTE — ED Triage Notes (Signed)
Pt states her right lower leg started swelling about 3 days ago.

## 2016-08-23 NOTE — Discharge Instructions (Signed)
Please return in the morning at 9 AM for an ultrasound of your right leg to rule out a blood clot. Please be here at 8:45 AM.

## 2016-08-23 NOTE — ED Provider Notes (Signed)
TIME SEEN: 2:30 AM  CHIEF COMPLAINT: Right leg swelling, calf pain  HPI: Pt is a 76 y.o. female with history of hypertension, hyperlipidemia who presents to emergency department with right leg swelling, pain that started 2 days ago. No injury that she can remember. Reports pain in the posterior calf and behind the right knee. No numbness, tingling or focal weakness. No history of DVT or PE. Denies fevers, chest pain or shortness of breath. Pain worse with ambulation.  ROS: See HPI Constitutional: no fever  Eyes: no drainage  ENT: no runny nose   Cardiovascular:  no chest pain  Resp: no SOB  GI: no vomiting GU: no dysuria Integumentary: no rash  Allergy: no hives  Musculoskeletal: no leg swelling  Neurological: no slurred speech ROS otherwise negative  PAST MEDICAL HISTORY/PAST SURGICAL HISTORY:  Past Medical History:  Diagnosis Date  . Dry eyes 2010   on restasis - Dr. Charise Killian   . Gout 2009  . Hyperlipidemia   . Hypertension   . Spinal stenosis     MEDICATIONS:  Prior to Admission medications   Medication Sig Start Date End Date Taking? Authorizing Provider  allopurinol (ZYLOPRIM) 100 MG tablet Take 1 tablet (100 mg total) by mouth daily. 07/02/16   Kerri Perches, MD  amLODipine (NORVASC) 10 MG tablet TAKE 1 TABLET BY MOUTH EVERY DAY 05/17/16   Kerri Perches, MD  aspirin (ASPIRIN LOW DOSE) 81 MG EC tablet Take 81 mg by mouth daily.      Historical Provider, MD  Calcium Carb-Cholecalciferol (CALCIUM 600/VITAMIN D3) 600-800 MG-UNIT TABS Take 1 tablet by mouth daily.    Historical Provider, MD  cycloSPORINE (RESTASIS) 0.05 % ophthalmic emulsion Place 1 drop into both eyes 2 (two) times daily.      Historical Provider, MD  diclofenac (CATAFLAM) 50 MG tablet Take 1 tablet (50 mg total) by mouth 3 (three) times daily. 06/16/16   Kerri Perches, MD  Flaxseed, Linseed, (FLAX SEED OIL) 1000 MG CAPS Take by mouth daily.      Historical Provider, MD  gabapentin (NEURONTIN)  300 MG capsule TAKE 1 CAPSULE BY MOUTH EVERY NIGHT AT BEDTIME 08/03/16   Kerri Perches, MD  methocarbamol (ROBAXIN) 500 MG tablet Take 1 tablet (500 mg total) by mouth at bedtime as needed for muscle spasms. 03/01/16   Kerri Perches, MD  Na Sulfate-K Sulfate-Mg Sulf (SUPREP BOWEL PREP KIT) 17.5-3.13-1.6 GM/180ML SOLN Take 1 kit by mouth as directed. 05/06/16   Corbin Ade, MD  Omega-3 Fatty Acids (FISH OIL) 1000 MG CAPS Take by mouth daily.      Historical Provider, MD  ondansetron (ZOFRAN) 4 MG tablet Take 1 tablet (4 mg total) by mouth every 8 (eight) hours as needed for nausea or vomiting. 08/17/16   Kerri Perches, MD  spironolactone (ALDACTONE) 50 MG tablet TAKE 1 TABLET(50 MG) BY MOUTH DAILY 02/17/16   Kerri Perches, MD    ALLERGIES:  Allergies  Allergen Reactions  . Benazepril Other (See Comments)    Patient does not recall being on this medication and/or what reaction she might have had.  . Metoprolol     CRAMPS    SOCIAL HISTORY:  Social History  Substance Use Topics  . Smoking status: Former Smoker    Quit date: 10/07/1983  . Smokeless tobacco: Never Used  . Alcohol use No    FAMILY HISTORY: Family History  Problem Relation Age of Onset  . Stroke Mother   .  Stroke Father   . Diabetes Sister   . Pulmonary embolism Sister   . Colon cancer Neg Hx     EXAM: BP 133/83 (BP Location: Left Arm)   Pulse 84   Resp 15   Ht '5\' 3"'$  (1.6 m)   Wt 154 lb (69.9 kg)   SpO2 98%   BMI 27.28 kg/m  CONSTITUTIONAL: Alert and oriented and responds appropriately to questions. Well-appearing; well-nourished HEAD: Normocephalic EYES: Conjunctivae clear, PERRL, EOMI ENT: normal nose; no rhinorrhea; moist mucous membranes NECK: Supple, no meningismus, no nuchal rigidity, no LAD  CARD: RRR; S1 and S2 appreciated; no murmurs, no clicks, no rubs, no gallops RESP: Normal chest excursion without splinting or tachypnea; breath sounds clear and equal bilaterally; no wheezes,  no rhonchi, no rales, no hypoxia or respiratory distress, speaking full sentences ABD/GI: Normal bowel sounds; non-distended; soft, non-tender, no rebound, no guarding, no peritoneal signs, no hepatosplenomegaly BACK:  The back appears normal and is non-tender to palpation, there is no CVA tenderness EXT: Normal ROM in all joints no edema; normal capillary refill; no cyanosis, Patient has swollen from the right knee down to her right side with no pitting edema. She does have right-sided calf tenderness on exam without ecchymosis. Compartments are soft. No joint effusion. Otherwise extremities are nontender to palpation. She is able to ambulate but does have a slightly limping gait secondary to pain. No bony deformity appreciated. 2+ DP pulses diffusely. Normal sensation throughout the right leg. SKIN: Normal color for age and race; warm; no rash NEURO: Moves all extremities equally, sensation to light touch intact diffusely, cranial nerves II through XII intact, normal speech PSYCH: The patient's mood and manner are appropriate. Grooming and personal hygiene are appropriate.  MEDICAL DECISION MAKING: Patient here in the right lower extremity swelling. Nothing to suggest infection. Neurovascular intact distally. Extremities are warm and well perfused. No signs of compartment syndrome. No history of injury to suggest fracture. Concern for possible DVT. We will have her return in the morning at 9 AM for a venous ultrasound. We'll give a dose of therapeutic Lovenox. She denies any chest pain or shortness of breath. Is hemodynamically stable, well appearing. His current pain medication which she declines.   At this time, I do not feel there is any life-threatening condition present. I have reviewed and discussed all results (EKG, imaging, lab, urine as appropriate) and exam findings with patient/family. I have reviewed nursing notes and appropriate previous records.  I feel the patient is safe to be discharged  home without further emergent workup and can continue workup as an outpatient as needed. Discussed usual and customary return precautions. Patient/family verbalize understanding and are comfortable with this plan.  Outpatient follow-up has been provided. All questions have been answered.    [place], DO 08/23/16 [phone]

## 2016-08-24 ENCOUNTER — Telehealth: Payer: Self-pay

## 2016-08-24 NOTE — Telephone Encounter (Signed)
Attempted to reach patient multiple times on both listed numbers.   No voicemail on either number.     Will try again.

## 2016-08-27 ENCOUNTER — Other Ambulatory Visit: Payer: Self-pay | Admitting: Family Medicine

## 2016-09-02 ENCOUNTER — Encounter: Payer: Self-pay | Admitting: Family Medicine

## 2016-09-02 ENCOUNTER — Ambulatory Visit (INDEPENDENT_AMBULATORY_CARE_PROVIDER_SITE_OTHER): Payer: PPO | Admitting: Family Medicine

## 2016-09-02 VITALS — BP 140/80 | HR 117 | Resp 16

## 2016-09-02 DIAGNOSIS — M62838 Other muscle spasm: Secondary | ICD-10-CM | POA: Diagnosis not present

## 2016-09-02 DIAGNOSIS — I824Z1 Acute embolism and thrombosis of unspecified deep veins of right distal lower extremity: Secondary | ICD-10-CM

## 2016-09-02 DIAGNOSIS — I10 Essential (primary) hypertension: Secondary | ICD-10-CM

## 2016-09-02 DIAGNOSIS — Z09 Encounter for follow-up examination after completed treatment for conditions other than malignant neoplasm: Secondary | ICD-10-CM | POA: Diagnosis not present

## 2016-09-02 MED ORDER — METHOCARBAMOL 500 MG PO TABS: ORAL_TABLET | ORAL | 0 refills | Status: DC

## 2016-09-02 NOTE — Patient Instructions (Signed)
F/u in 1 month, call if you need me sooner  Muscle relaxant to be taken two times daily for 1 week, call if no better.  Remain on blood thinner for clot in leg

## 2016-09-03 ENCOUNTER — Telehealth: Payer: Self-pay | Admitting: Family Medicine

## 2016-09-03 DIAGNOSIS — I824Z1 Acute embolism and thrombosis of unspecified deep veins of right distal lower extremity: Secondary | ICD-10-CM | POA: Insufficient documentation

## 2016-09-03 DIAGNOSIS — M62838 Other muscle spasm: Secondary | ICD-10-CM | POA: Insufficient documentation

## 2016-09-03 NOTE — Assessment & Plan Note (Addendum)
severe left neck spasm Robaxin 500 mgf twice daily for 1 week  If no response or unable to tolerate medication will need ED eval Encouraged pt to call family member to get her home , she again refused stating son was working , and that she did not have a long way to drive home

## 2016-09-03 NOTE — Assessment & Plan Note (Signed)
Diagnosed on 08/23/2016 with acute right DVT Records reviewed and pts treatment plan reviewed with her and plan is to treat for 3 months as this is the first episode and I judge her not to be high risk fo recurrence

## 2016-09-03 NOTE — Assessment & Plan Note (Signed)
Controlled, no change in medication  

## 2016-09-03 NOTE — Assessment & Plan Note (Signed)
Currently on xarelto, since distal and first episode , will treat for 3 months

## 2016-09-03 NOTE — Telephone Encounter (Signed)
Direct call made to enquire how pt was doing. She reported that she fell while getting in/out of bed this morning, her 'bad leg" gave out on her  Inquired about neck, still stiff and she "hurts all over" I spoke directly with pt and her son, and advised ED evaluation as she has fallen, is on blood thinner and is weak. Explained need for 24 hr supervision until she is significantly improved, at least 3 to 5 days.and that short term placement is sNF would be benficial Both stated they understood my concern, Pt was appreciative of call Pt currently choosing to stay at home and her son and his niece are to provide supervision. I again re iterated my recommendation and advised she go directly to ED for recurrent fall, or if she and her family changed current decision

## 2016-09-03 NOTE — Telephone Encounter (Signed)
Pls call pharmacy, let me know when xarelto starter kit dispensed will end, pt will needs to start once daily xarelto prescribed by me for 3 months after she finishes the starter kit. I have entered historically, but please get back to me with the info, I will complete script with approproriate start date  Also pLEASE dIRECTLY cALL patient , ensure she understands nOT to take aspirin while on the xarelto  ?? pls ask

## 2016-09-03 NOTE — Progress Notes (Signed)
   Kristen Cox     MRN: [id number]      DOB: 11-18-1940   HPI Kristen Cox is here for follow up from recent ED visit on 08/23/2016 when she was diagnosed with acute right DVT.  Still has significant right calf pain and swelling C/o generalized body pain, poor appetite and feeling weak. States unable to sleep for past 3 to 4 days, c./o stiffness in neck, initially the right side , now the left, barely able to turn her head. Denies fever , chills or respiratory symptoms  denies shortness of breath, cough or bloody sputum   ROS Denies recent fever or chills. Denies sinus pressure, nasal congestion, ear pain or sore throat. Denies chest congestion, productive cough or wheezing. Denies chest pains, palpitations and leg swelling Denies abdominal pain, nausea, vomiting,diarrhea or constipation.   Denies dysuria, frequency, hesitancy or incontinence.   PE  BP 140/80   Pulse (!) 117   Resp 16   SpO2 96%   Ill appearing elederly female, markedly reduced ROM neck with left trapezius spasm  Patient alert and oriented and in no cardiopulmonary distress.  HEENT: No facial asymmetry, EOMI,   oropharynx pink and moist.  Neck decreased ROM, no  JVD, no mass.  Chest: Clear to auscultation bilaterally.  CVS: S1, S2 no murmurs, no S3.Regular rate.  ABD: Soft non tender.   Ext: redness, warmth and swelling of right calf, tender to palpation Positive calf tenderness, positive doppler report from recent ED visit reviewed  [id number] Adequate ROM spine, shoulders, hips and knees.  Skin: Intact, no ulcerations or rash noted.  Psych: Good eye contact, normal affect. Memory intact not anxious or depressed appearing.  CNS: CN 2-12 intact, power,  normal throughout.no focal deficits noted.   Assessment & Plan DVT, lower extremity, distal, acute, right (HCC) Currently on xarelto, since distal and first episode , will treat for 3 months  Essential hypertension Controlled, no change in  medication   Neck muscle spasm severe left neck spasm Robaxin 500 mgf twice daily for 1 week  If no response or unable to tolerate medication will need ED eval Encouraged pt to call family member to get her home , she again refused stating son was working , and that she did not have a long way to drive home  Encounter for examination following treatment at hospital Diagnosed on 08/23/2016 with acute right DVT Records reviewed and pts treatment plan reviewed with her and plan is to treat for 3 months as this is the first episode and I judge her not to be high risk fo recurrence

## 2016-09-10 ENCOUNTER — Other Ambulatory Visit: Payer: Self-pay

## 2016-09-10 MED ORDER — RIVAROXABAN 20 MG PO TABS: 20.0000 mg | ORAL_TABLET | Freq: Every day | ORAL | 2 refills | Status: DC

## 2016-09-10 NOTE — Telephone Encounter (Signed)
Patient aware of what to do when starter pack ends.

## 2016-09-18 ENCOUNTER — Other Ambulatory Visit: Payer: Self-pay | Admitting: Family Medicine

## 2016-10-05 ENCOUNTER — Encounter: Payer: Self-pay | Admitting: Family Medicine

## 2016-10-05 ENCOUNTER — Ambulatory Visit (INDEPENDENT_AMBULATORY_CARE_PROVIDER_SITE_OTHER): Payer: PPO | Admitting: Family Medicine

## 2016-10-05 DIAGNOSIS — I10 Essential (primary) hypertension: Secondary | ICD-10-CM | POA: Diagnosis not present

## 2016-10-05 DIAGNOSIS — I82411 Acute embolism and thrombosis of right femoral vein: Secondary | ICD-10-CM | POA: Diagnosis not present

## 2016-10-05 DIAGNOSIS — M62838 Other muscle spasm: Secondary | ICD-10-CM | POA: Diagnosis not present

## 2016-10-05 MED ORDER — RIVAROXABAN 20 MG PO TABS: 20.0000 mg | ORAL_TABLET | Freq: Every day | ORAL | 3 refills | Status: DC

## 2016-10-05 NOTE — Patient Instructions (Addendum)
Wellness visit June 28 or after, [facility]    Physical exam in June as [facility]  Take Xarelto 20 mg one daily up to April, ends during the month of April  No restrictions in activities, careul no cuts, report any bleeding , stool or urine  Thankful better

## 2016-10-07 ENCOUNTER — Other Ambulatory Visit: Payer: Self-pay | Admitting: Family Medicine

## 2016-10-07 ENCOUNTER — Encounter: Payer: Self-pay | Admitting: Family Medicine

## 2016-10-07 ENCOUNTER — Telehealth: Payer: Self-pay | Admitting: Family Medicine

## 2016-10-07 DIAGNOSIS — I82411 Acute embolism and thrombosis of right femoral vein: Secondary | ICD-10-CM

## 2016-10-07 NOTE — Progress Notes (Signed)
   Kristen Cox     MRN: [id number]      DOB: 1941/06/17   HPI Kristen Cox is here for follow up and re-evaluation of chronic medical conditions, medication management and review of any available recent lab and radiology data.  Preventive health is updated, specifically  Cancer screening and Immunization.   Questions or concerns regarding consultations or procedures which the PT has had in the interim are  addressed. The PT denies any adverse reactions to current medications since the last visit.  There are no new concerns.  There are no specific complaints   ROS Denies recent fever or chills. Denies sinus pressure, nasal congestion, ear pain or sore throat. Denies chest congestion, productive cough or wheezing. Denies chest pains, palpitations and leg swelling Denies abdominal pain, nausea, vomiting,diarrhea or constipation.   Denies dysuria, frequency, hesitancy or incontinence. Denies joint pain, swelling and limitation in mobility. Denies headaches, seizures, numbness, or tingling. Denies depression, anxiety or insomnia. Denies skin break down or rash.   PE  BP 130/80   Pulse 97   Resp 15   Ht 5\' 3"  (1.6 m)   Wt 154 lb (69.9 kg)   SpO2 97%   BMI 27.28 kg/m   Patient alert and oriented and in no cardiopulmonary distress.  HEENT: No facial asymmetry, EOMI,   oropharynx pink and moist.  Neck supple no JVD, no mass.  Chest: Clear to auscultation bilaterally.  CVS: S1, S2 no murmurs, no S3.Regular rate.  ABD: Soft non tender.   Ext: No edema Right calf mildly swollen , non tender  MS: Adequate though reduced ROM spine, normal in  shoulders, hips and knees.  Skin: Intact, no ulcerations or rash noted.  Psych: Good eye contact, normal affect. Memory intact not anxious or depressed appearing.  CNS: CN 2-12 intact, power,  normal throughout.no focal deficits noted.   Assessment & Plan  Essential hypertension Controlled, no change in medication DASH diet and  commitment to daily physical activity for a minimum of 30 minutes discussed and encouraged, as a part of hypertension management. The importance of attaining a healthy weight is also discussed.  BP/Weight 10/05/2016 09/02/2016 08/23/2016 08/17/2016 07/08/2016 06/16/2016 Q000111Q  Systolic BP [id number]  Diastolic BP 80 80 83 82 82 74 61  Wt. (Lbs) 154 - 154 154 158 157 -  BMI 27.28 - 27.28 26.43 27.55 27.38 -       DVT, lower extremity, distal, acute, right (HCC) Improved pain and swelling of right calf, script to cover xarelto until end April sent to pharmacy and pt educated re need to complete entire course of treatment Denies hematuria of rectal bleeding  Neck muscle spasm Resolved , with full ROM neck , no limitation by pain

## 2016-10-07 NOTE — Assessment & Plan Note (Signed)
Controlled, no change in medication DASH diet and commitment to daily physical activity for a minimum of 30 minutes discussed and encouraged, as a part of hypertension management. The importance of attaining a healthy weight is also discussed.  BP/Weight 10/05/2016 09/02/2016 08/23/2016 08/17/2016 07/08/2016 06/16/2016 Q000111Q  Systolic BP [id number]  Diastolic BP 80 80 83 82 82 74 61  Wt. (Lbs) 154 - 154 154 158 157 -  BMI 27.28 - 27.28 26.43 27.55 27.38 -

## 2016-10-07 NOTE — Assessment & Plan Note (Signed)
Improved pain and swelling of right calf, script to cover xarelto until end April sent to pharmacy and pt educated re need to complete entire course of treatment Denies hematuria of rectal bleeding

## 2016-10-07 NOTE — Telephone Encounter (Signed)
Pls contact pt and let her know that I have ordered a follow up ultrasound of her right leg to see that the clot has resolved (been fully treated) to be done in MAY , after she finishes taking the medication for it (xaralto) Pls give her the date and time for the ultrasound, referral entered and date requested is May 15.  ?? pls ask

## 2016-10-07 NOTE — Telephone Encounter (Signed)
Noted  

## 2016-10-07 NOTE — Assessment & Plan Note (Signed)
Resolved , with full ROM neck , no limitation by pain

## 2016-10-29 ENCOUNTER — Other Ambulatory Visit: Payer: Self-pay | Admitting: Family Medicine

## 2016-11-01 ENCOUNTER — Ambulatory Visit: Payer: PPO

## 2016-12-14 ENCOUNTER — Encounter: Payer: PPO | Admitting: Family Medicine

## 2016-12-16 ENCOUNTER — Other Ambulatory Visit: Payer: Self-pay | Admitting: Family Medicine

## 2016-12-16 DIAGNOSIS — Z1231 Encounter for screening mammogram for malignant neoplasm of breast: Secondary | ICD-10-CM

## 2016-12-20 ENCOUNTER — Ambulatory Visit (HOSPITAL_COMMUNITY)
Admission: RE | Admit: 2016-12-20 | Discharge: 2016-12-20 | Disposition: A | Discharge status: Home / Self Care | Payer: PPO | Source: Ambulatory Visit | Attending: Family Medicine | Admitting: Family Medicine

## 2016-12-20 DIAGNOSIS — Z1231 Encounter for screening mammogram for malignant neoplasm of breast: Secondary | ICD-10-CM

## 2017-01-06 ENCOUNTER — Other Ambulatory Visit: Payer: Self-pay | Admitting: Family Medicine

## 2017-01-21 ENCOUNTER — Other Ambulatory Visit: Payer: Self-pay | Admitting: Family Medicine

## 2017-01-24 ENCOUNTER — Other Ambulatory Visit: Payer: Self-pay | Admitting: Family Medicine

## 2017-01-24 DIAGNOSIS — M5416 Radiculopathy, lumbar region: Secondary | ICD-10-CM

## 2017-02-03 ENCOUNTER — Other Ambulatory Visit: Payer: Self-pay | Admitting: Family Medicine

## 2017-02-07 ENCOUNTER — Other Ambulatory Visit: Payer: Self-pay

## 2017-02-07 MED ORDER — SPIRONOLACTONE 50 MG PO TABS: ORAL_TABLET | ORAL | 1 refills | Status: DC

## 2017-02-09 ENCOUNTER — Other Ambulatory Visit: Payer: Self-pay | Admitting: Family Medicine

## 2017-02-14 ENCOUNTER — Ambulatory Visit (INDEPENDENT_AMBULATORY_CARE_PROVIDER_SITE_OTHER): Payer: PPO

## 2017-02-14 VITALS — BP 118/68 | HR 68 | Temp 98.6°F | Ht 63.0 in | Wt 159.0 lb

## 2017-02-14 DIAGNOSIS — Z0000 Encounter for general adult medical examination without abnormal findings: Secondary | ICD-10-CM | POA: Diagnosis not present

## 2017-02-14 NOTE — Patient Instructions (Addendum)
Kristen Cox , Thank you for taking time to come for your Medicare Wellness Visit. I appreciate your ongoing commitment to your health goals. Please review the following plan we discussed and let me know if I can assist you in the future.   Screening recommendations/referrals: Colonoscopy: Up to date, and no longer required Mammogram: Up to date, next due 11/2017 Bone Density: Up to date Recommended yearly ophthalmology/optometry visit for glaucoma screening and checkup Recommended yearly dental visit for hygiene and checkup  Vaccinations: Influenza vaccine: Up to date, next due 06/2017 Pneumococcal vaccine: Up to date Tdap vaccine: Up to date, next due 01/2021 Shingles vaccine: Due, declines  Advanced directives: Advance directive discussed with you today. I have provided a copy for you to complete at home and have notarized. Once this is complete please bring a copy in to our office so we can scan it into your chart.  Conditions/risks identified: Pre-obese, recommend starting a routine exercise program at least 3 days a week for 30-45 minutes at a time as tolerated.   Next appointment: Follow up with Dr. Moshe Cipro on 02/22/2017 at 9:40 am. Follow up in 1 year for your annual wellness visit.  Preventive Care 50 Years and Older, Female Preventive care refers to lifestyle choices and visits with your health care provider that can promote health and wellness. What does preventive care include?  A yearly physical exam. This is also called an annual well check.  Dental exams once or twice a year.  Routine eye exams. Ask your health care provider how often you should have your eyes checked.  Personal lifestyle choices, including:  Daily care of your teeth and gums.  Regular physical activity.  Eating a healthy diet.  Avoiding tobacco and drug use.  Limiting alcohol use.  Practicing safe sex.  Taking low-dose aspirin every day.  Taking vitamin and mineral supplements as recommended  by your health care provider. What happens during an annual well check? The services and screenings done by your health care provider during your annual well check will depend on your age, overall health, lifestyle risk factors, and family history of disease. Counseling  Your health care provider may ask you questions about your:  Alcohol use.  Tobacco use.  Drug use.  Emotional well-being.  Home and relationship well-being.  Sexual activity.  Eating habits.  History of falls.  Memory and ability to understand (cognition).  Work and work Statistician.  Reproductive health. Screening  You may have the following tests or measurements:  Height, weight, and BMI.  Blood pressure.  Lipid and cholesterol levels. These may be checked every 5 years, or more frequently if you are over 40 years old.  Skin check.  Lung cancer screening. You may have this screening every year starting at age 80 if you have a 30-pack-year history of smoking and currently smoke or have quit within the past 15 years.  Fecal occult blood test (FOBT) of the stool. You may have this test every year starting at age 69.  Flexible sigmoidoscopy or colonoscopy. You may have a sigmoidoscopy every 5 years or a colonoscopy every 10 years starting at age 60.  Hepatitis C blood test.  Hepatitis B blood test.  Sexually transmitted disease (STD) testing.  Diabetes screening. This is done by checking your blood sugar (glucose) after you have not eaten for a while (fasting). You may have this done every 1-3 years.  Bone density scan. This is done to screen for osteoporosis. You may have this done  starting at age 13.  Mammogram. This may be done every 1-2 years. Talk to your health care provider about how often you should have regular mammograms. Talk with your health care provider about your test results, treatment options, and if necessary, the need for more tests. Vaccines  Your health care provider may  recommend certain vaccines, such as:  Influenza vaccine. This is recommended every year.  Tetanus, diphtheria, and acellular pertussis (Tdap, Td) vaccine. You may need a Td booster every 10 years.  Zoster vaccine. You may need this after age 59.  Pneumococcal 13-valent conjugate (PCV13) vaccine. One dose is recommended after age 98.  Pneumococcal polysaccharide (PPSV23) vaccine. One dose is recommended after age 14. Talk to your health care provider about which screenings and vaccines you need and how often you need them. This information is not intended to replace advice given to you by your health care provider. Make sure you discuss any questions you have with your health care provider. Document Released: 09/05/2015 Document Revised: 04/28/2016 Document Reviewed: 06/10/2015 Elsevier Interactive Patient Education  2017 [place] Prevention in the Home Falls can cause injuries. They can happen to people of all ages. There are many things you can do to make your home safe and to help prevent falls. What can I do on the outside of my home?  Regularly fix the edges of walkways and driveways and fix any cracks.  Remove anything that might make you trip as you walk through a door, such as a raised step or threshold.  Trim any bushes or trees on the path to your home.  Use bright outdoor lighting.  Clear any walking paths of anything that might make someone trip, such as rocks or tools.  Regularly check to see if handrails are loose or broken. Make sure that both sides of any steps have handrails.  Any raised decks and porches should have guardrails on the edges.  Have any leaves, snow, or ice cleared regularly.  Use sand or salt on walking paths during winter.  Clean up any spills in your garage right away. This includes oil or grease spills. What can I do in the bathroom?  Use night lights.  Install grab bars by the toilet and in the tub and shower. Do not use towel  bars as grab bars.  Use non-skid mats or decals in the tub or shower.  If you need to sit down in the shower, use a plastic, non-slip stool.  Keep the floor dry. Clean up any water that spills on the floor as soon as it happens.  Remove soap buildup in the tub or shower regularly.  Attach bath mats securely with double-sided non-slip rug tape.  Do not have throw rugs and other things on the floor that can make you trip. What can I do in the bedroom?  Use night lights.  Make sure that you have a light by your bed that is easy to reach.  Do not use any sheets or blankets that are too big for your bed. They should not hang down onto the floor.  Have a firm chair that has side arms. You can use this for support while you get dressed.  Do not have throw rugs and other things on the floor that can make you trip. What can I do in the kitchen?  Clean up any spills right away.  Avoid walking on wet floors.  Keep items that you use a lot in easy-to-reach places.  If you need to reach something above you, use a strong step stool that has a grab bar.  Keep electrical cords out of the way.  Do not use floor polish or wax that makes floors slippery. If you must use wax, use non-skid floor wax.  Do not have throw rugs and other things on the floor that can make you trip. What can I do with my stairs?  Do not leave any items on the stairs.  Make sure that there are handrails on both sides of the stairs and use them. Fix handrails that are broken or loose. Make sure that handrails are as long as the stairways.  Check any carpeting to make sure that it is firmly attached to the stairs. Fix any carpet that is loose or worn.  Avoid having throw rugs at the top or bottom of the stairs. If you do have throw rugs, attach them to the floor with carpet tape.  Make sure that you have a light switch at the top of the stairs and the bottom of the stairs. If you do not have them, ask someone to  add them for you. What else can I do to help prevent falls?  Wear shoes that:  Do not have high heels.  Have rubber bottoms.  Are comfortable and fit you well.  Are closed at the toe. Do not wear sandals.  If you use a stepladder:  Make sure that it is fully opened. Do not climb a closed stepladder.  Make sure that both sides of the stepladder are locked into place.  Ask someone to hold it for you, if possible.  Clearly mark and make sure that you can see:  Any grab bars or handrails.  First and last steps.  Where the edge of each step is.  Use tools that help you move around (mobility aids) if they are needed. These include:  Canes.  Walkers.  Scooters.  Crutches.  Turn on the lights when you go into a dark area. Replace any light bulbs as soon as they burn out.  Set up your furniture so you have a clear path. Avoid moving your furniture around.  If any of your floors are uneven, fix them.  If there are any pets around you, be aware of where they are.  Review your medicines with your doctor. Some medicines can make you feel dizzy. This can increase your chance of falling. Ask your doctor what other things that you can do to help prevent falls. This information is not intended to replace advice given to you by your health care provider. Make sure you discuss any questions you have with your health care provider. Document Released: 06/05/2009 Document Revised: 01/15/2016 Document Reviewed: 09/13/2014 Elsevier Interactive Patient Education  2017 [employer].

## 2017-02-14 NOTE — Progress Notes (Signed)
Subjective:   Kristen Cox is a 76 y.o. female who presents for Medicare Annual (Subsequent) preventive examination.  Review of Systems:  Cardiac Risk Factors include: advanced age (>41men, >71 women);dyslipidemia;hypertension;sedentary lifestyle     Objective:     Vitals: BP 118/68   Pulse 68   Temp 98.6 F (37 C) (Oral)   Ht 5\' 3"  (1.6 m)   Wt 159 lb 0.6 oz (72.1 kg)   BMI 28.17 kg/m   Body mass index is 28.17 kg/m.   Tobacco History  Smoking Status  . Former Smoker  . Packs/day: 0.50  . Years: 10.00  . Types: Cigarettes  . Quit date: 10/07/1983  Smokeless Tobacco  . Never Used     Counseling given: Not Answered   Past Medical History:  Diagnosis Date  . Dry eyes 2010   on restasis - Dr. Jorja Loa   . Gout 2009  . Hyperlipidemia   . Hypertension   . Spinal stenosis    Past Surgical History:  Procedure Laterality Date  . CESAREAN SECTION    . COLONOSCOPY  04/09/2009   YIR:SWNI papilla otherwise, normal rectum/Left-sided diverticula/Cecal polyp status post cold snare and removal/The remainder of the colonic mucosa appeared normal. Adenomatous polyps, next TCS 03/2016 per RMR.  . COLONOSCOPY N/A 05/19/2016   Procedure: COLONOSCOPY;  Surgeon: Daneil Dolin, MD;  Location: [facility];  Service: Endoscopy;  Laterality: N/A;  1:00 PM   Family History  Problem Relation Age of Onset  . Stroke Mother   . Stroke Father   . Diabetes Sister   . Pulmonary embolism Sister   . Cirrhosis Brother   . Colon cancer Neg Hx    History  Sexual Activity  . Sexual activity: No    Outpatient Encounter Prescriptions as of 02/14/2017  Medication Sig  . amLODipine (NORVASC) 10 MG tablet TAKE 1 TABLET BY MOUTH EVERY DAY  . Calcium Carb-Cholecalciferol (CALCIUM 600/VITAMIN D3) 600-800 MG-UNIT TABS Take 1 tablet by mouth daily.  . Flaxseed, Linseed, (FLAX SEED OIL) 1000 MG CAPS Take by mouth daily.    Vladimir Faster Glycol-Propyl Glycol (SYSTANE) 0.4-0.3 % SOLN Apply 1 drop  to eye 2 (two) times daily.  Marland Kitchen spironolactone (ALDACTONE) 50 MG tablet TAKE 1 TABLET(50 MG) BY MOUTH DAILY  . [DISCONTINUED] cycloSPORINE (RESTASIS) 0.05 % ophthalmic emulsion Place 1 drop into both eyes 2 (two) times daily.    . [DISCONTINUED] rivaroxaban (XARELTO) 20 MG TABS tablet Take 1 tablet (20 mg total) by mouth daily with supper.   No facility-administered encounter medications on file as of 02/14/2017.     Activities of Daily Living In your present state of health, do you have any difficulty performing the following activities: 02/14/2017 06/16/2016  Hearing? N N  Vision? N N  Difficulty concentrating or making decisions? N N  Walking or climbing stairs? N Y  Dressing or bathing? N N  Doing errands, shopping? N N  Preparing Food and eating ? N -  Using the Toilet? N -  In the past six months, have you accidently leaked urine? N -  Do you have problems with loss of bowel control? N -  Managing your Medications? N -  Managing your Finances? N -  Housekeeping or managing your Housekeeping? N -  Some recent data might be hidden    Patient Care Team: Fayrene Helper, MD as PCP - General Rourk, Cristopher Estimable, MD as Consulting Physician (Gastroenterology) Cassandria Anger, MD as Consulting Physician (Endocrinology)  Florian Buff, MD as Consulting Physician (Obstetrics and Gynecology) Sydnee Cabal, MD as Consulting Physician (Orthopedic Surgery)    Assessment:    Exercise Activities and Dietary recommendations Current Exercise Habits: The patient does not participate in regular exercise at present, Exercise limited by: None identified  Goals    . Exercise 3x per week (30 min per time)          Recommend starting a routine exercise program at least 3 days a week for 30-45 minutes at a time as tolerated.        Fall Risk Fall Risk  02/14/2017 04/19/2016 02/17/2016 11/04/2015 10/17/2015  Falls in the past year? No No No No No  Risk for fall due to : - - - - -  Risk  for fall due to (comments): - - - - -   Depression Screen PHQ 2/9 Scores 02/14/2017 04/19/2016 10/17/2015 11/13/2014  PHQ - 2 Score 0 0 0 3  PHQ- 9 Score - - - 5     Cognitive Function: Normal   6CIT Screen 02/14/2017  What Year? 0 points  What month? 0 points  What time? 0 points  Count back from 20 0 points  Months in reverse 0 points  Repeat phrase 0 points  Total Score 0    Immunization History  Administered Date(s) Administered  . Influenza,inj,Quad PF,36+ Mos 06/12/2014, 08/21/2015, 07/08/2016  . Pneumococcal Conjugate-13 11/13/2014  . Pneumococcal Polysaccharide-23 02/17/2016  . Tdap 02/01/2011   Screening Tests Health Maintenance  Topic Date Due  . INFLUENZA VACCINE  03/23/2017  . TETANUS/TDAP  01/31/2021  . COLONOSCOPY  05/19/2026  . DEXA SCAN  Completed  . PNA vac Low Risk Adult  Completed      Plan:   I have personally reviewed and noted the following in the patient's chart:   . Medical and social history . Use of alcohol, tobacco or illicit drugs  . Current medications and supplements . Functional ability and status . Nutritional status . Physical activity . Advanced directives . List of other physicians . Hospitalizations, surgeries, and ER visits in previous 12 months . Vitals . Screenings to include cognitive, depression, and falls . Referrals and appointments: none  In addition, I have reviewed and discussed with patient certain preventive protocols, quality metrics, and best practice recommendations. A written personalized care plan for preventive services as well as general preventive health recommendations were provided to patient.     Stormy Fabian, LPN  2/77/4128

## 2017-02-22 ENCOUNTER — Ambulatory Visit (INDEPENDENT_AMBULATORY_CARE_PROVIDER_SITE_OTHER): Payer: PPO | Admitting: Family Medicine

## 2017-02-22 ENCOUNTER — Encounter: Payer: Self-pay | Admitting: Family Medicine

## 2017-02-22 VITALS — BP 120/84 | HR 96 | Temp 97.5°F | Ht 63.0 in | Wt 159.0 lb

## 2017-02-22 DIAGNOSIS — Z0000 Encounter for general adult medical examination without abnormal findings: Secondary | ICD-10-CM

## 2017-02-22 LAB — HEMOCCULT GUIAC POC 1CARD (OFFICE): Fecal Occult Blood, POC: NEGATIVE

## 2017-02-22 NOTE — Patient Instructions (Addendum)
F/u in 4 month, call if you need me sooner  Excellent exam today.  Fasting lipid, chem 7 Vit D and tSH asap  It is important that you exercise regularly at least 30 minutes 5 times a week. If you develop chest pain, have severe difficulty breathing, or feel very tired, stop exercising immediately and seek medical attention  No medication change  Fall Prevention in the Home Falls can cause injuries. They can happen to people of all ages. There are many things you can do to make your home safe and to help prevent falls. What can I do on the outside of my home?  Regularly fix the edges of walkways and driveways and fix any cracks.  Remove anything that might make you trip as you walk through a door, such as a raised step or threshold.  Trim any bushes or trees on the path to your home.  Use bright outdoor lighting.  Clear any walking paths of anything that might make someone trip, such as rocks or tools.  Regularly check to see if handrails are loose or broken. Make sure that both sides of any steps have handrails.  Any raised decks and porches should have guardrails on the edges.  Have any leaves, snow, or ice cleared regularly.  Use sand or salt on walking paths during winter.  Clean up any spills in your garage right away. This includes oil or grease spills. What can I do in the bathroom?  Use night lights.  Install grab bars by the toilet and in the tub and shower. Do not use towel bars as grab bars.  Use non-skid mats or decals in the tub or shower.  If you need to sit down in the shower, use a plastic, non-slip stool.  Keep the floor dry. Clean up any water that spills on the floor as soon as it happens.  Remove soap buildup in the tub or shower regularly.  Attach bath mats securely with double-sided non-slip rug tape.  Do not have throw rugs and other things on the floor that can make you trip. What can I do in the bedroom?  Use night lights.  Make sure that  you have a light by your bed that is easy to reach.  Do not use any sheets or blankets that are too big for your bed. They should not hang down onto the floor.  Have a firm chair that has side arms. You can use this for support while you get dressed.  Do not have throw rugs and other things on the floor that can make you trip. What can I do in the kitchen?  Clean up any spills right away.  Avoid walking on wet floors.  Keep items that you use a lot in easy-to-reach places.  If you need to reach something above you, use a strong step stool that has a grab bar.  Keep electrical cords out of the way.  Do not use floor polish or wax that makes floors slippery. If you must use wax, use non-skid floor wax.  Do not have throw rugs and other things on the floor that can make you trip. What can I do with my stairs?  Do not leave any items on the stairs.  Make sure that there are handrails on both sides of the stairs and use them. Fix handrails that are broken or loose. Make sure that handrails are as long as the stairways.  Check any carpeting to make sure that it  is firmly attached to the stairs. Fix any carpet that is loose or worn.  Avoid having throw rugs at the top or bottom of the stairs. If you do have throw rugs, attach them to the floor with carpet tape.  Make sure that you have a light switch at the top of the stairs and the bottom of the stairs. If you do not have them, ask someone to add them for you. What else can I do to help prevent falls?  Wear shoes that: ? Do not have high heels. ? Have rubber bottoms. ? Are comfortable and fit you well. ? Are closed at the toe. Do not wear sandals.  If you use a stepladder: ? Make sure that it is fully opened. Do not climb a closed stepladder. ? Make sure that both sides of the stepladder are locked into place. ? Ask someone to hold it for you, if possible.  Clearly mark and make sure that you can see: ? Any grab bars or  handrails. ? First and last steps. ? Where the edge of each step is.  Use tools that help you move around (mobility aids) if they are needed. These include: ? Canes. ? Walkers. ? Scooters. ? Crutches.  Turn on the lights when you go into a dark area. Replace any light bulbs as soon as they burn out.  Set up your furniture so you have a clear path. Avoid moving your furniture around.  If any of your floors are uneven, fix them.  If there are any pets around you, be aware of where they are.  Review your medicines with your doctor. Some medicines can make you feel dizzy. This can increase your chance of falling. Ask your doctor what other things that you can do to help prevent falls. This information is not intended to replace advice given to you by your health care provider. Make sure you discuss any questions you have with your health care provider. Document Released: 06/05/2009 Document Revised: 01/15/2016 Document Reviewed: 09/13/2014 Elsevier Interactive Patient Education  [employer].

## 2017-02-26 ENCOUNTER — Encounter: Payer: Self-pay | Admitting: Family Medicine

## 2017-02-26 NOTE — Assessment & Plan Note (Signed)

## 2017-02-26 NOTE — Progress Notes (Signed)
    Kristen Cox     MRN: [id number]      DOB: 02-19-1941  HPI: Patient is in for annual physical exam. No other health concerns are expressed or addressed at the visit. Recent labs, if available are reviewed. Immunization is reviewed , and  updated if needed.   PE: BP 120/84 (BP Location: Left Arm, Patient Position: Sitting)   Pulse 96   Temp 97.5 F (36.4 C)   Wt 159 lb (72.1 kg)   SpO2 97%   BMI 28.17 kg/m   Pleasant  female, alert and oriented x 3, in no cardio-pulmonary distress. Afebrile. HEENT No facial trauma or asymetry. Sinuses non tender.  Extra occullar muscles intact, pupils equally reactive to light. External ears normal, tympanic membranes clear. Oropharynx moist, no exudate. Neck: supple, no adenopathy,JVD or thyromegaly.No bruits.  Chest: Clear to ascultation bilaterally.No crackles or wheezes. Non tender to palpation  Breast: No asymetry,no masses or lumps. No tenderness. No nipple discharge or inversion. No axillary or supraclavicular adenopathy  Cardiovascular system; Heart sounds normal,  S1 and  S2 ,no S3.  No murmur, or thrill. Apical beat not displaced Peripheral pulses normal.  Abdomen: Soft, non tender, no organomegaly or masses. No bruits. Bowel sounds normal. No guarding, tenderness or rebound.  Rectal:  Normal sphincter tone. No rectal mass. Guaiac negative stool.  GU: External genitalia normal female genitalia , normal female distribution of hair. No lesions. Urethral meatus normal in size, no  Prolapse, no lesions visibly  Present. Bladder non tender. Vagina pink and moist , with no visible lesions , discharge present . Adequate pelvic support no  cystocele or rectocele noted Cervix pink and appears healthy, no lesions or ulcerations noted, no discharge noted from os Uterus normal size, no adnexal masses, no cervical motion or adnexal tenderness.   Musculoskeletal exam: Adequate though decreased  ROM of spine, hips ,  shoulders and knees. No deformity ,swelling or crepitus noted. No muscle wasting or atrophy.   Neurologic: Cranial nerves 2 to 12 intact. Power, tone ,sensation and reflexes normal throughout. No disturbance in gait. No tremor.  Skin: Intact, no ulceration, erythema , scaling or rash noted. Pigmentation normal throughout  Psych; Normal mood and affect. Judgement and concentration normal   Assessment & Plan:  No problem-specific Assessment & Plan notes found for this encounter.

## 2017-03-02 ENCOUNTER — Telehealth: Payer: Self-pay | Admitting: Family Medicine

## 2017-03-02 NOTE — Telephone Encounter (Signed)
Will check with dr. (Has been off the xarelto since end of April) [place] to resume aspirin?

## 2017-03-02 NOTE — Telephone Encounter (Signed)
New Message  Pt wants to know if she needs to start back taking aspirin 81mg  due to her previously having blood clots.  Pt voiced it is okay to leave detailed message.  Please f/u

## 2017-03-03 NOTE — Telephone Encounter (Signed)
Patient informed of message below, verbalized understanding.  

## 2017-03-03 NOTE — Telephone Encounter (Signed)
yes

## 2017-04-11 ENCOUNTER — Other Ambulatory Visit: Payer: Self-pay | Admitting: "Endocrinology

## 2017-04-12 LAB — VITAMIN D 25 HYDROXY (VIT D DEFICIENCY, FRACTURES): Vit D, 25-Hydroxy: 38 ng/mL (ref 30–100)

## 2017-04-12 LAB — PTH, INTACT AND CALCIUM
Calcium: 10.2 mg/dL (ref 8.6–10.4)
PTH: 37 pg/mL (ref 14–64)

## 2017-04-21 ENCOUNTER — Encounter: Payer: Self-pay | Admitting: "Endocrinology

## 2017-04-21 ENCOUNTER — Ambulatory Visit (INDEPENDENT_AMBULATORY_CARE_PROVIDER_SITE_OTHER): Payer: PPO | Admitting: "Endocrinology

## 2017-04-21 DIAGNOSIS — N2581 Secondary hyperparathyroidism of renal origin: Secondary | ICD-10-CM | POA: Diagnosis not present

## 2017-04-21 DIAGNOSIS — E782 Mixed hyperlipidemia: Secondary | ICD-10-CM | POA: Diagnosis not present

## 2017-04-21 DIAGNOSIS — E213 Hyperparathyroidism, unspecified: Secondary | ICD-10-CM | POA: Insufficient documentation

## 2017-04-21 NOTE — Progress Notes (Signed)
Subjective:    Patient ID: Kristen Cox, female    DOB: May 07, 1941, PCP Fayrene Helper, MD   Past Medical History:  Diagnosis Date  . Deep vein thrombosis (DVT) (HCC) 08/2016   Right leg  . Dry eyes 2010   on restasis - Dr. Jorja Loa   . Gout 2009  . Hyperlipidemia   . Hypertension   . Spinal stenosis    Past Surgical History:  Procedure Laterality Date  . CESAREAN SECTION    . COLONOSCOPY  04/09/2009   OXB:DZHG papilla otherwise, normal rectum/Left-sided diverticula/Cecal polyp status post cold snare and removal/The remainder of the colonic mucosa appeared normal. Adenomatous polyps, next TCS 03/2016 per RMR.  . COLONOSCOPY N/A 05/19/2016   Procedure: COLONOSCOPY;  Surgeon: Daneil Dolin, MD;  Location: [facility];  Service: Endoscopy;  Laterality: N/A;  1:00 PM   Social History   Social History  . Marital status: Widowed    Spouse name: N/A  . Number of children: 2  . Years of education: N/A   Occupational History  . retired     Social History Main Topics  . Smoking status: Former Smoker    Packs/day: 0.50    Years: 10.00    Types: Cigarettes    Quit date: 10/07/1983  . Smokeless tobacco: Never Used  . Alcohol use No  . Drug use: No  . Sexual activity: No   Other Topics Concern  . Not on file   Social History Narrative  . No narrative on file   Outpatient Encounter Prescriptions as of 04/21/2017  Medication Sig  . aspirin EC 81 MG tablet Take 81 mg by mouth daily.  Marland Kitchen amLODipine (NORVASC) 10 MG tablet TAKE 1 TABLET BY MOUTH EVERY DAY  . Calcium Carb-Cholecalciferol (CALCIUM 600/VITAMIN D3) 600-800 MG-UNIT TABS Take 1 tablet by mouth daily.  . Flaxseed, Linseed, (FLAX SEED OIL) 1000 MG CAPS Take by mouth daily.    Vladimir Faster Glycol-Propyl Glycol (SYSTANE) 0.4-0.3 % SOLN Apply 1 drop to eye 2 (two) times daily.  Marland Kitchen spironolactone (ALDACTONE) 50 MG tablet TAKE 1 TABLET(50 MG) BY MOUTH DAILY   No facility-administered encounter medications on file  as of 04/21/2017.    ALLERGIES: Allergies  Allergen Reactions  . Benazepril Other (See Comments)    Patient does not recall being on this medication and/or what reaction she might have had.  . Metoprolol     CRAMPS   VACCINATION STATUS: Immunization History  Administered Date(s) Administered  . Influenza,inj,Quad PF,6+ Mos 06/12/2014, 08/21/2015, 07/08/2016  . Pneumococcal Conjugate-13 11/13/2014  . Pneumococcal Polysaccharide-23 02/17/2016  . Tdap 02/01/2011    HPI  76 year old female with medical hx as follows. She is being seen in f/u for secondary hyperparathyroidism.  Her current PTH is better at 37, overall improved from 75.3 along with normal cal of 10.2.  Her prior 24 hour urine calcium has been low at 44,  and was WNL at 118 last check. She denies family hx of calcium problem. She denies any hx of CVA, seizures, nephrolithiasis, nor CKD. she denies Osteoporosis. she is a former smoker. she denies any new complaints.  Review of Systems  Constitutional: has steady weight, no subjective hyperthermia/hypothermia Eyes: no blurry vision, no xerophthalmia ENT: no sore throat, no nodules palpated in throat, no dysphagia/odynophagia, no hoarseness Cardiovascular: no CP/SOB/palpitations/leg swelling Respiratory: no cough/SOB Gastrointestinal: no N/V/D/C Musculoskeletal: no muscle/joint aches Skin: no rashes Neurological: no tremors/numbness/tingling/dizziness Psychiatric: no depression/anxiety   Objective:  BP 135/74   Pulse 88   Ht 5\' 3"  (1.6 m)   Wt 158 lb (71.7 kg)   BMI 27.99 kg/m   Wt Readings from Last 3 Encounters:  04/21/17 158 lb (71.7 kg)  02/22/17 159 lb (72.1 kg)  02/14/17 159 lb 0.6 oz (72.1 kg)    Physical Exam Constitutional: slightly overweight, in NAD Eyes: PERRLA, EOMI, no exophthalmos ENT: moist mucous membranes, no thyromegaly, no cervical lymphadenopathy Cardiovascular: RRR, No MRG Respiratory: CTA B Gastrointestinal: abdomen soft, NT,  ND, BS+ Musculoskeletal: no deformities, strength intact in all 4 Skin: moist, warm, no rashes Neurological: no tremor with outstretched hands, DTR normal in all 4  Diabetic Labs (most recent): Lab Results  Component Value Date   HGBA1C 5.6 07/01/2016   HGBA1C 6.1 (H) 10/30/2015   HGBA1C 5.9 (H) 03/19/2015    Lipid Panel     Component Value Date/Time   CHOL 148 02/11/2016 0718   TRIG 97 02/11/2016 0718   HDL 46 02/11/2016 0718   CHOLHDL 3.2 02/11/2016 0718   VLDL 19 02/11/2016 0718   LDLCALC 83 02/11/2016 0718   Recent Results (from the past 2160 hour(s))  POCT occult blood stool     Status: Normal   Collection Time: 02/22/17 11:03 AM  Result Value Ref Range   Fecal Occult Blood, POC Negative Negative   Card #1 Date     Card #2 Fecal Occult Blod, POC     Card #2 Date     Card #3 Fecal Occult Blood, POC     Card #3 Date    PTH, intact and calcium     Status: None   Collection Time: 04/11/17  7:18 AM  Result Value Ref Range   PTH 37 14 - 64 pg/mL   Calcium 10.2 8.6 - 10.4 mg/dL    Comment:   Interpretive Guide:                              Intact PTH               Calcium                              ----------               ------- Normal Parathyroid           Normal                   Normal Hypoparathyroidism           Low or Low Normal        Low Hyperparathyroidism      Primary                 Normal or High           High      Secondary               High                     Normal or Low      Tertiary                High                     High Non-Parathyroid   Hypercalcemia  Low or Low Normal        High   VITAMIN D 25 Hydroxy (Vit-D Deficiency, Fractures)     Status: None   Collection Time: 04/11/17  7:18 AM  Result Value Ref Range   Vit D, 25-Hydroxy 38 30 - 100 ng/mL    Comment: Vitamin D Status           25-OH Vitamin D        Deficiency                <20 ng/mL        Insufficiency         20 - 29 ng/mL        Optimal             > or  = 30 ng/mL   For 25-OH Vitamin D testing on patients on D2-supplementation and patients for whom quantitation of D2 and D3 fractions is required, the QuestAssureD 25-OH VIT D, (D2,D3), LC/MS/MS is recommended: order code [id number] (patients > 2 yrs).     Assessment & Plan:   1. Hypercalcemia Her repeat work up is reviewed. Her repeat PTH is better at 37 , generally improved from 75.3, along with  normal calcium of 10.2, stable.  Her prior repeat 24 hour urine calcium was low at 44, and 118 on 2 separate occasions.   Differentials include early mild hyperparathyroidism, VS FHH. I discussed the potential complications of untreated hyperparathyroidism.  -She will not need intervention now, she may continue cal D 600/800 po daily.  Continue Atenolol 50 mg po qday for HTN.  2. Hyperlipemia Due to cramps , I advised her to hold simvastatin for now. Continue  omega-3 fatty acids . Her last lipid panel from June 2017 showed LDL 83, triglycerides 97. - I advised patient to maintain close follow up with Fayrene Helper, MD for primary care needs. Follow up plan: Return in about 1 year (around 04/21/2018) for follow up with pre-visit labs.  Glade Lloyd, MD Phone: [phone]  Fax: [phone]  This note was partially dictated with voice recognition software. Similar sounding words can be transcribed inadequately or may not  be corrected upon review.  04/21/2017, 12:42 PM

## 2017-05-04 DIAGNOSIS — H2513 Age-related nuclear cataract, bilateral: Secondary | ICD-10-CM | POA: Diagnosis not present

## 2017-05-04 DIAGNOSIS — H4720 Unspecified optic atrophy: Secondary | ICD-10-CM | POA: Diagnosis not present

## 2017-05-10 ENCOUNTER — Other Ambulatory Visit: Payer: Self-pay | Admitting: Family Medicine

## 2017-06-06 ENCOUNTER — Other Ambulatory Visit: Payer: Self-pay | Admitting: Family Medicine

## 2017-06-06 NOTE — Telephone Encounter (Signed)
Seen 7 3 18

## 2017-06-15 DIAGNOSIS — Z0000 Encounter for general adult medical examination without abnormal findings: Secondary | ICD-10-CM | POA: Diagnosis not present

## 2017-06-16 LAB — LIPID PANEL
CHOLESTEROL: 221 mg/dL — AB (ref <200)
HDL: 50 mg/dL — ABNORMAL LOW (ref >50)
LDL CHOLESTEROL (CALC): 149 mg/dL — AB
Non-HDL Cholesterol (Calc): 171 mg/dL (calc) — ABNORMAL HIGH (ref <130)
Total CHOL/HDL Ratio: 4.4 (calc) (ref <5.0)
Triglycerides: 107 mg/dL (ref <150)

## 2017-06-16 LAB — TSH: TSH: 1.18 m[IU]/L (ref 0.40–4.50)

## 2017-06-16 LAB — VITAMIN D 25 HYDROXY (VIT D DEFICIENCY, FRACTURES): VIT D 25 HYDROXY: 39 ng/mL (ref 30–100)

## 2017-06-23 ENCOUNTER — Ambulatory Visit (INDEPENDENT_AMBULATORY_CARE_PROVIDER_SITE_OTHER): Payer: PPO | Admitting: Family Medicine

## 2017-06-23 ENCOUNTER — Ambulatory Visit (HOSPITAL_COMMUNITY)
Admission: RE | Admit: 2017-06-23 | Discharge: 2017-06-23 | Disposition: A | Discharge status: Home / Self Care | Payer: PPO | Source: Ambulatory Visit | Attending: Family Medicine | Admitting: Family Medicine

## 2017-06-23 ENCOUNTER — Encounter: Payer: Self-pay | Admitting: Family Medicine

## 2017-06-23 VITALS — BP 150/82 | HR 100 | Resp 16 | Ht 63.0 in | Wt 157.0 lb

## 2017-06-23 DIAGNOSIS — M25561 Pain in right knee: Secondary | ICD-10-CM | POA: Insufficient documentation

## 2017-06-23 DIAGNOSIS — M79645 Pain in left finger(s): Secondary | ICD-10-CM | POA: Insufficient documentation

## 2017-06-23 DIAGNOSIS — M11261 Other chondrocalcinosis, right knee: Secondary | ICD-10-CM | POA: Diagnosis not present

## 2017-06-23 DIAGNOSIS — Z23 Encounter for immunization: Secondary | ICD-10-CM

## 2017-06-23 DIAGNOSIS — I10 Essential (primary) hypertension: Secondary | ICD-10-CM | POA: Diagnosis not present

## 2017-06-23 DIAGNOSIS — E7800 Pure hypercholesterolemia, unspecified: Secondary | ICD-10-CM

## 2017-06-23 DIAGNOSIS — M179 Osteoarthritis of knee, unspecified: Secondary | ICD-10-CM | POA: Diagnosis not present

## 2017-06-23 DIAGNOSIS — M2341 Loose body in knee, right knee: Secondary | ICD-10-CM | POA: Insufficient documentation

## 2017-06-23 DIAGNOSIS — G8929 Other chronic pain: Secondary | ICD-10-CM | POA: Insufficient documentation

## 2017-06-23 MED ORDER — KETOROLAC TROMETHAMINE 60 MG/2ML IM SOLN
30.0000 mg | Freq: Once | INTRAMUSCULAR | Status: AC
  Administered 2017-06-23: 30 mg via INTRAMUSCULAR

## 2017-06-23 MED ORDER — PREDNISONE 5 MG (21) PO TBPK: 5.0000 mg | ORAL_TABLET | ORAL | 0 refills | Status: DC

## 2017-06-23 MED ORDER — METHYLPREDNISOLONE ACETATE 80 MG/ML IJ SUSP
80.0000 mg | Freq: Once | INTRAMUSCULAR | Status: AC
  Administered 2017-06-23: 80 mg via INTRAMUSCULAR

## 2017-06-23 NOTE — Assessment & Plan Note (Signed)
2 month history, no trauma, needs X ray , toradol 30 mg  And depo medrol also prednisone dose pack

## 2017-06-23 NOTE — Assessment & Plan Note (Signed)
2.5 month h/o  Swelling and pain , no trauma, refer to ortho

## 2017-06-23 NOTE — Patient Instructions (Signed)
F/u in 4 monht, call if youi need me soponer  Flu vaccine today  Injections in office today for right knee pain, torado 30 mg iM and depo medrol 80 mg iM and 6 day course of prednisone is prescribed  Please get X ray of right knee  Use OTC claritin and honey for allergies and cough  Please cut back on fried and fatty foos cholesteoro is high  You are referred to Dr Luna Glasgow re right hand swelling  Thank you  for choosing [facility]. We consider it a privelige to serve you.  Delivering excellent health care in a caring and  compassionate way is our goal.  Partnering with you,  so that together we can achieve this goal is our strategy.

## 2017-06-25 NOTE — Assessment & Plan Note (Signed)
Hyperlipidemia:Low fat diet discussed and encouraged. Needs to reduce fried and fatty foods Lipid Panel  Lab Results  Component Value Date   CHOL 221 (H) 06/15/2017   HDL 50 (L) 06/15/2017   LDLCALC 83 02/11/2016   TRIG 107 06/15/2017   CHOLHDL 4.4 06/15/2017

## 2017-06-25 NOTE — Progress Notes (Signed)
   Kristen Cox     MRN: [id number]      DOB: 1941-06-23   HPI Kristen Cox is here for follow up and re-evaluation of chronic medical conditions, medication management and review of any available recent lab and radiology data.  Preventive health is updated, specifically  Cancer screening and Immunization.   Questions or concerns regarding consultations or procedures which the PT has had in the interim are  addressed. The PT denies any adverse reactions to current medications since the last visit.  2 month h/o right knee pain preventing exercise, has established sciatica down right leg. 2  Month h/o increased pain and swelling of base of left thumb varies in intensity, no trauma  ROS Denies recent fever or chills. Denies sinus pressure, nasal congestion, ear pain or sore throat. Denies chest congestion, productive cough or wheezing. Denies chest pains, palpitations and leg swelling Denies abdominal pain, nausea, vomiting,diarrhea or constipation.   Denies dysuria, frequency, hesitancy or incontinence. Denies headaches, seizures, numbness, or tingling. Denies depression, anxiety or insomnia. Denies skin break down or rash.   PE  BP (!) 150/82   Pulse 100   Resp 16   Ht 5\' 3"  (1.6 m)   Wt 157 lb (71.2 kg)   SpO2 97%   BMI 27.81 kg/m   Patient alert and oriented and in no cardiopulmonary distress.  HEENT: No facial asymmetry, EOMI,   oropharynx pink and moist.  Neck supple no JVD, no mass.  Chest: Clear to auscultation bilaterally.  CVS: S1, S2 no murmurs, no S3.Regular rate.  ABD: Soft non tender.   Ext: No edema  MS: Decreased  ROM lumbar  spine, and right  knee. Swelling and tenderness of base of left thumb Skin: Intact, no ulcerations or rash noted.  Psych: Good eye contact, normal affect. Memory intact not anxious or depressed appearing.  CNS: CN 2-12 intact, power,  normal throughout.no focal deficits noted.   Assessment & Plan  Thumb pain, left 2.5 month  h/o  Swelling and pain , no trauma, refer to ortho  Knee pain, right 2 month history, no trauma, needs X ray , toradol 30 mg  And depo medrol also prednisone dose pack  Essential hypertension Adequate though sub optimal control, no med change DASH diet and commitment to daily physical activity for a minimum of 30 minutes discussed and encouraged, as a part of hypertension management. The importance of attaining a healthy weight is also discussed.  BP/Weight 06/23/2017 04/21/2017 02/22/2017 02/14/2017 10/05/2016 08/31/3233 12/28/3218  Systolic BP 254 270 623 762 831 517 616  Diastolic BP 82 74 84 68 80 80 83  Wt. (Lbs) 157 158 159 159.04 154 - 154  BMI 27.81 27.99 28.17 28.17 27.28 - 27.28       Hyperlipemia Hyperlipidemia:Low fat diet discussed and encouraged. Needs to reduce fried and fatty foods Lipid Panel  Lab Results  Component Value Date   CHOL 221 (H) 06/15/2017   HDL 50 (L) 06/15/2017   LDLCALC 83 02/11/2016   TRIG 107 06/15/2017   CHOLHDL 4.4 06/15/2017

## 2017-06-25 NOTE — Assessment & Plan Note (Signed)
Adequate though sub optimal control, no med change DASH diet and commitment to daily physical activity for a minimum of 30 minutes discussed and encouraged, as a part of hypertension management. The importance of attaining a healthy weight is also discussed.  BP/Weight 06/23/2017 04/21/2017 02/22/2017 02/14/2017 10/05/2016 9/98/3382 5/0/5397  Systolic BP 673 419 379 024 097 353 299  Diastolic BP 82 74 84 68 80 80 83  Wt. (Lbs) 157 158 159 159.04 154 - 154  BMI 27.81 27.99 28.17 28.17 27.28 - 27.28

## 2017-07-01 ENCOUNTER — Encounter: Payer: Self-pay | Admitting: Orthopaedic Surgery

## 2017-07-06 ENCOUNTER — Ambulatory Visit (INDEPENDENT_AMBULATORY_CARE_PROVIDER_SITE_OTHER): Payer: PPO

## 2017-07-06 ENCOUNTER — Encounter: Payer: Self-pay | Admitting: Orthopaedic Surgery

## 2017-07-06 ENCOUNTER — Ambulatory Visit: Payer: PPO | Admitting: Orthopaedic Surgery

## 2017-07-06 VITALS — BP 151/90 | HR 104 | Temp 98.2°F | Ht 62.5 in | Wt 154.0 lb

## 2017-07-06 DIAGNOSIS — M79645 Pain in left finger(s): Secondary | ICD-10-CM

## 2017-07-06 NOTE — Progress Notes (Signed)
Subjective:    Patient ID: Kristen Cox, female    DOB: [birth year], 76 y.o.   MRN: [id number]  HPI She has had pain of the left thumb at the MCP joint for several months.  She has had some swelling at times.  She saw Dr. Moshe Cipro for this.  She is not taking any medicine.  She has used ice, rubs and heat with help.  She has no redness, no trauma.  She is better the last two weeks.  She has no locking, no numbness.   Review of Systems  HENT: Negative for congestion.   Respiratory: Negative for cough and shortness of breath.   Cardiovascular: Negative for chest pain and leg swelling.  Endocrine: Positive for cold intolerance.  Musculoskeletal: Positive for arthralgias.  Allergic/Immunologic: Positive for environmental allergies.   Past Medical History:  Diagnosis Date  . Deep vein thrombosis (DVT) (HCC) 08/2016   Right leg  . Dry eyes 2010   on restasis - Dr. Jorja Loa   . Gout 2009  . Hyperlipidemia   . Hypertension   . Spinal stenosis     Past Surgical History:  Procedure Laterality Date  . CESAREAN SECTION    . COLONOSCOPY  04/09/2009   PNT:IRWE papilla otherwise, normal rectum/Left-sided diverticula/Cecal polyp status post cold snare and removal/The remainder of the colonic mucosa appeared normal. Adenomatous polyps, next TCS 03/2016 per RMR.    Current Outpatient Medications on File Prior to Visit  Medication Sig Dispense Refill  . amLODipine (NORVASC) 10 MG tablet TAKE 1 TABLET BY MOUTH EVERY DAY 90 tablet 1  . aspirin EC 81 MG tablet Take 81 mg by mouth daily.    . Calcium Carb-Cholecalciferol (CALCIUM 600/VITAMIN D3) 600-800 MG-UNIT TABS Take 1 tablet by mouth daily.    . Flaxseed, Linseed, (FLAX SEED OIL) 1000 MG CAPS Take by mouth daily.      Marland Kitchen ibuprofen (ADVIL,MOTRIN) 800 MG tablet TAKE 1 TABLET BY MOUTH WEEKLY IF NEEDED FOR UNCONTROLLED BACK PAIN 30 tablet 0  . Polyethyl Glycol-Propyl Glycol (SYSTANE) 0.4-0.3 % SOLN Apply 1 drop to eye 2 (two) times daily.    .  predniSONE (STERAPRED UNI-PAK 21 TAB) 5 MG (21) TBPK tablet Take 1 tablet (5 mg total) by mouth as directed. Use as directed 21 tablet 0  . spironolactone (ALDACTONE) 50 MG tablet TAKE 1 TABLET(50 MG) BY MOUTH DAILY 90 tablet 1   No current facility-administered medications on file prior to visit.     Social History   Socioeconomic History  . Marital status: Widowed    Spouse name: Not on file  . Number of children: 2  . Years of education: Not on file  . Highest education level: Not on file  Social Needs  . Financial resource strain: Not on file  . Food insecurity - worry: Not on file  . Food insecurity - inability: Not on file  . Transportation needs - medical: Not on file  . Transportation needs - non-medical: Not on file  Occupational History  . Occupation: retired   Tobacco Use  . Smoking status: Former Smoker    Packs/day: 0.50    Years: 10.00    Pack years: 5.00    Types: Cigarettes    Last attempt to quit: 10/07/1983    Years since quitting: 33.7  . Smokeless tobacco: Never Used  Substance and Sexual Activity  . Alcohol use: No  . Drug use: No  . Sexual activity: No  Other Topics Concern  .  Not on file  Social History Narrative  . Not on file    Family History  Problem Relation Age of Onset  . Stroke Mother   . Stroke Father   . Diabetes Sister   . Pulmonary embolism Sister   . Cirrhosis Brother   . Colon cancer Neg Hx     BP (!) 151/90   Pulse (!) 104   Temp 98.2 F (36.8 C)   Ht 5' 2.5" (1.588 m)   Wt 154 lb (69.9 kg)   BMI 27.72 kg/m       Objective:   Physical Exam  Constitutional: She is oriented to person, place, and time. She appears well-developed and well-nourished.  HENT:  Head: Normocephalic and atraumatic.  Eyes: Conjunctivae and EOM are normal. Pupils are equal, round, and reactive to light.  Neck: Normal range of motion. Neck supple.  Cardiovascular: Normal rate, regular rhythm and intact distal pulses.  Pulmonary/Chest:  Effort normal.  Abdominal: Soft.  Musculoskeletal: She exhibits tenderness (Left thumb has full ROM, no pain, no swelling, no ganglion present.  NV intact.  Right thumb negative as well.  Grips normal.).  Neurological: She is alert and oriented to person, place, and time. She displays normal reflexes. No cranial nerve deficit. She exhibits normal muscle tone. Coordination normal.  Skin: Skin is warm and dry.  Psychiatric: She has a normal mood and affect. Her behavior is normal. Judgment and thought content normal.  Vitals reviewed.    X-rays of the left thumb were done, reported separately.  Negative.     Assessment & Plan:   Encounter Diagnosis  Name Primary?  . Thumb pain, left Yes   I have recommended she use Aspercreme to the area several times a day as needed.  If the cyst should reappear or pain worse, contact me.  I will see prn.  Call if any problem.  Precautions discussed.   Electronically [place], MD 11/14/20181:56 PM

## 2017-07-28 ENCOUNTER — Other Ambulatory Visit: Payer: Self-pay | Admitting: Family Medicine

## 2017-07-28 NOTE — Telephone Encounter (Signed)
Seen 09/02/16

## 2017-08-28 ENCOUNTER — Other Ambulatory Visit: Payer: Self-pay | Admitting: Family Medicine

## 2017-08-28 DIAGNOSIS — M1711 Unilateral primary osteoarthritis, right knee: Secondary | ICD-10-CM

## 2017-09-01 ENCOUNTER — Ambulatory Visit: Payer: Self-pay | Admitting: Orthopaedic Surgery

## 2017-09-06 ENCOUNTER — Ambulatory Visit: Payer: PPO | Admitting: Orthopaedic Surgery

## 2017-09-06 ENCOUNTER — Encounter: Payer: Self-pay | Admitting: Orthopaedic Surgery

## 2017-09-06 ENCOUNTER — Ambulatory Visit (INDEPENDENT_AMBULATORY_CARE_PROVIDER_SITE_OTHER): Payer: PPO

## 2017-09-06 VITALS — BP 134/80 | HR 108 | Ht 62.5 in | Wt 156.0 lb

## 2017-09-06 DIAGNOSIS — M25561 Pain in right knee: Secondary | ICD-10-CM | POA: Diagnosis not present

## 2017-09-06 DIAGNOSIS — G8929 Other chronic pain: Secondary | ICD-10-CM

## 2017-09-06 DIAGNOSIS — M25562 Pain in left knee: Secondary | ICD-10-CM

## 2017-09-06 MED ORDER — NAPROXEN 500 MG PO TABS: 500.0000 mg | ORAL_TABLET | Freq: Two times a day (BID) | ORAL | 5 refills | Status: DC

## 2017-09-06 NOTE — Progress Notes (Signed)
Patient UM:Kristen Cox, female DOB:1941-06-20, 77 y.o. ERX:[id number]  Chief Complaint  Patient presents with  . Knee Pain    right    HPI  Kristen Cox is a 77 y.o. female who has developed pain in both knees, more painful on the left today.  She was seen in November by another physician and had x-rays of the right knee showing tricompartmental changes.  She has now developed pain of the left knee with swelling, popping but no giving way, no redness.  Nothing seems to help. She has tried ice, heat, rubs and Tylenol.  She has no trauma.  HPI  Body mass index is 28.08 kg/m.  ROS  Review of Systems  HENT: Negative for congestion.   Respiratory: Negative for cough and shortness of breath.   Cardiovascular: Negative for chest pain and leg swelling.  Endocrine: Positive for cold intolerance.  Musculoskeletal: Positive for arthralgias.  Allergic/Immunologic: Positive for environmental allergies.    Past Medical History:  Diagnosis Date  . Deep vein thrombosis (DVT) (HCC) 08/2016   Right leg  . Dry eyes 2010   on restasis - Dr. Jorja Loa   . Gout 2009  . Hyperlipidemia   . Hypertension   . Spinal stenosis     Past Surgical History:  Procedure Laterality Date  . CESAREAN SECTION    . COLONOSCOPY  04/09/2009   PJK:DTOI papilla otherwise, normal rectum/Left-sided diverticula/Cecal polyp status post cold snare and removal/The remainder of the colonic mucosa appeared normal. Adenomatous polyps, next TCS 03/2016 per RMR.  . COLONOSCOPY N/A 05/19/2016   Procedure: COLONOSCOPY;  Surgeon: Daneil Dolin, MD;  Location: [facility];  Service: Endoscopy;  Laterality: N/A;  1:00 PM    Family History  Problem Relation Age of Onset  . Stroke Mother   . Stroke Father   . Diabetes Sister   . Pulmonary embolism Sister   . Cirrhosis Brother   . Colon cancer Neg Hx     Social History Social History   Tobacco Use  . Smoking status: Former Smoker    Packs/day: 0.50    Years:  10.00    Pack years: 5.00    Types: Cigarettes    Last attempt to quit: 10/07/1983    Years since quitting: 33.9  . Smokeless tobacco: Never Used  Substance Use Topics  . Alcohol use: No  . Drug use: No    Allergies  Allergen Reactions  . Benazepril Other (See Comments)    Patient does not recall being on this medication and/or what reaction she might have had.  . Metoprolol     CRAMPS    Current Outpatient Medications  Medication Sig Dispense Refill  . amLODipine (NORVASC) 10 MG tablet TAKE 1 TABLET BY MOUTH EVERY DAY 90 tablet 0  . aspirin EC 81 MG tablet Take 81 mg by mouth daily.    . Calcium Carb-Cholecalciferol (CALCIUM 600/VITAMIN D3) 600-800 MG-UNIT TABS Take 1 tablet by mouth daily.    . Flaxseed, Linseed, (FLAX SEED OIL) 1000 MG CAPS Take by mouth daily.      Marland Kitchen ibuprofen (ADVIL,MOTRIN) 800 MG tablet TAKE 1 TABLET BY MOUTH WEEKLY IF NEEDED FOR UNCONTROLLED BACK PAIN 30 tablet 0  . Polyethyl Glycol-Propyl Glycol (SYSTANE) 0.4-0.3 % SOLN Apply 1 drop to eye 2 (two) times daily.    Marland Kitchen spironolactone (ALDACTONE) 50 MG tablet TAKE 1 TABLET(50 MG) BY MOUTH DAILY 90 tablet 1  . naproxen (NAPROSYN) 500 MG tablet Take 1 tablet (500 mg  total) by mouth 2 (two) times daily with a meal. 60 tablet 5   No current facility-administered medications for this visit.      Physical Exam  Blood pressure 134/80, pulse (!) 108, height 5' 2.5" (1.588 m), weight 156 lb (70.8 kg).  Constitutional: overall normal hygiene, normal nutrition, well developed, normal grooming, normal body habitus. Assistive device:none  Musculoskeletal: gait and station Limp left, muscle tone and strength are normal, no tremors or atrophy is present.  .  Neurological: coordination overall normal.  Deep tendon reflex/nerve stretch intact.  Sensation normal.  Cranial nerves II-XII intact.   Skin:   Normal overall no scars, lesions, ulcers or rashes. No psoriasis.  Psychiatric: Alert and oriented x 3.  Recent  memory intact, remote memory unclear.  Normal mood and affect. Well groomed.  Good eye contact.  Cardiovascular: overall no swelling, no varicosities, no edema bilaterally, normal temperatures of the legs and arms, no clubbing, cyanosis and good capillary refill.  Lymphatic: palpation is normal.  The left lower extremity is examined:  Inspection:  Thigh:  Non-tender and no defects  Knee has swelling 1+ effusion.                        Joint tenderness is present                        Patient is tender over the medial joint line  Lower Leg:  Has normal appearance and no tenderness or defects  Ankle:  Non-tender and no defects  Foot:  Non-tender and no defects Range of Motion:  Knee:  Range of motion is: 0-100                        Crepitus is  present  Ankle:  Range of motion is normal. Strength and Tone:  The left lower extremity has normal strength and tone. Stability:  Knee:  The knee is stable.  Ankle:  The ankle is stable.   All other systems reviewed and are negative   The patient has been educated about the nature of the problem(s) and counseled on treatment options.  The patient appeared to understand what I have discussed and is in agreement with it.  Encounter Diagnosis  Name Primary?  . Chronic pain of both knees Yes   PROCEDURE NOTE:  The patient requests injections of the left knee , verbal consent was obtained.  The left knee was prepped appropriately after time out was performed.   Sterile technique was observed and injection of 1 cc of Depo-Medrol 40 mg with several cc's of plain xylocaine. Anesthesia was provided by ethyl chloride and a 20-gauge needle was used to inject the knee area. The injection was tolerated well.  A band aid dressing was applied.  The patient was advised to apply ice later today and tomorrow to the injection sight as needed.  PROCEDURE NOTE:  The patient requests injections of the right knee , verbal consent was obtained.  The  right knee was prepped appropriately after time out was performed.   Sterile technique was observed and injection of 1 cc of Depo-Medrol 40 mg with several cc's of plain xylocaine. Anesthesia was provided by ethyl chloride and a 20-gauge needle was used to inject the knee area. The injection was tolerated well.  A band aid dressing was applied.  The patient was advised to apply ice later today and tomorrow to  the injection sight as needed.   PLAN Call if any problems.  Precautions discussed.  Return to clinic 2 weeks   I have called in Naprosyn to the pharmacy.  Electronically Signed Sanjuana Kava, MD 1/15/201910:16 AM

## 2017-09-20 ENCOUNTER — Ambulatory Visit: Payer: PPO | Admitting: Orthopaedic Surgery

## 2017-09-20 ENCOUNTER — Encounter: Payer: Self-pay | Admitting: Orthopaedic Surgery

## 2017-09-20 VITALS — BP 127/83 | HR 102 | Ht 62.5 in | Wt 154.0 lb

## 2017-09-20 DIAGNOSIS — G8929 Other chronic pain: Secondary | ICD-10-CM | POA: Diagnosis not present

## 2017-09-20 DIAGNOSIS — M25562 Pain in left knee: Secondary | ICD-10-CM | POA: Diagnosis not present

## 2017-09-20 DIAGNOSIS — M25561 Pain in right knee: Secondary | ICD-10-CM | POA: Diagnosis not present

## 2017-09-20 NOTE — Progress Notes (Signed)
CC:  My knees are much better  She had injections of both knees last visit.  She has little pain today. She is walking well.  NV is intact. She has slight crepitus and effusion of both knees.  ROM of the right is 0 to 110 and the left 0 to 105.  She has no limp.   Encounter Diagnosis  Name Primary?  . Chronic pain of both knees Yes   I will see her as needed.  Call if any problem.  Precautions discussed.   Electronically Signed Sanjuana Kava, MD 1/29/201910:04 AM

## 2017-10-23 ENCOUNTER — Other Ambulatory Visit: Payer: Self-pay | Admitting: Family Medicine

## 2017-11-01 ENCOUNTER — Encounter: Payer: Self-pay | Admitting: Family Medicine

## 2017-11-01 ENCOUNTER — Ambulatory Visit (INDEPENDENT_AMBULATORY_CARE_PROVIDER_SITE_OTHER): Payer: PPO | Admitting: Family Medicine

## 2017-11-01 ENCOUNTER — Encounter (INDEPENDENT_AMBULATORY_CARE_PROVIDER_SITE_OTHER): Payer: Self-pay

## 2017-11-01 VITALS — BP 140/80 | HR 89 | Resp 16 | Ht 63.0 in | Wt 159.0 lb

## 2017-11-01 DIAGNOSIS — G8929 Other chronic pain: Secondary | ICD-10-CM

## 2017-11-01 DIAGNOSIS — J309 Allergic rhinitis, unspecified: Secondary | ICD-10-CM | POA: Diagnosis not present

## 2017-11-01 DIAGNOSIS — I10 Essential (primary) hypertension: Secondary | ICD-10-CM

## 2017-11-01 DIAGNOSIS — E7800 Pure hypercholesterolemia, unspecified: Secondary | ICD-10-CM | POA: Diagnosis not present

## 2017-11-01 DIAGNOSIS — Z1231 Encounter for screening mammogram for malignant neoplasm of breast: Secondary | ICD-10-CM | POA: Diagnosis not present

## 2017-11-01 DIAGNOSIS — M25561 Pain in right knee: Secondary | ICD-10-CM

## 2017-11-01 DIAGNOSIS — M25511 Pain in right shoulder: Secondary | ICD-10-CM

## 2017-11-01 MED ORDER — AMLODIPINE BESYLATE 10 MG PO TABS: 10.0000 mg | ORAL_TABLET | Freq: Every day | ORAL | 1 refills | Status: DC

## 2017-11-01 MED ORDER — SPIRONOLACTONE 50 MG PO TABS: ORAL_TABLET | ORAL | 1 refills | Status: DC

## 2017-11-01 MED ORDER — PREDNISONE 10 MG PO TABS: 10.0000 mg | ORAL_TABLET | Freq: Two times a day (BID) | ORAL | 0 refills | Status: DC

## 2017-11-01 NOTE — Progress Notes (Signed)
   Kristen Cox     MRN: [id number]      DOB: Oct 07, 1940   HPI Kristen Cox is here for follow up and re-evaluation of chronic medical conditions, medication management and review of any available recent lab and radiology data.  Preventive health is updated, specifically  Cancer screening and Immunization.   C/o increased knee pain and stiffness and is somewhat disheartened with  the intra articular injections, as still has a lot of pain, also c/o right shoulder pain and stiffness, no trauma  ROS Denies recent fever or chills. Denies sinus pressure, nasal congestion, ear pain or sore throat. Denies chest congestion, productive cough or wheezing. Denies chest pains, palpitations and leg swelling Denies abdominal pain, nausea, vomiting,diarrhea or constipation.   Denies dysuria, frequency, hesitancy or incontinence.  Denies headaches, seizures, numbness, or tingling. Denies depression, anxiety or insomnia. Denies skin break down or rash.   PE  BP 140/80   Pulse 89   Resp 16   Ht 5\' 3"  (1.6 m)   Wt 159 lb (72.1 kg)   SpO2 94%   BMI 28.17 kg/m   Patient alert and oriented and in no cardiopulmonary distress.  HEENT: No facial asymmetry, EOMI,   oropharynx pink and moist.  Neck supple no JVD, no mass.  Chest: Clear to auscultation bilaterally.  CVS: S1, S2 no murmurs, no S3.Regular rate.  ABD: Soft non tender.   Ext: No edema  MS: Adequate ROM spine, decreased ROM right shoulder,adequate in  hips and reduced ROM in knees  Skin: Intact, no ulcerations or rash noted.  Psych: Good eye contact, normal affect. Memory intact not anxious or depressed appearing.  CNS: CN 2-12 intact, power,  normal throughout.no focal deficits noted.   Assessment & Plan  Essential hypertension Adequate control, no change in medication DASH diet and commitment to daily physical activity for a minimum of 30 minutes discussed and encouraged, as a part of hypertension management. The importance  of attaining a healthy weight is also discussed.  BP/Weight 11/01/2017 09/20/2017 09/06/2017 07/06/2017 06/23/2017 0/17/7939 0/10/90  Systolic BP 330 076 226 333 545 625 638  Diastolic BP 80 83 80 90 82 74 84  Wt. (Lbs) 159 154 156 154 157 158 159  BMI 28.17 27.72 28.08 27.72 27.81 27.99 28.17       Knee pain, right Uncontrolled, prednisone prescribed short term  Shoulder pain, right Prednisone short term prescribed  Hyperlipemia Hyperlipidemia:Low fat diet discussed and encouraged.   Lipid Panel  Lab Results  Component Value Date   CHOL 221 (H) 06/15/2017   HDL 50 (L) 06/15/2017   LDLCALC 149 (H) 06/15/2017   TRIG 107 06/15/2017   CHOLHDL 4.4 06/15/2017   Uncontrolled needs to follow a low fat diet Updated lab needed at/ before next visit.     Allergic rhinitis No current flare

## 2017-11-01 NOTE — Patient Instructions (Addendum)
Wellness with nurse in May  Annual Physical  exam with  MD end August , call if you need me before  Please schedule mammogram May 1 or after before she leaves   Stop naprosyn regularly, use topical icy hot for knee pain  And start water aerobics also ES tylenol 1 daily is safe   I am sending in pred nisone 10  mg one twice daily for 5 days for right shoulder pain and knee pain  Thank you  for choosing [facility]. We consider it a privelige to serve you.  Delivering excellent health care in a caring and  compassionate way is our goal.  Partnering with you,  so that together we can achieve this goal is our strategy.

## 2017-11-06 DIAGNOSIS — M25511 Pain in right shoulder: Secondary | ICD-10-CM | POA: Insufficient documentation

## 2017-11-06 NOTE — Assessment & Plan Note (Signed)
No current flare 

## 2017-11-06 NOTE — Assessment & Plan Note (Signed)
Prednisone short term prescribed

## 2017-11-06 NOTE — Assessment & Plan Note (Signed)
Adequate control, no change in medication DASH diet and commitment to daily physical activity for a minimum of 30 minutes discussed and encouraged, as a part of hypertension management. The importance of attaining a healthy weight is also discussed.  BP/Weight 11/01/2017 09/20/2017 09/06/2017 07/06/2017 06/23/2017 09/29/2180 03/30/3373  Systolic BP 451 460 479 987 215 872 761  Diastolic BP 80 83 80 90 82 74 84  Wt. (Lbs) 159 154 156 154 157 158 159  BMI 28.17 27.72 28.08 27.72 27.81 27.99 28.17

## 2017-11-06 NOTE — Assessment & Plan Note (Addendum)
Hyperlipidemia:Low fat diet discussed and encouraged.   Lipid Panel  Lab Results  Component Value Date   CHOL 221 (H) 06/15/2017   HDL 50 (L) 06/15/2017   LDLCALC 149 (H) 06/15/2017   TRIG 107 06/15/2017   CHOLHDL 4.4 06/15/2017   Uncontrolled needs to follow a low fat diet Updated lab needed at/ before next visit.

## 2017-11-06 NOTE — Assessment & Plan Note (Signed)
Uncontrolled, prednisone prescribed short term

## 2017-11-08 ENCOUNTER — Emergency Department (HOSPITAL_COMMUNITY)
Admission: EM | Admit: 2017-11-08 | Discharge: 2017-11-08 | Disposition: A | Discharge status: Home / Self Care | Payer: PPO | Attending: Emergency Medicine | Admitting: Emergency Medicine

## 2017-11-08 ENCOUNTER — Emergency Department (HOSPITAL_COMMUNITY): Payer: PPO

## 2017-11-08 ENCOUNTER — Encounter: Payer: Self-pay | Admitting: Family Medicine

## 2017-11-08 ENCOUNTER — Telehealth: Payer: Self-pay | Admitting: Family Medicine

## 2017-11-08 ENCOUNTER — Other Ambulatory Visit: Payer: Self-pay

## 2017-11-08 ENCOUNTER — Encounter (HOSPITAL_COMMUNITY): Payer: Self-pay | Admitting: Emergency Medicine

## 2017-11-08 ENCOUNTER — Other Ambulatory Visit: Payer: Self-pay | Admitting: Family Medicine

## 2017-11-08 ENCOUNTER — Ambulatory Visit (INDEPENDENT_AMBULATORY_CARE_PROVIDER_SITE_OTHER): Payer: PPO | Admitting: Family Medicine

## 2017-11-08 VITALS — BP 140/80 | HR 97 | Resp 16 | Ht 63.0 in | Wt 158.0 lb

## 2017-11-08 DIAGNOSIS — I10 Essential (primary) hypertension: Secondary | ICD-10-CM

## 2017-11-08 DIAGNOSIS — M79605 Pain in left leg: Secondary | ICD-10-CM | POA: Diagnosis not present

## 2017-11-08 DIAGNOSIS — M7989 Other specified soft tissue disorders: Secondary | ICD-10-CM | POA: Diagnosis not present

## 2017-11-08 DIAGNOSIS — Z79899 Other long term (current) drug therapy: Secondary | ICD-10-CM | POA: Insufficient documentation

## 2017-11-08 DIAGNOSIS — Z7982 Long term (current) use of aspirin: Secondary | ICD-10-CM | POA: Diagnosis not present

## 2017-11-08 DIAGNOSIS — Z87891 Personal history of nicotine dependence: Secondary | ICD-10-CM | POA: Diagnosis not present

## 2017-11-08 DIAGNOSIS — M79662 Pain in left lower leg: Secondary | ICD-10-CM | POA: Diagnosis not present

## 2017-11-08 MED ORDER — HYDROCODONE-ACETAMINOPHEN 5-325 MG PO TABS: 2.0000 | ORAL_TABLET | ORAL | 0 refills | Status: DC | PRN

## 2017-11-08 MED ORDER — HYDROCODONE-ACETAMINOPHEN 5-325 MG PO TABS
2.0000 | ORAL_TABLET | Freq: Once | ORAL | Status: AC
  Administered 2017-11-08: 2 via ORAL
  Filled 2017-11-08: qty 2

## 2017-11-08 MED ORDER — TIZANIDINE HCL 2 MG PO TABS: ORAL_TABLET | ORAL | 0 refills | Status: DC

## 2017-11-08 NOTE — ED Triage Notes (Signed)
Pt states having left leg pain.  No new injuries.

## 2017-11-08 NOTE — Patient Instructions (Signed)
You need to go to the ED because of acute calf swelling, need to rule out clot in your leg  Zanaflex muscle relaxant is sent in for muscle spasm , bedtime use onl;ly

## 2017-11-08 NOTE — Telephone Encounter (Signed)
Scheduled for 3pm today, patient is aware

## 2017-11-08 NOTE — Discharge Instructions (Signed)
Your ultrasound showed no signs of blood clots.  You have known fairly advanced arthritis in your left knee which is likely causing much of your symptoms.  Please avoid ibuprofen or other anti-inflammatory medications.  Take either Tylenol or hydrocodone which I have prescribed today.  You may take 1 tablet of hydrocodone, if no improvement in pain you may take a second tablet within 4 hours.  Please take these tablets no more than 2 tablets every 6 hours and make sure that you take a stool softener if you are going to use these pain medicines as they will make you constipated.

## 2017-11-08 NOTE — Progress Notes (Signed)
   Kristen Cox     MRN: [id number]      DOB: 05/24/1941   HPI Kristen Cox is here with a 3 day h/o acute left foot pain and  Swelling which makes it difficult for her to weight bear. She denies any trauma to the foot  She has chronic severe osteoarthritis of bot knees , right worse than left, her pain is more in the calf than the knee She has had a left DVT in the recent past Currently denies dyspnea or hemoptysis, denies chest pain with breathing  ROS Denies recent fever or chills. Denies sinus pressure, nasal congestion, ear pain or sore throat. Denies chest congestion, productive cough or wheezing. Denies chest pains, palpitationsa or constipation.   Denies dysuria, frequency, hesitancy or incontinence.  Denies headaches, seizures, numbness, or tingling. Denies depression, anxiety or insomnia. Denies skin break down or rash.   PE  BP 140/80   Pulse 97   Resp 16   Ht 5\' 3"  (1.6 m)   Wt 158 lb (71.7 kg)   SpO2 98%   BMI 27.99 kg/m   Patient alert and oriented and in no cardiopulmonary distress. Patient in severe pain and unable to weight bear on right leg  HEENT: No facial asymmetry, EOMI,   oropharynx pink and moist.  Neck supple no JVD, no mass.  Chest: Clear to auscultation bilaterally.  CVS: S1, S2 no murmurs, no S3.Regular rate.  ABD: Soft non tender.   Ext : left calf solen warm and ender. Homans positive [id number]  ROM lumbar  spine, shoulders, hips and knees.  Skin: Intact, no ulcerations or rash noted.  Psych: Good eye contact, normal affect. Memory intact not anxious or depressed appearing.  CNS: CN 2-12 intact,   Assessment & Plan Left leg pain Acute onset left leg pain with calf swelling and tenderness, very concerning for acute DVT based on symptoms and signs. Discussed wit ED physician and pt sent to ED for eval and management . She had no cardiopulmonary compromise and was taken by her son in his private vehicle  Essential  hypertension Slightly elevated at visit , however , pt in a significant amount of pain, no changes inm medication made

## 2017-11-08 NOTE — Telephone Encounter (Signed)
Requesting medication for gout asap, says she cant get up because her feet are so swollen Pharmacy: [facility] [address] Cb#: [phone]

## 2017-11-08 NOTE — Telephone Encounter (Signed)
Need evaluation in office for acute appt

## 2017-11-08 NOTE — ED Provider Notes (Signed)
[facility] EMERGENCY DEPARTMENT Provider Note   CSN: [id number] Arrival date & time: 11/08/17  1647     History   Chief Complaint Chief Complaint  Patient presents with  . Leg Pain    HPI Kristen Cox is a 77 y.o. female.  HPI  The patient is a 77 year old female, she presents to the hospital today because of some pain of her left lower extremity.  Her family doctor called me to let me know that she was coming over and needed to have ultrasound of the legs.  The ultrasound was performed and thankfully showed no signs of DVT.  The patient did have a DVT approximately 1 year ago but is no longer on anticoagulants.  She has been followed by Dr. Luna Glasgow with orthopedics who has done injections in her bilateral knees as well as treated her with a recent course of prednisone which stopped on Sunday.  After stopping the prednisone she had some increasing stiffness and pain in her left leg when she walks but denies any significant swelling, fevers, vomiting or other symptoms.  Review of the medical record shows that the patient recently had an x-ray done approximately 2 months ago of her left knee, this is the same knee where she has been having increased pain.  It showed that there was mild to moderate degenerative joint disease with tricompartmental changes.  There was an effusion noted at that time.  Past Medical History:  Diagnosis Date  . Deep vein thrombosis (DVT) (HCC) 08/2016   Right leg  . Dry eyes 2010   on restasis - Dr. Jorja Loa   . Gout 2009  . Hyperlipidemia   . Hypertension   . Spinal stenosis     Patient Active Problem List   Diagnosis Date Noted  . Left leg pain 11/08/2017  . Shoulder pain, right 11/06/2017  . Thumb pain, left 06/23/2017  . Knee pain, right 06/23/2017  . Secondary hyperparathyroidism ([place]) 04/21/2017  . History of colonic polyps 05/06/2016  . Lumbar back pain with radiculopathy affecting right lower extremity 03/24/2015  . Hypercalcemia  11/19/2013  . IMPAIRED FASTING GLUCOSE 04/29/2010  . Hyperlipemia 02/13/2010  . Gout 01/27/2010  . Essential hypertension 01/27/2010  . Allergic rhinitis 01/27/2010    Past Surgical History:  Procedure Laterality Date  . CESAREAN SECTION    . COLONOSCOPY  04/09/2009   BOF:[id number] papilla otherwise, normal rectum/Left-sided diverticula/Cecal polyp status post cold snare and removal/The remainder of the colonic mucosa appeared normal. Adenomatous polyps, next TCS 03/2016 per RMR.  . COLONOSCOPY N/A 05/19/2016   Procedure: COLONOSCOPY;  Surgeon: Daneil Dolin, MD;  Location: [facility];  Service: Endoscopy;  Laterality: N/A;  1:00 PM    OB History    No data available       Home Medications    Prior to Admission medications   Medication Sig Start Date End Date Taking? Authorizing Provider  amLODipine (NORVASC) 10 MG tablet Take 1 tablet (10 mg total) by mouth daily. 11/01/17  Yes Fayrene Helper, MD  aspirin EC 81 MG tablet Take 81 mg by mouth daily.   Yes [provider]  Polyethyl Glycol-Propyl Glycol (SYSTANE) 0.4-0.3 % SOLN Apply 1 drop to eye 2 (two) times daily.   Yes [provider]  predniSONE (DELTASONE) 10 MG tablet Take 1 tablet (10 mg total) by mouth 2 (two) times daily with a meal. 11/01/17  Yes Fayrene Helper, MD  spironolactone (ALDACTONE) 50 MG tablet One tab daily  11/01/17  Yes Fayrene Helper, MD  HYDROcodone-acetaminophen (NORCO/VICODIN) 5-325 MG tablet Take 2 tablets by mouth every 4 (four) hours as needed for moderate pain. 11/08/17   Noemi Chapel, MD  naproxen (NAPROSYN) 500 MG tablet Take 1 tablet (500 mg total) by mouth 2 (two) times daily with a meal. Patient not taking: Reported on 11/08/2017 09/06/17   Sanjuana Kava, MD  tiZANidine (ZANAFLEX) 2 MG tablet One tablet at bedtime as needed got muscle spasm Patient not taking: Reported on 11/08/2017 11/08/17   Fayrene Helper, MD    Family History Family History  Problem Relation  Age of Onset  . Stroke Mother   . Stroke Father   . Diabetes Sister   . Pulmonary embolism Sister   . Cirrhosis Brother   . Colon cancer Neg Hx     Social History Social History   Tobacco Use  . Smoking status: Former Smoker    Packs/day: 0.50    Years: 10.00    Pack years: 5.00    Types: Cigarettes    Last attempt to quit: 10/07/1983    Years since quitting: 34.1  . Smokeless tobacco: Never Used  Substance Use Topics  . Alcohol use: No  . Drug use: No     Allergies   Benazepril and Metoprolol   Review of Systems Review of Systems  Constitutional: Negative for fever.  Musculoskeletal: Positive for gait problem and joint swelling.  Skin: Negative for rash and wound.  Neurological: Negative for weakness and numbness.     Physical Exam Updated Vital Signs BP 140/90 (BP Location: Right Arm)   Pulse 97   Temp 98.4 F (36.9 C) (Oral)   Resp 18   SpO2 100%   Physical Exam  Constitutional: She appears well-developed and well-nourished.  HENT:  Head: Normocephalic and atraumatic.  Eyes: Conjunctivae are normal. Right eye exhibits no discharge. Left eye exhibits no discharge.  Pulmonary/Chest: Effort normal. No respiratory distress.  Musculoskeletal:  The patient is very supple joints at the hips knees and ankles, there is no leg length discrepancies, she is able to straight leg raise bilaterally.  She does have some crepitance in her bilateral knees with range of motion however there is no laxity to anterior posterior medial or lateral stresses.  There is no obvious effusions redness or warmth to the joints  Neurological: She is alert. Coordination normal.  Skin: Skin is warm and dry. No rash noted. She is not diaphoretic. No erythema.  Psychiatric: She has a normal mood and affect.  Nursing note and vitals reviewed.    ED Treatments / Results  Labs (all labs ordered are listed, but only abnormal results are displayed) Labs Reviewed - No data to  display  EKG  EKG Interpretation None       Radiology US Venous Img Lower Bilateral  Result Date: 11/08/2017 CLINICAL DATA:  Swelling and pain in LEFT lower extremity for 3 days, history of RIGHT lower extremity DVT in January 2018 EXAM: BILATERAL LOWER EXTREMITY VENOUS DOPPLER ULTRASOUND TECHNIQUE: Gray-scale sonography with graded compression, as well as color Doppler and duplex ultrasound were performed to evaluate the lower extremity deep venous systems from the level of the common femoral vein and including the common femoral, femoral, profunda femoral, popliteal and calf veins including the posterior tibial, peroneal and gastrocnemius veins when visible. The superficial great saphenous vein was also interrogated. Spectral Doppler was utilized to evaluate flow at rest and with distal augmentation maneuvers in the common femoral, femoral  and popliteal veins. COMPARISON:  RIGHT lower extremity venous ultrasound 08/23/2016 FINDINGS: RIGHT LOWER EXTREMITY Common Femoral Vein: No evidence of thrombus. Normal compressibility, respiratory phasicity and response to augmentation. Saphenofemoral Junction: No evidence of thrombus. Normal compressibility and flow on color Doppler imaging. Profunda Femoral Vein: No evidence of thrombus. Normal compressibility and flow on color Doppler imaging. Femoral Vein: No evidence of thrombus. Normal compressibility, respiratory phasicity and response to augmentation. Popliteal Vein: No evidence of thrombus. Normal compressibility, respiratory phasicity and response to augmentation. Calf Veins: No evidence of thrombus. Normal compressibility and flow on color Doppler imaging. Superficial Great Saphenous Vein: No evidence of thrombus. Normal compressibility. Venous Reflux:  None. Other Findings:  None. LEFT LOWER EXTREMITY Common Femoral Vein: No evidence of thrombus. Normal compressibility, respiratory phasicity and response to augmentation. Saphenofemoral Junction: No  evidence of thrombus. Normal compressibility and flow on color Doppler imaging. Profunda Femoral Vein: No evidence of thrombus. Normal compressibility and flow on color Doppler imaging. Femoral Vein: No evidence of thrombus. Normal compressibility, respiratory phasicity and response to augmentation. Popliteal Vein: No evidence of thrombus. Normal compressibility, respiratory phasicity and response to augmentation. Calf Veins: No evidence of thrombus. Normal compressibility and flow on color Doppler imaging. Superficial Great Saphenous Vein: No evidence of thrombus. Normal compressibility. Venous Reflux:  None. Other Findings:  None. IMPRESSION: No evidence of deep venous thrombosis in either lower extremity. Electronically Signed   By: Lavonia Dana M.D.   On: 11/08/2017 18:02    Procedures Procedures (including critical care time)  Medications Ordered in ED Medications  HYDROcodone-acetaminophen (NORCO/VICODIN) 5-325 MG per tablet 2 tablet (not administered)     Initial Impression / Assessment and Plan / ED Course  I have reviewed the triage vital signs and the nursing notes.  Pertinent labs & imaging results that were available during my care of the patient were reviewed by me and considered in my medical decision making (see chart for details).    Reviewed ultrasound performed at the beginning of the visit.  Patient was informed that there was no acute findings on the ultrasound, she was given reassurance that she can follow-up in the outpatient setting.  She was given Vicodin prior to discharge and a small number of Vicodin to bridge her to see an orthopedist.  The patient was offered to see the orthopedist locally , Dr. Aline Brochure, she is in agreement.  She does not want to go to [place] to see the person who is actually on call today.  Final Clinical Impressions(s) / ED Diagnoses   Final diagnoses:  Left leg pain    ED Discharge Orders        Ordered    HYDROcodone-acetaminophen  (NORCO/VICODIN) 5-325 MG tablet  Every 4 hours PRN     11/08/17 2248       Noemi Chapel, MD 11/08/17 2252

## 2017-11-09 ENCOUNTER — Other Ambulatory Visit: Payer: Self-pay | Admitting: Family Medicine

## 2017-11-09 DIAGNOSIS — M25562 Pain in left knee: Secondary | ICD-10-CM

## 2017-11-09 DIAGNOSIS — M170 Bilateral primary osteoarthritis of knee: Secondary | ICD-10-CM

## 2017-11-11 ENCOUNTER — Ambulatory Visit: Payer: Self-pay | Admitting: Orthopedic Surgery

## 2017-11-11 NOTE — Progress Notes (Deleted)
  NEW PATIENT OFFICE VISIT   No chief complaint on file.   HPI  ROS   Past Medical History:  Diagnosis Date  . Deep vein thrombosis (DVT) (HCC) 08/2016   Right leg  . Dry eyes 2010   on restasis - Dr. Jorja Loa   . Gout 2009  . Hyperlipidemia   . Hypertension   . Spinal stenosis     Past Surgical History:  Procedure Laterality Date  . CESAREAN SECTION    . COLONOSCOPY  04/09/2009   QJF:HLKT papilla otherwise, normal rectum/Left-sided diverticula/Cecal polyp status post cold snare and removal/The remainder of the colonic mucosa appeared normal. Adenomatous polyps, next TCS 03/2016 per RMR.  . COLONOSCOPY N/A 05/19/2016   Procedure: COLONOSCOPY;  Surgeon: Daneil Dolin, MD;  Location: [facility];  Service: Endoscopy;  Laterality: N/A;  1:00 PM    Family History  Problem Relation Age of Onset  . Stroke Mother   . Stroke Father   . Diabetes Sister   . Pulmonary embolism Sister   . Cirrhosis Brother   . Colon cancer Neg Hx    Social History   Tobacco Use  . Smoking status: Former Smoker    Packs/day: 0.50    Years: 10.00    Pack years: 5.00    Types: Cigarettes    Last attempt to quit: 10/07/1983    Years since quitting: 34.1  . Smokeless tobacco: Never Used  Substance Use Topics  . Alcohol use: No  . Drug use: No    @ALL @  No outpatient medications have been marked as taking for the 11/11/17 encounter (Appointment) with Carole Civil, MD.    There were no vitals taken for this visit.  Physical Exam  Ortho Exam  MEDICAL DECISION SECTION  xrays ordered? ***  My independent reading of xrays: ***   No diagnosis found.   PLAN:   No orders of the defined types were placed in this encounter.  Injection? *** MRI/CT/? ***

## 2017-11-14 NOTE — Assessment & Plan Note (Signed)
Acute onset left leg pain with calf swelling and tenderness, very concerning for acute DVT based on symptoms and signs. Discussed wit ED physician and pt sent to ED for eval and management . She had no cardiopulmonary compromise and was taken by her son in his private vehicle

## 2017-11-14 NOTE — Assessment & Plan Note (Signed)
Slightly elevated at visit , however , pt in a significant amount of pain, no changes inm medication made

## 2017-11-16 ENCOUNTER — Ambulatory Visit: Payer: PPO | Admitting: Orthopedic Surgery

## 2017-11-16 ENCOUNTER — Encounter: Payer: Self-pay | Admitting: Orthopedic Surgery

## 2017-11-16 VITALS — BP 139/87 | HR 95 | Ht 63.0 in | Wt 158.0 lb

## 2017-11-16 DIAGNOSIS — M25562 Pain in left knee: Secondary | ICD-10-CM | POA: Diagnosis not present

## 2017-11-16 MED ORDER — PREDNISONE 10 MG PO TABS: 10.0000 mg | ORAL_TABLET | Freq: Two times a day (BID) | ORAL | 0 refills | Status: DC

## 2017-11-16 NOTE — Progress Notes (Signed)
Progress Note   Patient ID: Kristen Cox, female   DOB: 12/12/1940, 77 y.o.   MRN: [id number]  Chief Complaint  Patient presents with  . Knee Pain    Left knee pain, Referred by Dr. Moshe Cipro.    77 year old female recently saw Dr. Luna Glasgow for bilateral knee pain presents now with acute onset of knee pain with a hot swollen left leg.  She went to the emergency room was evaluated for DVT which was normal.  She presents now with soreness bilateral knees left worse than right times 8 days.  Initially she had trouble walking now she says that she has sore.  She does have a history of gout but has not had an attack in a long time.  She used a cane for about a week and then decided to try to walk on her own and has been able to do that successfully    Review of Systems  Constitutional: Negative for fever and weight loss.  Musculoskeletal: Negative for myalgias.  Skin: Negative.    No outpatient medications have been marked as taking for the 11/16/17 encounter (Office Visit) with Carole Civil, MD.    Past Medical History:  Diagnosis Date  . Deep vein thrombosis (DVT) (HCC) 08/2016   Right leg  . Dry eyes 2010   on restasis - Dr. Jorja Loa   . Gout 2009  . Hyperlipidemia   . Hypertension   . Spinal stenosis      Allergies  Allergen Reactions  . Benazepril Other (See Comments)    Patient does not recall being on this medication and/or what reaction she might have had.  . Metoprolol     CRAMPS    BP 139/87   Pulse 95   Ht 5\' 3"  (1.6 m)   Wt 158 lb (71.7 kg)   BMI 27.99 kg/m    Physical Exam  Constitutional: She is oriented to person, place, and time. She appears well-developed and well-nourished.  Musculoskeletal:       Left knee: She exhibits effusion.  Neurological: She is alert and oriented to person, place, and time. Gait normal.  Psychiatric: She has a normal mood and affect. Judgment normal.  Vitals reviewed.   Right Knee Exam   Muscle Strength  The  patient has normal right knee strength.  Tenderness  The patient is experiencing no tenderness.   Range of Motion  Extension: normal  Flexion: normal   Tests  McMurray:  Medial - negative Lateral - negative Varus: negative Valgus: negative Drawer:  Anterior - negative    Posterior - negative  Other  Erythema: absent Scars: absent Sensation: normal Pulse: present Swelling: none   Left Knee Exam   Muscle Strength  The patient has normal left knee strength.  Tenderness  The patient is experiencing no tenderness.   Range of Motion  Extension: normal  Flexion: normal   Tests  McMurray:  Medial - negative Lateral - negative Varus: negative Valgus: negative Drawer:  Anterior - negative     Posterior - negative  Other  Erythema: absent Scars: absent Sensation: normal Pulse: present Swelling: none Effusion: effusion present       Medical decision-making  Imaging: The last image I have was from September 06, 2017 it showed arthritis of the medial and lateral compartments with joint effusion  She had a DVT study which was negative   Encounter Diagnosis  Name Primary?  . Acute pain of left knee Yes    Meds ordered this  encounter  Medications  . predniSONE (DELTASONE) 10 MG tablet    Sig: Take 1 tablet (10 mg total) by mouth 2 (two) times daily with a meal.    Dispense:  28 tablet    Refill:  0   She will take the medication for the next 2 weeks this should resolve the issue.  I did tell her that at some point she will need to consider knee replacement surgery  Arther Abbott, MD 11/16/2017 11:06 AM

## 2017-12-20 ENCOUNTER — Ambulatory Visit (INDEPENDENT_AMBULATORY_CARE_PROVIDER_SITE_OTHER): Payer: PPO

## 2017-12-20 ENCOUNTER — Telehealth: Payer: Self-pay | Admitting: Family Medicine

## 2017-12-20 ENCOUNTER — Other Ambulatory Visit: Payer: Self-pay | Admitting: Family Medicine

## 2017-12-20 VITALS — BP 130/82 | HR 84 | Temp 98.1°F | Resp 16 | Ht 63.5 in | Wt 159.0 lb

## 2017-12-20 DIAGNOSIS — Z0000 Encounter for general adult medical examination without abnormal findings: Secondary | ICD-10-CM | POA: Diagnosis not present

## 2017-12-20 MED ORDER — GABAPENTIN 100 MG PO CAPS: ORAL_CAPSULE | ORAL | 3 refills | Status: DC

## 2017-12-20 NOTE — Telephone Encounter (Signed)
Patient is requesting pain medication for back pain.  Uses [facility] pharmacy Cb#: [phone]

## 2017-12-20 NOTE — Patient Instructions (Signed)
Ms. Kristen Cox , Thank you for taking time to come for your Medicare Wellness Visit. I appreciate your ongoing commitment to your health goals. Please review the following plan we discussed and let me know if I can assist you in the future.   Screening recommendations/referrals: Colonoscopy: no longer required Mammogram: Dec 21, 2017 @ 9:30 Please do not wear any lotions, deodorants or perfumes Bone Density: UTD Recommended yearly ophthalmology/optometry visit for glaucoma screening and checkup: September 2019 Recommended yearly dental visit for hygiene and checkup: Please schedule a visit  Vaccinations: Influenza vaccine: Due Fall 2019 Pneumococcal vaccine: UTD Tdap vaccine:Please call your insurance company to see if they will cover this vaccine Shingles vaccine: Please call your insurance company to see if they will cover this vaccine    Advanced directives: I've given you a packet to complete. Please complete it and bring it back to our office so that we can have a copy for your chart  Conditions/risks identified: Discussed  Next appointment: April 18, 2018 at 9:20 am   Preventive Care 66 Years and Older, Female Preventive care refers to lifestyle choices and visits with your health care provider that can promote health and wellness. What does preventive care include?  A yearly physical exam. This is also called an annual well check.  Dental exams once or twice a year.  Routine eye exams. Ask your health care provider how often you should have your eyes checked.  Personal lifestyle choices, including:  Daily care of your teeth and gums.  Regular physical activity.  Eating a healthy diet.  Avoiding tobacco and drug use.  Limiting alcohol use.  Practicing safe sex.  Taking low-dose aspirin every day.  Taking vitamin and mineral supplements as recommended by your health care provider. What happens during an annual well check? The services and screenings done by your  health care provider during your annual well check will depend on your age, overall health, lifestyle risk factors, and family history of disease. Counseling  Your health care provider may ask you questions about your:  Alcohol use.  Tobacco use.  Drug use.  Emotional well-being.  Home and relationship well-being.  Sexual activity.  Eating habits.  History of falls.  Memory and ability to understand (cognition).  Work and work Statistician.  Reproductive health. Screening  You may have the following tests or measurements:  Height, weight, and BMI.  Blood pressure.  Lipid and cholesterol levels. These may be checked every 5 years, or more frequently if you are over 67 years old.  Skin check.  Lung cancer screening. You may have this screening every year starting at age 27 if you have a 30-pack-year history of smoking and currently smoke or have quit within the past 15 years.  Fecal occult blood test (FOBT) of the stool. You may have this test every year starting at age 26.  Flexible sigmoidoscopy or colonoscopy. You may have a sigmoidoscopy every 5 years or a colonoscopy every 10 years starting at age 33.  Hepatitis C blood test.  Hepatitis B blood test.  Sexually transmitted disease (STD) testing.  Diabetes screening. This is done by checking your blood sugar (glucose) after you have not eaten for a while (fasting). You may have this done every 1-3 years.  Bone density scan. This is done to screen for osteoporosis. You may have this done starting at age 73.  Mammogram. This may be done every 1-2 years. Talk to your health care provider about how often you should have  regular mammograms. Talk with your health care provider about your test results, treatment options, and if necessary, the need for more tests. Vaccines  Your health care provider may recommend certain vaccines, such as:  Influenza vaccine. This is recommended every year.  Tetanus, diphtheria, and  acellular pertussis (Tdap, Td) vaccine. You may need a Td booster every 10 years.  Zoster vaccine. You may need this after age 75.  Pneumococcal 13-valent conjugate (PCV13) vaccine. One dose is recommended after age 12.  Pneumococcal polysaccharide (PPSV23) vaccine. One dose is recommended after age 50. Talk to your health care provider about which screenings and vaccines you need and how often you need them. This information is not intended to replace advice given to you by your health care provider. Make sure you discuss any questions you have with your health care provider. Document Released: 09/05/2015 Document Revised: 04/28/2016 Document Reviewed: 06/10/2015 Elsevier Interactive Patient Education  2017 [place] Prevention in the Home Falls can cause injuries. They can happen to people of all ages. There are many things you can do to make your home safe and to help prevent falls. What can I do on the outside of my home?  Regularly fix the edges of walkways and driveways and fix any cracks.  Remove anything that might make you trip as you walk through a door, such as a raised step or threshold.  Trim any bushes or trees on the path to your home.  Use bright outdoor lighting.  Clear any walking paths of anything that might make someone trip, such as rocks or tools.  Regularly check to see if handrails are loose or broken. Make sure that both sides of any steps have handrails.  Any raised decks and porches should have guardrails on the edges.  Have any leaves, snow, or ice cleared regularly.  Use sand or salt on walking paths during winter.  Clean up any spills in your garage right away. This includes oil or grease spills. What can I do in the bathroom?  Use night lights.  Install grab bars by the toilet and in the tub and shower. Do not use towel bars as grab bars.  Use non-skid mats or decals in the tub or shower.  If you need to sit down in the shower, use  a plastic, non-slip stool.  Keep the floor dry. Clean up any water that spills on the floor as soon as it happens.  Remove soap buildup in the tub or shower regularly.  Attach bath mats securely with double-sided non-slip rug tape.  Do not have throw rugs and other things on the floor that can make you trip. What can I do in the bedroom?  Use night lights.  Make sure that you have a light by your bed that is easy to reach.  Do not use any sheets or blankets that are too big for your bed. They should not hang down onto the floor.  Have a firm chair that has side arms. You can use this for support while you get dressed.  Do not have throw rugs and other things on the floor that can make you trip. What can I do in the kitchen?  Clean up any spills right away.  Avoid walking on wet floors.  Keep items that you use a lot in easy-to-reach places.  If you need to reach something above you, use a strong step stool that has a grab bar.  Keep electrical cords out of the  way.  Do not use floor polish or wax that makes floors slippery. If you must use wax, use non-skid floor wax.  Do not have throw rugs and other things on the floor that can make you trip. What can I do with my stairs?  Do not leave any items on the stairs.  Make sure that there are handrails on both sides of the stairs and use them. Fix handrails that are broken or loose. Make sure that handrails are as long as the stairways.  Check any carpeting to make sure that it is firmly attached to the stairs. Fix any carpet that is loose or worn.  Avoid having throw rugs at the top or bottom of the stairs. If you do have throw rugs, attach them to the floor with carpet tape.  Make sure that you have a light switch at the top of the stairs and the bottom of the stairs. If you do not have them, ask someone to add them for you. What else can I do to help prevent falls?  Wear shoes that:  Do not have high heels.  Have  rubber bottoms.  Are comfortable and fit you well.  Are closed at the toe. Do not wear sandals.  If you use a stepladder:  Make sure that it is fully opened. Do not climb a closed stepladder.  Make sure that both sides of the stepladder are locked into place.  Ask someone to hold it for you, if possible.  Clearly mark and make sure that you can see:  Any grab bars or handrails.  First and last steps.  Where the edge of each step is.  Use tools that help you move around (mobility aids) if they are needed. These include:  Canes.  Walkers.  Scooters.  Crutches.  Turn on the lights when you go into a dark area. Replace any light bulbs as soon as they burn out.  Set up your furniture so you have a clear path. Avoid moving your furniture around.  If any of your floors are uneven, fix them.  If there are any pets around you, be aware of where they are.  Review your medicines with your doctor. Some medicines can make you feel dizzy. This can increase your chance of falling. Ask your doctor what other things that you can do to help prevent falls. This information is not intended to replace advice given to you by your health care provider. Make sure you discuss any questions you have with your health care provider. Document Released: 06/05/2009 Document Revised: 01/15/2016 Document Reviewed: 09/13/2014 Elsevier Interactive Patient Education  2017 [employer].

## 2017-12-20 NOTE — Telephone Encounter (Signed)
Patient is stating her back has flared up again. Hurting in her lower back and both her legs ache. She is wanting the medication you gave her before for this. She said it was a capsule and may have been gabapentin. Please advise

## 2017-12-20 NOTE — Telephone Encounter (Signed)
Gabapentin is sent to her pharmacy

## 2017-12-20 NOTE — Progress Notes (Signed)
Subjective:   Kristen Cox is a 77 y.o. female who presents for Medicare Annual (Subsequent) preventive examination.  Review of Systems:   Cardiac Risk Factors include: advanced age (>50men, >6 women)     Objective:     Vitals: BP 130/82 (BP Location: Left Arm, Patient Position: Sitting, Cuff Size: Normal)   Pulse 84   Temp 98.1 F (36.7 C) (Oral)   Resp 16   Ht 5' 3.5" (1.613 m)   Wt 159 lb 0.6 oz (72.1 kg)   SpO2 97%   BMI 27.73 kg/m   Body mass index is 27.73 kg/m.  Advanced Directives 12/20/2017 11/08/2017 02/14/2017 05/19/2016  Does Patient Have a Medical Advance Directive? No No No No  Would patient like information on creating a medical advance directive? Yes (MAU/Ambulatory/Procedural Areas - Information given) - Yes (MAU/Ambulatory/Procedural Areas - Information given) No - patient declined information    Tobacco Social History   Tobacco Use  Smoking Status Former Smoker  . Packs/day: 0.50  . Years: 10.00  . Pack years: 5.00  . Types: Cigarettes  . Last attempt to quit: 10/07/1983  . Years since quitting: 34.2  Smokeless Tobacco Never Used     Counseling given: Not Answered   Clinical Intake:  Pre-visit preparation completed: Yes  Pain : No/denies pain     BMI - recorded: 27.7 Nutritional Status: BMI 25 -29 Overweight Diabetes: No  How often do you need to have someone help you when you read instructions, pamphlets, or other written materials from your doctor or pharmacy?: 1 - Never What is the last grade level you completed in school?: 10th grade  Interpreter Needed?: No  Information entered by :: Vilinda Blanks  Past Medical History:  Diagnosis Date  . Deep vein thrombosis (DVT) (HCC) 08/2016   Right leg  . Dry eyes 2010   on restasis - Dr. Jorja Loa   . Gout 2009  . Hyperlipidemia   . Hypertension   . Spinal stenosis    Past Surgical History:  Procedure Laterality Date  . CESAREAN SECTION    . COLONOSCOPY  04/09/2009   WUJ:WJXB  papilla otherwise, normal rectum/Left-sided diverticula/Cecal polyp status post cold snare and removal/The remainder of the colonic mucosa appeared normal. Adenomatous polyps, next TCS 03/2016 per RMR.  . COLONOSCOPY N/A 05/19/2016   Procedure: COLONOSCOPY;  Surgeon: Daneil Dolin, MD;  Location: [facility];  Service: Endoscopy;  Laterality: N/A;  1:00 PM   Family History  Problem Relation Age of Onset  . Stroke Mother   . Stroke Father   . Diabetes Sister   . Pulmonary embolism Sister   . Cirrhosis Brother   . Colon cancer Neg Hx    Social History   Socioeconomic History  . Marital status: Widowed    Spouse name: Not on file  . Number of children: 2  . Years of education: Not on file  . Highest education level: 10th grade  Occupational History  . Occupation: retired   Scientific laboratory technician  . Financial resource strain: Somewhat hard  . Food insecurity:    Worry: Never true    Inability: Never true  . Transportation needs:    Medical: No    Non-medical: No  Tobacco Use  . Smoking status: Former Smoker    Packs/day: 0.50    Years: 10.00    Pack years: 5.00    Types: Cigarettes    Last attempt to quit: 10/07/1983    Years since quitting: 34.2  .  Smokeless tobacco: Never Used  Substance and Sexual Activity  . Alcohol use: No  . Drug use: No  . Sexual activity: Never  Lifestyle  . Physical activity:    Days per week: 0 days    Minutes per session: 0 min  . Stress: Only a little  Relationships  . Social connections:    Talks on phone: Three times a week    Gets together: Once a week    Attends religious service: 1 to 4 times per year    Active member of club or organization: No    Attends meetings of clubs or organizations: Never    Relationship status: Widowed  Other Topics Concern  . Not on file  Social History Narrative  . Not on file    Outpatient Encounter Medications as of 12/20/2017  Medication Sig  . amLODipine (NORVASC) 10 MG tablet Take 1 tablet (10 mg  total) by mouth daily.  Marland Kitchen aspirin EC 81 MG tablet Take 81 mg by mouth daily.  Vladimir Faster Glycol-Propyl Glycol (SYSTANE) 0.4-0.3 % SOLN Apply 1 drop to eye 2 (two) times daily.  Marland Kitchen spironolactone (ALDACTONE) 50 MG tablet One tab daily  . HYDROcodone-acetaminophen (NORCO/VICODIN) 5-325 MG tablet Take 2 tablets by mouth every 4 (four) hours as needed for moderate pain. (Patient not taking: Reported on 12/20/2017)  . naproxen (NAPROSYN) 500 MG tablet Take 1 tablet (500 mg total) by mouth 2 (two) times daily with a meal. (Patient not taking: Reported on 11/08/2017)  . predniSONE (DELTASONE) 10 MG tablet Take 1 tablet (10 mg total) by mouth 2 (two) times daily with a meal. (Patient not taking: Reported on 12/20/2017)  . predniSONE (DELTASONE) 10 MG tablet Take 1 tablet (10 mg total) by mouth 2 (two) times daily with a meal. (Patient not taking: Reported on 12/20/2017)  . tiZANidine (ZANAFLEX) 2 MG tablet One tablet at bedtime as needed got muscle spasm (Patient not taking: Reported on 11/08/2017)   No facility-administered encounter medications on file as of 12/20/2017.     Activities of Daily Living In your present state of health, do you have any difficulty performing the following activities: 12/20/2017 02/14/2017  Hearing? N N  Vision? Y N  Comment sometimes eyes get blurry, especially out in the sunlight. -  Difficulty concentrating or making decisions? N N  Walking or climbing stairs? N N  Dressing or bathing? N N  Doing errands, shopping? N N  Preparing Food and eating ? N N  Using the Toilet? N N  In the past six months, have you accidently leaked urine? N N  Do you have problems with loss of bowel control? N N  Managing your Medications? N N  Managing your Finances? N N  Housekeeping or managing your Housekeeping? N N  Some recent data might be hidden    Patient Care Team: Fayrene Helper, MD as PCP - General Rourk, Cristopher Estimable, MD as Consulting Physician (Gastroenterology) Cassandria Anger, MD as Consulting Physician (Endocrinology) Florian Buff, MD as Consulting Physician (Obstetrics and Gynecology) Sydnee Cabal, MD as Consulting Physician (Orthopedic Surgery)    Assessment:   This is a routine wellness examination for [facility].  Exercise Activities and Dietary recommendations Current Exercise Habits: The patient does not participate in regular exercise at present, Exercise limited by: orthopedic condition(s)  Goals    . Exercise 3x per week (30 min per time)     Recommend starting a routine exercise program at least 3 days a  week for 30-45 minutes at a time as tolerated.         Fall Risk Fall Risk  12/20/2017 11/01/2017 06/23/2017 04/21/2017 02/14/2017  Falls in the past year? No No No No No  Risk for fall due to : - - - - -  Risk for fall due to: Comment - - - - -   Is the patient's home free of loose throw rugs in walkways, pet beds, electrical cords, etc?   yes      Grab bars in the bathroom? no      Handrails on the stairs?   yes      Adequate lighting?   yes   Depression Screen PHQ 2/9 Scores 12/20/2017 06/23/2017 04/21/2017 02/14/2017  PHQ - 2 Score 1 0 0 0  PHQ- 9 Score - - - -     Cognitive Function     6CIT Screen 12/20/2017 02/14/2017  What Year? 0 points 0 points  What month? 0 points 0 points  What time? 0 points 0 points  Count back from 20 0 points 0 points  Months in reverse 0 points 0 points  Repeat phrase 0 points 0 points  Total Score 0 0    Immunization History  Administered Date(s) Administered  . Influenza,inj,Quad PF,6+ Mos 06/12/2014, 08/21/2015, 07/08/2016, 06/23/2017  . Pneumococcal Conjugate-13 11/13/2014  . Pneumococcal Polysaccharide-23 02/17/2016  . Tdap 02/01/2011    Qualifies for Shingles Vaccine? Patient will call insurance company to see if it is a covered vaccine.  Screening Tests Health Maintenance  Topic Date Due  . INFLUENZA VACCINE  03/23/2018  . TETANUS/TDAP  01/31/2021  . DEXA SCAN   Completed  . PNA vac Low Risk Adult  Completed    Cancer Screenings: Lung: Low Dose CT Chest recommended if Age 71-80 years, 30 pack-year currently smoking OR have quit w/in 15years. Patient does not qualify. Breast:  Up to date on Mammogram? No  Has an appointment Dec 21, 2017 to have it completed Up to date of Bone Density/Dexa? Yes Colorectal: UTD     Plan:    I have personally reviewed and noted the following in the patient's chart:   . Medical and social history . Use of alcohol, tobacco or illicit drugs  . Current medications and supplements . Functional ability and status . Nutritional status . Physical activity . Advanced directives . List of other physicians . Hospitalizations, surgeries, and ER visits in previous 12 months . Vitals . Screenings to include cognitive, depression, and falls . Referrals and appointments  In addition, I have reviewed and discussed with patient certain preventive protocols, quality metrics, and best practice recommendations. A written personalized care plan for preventive services as well as general preventive health recommendations were provided to patient.     Tod Persia, [place]  12/20/2017

## 2017-12-21 ENCOUNTER — Ambulatory Visit (HOSPITAL_COMMUNITY)
Admission: RE | Admit: 2017-12-21 | Discharge: 2017-12-21 | Disposition: A | Discharge status: Home / Self Care | Payer: PPO | Source: Ambulatory Visit | Attending: Family Medicine | Admitting: Family Medicine

## 2017-12-21 DIAGNOSIS — Z1231 Encounter for screening mammogram for malignant neoplasm of breast: Secondary | ICD-10-CM | POA: Diagnosis not present

## 2017-12-21 NOTE — Telephone Encounter (Signed)
Pt aware.

## 2017-12-26 ENCOUNTER — Ambulatory Visit: Payer: Self-pay

## 2018-01-21 LAB — GLUCOSE, POCT (MANUAL RESULT ENTRY): POC Glucose: 93 mg/dl (ref 70–99)

## 2018-04-04 ENCOUNTER — Encounter: Payer: Self-pay | Admitting: "Endocrinology

## 2018-04-07 ENCOUNTER — Ambulatory Visit: Payer: PPO | Admitting: Orthopedic Surgery

## 2018-04-07 ENCOUNTER — Encounter: Payer: Self-pay | Admitting: Orthopedic Surgery

## 2018-04-07 ENCOUNTER — Encounter

## 2018-04-07 VITALS — BP 147/86 | HR 110 | Ht 63.5 in | Wt 159.0 lb

## 2018-04-07 DIAGNOSIS — M1712 Unilateral primary osteoarthritis, left knee: Secondary | ICD-10-CM | POA: Diagnosis not present

## 2018-04-07 MED ORDER — DICLOFENAC POTASSIUM 50 MG PO TABS: 50.0000 mg | ORAL_TABLET | Freq: Two times a day (BID) | ORAL | 3 refills | Status: DC

## 2018-04-07 NOTE — Progress Notes (Signed)
Chief Complaint  Patient presents with  . Follow-up    Bilat knee pain Left knee worse    BP (!) 147/86   Pulse (!) 110   Ht 5' 3.5" (1.613 m)   Wt 159 lb (72.1 kg)   BMI 27.50 kg/m   77 year old female with osteoarthritis of both knees left worse than right complains of pain she is failed treatment with naproxen and rest and activity modification her weight is fine.  She is noted increased pain in the lateral side and medial side of her left knee with associated swelling the pain can be severe is primarily moderate seems to be worse at the end of the day best described as a dull ache  Review of Systems  Musculoskeletal: Positive for back pain.  Neurological: Negative for weakness.   Past Medical History:  Diagnosis Date  . Deep vein thrombosis (DVT) (HCC) 08/2016   Right leg  . Dry eyes 2010   on restasis - Dr. Jorja Loa   . Gout 2009  . Hyperlipidemia   . Hypertension   . Spinal stenosis     BP (!) 147/86   Pulse (!) 110   Ht 5' 3.5" (1.613 m)   Wt 159 lb (72.1 kg)   BMI 27.72 kg/m  Physical Exam  Constitutional: She is oriented to person, place, and time. She appears well-developed and well-nourished.  Musculoskeletal:  Ambulatory status independent Cadence decreased Stride length decreased  Neurological: She is alert and oriented to person, place, and time.  Psychiatric: She has a normal mood and affect. Judgment normal.  Vitals reviewed.   Left knee overall alignment is fairly normal.  Medial lateral joint line is tender small effusion flexion arc is 115 degrees knee is stable strength is normal skin is intact without rash pulse and perfusion are normal mild distal edema sensation intact  Right knee painful to range of motion mild restriction range of motion no tenderness knee is stable  Encounter Diagnosis  Name Primary?  . Primary osteoarthritis of left knee Yes    Procedure note injection and aspiration left knee joint  Verbal consent was obtained to  aspirate and inject the left knee joint   Timeout was completed to confirm the site of aspiration and injection  An 18-gauge needle was used to aspirate the left knee joint from a suprapatellar lateral approach.  The medications used were 40 mg of Depo-Medrol and 1% lidocaine 3 cc  Anesthesia was provided by ethyl chloride and the skin was prepped with alcohol.  After cleaning the skin with alcohol an 18-gauge needle was used to aspirate the right knee joint.  We obtained 5 cc of fluid  We followed this by injection of 40 mg of Depo-Medrol and 3 cc 1% lidocaine.  There were no complications. A sterile bandage was applied.  Meds ordered this encounter  Medications  . diclofenac (CATAFLAM) 50 MG tablet    Sig: Take 1 tablet (50 mg total) by mouth 2 (two) times daily.    Dispense:  90 tablet    Refill:  3    Recheck in 6 months for x-ray

## 2018-04-14 ENCOUNTER — Other Ambulatory Visit: Payer: Self-pay | Admitting: "Endocrinology

## 2018-04-14 DIAGNOSIS — E213 Hyperparathyroidism, unspecified: Secondary | ICD-10-CM

## 2018-04-14 DIAGNOSIS — E559 Vitamin D deficiency, unspecified: Secondary | ICD-10-CM

## 2018-04-17 DIAGNOSIS — E213 Hyperparathyroidism, unspecified: Secondary | ICD-10-CM | POA: Diagnosis not present

## 2018-04-17 DIAGNOSIS — E559 Vitamin D deficiency, unspecified: Secondary | ICD-10-CM | POA: Diagnosis not present

## 2018-04-18 ENCOUNTER — Ambulatory Visit (INDEPENDENT_AMBULATORY_CARE_PROVIDER_SITE_OTHER): Payer: PPO | Admitting: Family Medicine

## 2018-04-18 ENCOUNTER — Encounter: Payer: Self-pay | Admitting: Family Medicine

## 2018-04-18 VITALS — BP 138/80 | HR 104 | Resp 16 | Ht 63.5 in | Wt 160.0 lb

## 2018-04-18 DIAGNOSIS — Z23 Encounter for immunization: Secondary | ICD-10-CM

## 2018-04-18 DIAGNOSIS — E7800 Pure hypercholesterolemia, unspecified: Secondary | ICD-10-CM

## 2018-04-18 DIAGNOSIS — Z0000 Encounter for general adult medical examination without abnormal findings: Secondary | ICD-10-CM | POA: Diagnosis not present

## 2018-04-18 DIAGNOSIS — I10 Essential (primary) hypertension: Secondary | ICD-10-CM

## 2018-04-18 LAB — VITAMIN D 25 HYDROXY (VIT D DEFICIENCY, FRACTURES): VIT D 25 HYDROXY: 34 ng/mL (ref 30–100)

## 2018-04-18 LAB — PTH, INTACT AND CALCIUM
Calcium: 10.5 mg/dL — ABNORMAL HIGH (ref 8.6–10.4)
PTH: 59 pg/mL (ref 14–64)

## 2018-04-18 NOTE — Patient Instructions (Addendum)
F/U in early January, call if you need me sooner  Flu vaccine today   Labs today cBC, cmp and eGFR, lipid and TSH ( [facility])  Please return 3 stool cards next week   No changes in your current medications   I hope that you get your medication for knees today.  Be safe, be careful not to fall  Thank you  for choosing [facility]. We consider it a privelige to serve you.  Delivering excellent health care in a caring and  compassionate way is our goal.  Partnering with you,  so that together we can achieve this goal is our strategy.

## 2018-04-18 NOTE — Progress Notes (Signed)
    Kristen Cox     MRN: [id number]      DOB: Feb 09, 1941  HPI: Patient is in for annual physical exam. Immunization is reviewed , and  updated if needed.   PE: BP 138/80   Pulse (!) 104   Resp 16   Ht 5' 3.5" (1.613 m)   Wt 160 lb (72.6 kg)   SpO2 94%   BMI 27.90 kg/m   Pleasant  female, alert and oriented x 3, in no cardio-pulmonary distress. Afebrile. HEENT No facial trauma or asymetry. Sinuses non tender.  Extra occullar muscles intact,  External ears normal, tympanic membranes clear. Oropharynx moist, no exudate. Neck: supple, no adenopathy,JVD or thyromegaly.No bruits.  Chest: Clear to ascultation bilaterally.No crackles or wheezes. Non tender to palpation  Breast: No asymetry,no masses or lumps. No tenderness. No nipple discharge or inversion. No axillary or supraclavicular adenopathy  Cardiovascular system; Heart sounds normal,  S1 and  S2 ,no S3.  No murmur, or thrill. Apical beat not displaced Peripheral pulses normal.  Abdomen: Soft, non tender, no organomegaly or masses. No bruits. Bowel sounds normal. No guarding, tenderness or rebound.  Rectal:  Stool cards x 3 to be returned GU: Asymptomatic, no exam indicated  Musculoskeletal exam: Decreased  ROM of spine,  and knees.Adequate in shoulders and hips  deformity ,swelling and  crepitus noted.in knees No muscle wasting or atrophy.   Neurologic: Cranial nerves 2 to 12 intact. Power, tone ,sensation  normal throughout.  No tremor.  Skin: Intact, no ulceration, erythema , scaling or rash noted. Pigmentation normal throughout  Psych; Normal mood and affect. Judgement and concentration normal   Assessment & Plan:  Annual physical exam Annual exam as documented. Counseling done  re healthy lifestyle involving commitment to 150 minutes exercise per week, heart healthy diet, and attaining healthy weight.The importance of adequate sleep also discussed. Regular seat belt use and home safety,  is also discussed. Changes in health habits are decided on by the patient with goals and time frames  set for achieving them. Immunization and cancer screening needs are specifically addressed at this visit.

## 2018-04-18 NOTE — Assessment & Plan Note (Signed)
After obtaining informed consent, the vaccine is  administered by LPN.  

## 2018-04-18 NOTE — Assessment & Plan Note (Signed)

## 2018-04-19 LAB — CBC
HCT: 36.9 % (ref 35.0–45.0)
Hemoglobin: 12.5 g/dL (ref 11.7–15.5)
MCH: 30.1 pg (ref 27.0–33.0)
MCHC: 33.9 g/dL (ref 32.0–36.0)
MCV: 88.9 fL (ref 80.0–100.0)
MPV: 9.7 fL (ref 7.5–12.5)
Platelets: 289 10*3/uL (ref 140–400)
RBC: 4.15 10*6/uL (ref 3.80–5.10)
RDW: 13.2 % (ref 11.0–15.0)
WBC: 6 10*3/uL (ref 3.8–10.8)

## 2018-04-19 LAB — COMPLETE METABOLIC PANEL WITH GFR
AG Ratio: 1.5 (calc) (ref 1.0–2.5)
ALT: 11 U/L (ref 6–29)
AST: 19 U/L (ref 10–35)
Albumin: 4.3 g/dL (ref 3.6–5.1)
Alkaline phosphatase (APISO): 59 U/L (ref 33–130)
BUN/Creatinine Ratio: 22 (calc) (ref 6–22)
BUN: 22 mg/dL (ref 7–25)
CO2: 25 mmol/L (ref 20–32)
Calcium: 10.5 mg/dL — ABNORMAL HIGH (ref 8.6–10.4)
Chloride: 103 mmol/L (ref 98–110)
Creat: 0.98 mg/dL — ABNORMAL HIGH (ref 0.60–0.93)
GFR, Est African American: 65 mL/min/{1.73_m2} (ref >=60)
GFR, Est Non African American: 56 mL/min/{1.73_m2} — ABNORMAL LOW (ref >=60)
Globulin: 2.9 g/dL (calc) (ref 1.9–3.7)
Glucose, Bld: 99 mg/dL (ref 65–99)
Potassium: 3.9 mmol/L (ref 3.5–5.3)
Sodium: 137 mmol/L (ref 135–146)
Total Bilirubin: 0.4 mg/dL (ref 0.2–1.2)
Total Protein: 7.2 g/dL (ref 6.1–8.1)

## 2018-04-19 LAB — TSH: TSH: 1.13 mIU/L (ref 0.40–4.50)

## 2018-04-19 LAB — LIPID PANEL
Cholesterol: 207 mg/dL — ABNORMAL HIGH (ref <200)
HDL: 48 mg/dL — ABNORMAL LOW (ref >50)
LDL Cholesterol (Calc): 134 mg/dL (calc) — ABNORMAL HIGH
Non-HDL Cholesterol (Calc): 159 mg/dL (calc) — ABNORMAL HIGH (ref <130)
Total CHOL/HDL Ratio: 4.3 (calc) (ref <5.0)
Triglycerides: 140 mg/dL (ref <150)

## 2018-04-21 ENCOUNTER — Ambulatory Visit: Payer: PPO | Admitting: "Endocrinology

## 2018-04-26 ENCOUNTER — Encounter: Payer: Self-pay | Admitting: "Endocrinology

## 2018-04-26 ENCOUNTER — Other Ambulatory Visit: Payer: Self-pay

## 2018-04-26 ENCOUNTER — Ambulatory Visit (INDEPENDENT_AMBULATORY_CARE_PROVIDER_SITE_OTHER): Payer: PPO | Admitting: "Endocrinology

## 2018-04-26 MED ORDER — CHOLECALCIFEROL 50 MCG (2000 UT) PO CAPS: 1.0000 | ORAL_CAPSULE | Freq: Every day | ORAL | 6 refills | Status: DC

## 2018-04-26 NOTE — Progress Notes (Signed)
Endocrinology follow-up note   Subjective:    Patient ID: Kristen Cox, female    DOB: 1940-10-11, PCP Fayrene Helper, MD   Past Medical History:  Diagnosis Date  . Deep vein thrombosis (DVT) (HCC) 08/2016   Right leg  . Dry eyes 2010   on restasis - Dr. Jorja Loa   . Gout 2009  . Hyperlipidemia   . Hypertension   . Spinal stenosis    Past Surgical History:  Procedure Laterality Date  . CESAREAN SECTION    . COLONOSCOPY  04/09/2009   NGE:XBMW papilla otherwise, normal rectum/Left-sided diverticula/Cecal polyp status post cold snare and removal/The remainder of the colonic mucosa appeared normal. Adenomatous polyps, next TCS 03/2016 per RMR.  . COLONOSCOPY N/A 05/19/2016   Procedure: COLONOSCOPY;  Surgeon: Daneil Dolin, MD;  Location: [facility];  Service: Endoscopy;  Laterality: N/A;  1:00 PM   Social History   Socioeconomic History  . Marital status: Widowed    Spouse name: Not on file  . Number of children: 2  . Years of education: Not on file  . Highest education level: 10th grade  Occupational History  . Occupation: retired   Scientific laboratory technician  . Financial resource strain: Somewhat hard  . Food insecurity:    Worry: Never true    Inability: Never true  . Transportation needs:    Medical: No    Non-medical: No  Tobacco Use  . Smoking status: Former Smoker    Packs/day: 0.50    Years: 10.00    Pack years: 5.00    Types: Cigarettes    Last attempt to quit: 10/07/1983    Years since quitting: 34.5  . Smokeless tobacco: Never Used  Substance and Sexual Activity  . Alcohol use: No  . Drug use: No  . Sexual activity: Never  Lifestyle  . Physical activity:    Days per week: 0 days    Minutes per session: 0 min  . Stress: Only a little  Relationships  . Social connections:    Talks on phone: Three times a week    Gets together: Once a week    Attends religious service: 1 to 4 times per year    Active member of club or organization: No    Attends  meetings of clubs or organizations: Never    Relationship status: Widowed  Other Topics Concern  . Not on file  Social History Narrative  . Not on file   Outpatient Encounter Medications as of 04/26/2018  Medication Sig  . Biotin w/ Vitamins C & E (HAIR SKIN & NAILS GUMMIES PO) Take by mouth daily.  . [DISCONTINUED] Calcium Carbonate-Vitamin D (CALCIUM 600+D) 600-400 MG-UNIT tablet Take 1 tablet by mouth daily.  Marland Kitchen amLODipine (NORVASC) 10 MG tablet Take 1 tablet (10 mg total) by mouth daily.  Marland Kitchen aspirin EC 81 MG tablet Take 81 mg by mouth daily.  . cetirizine (ZYRTEC) 10 MG tablet Take 10 mg by mouth daily.  . Cholecalciferol 2000 units CAPS Take 1 capsule (2,000 Units total) by mouth daily with breakfast.  . diclofenac (CATAFLAM) 50 MG tablet Take 1 tablet (50 mg total) by mouth 2 (two) times daily.  Marland Kitchen gabapentin (NEURONTIN) 100 MG capsule One capsule at bedtime for back pain  . Polyethyl Glycol-Propyl Glycol (SYSTANE) 0.4-0.3 % SOLN Apply 1 drop to eye 2 (two) times daily.  Marland Kitchen spironolactone (ALDACTONE) 50 MG tablet One tab daily  . [DISCONTINUED] calcium-vitamin D (OSCAL WITH D) 500-200 MG-UNIT  tablet Take 1 tablet by mouth daily with breakfast.   No facility-administered encounter medications on file as of 04/26/2018.    ALLERGIES: Allergies  Allergen Reactions  . Benazepril Other (See Comments)    Patient does not recall being on this medication and/or what reaction she might have had.  . Metoprolol     CRAMPS   VACCINATION STATUS: Immunization History  Administered Date(s) Administered  . Influenza,inj,Quad PF,6+ Mos 06/12/2014, 08/21/2015, 07/08/2016, 06/23/2017, 04/18/2018  . Pneumococcal Conjugate-13 11/13/2014  . Pneumococcal Polysaccharide-23 02/17/2016  . Tdap 02/01/2011    HPI  77 year old female with medical hx as follows. She is being seen in follow-up for hypercalcemia.   -Her previsit labs show calcium 10.5 slightly above target associated with high normal PTH of  59.  During her prior visits PTH remain between 37 and 75.3.   Her prior 24 hour urine calcium has been low at 44,  and was WNL at 118 last check. She denies family hx of calcium problem. She denies any hx of CVA, seizures, nephrolithiasis, nor CKD. she denies Osteoporosis. she is a former smoker.  She did not have bone density since 2015. she denies any new complaints.  Review of Systems  Constitutional: has steady weight, no subjective hyperthermia/hypothermia Eyes: no blurry vision, no xerophthalmia ENT: no sore throat, no nodules palpated in throat, no dysphagia/odynophagia, no hoarseness Cardiovascular: no chest pain, no palpitations.   Respiratory: no cough/SOB Gastrointestinal: no N/V/D/C Musculoskeletal: no muscle/joint aches Skin: no rashes Neurological: no tremors/numbness/tingling/dizziness Psychiatric: no depression/anxiety   Objective:    BP 133/82   Pulse (!) 101   Ht 5' 3.5" (1.613 m)   Wt 163 lb (73.9 kg)   BMI 28.42 kg/m   Wt Readings from Last 3 Encounters:  04/26/18 163 lb (73.9 kg)  04/18/18 160 lb (72.6 kg)  04/07/18 159 lb (72.1 kg)    Physical Exam Constitutional: slightly overweight, in NAD Eyes: PERRLA, EOMI, no exophthalmos ENT: moist mucous membranes, no thyromegaly, no cervical lymphadenopathy Musculoskeletal: no deformities, strength intact in all 4 Skin: moist, warm, no rashes Neurological: no tremor with outstretched hands  Diabetic Labs (most recent): Lab Results  Component Value Date   HGBA1C 5.6 07/01/2016   HGBA1C 6.1 (H) 10/30/2015   HGBA1C 5.9 (H) 03/19/2015    Lipid Panel     Component Value Date/Time   CHOL 207 (H) 04/18/2018 1049   TRIG 140 04/18/2018 1049   HDL 48 (L) 04/18/2018 1049   CHOLHDL 4.3 04/18/2018 1049   VLDL 19 02/11/2016 0718   LDLCALC 134 (H) 04/18/2018 1049   Recent Results (from the past 2160 hour(s))  Vitamin D, 25-hydroxy     Status: None   Collection Time: 04/17/18  9:26 AM  Result Value Ref Range    Vit D, 25-Hydroxy 34 30 - 100 ng/mL    Comment: Vitamin D Status         25-OH Vitamin D: . Deficiency:                    <20 ng/mL Insufficiency:             20 - 29 ng/mL Optimal:                 > or = 30 ng/mL . For 25-OH Vitamin D testing on patients on  D2-supplementation and patients for whom quantitation  of D2 and D3 fractions is required, the QuestAssureD(TM) 25-OH VIT D, (D2,D3), LC/MS/MS is recommended:  order  code [phone] (patients >12yrs). . For more information on this test, go to: http://education.questdiagnostics.com/faq/FAQ163 (This link is being provided for  informational/educational purposes only.)   PTH, Intact and Calcium     Status: Abnormal   Collection Time: 04/17/18  9:26 AM  Result Value Ref Range   PTH 59 14 - 64 pg/mL    Comment: . Interpretive Guide    Intact PTH           Calcium ------------------    ----------           ------- Normal Parathyroid    Normal               Normal Hypoparathyroidism    Low or Low Normal    Low Hyperparathyroidism    Primary            Normal or High       High    Secondary          High                 Normal or Low    Tertiary           High                 High Non-Parathyroid    Hypercalcemia      Low or Low Normal    High .    Calcium 10.5 (H) 8.6 - 10.4 mg/dL  CBC     Status: None   Collection Time: 04/18/18 10:49 AM  Result Value Ref Range   WBC 6.0 3.8 - 10.8 Thousand/uL   RBC 4.15 3.80 - 5.10 Million/uL   Hemoglobin 12.5 11.7 - 15.5 g/dL   HCT 36.9 35.0 - 45.0 %   MCV 88.9 80.0 - 100.0 fL   MCH 30.1 27.0 - 33.0 pg   MCHC 33.9 32.0 - 36.0 g/dL   RDW 13.2 11.0 - 15.0 %   Platelets 289 140 - 400 Thousand/uL   MPV 9.7 7.5 - 12.5 fL  COMPLETE METABOLIC PANEL WITH GFR     Status: Abnormal   Collection Time: 04/18/18 10:49 AM  Result Value Ref Range   Glucose, Bld 99 65 - 99 mg/dL    Comment: .            Fasting reference interval .    BUN 22 7 - 25 mg/dL   Creat 0.98 (H) 0.60 - 0.93 mg/dL     Comment: For patients >65 years of age, the reference limit for Creatinine is approximately 13% higher for people identified as African-American. .    GFR, Est Non African American 56 (L) > OR = 60 mL/min/1.23m2   GFR, Est African American 65 > OR = 60 mL/min/1.13m2   BUN/Creatinine Ratio 22 6 - 22 (calc)   Sodium 137 135 - 146 mmol/L   Potassium 3.9 3.5 - 5.3 mmol/L   Chloride 103 98 - 110 mmol/L   CO2 25 20 - 32 mmol/L   Calcium 10.5 (H) 8.6 - 10.4 mg/dL   Total Protein 7.2 6.1 - 8.1 g/dL   Albumin 4.3 3.6 - 5.1 g/dL   Globulin 2.9 1.9 - 3.7 g/dL (calc)   AG Ratio 1.5 1.0 - 2.5 (calc)   Total Bilirubin 0.4 0.2 - 1.2 mg/dL   Alkaline phosphatase (APISO) 59 33 - 130 U/L   AST 19 10 - 35 U/L   ALT 11 6 - 29 U/L  Lipid panel  Status: Abnormal   Collection Time: 04/18/18 10:49 AM  Result Value Ref Range   Cholesterol 207 (H) <200 mg/dL   HDL 48 (L) >50 mg/dL   Triglycerides 140 <150 mg/dL   LDL Cholesterol (Calc) 134 (H) mg/dL (calc)    Comment: Reference range: <100 . Desirable range <100 mg/dL for primary prevention;   <70 mg/dL for patients with CHD or diabetic patients  with > or = 2 CHD risk factors. Marland Kitchen LDL-C is now calculated using the Martin-Hopkins  calculation, which is a validated novel method providing  better accuracy than the Friedewald equation in the  estimation of LDL-C.  Cresenciano Genre et al. Annamaria Helling. 5400;867(61): 2061-2068  (http://education.[facility].com/faq/FAQ164)    Total CHOL/HDL Ratio 4.3 <5.0 (calc)   Non-HDL Cholesterol (Calc) 159 (H) <130 mg/dL (calc)    Comment: For patients with diabetes plus 1 major ASCVD risk  factor, treating to a non-HDL-C goal of <100 mg/dL  (LDL-C of <70 mg/dL) is considered a therapeutic  option.   TSH     Status: None   Collection Time: 04/18/18 10:49 AM  Result Value Ref Range   TSH 1.13 0.40 - 4.50 mIU/L    Assessment & Plan:   1. Hypercalcemia Her repeat work up is reviewed. Her repeat PTH is stable at 59  associated with slightly above target calcium of 10.5.  This is still consistent with early mild hyperparathyroidism.  I discussed the potential complications of untreated hypercalcemia.  He will not require immediate intervention at this time.   Her prior  24 hour urine calcium was low at 44, and 118 on 2 separate occasions. -She will be continued on observation with plan to repeat 24-hour urine calcium/creatinine measurement, PTH/calcium, and repeat bone density and office visit in 6 months.  -I advised her to discontinue her daily supplements of vitamin D/calcium, and replace it with plain vitamin D 2000 units daily.  - I advised patient to maintain close follow up with Fayrene Helper, MD for primary care needs. Follow up plan: Return in about 6 months (around 10/25/2018) for Follow up with Pre-visit Labs, Follow up with Bone Density.  Glade Lloyd, MD Phone: [phone]  Fax: [phone]  This note was partially dictated with voice recognition software. Similar sounding words can be transcribed inadequately or may not  be corrected upon review.  04/26/2018, 12:57 PM

## 2018-05-03 ENCOUNTER — Ambulatory Visit (HOSPITAL_COMMUNITY)
Admission: RE | Admit: 2018-05-03 | Discharge: 2018-05-03 | Disposition: A | Discharge status: Home / Self Care | Payer: PPO | Source: Ambulatory Visit | Attending: "Endocrinology | Admitting: "Endocrinology

## 2018-05-03 DIAGNOSIS — Z888 Allergy status to other drugs, medicaments and biological substances status: Secondary | ICD-10-CM | POA: Diagnosis not present

## 2018-05-03 DIAGNOSIS — Z780 Asymptomatic menopausal state: Secondary | ICD-10-CM | POA: Diagnosis not present

## 2018-05-03 DIAGNOSIS — M109 Gout, unspecified: Secondary | ICD-10-CM | POA: Insufficient documentation

## 2018-05-03 DIAGNOSIS — I10 Essential (primary) hypertension: Secondary | ICD-10-CM | POA: Diagnosis not present

## 2018-05-03 DIAGNOSIS — Z86718 Personal history of other venous thrombosis and embolism: Secondary | ICD-10-CM | POA: Insufficient documentation

## 2018-05-03 DIAGNOSIS — Z87891 Personal history of nicotine dependence: Secondary | ICD-10-CM | POA: Insufficient documentation

## 2018-05-03 DIAGNOSIS — M4800 Spinal stenosis, site unspecified: Secondary | ICD-10-CM | POA: Diagnosis not present

## 2018-05-03 DIAGNOSIS — E785 Hyperlipidemia, unspecified: Secondary | ICD-10-CM | POA: Insufficient documentation

## 2018-05-03 DIAGNOSIS — Z79899 Other long term (current) drug therapy: Secondary | ICD-10-CM | POA: Diagnosis not present

## 2018-05-10 DIAGNOSIS — H2513 Age-related nuclear cataract, bilateral: Secondary | ICD-10-CM | POA: Diagnosis not present

## 2018-05-10 DIAGNOSIS — H47292 Other optic atrophy, left eye: Secondary | ICD-10-CM | POA: Diagnosis not present

## 2018-05-10 DIAGNOSIS — H31012 Macula scars of posterior pole (postinflammatory) (post-traumatic), left eye: Secondary | ICD-10-CM | POA: Diagnosis not present

## 2018-05-10 DIAGNOSIS — H5211 Myopia, right eye: Secondary | ICD-10-CM | POA: Diagnosis not present

## 2018-05-10 DIAGNOSIS — H52221 Regular astigmatism, right eye: Secondary | ICD-10-CM | POA: Diagnosis not present

## 2018-05-10 DIAGNOSIS — H524 Presbyopia: Secondary | ICD-10-CM | POA: Diagnosis not present

## 2018-05-11 ENCOUNTER — Other Ambulatory Visit (INDEPENDENT_AMBULATORY_CARE_PROVIDER_SITE_OTHER): Payer: PPO

## 2018-05-11 DIAGNOSIS — Z1211 Encounter for screening for malignant neoplasm of colon: Secondary | ICD-10-CM | POA: Diagnosis not present

## 2018-05-11 LAB — HEMOCCULT GUIAC POC 1CARD (OFFICE)
Card #2 Fecal Occult Blod, POC: NEGATIVE
Card #3 Fecal Occult Blood, POC: NEGATIVE
Fecal Occult Blood, POC: NEGATIVE

## 2018-07-14 ENCOUNTER — Other Ambulatory Visit: Payer: Self-pay | Admitting: Family Medicine

## 2018-08-11 ENCOUNTER — Telehealth: Payer: Self-pay | Admitting: Family Medicine

## 2018-08-11 NOTE — Telephone Encounter (Signed)
Please advise. Again patient aware MD unavailable so there is no guarantee of response.

## 2018-08-11 NOTE — Telephone Encounter (Signed)
Both Ankles are cramping -kept pt up most of the night, she wants something called in. I suggested that she go to the Urgent care, or the Hospital, as Dr Moshe Cipro was unavailable but she wanted me to send a message.  Pt states that Ankles have hurt in the past, but was hurting so bad last night it kept her up.

## 2018-08-11 NOTE — Telephone Encounter (Signed)
Spoke with patient and advised her that it is recommended she be evaluated by urgent care or the ED with verbal understanding.

## 2018-08-11 NOTE — Telephone Encounter (Signed)
I recommend clinical evaluation as you told her

## 2018-08-31 ENCOUNTER — Telehealth: Payer: Self-pay

## 2018-08-31 DIAGNOSIS — I10 Essential (primary) hypertension: Secondary | ICD-10-CM

## 2018-08-31 DIAGNOSIS — E7800 Pure hypercholesterolemia, unspecified: Secondary | ICD-10-CM

## 2018-08-31 LAB — LIPID PANEL
CHOL/HDL RATIO: 5 (calc) — AB (ref <5.0)
CHOLESTEROL: 209 mg/dL — AB (ref <200)
HDL: 42 mg/dL — ABNORMAL LOW (ref >50)
LDL CHOLESTEROL (CALC): 139 mg/dL — AB
NON-HDL CHOLESTEROL (CALC): 167 mg/dL — AB (ref <130)
Triglycerides: 149 mg/dL (ref <150)

## 2018-08-31 LAB — BASIC METABOLIC PANEL WITH GFR
BUN/Creatinine Ratio: 25 (calc) — ABNORMAL HIGH (ref 6–22)
BUN: 25 mg/dL (ref 7–25)
CALCIUM: 10.4 mg/dL (ref 8.6–10.4)
CO2: 24 mmol/L (ref 20–32)
CREATININE: 1 mg/dL — AB (ref 0.60–0.93)
Chloride: 99 mmol/L (ref 98–110)
GFR, EST NON AFRICAN AMERICAN: 54 mL/min/{1.73_m2} — AB (ref >=60)
GFR, Est African American: 63 mL/min/{1.73_m2} (ref >=60)
Glucose, Bld: 97 mg/dL (ref 65–99)
Potassium: 4.6 mmol/L (ref 3.5–5.3)
SODIUM: 134 mmol/L — AB (ref 135–146)

## 2018-08-31 NOTE — Telephone Encounter (Signed)
Lab order given

## 2018-09-06 ENCOUNTER — Ambulatory Visit (INDEPENDENT_AMBULATORY_CARE_PROVIDER_SITE_OTHER): Payer: PPO | Admitting: Family Medicine

## 2018-09-06 ENCOUNTER — Encounter: Payer: Self-pay | Admitting: Family Medicine

## 2018-09-06 VITALS — BP 148/80 | HR 103 | Temp 99.2°F | Resp 15 | Ht 64.0 in | Wt 157.0 lb

## 2018-09-06 DIAGNOSIS — I10 Essential (primary) hypertension: Secondary | ICD-10-CM | POA: Diagnosis not present

## 2018-09-06 DIAGNOSIS — M609 Myositis, unspecified: Secondary | ICD-10-CM

## 2018-09-06 DIAGNOSIS — M62838 Other muscle spasm: Secondary | ICD-10-CM

## 2018-09-06 DIAGNOSIS — J309 Allergic rhinitis, unspecified: Secondary | ICD-10-CM | POA: Diagnosis not present

## 2018-09-06 DIAGNOSIS — M79645 Pain in left finger(s): Secondary | ICD-10-CM | POA: Diagnosis not present

## 2018-09-06 DIAGNOSIS — E7800 Pure hypercholesterolemia, unspecified: Secondary | ICD-10-CM | POA: Diagnosis not present

## 2018-09-06 MED ORDER — PREDNISONE 5 MG PO TABS: ORAL_TABLET | ORAL | 0 refills | Status: DC

## 2018-09-06 MED ORDER — GABAPENTIN 100 MG PO CAPS: ORAL_CAPSULE | ORAL | 5 refills | Status: DC

## 2018-09-06 MED ORDER — MONTELUKAST SODIUM 10 MG PO TABS: 10.0000 mg | ORAL_TABLET | Freq: Every day | ORAL | 3 refills | Status: DC

## 2018-09-06 MED ORDER — TIZANIDINE HCL 2 MG PO TABS: 2.0000 mg | ORAL_TABLET | Freq: Every day | ORAL | 2 refills | Status: DC

## 2018-09-06 MED ORDER — METHYLPREDNISOLONE ACETATE 80 MG/ML IJ SUSP
80.0000 mg | Freq: Once | INTRAMUSCULAR | Status: AC
  Administered 2018-09-06: 80 mg via INTRAMUSCULAR

## 2018-09-06 NOTE — Patient Instructions (Addendum)
F/U in 8 to 10 weeks, call if you need me sooner  Kidney function is improved  Depomedrol 80 mgIM in office today  Prednisone short term for joint swelling and you are referred to Dr Aline Brochure for swollen left thumb  You are referred to Rheumatologist tin Pitman for evaluation  New for muscle cramp at night is Zanaflex  New for allergies is Singulair  Hope you feel better  Thank you  for choosing [facility]. We consider it a privelige to serve you.  Delivering excellent health care in a caring and  compassionate way is our goal.  Partnering with you,  so that together we can achieve this goal is our strategy.

## 2018-09-06 NOTE — Assessment & Plan Note (Signed)
Acute pain and swelling of left thumb x 2 days, short course prednisone and refer to Ortho

## 2018-09-12 DIAGNOSIS — M62838 Other muscle spasm: Secondary | ICD-10-CM | POA: Insufficient documentation

## 2018-09-12 DIAGNOSIS — M609 Myositis, unspecified: Secondary | ICD-10-CM | POA: Insufficient documentation

## 2018-09-12 NOTE — Assessment & Plan Note (Signed)
Hyperlipidemia:Low fat diet discussed and encouraged.   Lipid Panel  Lab Results  Component Value Date   CHOL 209 (H) 08/31/2018   HDL 42 (L) 08/31/2018   LDLCALC 139 (H) 08/31/2018   TRIG 149 08/31/2018   CHOLHDL 5.0 (H) 08/31/2018   Needs to reduce fried and fatty foods

## 2018-09-12 NOTE — Assessment & Plan Note (Signed)
Adequate control, no change in medication DASH diet and commitment to daily physical activity for a minimum of 30 minutes discussed and encouraged, as a part of hypertension management. The importance of attaining a healthy weight is also discussed.  BP/Weight 09/06/2018 04/26/2018 04/18/2018 04/07/2018 01/21/2018 12/20/2017 1/74/9449  Systolic BP 675 916 384 665 993 570 177  Diastolic BP 80 82 80 86 78 82 87  Wt. (Lbs) 157 163 160 159 - 159.04 158  BMI 26.95 28.42 27.9 27.72 - 27.73 27.99

## 2018-09-12 NOTE — Progress Notes (Signed)
   FRANKYE SCHWEGEL     MRN: [id number]      DOB: 12/09/1940   HPI Ms. Kristen Cox is here for follow up and re-evaluation of chronic medical conditions, medication management and review of any available recent lab and radiology data.  Preventive health is updated, specifically  Cancer screening and Immunization.   Questions or concerns regarding consultations or procedures which the PT has had in the interim are  addressed. The PT denies any adverse reactions to current medications since the last visit.  C/o uncontrolled sinus allergy Drainage , stopped zyrtec as she feels it is ineffective C/o severe muscle cramps which are disabling esp at night  ROS Denies recent fever or chills. Denies chest pains, palpitations and leg swelling Denies abdominal pain, nausea, vomiting,diarrhea or constipation.   Denies dysuria, frequency, hesitancy or incontinence.  Denies headaches, seizures, numbness, or tingling. Denies depression, anxiety or insomnia. Denies skin break down or rash.   PE  BP (!) 148/80   Pulse (!) 103   Temp 99.2 F (37.3 C) (Oral)   Resp 15   Ht 5\' 4"  (1.626 m)   Wt 157 lb (71.2 kg)   SpO2 97%   BMI 26.95 kg/m   Patient alert and oriented and in no cardiopulmonary distress.Pt inpain  HEENT: No facial asymmetry, EOMI,   oropharynx pink and moist.  Neck supple no JVD, no mass.Erythema and edema of nasal mucosa with excess clear drainage  Chest: Clear to auscultation bilaterally.  CVS: S1, S2 no murmurs, no S3.Regular rate.  ABD: Soft non tender.   Ext: No edema  MS: Decreased  ROM spine, shoulders, hips and knees.Left thumb base swollen , tender with reduced ROM Tender to palpation over multiple joints with marked joint deformity ef digits  Skin: Intact, no ulcerations or rash noted.  Psych: Good eye contact, normal affect. Memory intact not anxious or depressed appearing.  CNS: CN 2-12 intact, power,  normal throughout.no focal deficits noted.   Assessment &  Plan  Thumb pain, left Acute pain and swelling of left thumb x 2 days, short course prednisone and refer to Ortho  Myositis of multiple sites Tenderness to palpation of multiple muscles and joints, with joint deformity, esp of digits, depo medrol and oral prednisone and eval by Rheumatology  Essential hypertension Adequate control, no change in medication DASH diet and commitment to daily physical activity for a minimum of 30 minutes discussed and encouraged, as a part of hypertension management. The importance of attaining a healthy weight is also discussed.  BP/Weight 09/06/2018 04/26/2018 04/18/2018 04/07/2018 01/21/2018 12/20/2017 7/67/2094  Systolic BP 709 628 366 294 765 465 035  Diastolic BP 80 82 80 86 78 82 87  Wt. (Lbs) 157 163 160 159 - 159.04 158  BMI 26.95 28.42 27.9 27.72 - 27.73 27.99       Allergic rhinitis Uncontrolled, depo medrol and short course prednisone and start singulair  Hyperlipemia Hyperlipidemia:Low fat diet discussed and encouraged.   Lipid Panel  Lab Results  Component Value Date   CHOL 209 (H) 08/31/2018   HDL 42 (L) 08/31/2018   LDLCALC 139 (H) 08/31/2018   TRIG 149 08/31/2018   CHOLHDL 5.0 (H) 08/31/2018   Needs to reduce fried and fatty foods    Muscle spasm Uncontrolled and disabling, start bedtime Zanaflex, as needed

## 2018-09-12 NOTE — Assessment & Plan Note (Signed)
Uncontrolled and disabling, start bedtime Zanaflex, as needed

## 2018-09-12 NOTE — Assessment & Plan Note (Signed)
Tenderness to palpation of multiple muscles and joints, with joint deformity, esp of digits, depo medrol and oral prednisone and eval by Rheumatology

## 2018-09-12 NOTE — Assessment & Plan Note (Signed)
Uncontrolled, depo medrol and short course prednisone and start singulair

## 2018-09-21 DIAGNOSIS — Z6827 Body mass index (BMI) 27.0-27.9, adult: Secondary | ICD-10-CM | POA: Diagnosis not present

## 2018-09-21 DIAGNOSIS — E663 Overweight: Secondary | ICD-10-CM | POA: Diagnosis not present

## 2018-09-21 DIAGNOSIS — M2550 Pain in unspecified joint: Secondary | ICD-10-CM | POA: Diagnosis not present

## 2018-10-02 ENCOUNTER — Ambulatory Visit: Payer: PPO | Admitting: Orthopedic Surgery

## 2018-10-02 ENCOUNTER — Encounter: Payer: Self-pay | Admitting: Orthopedic Surgery

## 2018-10-02 ENCOUNTER — Ambulatory Visit (INDEPENDENT_AMBULATORY_CARE_PROVIDER_SITE_OTHER): Payer: PPO

## 2018-10-02 VITALS — BP 160/89 | HR 94 | Ht 64.0 in | Wt 158.0 lb

## 2018-10-02 DIAGNOSIS — M79645 Pain in left finger(s): Secondary | ICD-10-CM | POA: Diagnosis not present

## 2018-10-02 DIAGNOSIS — M1812 Unilateral primary osteoarthritis of first carpometacarpal joint, left hand: Secondary | ICD-10-CM

## 2018-10-02 NOTE — Progress Notes (Signed)
NEW PATIENT OFFICE VISIT  Chief Complaint  Patient presents with  . Hand Pain    Left hand on and off for years    78 years old presents for evaluation of dull aching nonradiating pain left thumb.  She had 1 year history of pain over the left thumb with swelling which is intermittent appears to be activity related associated with some loss of pinch activity   Review of Systems  Constitutional: Negative for chills, fever and weight loss.  Respiratory: Negative for shortness of breath.   Cardiovascular: Negative for chest pain.  Neurological: Negative for tingling.     Past Medical History:  Diagnosis Date  . Deep vein thrombosis (DVT) (HCC) 08/2016   Right leg  . Dry eyes 2010   on restasis - Dr. Jorja Loa   . Gout 2009  . Hyperlipidemia   . Hypertension   . Spinal stenosis     Past Surgical History:  Procedure Laterality Date  . CESAREAN SECTION    . COLONOSCOPY  04/09/2009   EHM:CNOB papilla otherwise, normal rectum/Left-sided diverticula/Cecal polyp status post cold snare and removal/The remainder of the colonic mucosa appeared normal. Adenomatous polyps, next TCS 03/2016 per RMR.  . COLONOSCOPY N/A 05/19/2016   Procedure: COLONOSCOPY;  Surgeon: Daneil Dolin, MD;  Location: [facility];  Service: Endoscopy;  Laterality: N/A;  1:00 PM    Family History  Problem Relation Age of Onset  . Stroke Mother   . Stroke Father   . Diabetes Sister   . Pulmonary embolism Sister   . Cirrhosis Brother   . Colon cancer Neg Hx    Social History   Tobacco Use  . Smoking status: Former Smoker    Packs/day: 0.50    Years: 10.00    Pack years: 5.00    Types: Cigarettes    Last attempt to quit: 10/07/1983    Years since quitting: 35.0  . Smokeless tobacco: Never Used  Substance Use Topics  . Alcohol use: No  . Drug use: No    Allergies  Allergen Reactions  . Benazepril Other (See Comments)    Patient does not recall being on this medication and/or what reaction she  might have had.  . Metoprolol     CRAMPS    Current Meds  Medication Sig  . amLODipine (NORVASC) 10 MG tablet TAKE 1 TABLET(10 MG) BY MOUTH DAILY  . aspirin EC 81 MG tablet Take 81 mg by mouth daily.  . Biotin w/ Vitamins C & E (HAIR SKIN & NAILS GUMMIES PO) Take by mouth daily.  . Cholecalciferol 2000 units CAPS Take 1 capsule (2,000 Units total) by mouth daily with breakfast.  . diclofenac (CATAFLAM) 50 MG tablet Take 1 tablet (50 mg total) by mouth 2 (two) times daily.  . Flaxseed, Linseed, (FLAXSEED OIL) 1000 MG CAPS Take 1 capsule by mouth daily.  Marland Kitchen gabapentin (NEURONTIN) 100 MG capsule One capsule at bedtime for back pain  . montelukast (SINGULAIR) 10 MG tablet Take 1 tablet (10 mg total) by mouth at bedtime.  Vladimir Faster Glycol-Propyl Glycol (SYSTANE) 0.4-0.3 % SOLN Apply 1 drop to eye 2 (two) times daily.  Marland Kitchen spironolactone (ALDACTONE) 50 MG tablet TAKE 1 TABLET BY MOUTH DAILY  . tiZANidine (ZANAFLEX) 2 MG tablet Take 1 tablet (2 mg total) by mouth at bedtime.    BP (!) 160/89   Pulse 94   Ht 5\' 4"  (1.626 m)   Wt 158 lb (71.7 kg)   BMI 27.12 kg/m  Physical Exam Vitals signs reviewed.  Constitutional:      Appearance: Normal appearance. She is well-developed.  Neurological:     Mental Status: She is alert and oriented to person, place, and time.  Psychiatric:        Attention and Perception: Attention normal.        Mood and Affect: Mood and affect normal.        Speech: Speech normal.        Behavior: Behavior normal.        Thought Content: Thought content normal.        Judgment: Judgment normal.     Ortho Exam  Left hand and thumb tenderness over the [facility] joint with swelling No range of motion deficit Weak pinch No instability but subluxate able CMC joint Skin normal Neurovascular exam intact  Right hand and thumb no tenderness over the [facility] joint no swelling full range of motion no instability skin is normal pulse and sensation is normal as  well    MEDICAL DECISION SECTION  Xrays were done at [facility]  My independent reading of xrays:  [facility] joint arthritis this is progressed since the last x-ray done on 2018  Encounter Diagnoses  Name Primary?  . Pain of left thumb   . Arthritis of carpometacarpal (CMC) joint of left thumb Yes    PLAN: (Rx., injectx, surgery, frx, mri/ct) Topical medications were recommended along with splinting and ice when symptomatic  No orders of the defined types were placed in this encounter.   Arther Abbott, MD  10/02/2018 9:16 AM

## 2018-10-02 NOTE — Patient Instructions (Addendum)
Wear the thumb splint and apply ice until the swelling goes down and apply these medications  Apply These are the muscle and arthrits creams I recommend:  PLEASE READ THE PACKAGE INSTRUCTIONS BEFORE USING   Ben Gay arthritis cream  Icy hot vanishing gel  Aspercreme odor free  Myoflex Oderless pain reliever  Capzasin  Sportscreme  Max freeze

## 2018-10-09 ENCOUNTER — Ambulatory Visit (INDEPENDENT_AMBULATORY_CARE_PROVIDER_SITE_OTHER): Payer: PPO

## 2018-10-09 ENCOUNTER — Encounter: Payer: Self-pay | Admitting: Orthopedic Surgery

## 2018-10-09 ENCOUNTER — Ambulatory Visit: Payer: PPO | Admitting: Orthopedic Surgery

## 2018-10-09 VITALS — BP 153/87 | HR 93 | Ht 64.0 in | Wt 156.0 lb

## 2018-10-09 DIAGNOSIS — M25562 Pain in left knee: Secondary | ICD-10-CM | POA: Diagnosis not present

## 2018-10-09 DIAGNOSIS — M1712 Unilateral primary osteoarthritis, left knee: Secondary | ICD-10-CM

## 2018-10-09 DIAGNOSIS — M1812 Unilateral primary osteoarthritis of first carpometacarpal joint, left hand: Secondary | ICD-10-CM

## 2018-10-09 DIAGNOSIS — G8929 Other chronic pain: Secondary | ICD-10-CM

## 2018-10-09 NOTE — Progress Notes (Signed)
Chief Complaint  Patient presents with  . Knee Pain  . Hand Pain    Encounter Diagnoses  Name Primary?  . Chronic pain of left knee Yes  . Arthritis of carpometacarpal (CMC) joint of left thumb   . Primary osteoarthritis of left knee     The patient has osteoarthritis of the left knee here for 36-month follow-up.  She says her knee has been doing well as long as she wraps her brace around.  She does complain of cramps  Her left thumb arthritis is improved with bracing  BP (!) 153/87   Pulse 93   Ht 5\' 4"  (1.626 m)   Wt 156 lb (70.8 kg)   BMI 26.78 kg/m   Left knee no tenderness today no swelling flexion is 120 degrees she has no instability in the knee muscle tone is normal neurovascular exam is intact she is ambulatory with no assistive devices  She has a thumb splint on the left  Doing well follow-up 6 months x-ray left knee

## 2018-10-10 ENCOUNTER — Other Ambulatory Visit: Payer: Self-pay | Admitting: Family Medicine

## 2018-10-19 LAB — PTH, INTACT AND CALCIUM
Calcium: 10.7 mg/dL — ABNORMAL HIGH (ref 8.6–10.4)
PTH: 42 pg/mL (ref 14–64)

## 2018-10-19 LAB — CREATININE, URINE, 24 HOUR: Creatinine, 24H Ur: 0.8 g/(24.h) (ref 0.50–2.15)

## 2018-10-19 LAB — CALCIUM, URINE, 24 HOUR: CALCIUM 24H UR: 44 mg/(24.h)

## 2018-10-25 ENCOUNTER — Ambulatory Visit (INDEPENDENT_AMBULATORY_CARE_PROVIDER_SITE_OTHER): Payer: PPO | Admitting: "Endocrinology

## 2018-10-25 ENCOUNTER — Encounter: Payer: Self-pay | Admitting: "Endocrinology

## 2018-10-25 NOTE — Progress Notes (Signed)
Kristen Cox, CMA  

## 2018-10-25 NOTE — Progress Notes (Signed)
Endocrinology follow-up note   Subjective:    Patient ID: Kristen Cox, female    DOB: Jan 03, 1941, PCP Fayrene Helper, MD   Past Medical History:  Diagnosis Date  . Deep vein thrombosis (DVT) (HCC) 08/2016   Right leg  . Dry eyes 2010   on restasis - Dr. Jorja Loa   . Gout 2009  . Hyperlipidemia   . Hypertension   . Spinal stenosis    Past Surgical History:  Procedure Laterality Date  . CESAREAN SECTION    . COLONOSCOPY  04/09/2009   WIO:MBTD papilla otherwise, normal rectum/Left-sided diverticula/Cecal polyp status post cold snare and removal/The remainder of the colonic mucosa appeared normal. Adenomatous polyps, next TCS 03/2016 per RMR.  . COLONOSCOPY N/A 05/19/2016   Procedure: COLONOSCOPY;  Surgeon: Daneil Dolin, MD;  Location: [facility];  Service: Endoscopy;  Laterality: N/A;  1:00 PM   Social History   Socioeconomic History  . Marital status: Widowed    Spouse name: Not on file  . Number of children: 2  . Years of education: Not on file  . Highest education level: 10th grade  Occupational History  . Occupation: retired   Scientific laboratory technician  . Financial resource strain: Somewhat hard  . Food insecurity:    Worry: Never true    Inability: Never true  . Transportation needs:    Medical: No    Non-medical: No  Tobacco Use  . Smoking status: Former Smoker    Packs/day: 0.50    Years: 10.00    Pack years: 5.00    Types: Cigarettes    Last attempt to quit: 10/07/1983    Years since quitting: 35.0  . Smokeless tobacco: Never Used  Substance and Sexual Activity  . Alcohol use: No  . Drug use: No  . Sexual activity: Never  Lifestyle  . Physical activity:    Days per week: 0 days    Minutes per session: 0 min  . Stress: Only a little  Relationships  . Social connections:    Talks on phone: Three times a week    Gets together: Once a week    Attends religious service: 1 to 4 times per year    Active member of club or organization: No    Attends  meetings of clubs or organizations: Never    Relationship status: Widowed  Other Topics Concern  . Not on file  Social History Narrative  . Not on file   Outpatient Encounter Medications as of 10/25/2018  Medication Sig  . amLODipine (NORVASC) 10 MG tablet TAKE 1 TABLET(10 MG) BY MOUTH DAILY  . aspirin EC 81 MG tablet Take 81 mg by mouth daily.  . Biotin w/ Vitamins C & E (HAIR SKIN & NAILS GUMMIES PO) Take by mouth daily.  . diclofenac (CATAFLAM) 50 MG tablet Take 1 tablet (50 mg total) by mouth 2 (two) times daily.  . Flaxseed, Linseed, (FLAXSEED OIL) 1000 MG CAPS Take 1 capsule by mouth daily.  Marland Kitchen gabapentin (NEURONTIN) 100 MG capsule One capsule at bedtime for back pain  . montelukast (SINGULAIR) 10 MG tablet Take 1 tablet (10 mg total) by mouth at bedtime.  Vladimir Faster Glycol-Propyl Glycol (SYSTANE) 0.4-0.3 % SOLN Apply 1 drop to eye 2 (two) times daily.  Marland Kitchen spironolactone (ALDACTONE) 50 MG tablet TAKE 1 TABLET BY MOUTH DAILY  . [DISCONTINUED] Cholecalciferol 2000 units CAPS Take 1 capsule (2,000 Units total) by mouth daily with breakfast. (Patient not taking: Reported on 10/25/2018)  . [  DISCONTINUED] tiZANidine (ZANAFLEX) 2 MG tablet Take 1 tablet (2 mg total) by mouth at bedtime. (Patient not taking: Reported on 10/25/2018)   No facility-administered encounter medications on file as of 10/25/2018.    ALLERGIES: Allergies  Allergen Reactions  . Benazepril Other (See Comments)    Patient does not recall being on this medication and/or what reaction she might have had.  . Metoprolol     CRAMPS   VACCINATION STATUS: Immunization History  Administered Date(s) Administered  . Influenza,inj,Quad PF,6+ Mos 06/12/2014, 08/21/2015, 07/08/2016, 06/23/2017, 04/18/2018  . Pneumococcal Conjugate-13 11/13/2014  . Pneumococcal Polysaccharide-23 02/17/2016  . Tdap 02/01/2011    HPI  78 year old female with medical history as above.  She is being seen in consultation for  Hypercalcemia, related  to mild hyperparathyroidism.  -Her previsit labs show calcium 10.7/dL, remaining stable.  This is slightly above target, however her PTH remains normal at 42, improving from 75.    Her prior 24 hour urine calcium has been low at 44,  and was WNL at 118 last check.  She is not currently on calcium supplements.  She denies any family history of parathyroid dysfunction.  She denies any hx of CVA, seizures, nephrolithiasis, nor CKD. she denies Osteoporosis. she is a former smoker.  She did not have bone density since 2015. she denies any new complaints.  Review of Systems  Constitutional: + Weight loss, no subjective hyperthermia/hypothermia.   Eyes: no blurry vision, no xerophthalmia ENT: no sore throat, no nodules palpated in throat, no dysphagia/odynophagia, no hoarseness Cardiovascular: No chest pain, no palpitations    Respiratory: no cough/SOB Gastrointestinal: no N/V/D/C Musculoskeletal: no muscle/joint aches Skin: no rashes Neurological: no murmurs, no tingling.  Psychiatric: no depression/anxiety   Objective:    BP (!) 150/90   Pulse 95   Ht 5' (1.524 m)   Wt 152 lb (68.9 kg)   SpO2 98%   BMI 29.69 kg/m   Wt Readings from Last 3 Encounters:  10/25/18 152 lb (68.9 kg)  10/09/18 156 lb (70.8 kg)  10/02/18 158 lb (71.7 kg)    Physical Exam Constitutional: slightly overweight, not in acute distress.  Eyes: PERRLA, EOMI, no exophthalmos ENT: moist mucous membranes, no gross thyromegaly,  no cervical lymphadenopathy Musculoskeletal: no deformities, strength intact in all 4 Skin: moist, warm, no rashes Neurological: no tremor with outstretched hands  Diabetic Labs (most recent): Lab Results  Component Value Date   HGBA1C 5.6 07/01/2016   HGBA1C 6.1 (H) 10/30/2015   HGBA1C 5.9 (H) 03/19/2015    Lipid Panel     Component Value Date/Time   CHOL 209 (H) 08/31/2018 0819   TRIG 149 08/31/2018 0819   HDL 42 (L) 08/31/2018 0819   CHOLHDL 5.0 (H) 08/31/2018 0819   VLDL  19 02/11/2016 0718   LDLCALC 139 (H) 08/31/2018 0819   Recent Results (from the past 2160 hour(s))  BASIC METABOLIC PANEL WITH GFR     Status: Abnormal   Collection Time: 08/31/18  8:19 AM  Result Value Ref Range   Glucose, Bld 97 65 - 99 mg/dL    Comment: .            Fasting reference interval .    BUN 25 7 - 25 mg/dL   Creat 1.00 (H) 0.60 - 0.93 mg/dL    Comment: For patients >19 years of age, the reference limit for Creatinine is approximately 13% higher for people identified as African-American. .    GFR, Est Non African  American 54 (L) > OR = 60 mL/min/1.14m2   GFR, Est African American 63 > OR = 60 mL/min/1.46m2   BUN/Creatinine Ratio 25 (H) 6 - 22 (calc)   Sodium 134 (L) 135 - 146 mmol/L   Potassium 4.6 3.5 - 5.3 mmol/L   Chloride 99 98 - 110 mmol/L   CO2 24 20 - 32 mmol/L   Calcium 10.4 8.6 - 10.4 mg/dL  Lipid panel     Status: Abnormal   Collection Time: 08/31/18  8:19 AM  Result Value Ref Range   Cholesterol 209 (H) <200 mg/dL   HDL 42 (L) >50 mg/dL   Triglycerides 149 <150 mg/dL   LDL Cholesterol (Calc) 139 (H) mg/dL (calc)    Comment: Reference range: <100 . Desirable range <100 mg/dL for primary prevention;   <70 mg/dL for patients with CHD or diabetic patients  with > or = 2 CHD risk factors. Marland Kitchen LDL-C is now calculated using the Martin-Hopkins  calculation, which is a validated novel method providing  better accuracy than the Friedewald equation in the  estimation of LDL-C.  Cresenciano Genre et al. Annamaria Helling. 8756;433(29): 2061-2068  (http://education.[facility].com/faq/FAQ164)    Total CHOL/HDL Ratio 5.0 (H) <5.0 (calc)   Non-HDL Cholesterol (Calc) 167 (H) <130 mg/dL (calc)    Comment: For patients with diabetes plus 1 major ASCVD risk  factor, treating to a non-HDL-C goal of <100 mg/dL  (LDL-C of <70 mg/dL) is considered a therapeutic  option.   Calcium, urine, 24 hour     Status: None   Collection Time: 10/18/18  7:14 AM  Result Value Ref Range    Calcium, 24H Urine 44 mg/24 h    Comment:                           Reference Range  35-250                            Low calcium diet 35-200   Creatinine, urine, 24 hour     Status: None   Collection Time: 10/18/18  7:14 AM  Result Value Ref Range   Creatinine, 24H Ur 0.80 0.50 - 2.15 g/24 h  PTH, intact and calcium     Status: Abnormal   Collection Time: 10/18/18  7:14 AM  Result Value Ref Range   PTH 42 14 - 64 pg/mL    Comment: . Interpretive Guide    Intact PTH           Calcium ------------------    ----------           ------- Normal Parathyroid    Normal               Normal Hypoparathyroidism    Low or Low Normal    Low Hyperparathyroidism    Primary            Normal or High       High    Secondary          High                 Normal or Low    Tertiary           High                 High Non-Parathyroid    Hypercalcemia      Low or Low Normal    High .  Calcium 10.7 (H) 8.6 - 10.4 mg/dL    Assessment & Plan:   1. Hypercalcemia Her repeat lab results are reviewed with her.  Her PTH remains normal at 42 associated with slightly above target calcium of 10.7.  Her 24-hour urine calcium has been low at 44.  This is still consistent with mild primary hyperparathyroidism.    -She will not require intervention at this time, not a surgical candidate.   -  I discussed the potential complications of untreated hypercalcemia.   -She is advised to return in 1 year with repeat PTH/calcium, magnesium, phosphorus levels. -She is advised to continue maintenance vitamin D3 2000 units daily.    - I advised patient to maintain close follow up with Fayrene Helper, MD for primary care needs. Follow up plan: Return in about 1 year (around 10/25/2019) for Follow up with Pre-visit Labs.  Glade Lloyd, MD Phone: [phone]  Fax: [phone]  This note was partially dictated with voice recognition software. Similar sounding words can be transcribed inadequately or may not  be  corrected upon review.  10/25/2018, 11:45 AM

## 2018-11-09 ENCOUNTER — Ambulatory Visit (INDEPENDENT_AMBULATORY_CARE_PROVIDER_SITE_OTHER): Payer: PPO | Admitting: Family Medicine

## 2018-11-09 ENCOUNTER — Other Ambulatory Visit: Payer: Self-pay

## 2018-11-09 ENCOUNTER — Encounter: Payer: Self-pay | Admitting: Family Medicine

## 2018-11-09 VITALS — BP 130/76 | HR 87 | Resp 15 | Ht 60.0 in | Wt 150.0 lb

## 2018-11-09 DIAGNOSIS — M609 Myositis, unspecified: Secondary | ICD-10-CM | POA: Diagnosis not present

## 2018-11-09 DIAGNOSIS — R252 Cramp and spasm: Secondary | ICD-10-CM | POA: Diagnosis not present

## 2018-11-09 DIAGNOSIS — M62838 Other muscle spasm: Secondary | ICD-10-CM | POA: Diagnosis not present

## 2018-11-09 DIAGNOSIS — I10 Essential (primary) hypertension: Secondary | ICD-10-CM | POA: Diagnosis not present

## 2018-11-09 NOTE — Patient Instructions (Addendum)
Wellness with nurse to be scheduled for end April or after   Physical exam  With MD  8/30 or after, call if you need me sooner  Labs today, cmp and EGFr and Magnesium  Stop the tizanidine since no benefit, if necessary, resume and let me know  For cramps, take tonic water 2 oz, with the mustard ,may increase tonic water, check calcium in ( 2 shot glass size max)

## 2018-11-10 LAB — COMPLETE METABOLIC PANEL WITH GFR
AG RATIO: 1.6 (calc) (ref 1.0–2.5)
ALT: 11 U/L (ref 6–29)
AST: 18 U/L (ref 10–35)
Albumin: 4.5 g/dL (ref 3.6–5.1)
Alkaline phosphatase (APISO): 68 U/L (ref 37–153)
BILIRUBIN TOTAL: 0.5 mg/dL (ref 0.2–1.2)
BUN / CREAT RATIO: 21 (calc) (ref 6–22)
BUN: 20 mg/dL (ref 7–25)
CO2: 24 mmol/L (ref 20–32)
Calcium: 10.7 mg/dL — ABNORMAL HIGH (ref 8.6–10.4)
Chloride: 104 mmol/L (ref 98–110)
Creat: 0.96 mg/dL — ABNORMAL HIGH (ref 0.60–0.93)
GFR, Est African American: 66 mL/min/{1.73_m2} (ref >=60)
GFR, Est Non African American: 57 mL/min/{1.73_m2} — ABNORMAL LOW (ref >=60)
GLOBULIN: 2.8 g/dL (ref 1.9–3.7)
Glucose, Bld: 89 mg/dL (ref 65–139)
Potassium: 4.5 mmol/L (ref 3.5–5.3)
Sodium: 136 mmol/L (ref 135–146)
Total Protein: 7.3 g/dL (ref 6.1–8.1)

## 2018-11-10 LAB — MAGNESIUM: Magnesium: 1.8 mg/dL (ref 1.5–2.5)

## 2018-11-12 NOTE — Assessment & Plan Note (Signed)
Controlled, no change in medication DASH diet and commitment to daily physical activity for a minimum of 30 minutes discussed and encouraged, as a part of hypertension management. The importance of attaining a healthy weight is also discussed.  BP/Weight 11/09/2018 10/25/2018 10/09/2018 10/02/2018 09/06/2018 04/26/2018 0/16/0109  Systolic BP 323 557 322 025 427 062 376  Diastolic BP 76 90 87 89 80 82 80  Wt. (Lbs) 150 152 156 158 157 163 160  BMI 29.29 29.69 26.78 27.12 26.95 28.42 27.9

## 2018-11-12 NOTE — Assessment & Plan Note (Signed)
Marked improvement in symptoms

## 2018-11-12 NOTE — Assessment & Plan Note (Signed)
Mild , however pt careful re calcium supplements

## 2018-11-12 NOTE — Progress Notes (Signed)
   Kristen Cox     MRN: [id number]      DOB: Nov 05, 1940   HPI Kristen Cox is here for follow up and re-evaluation of chronic medical conditions, medication management and review of any available recent lab and radiology data.  Preventive health is updated, specifically  Cancer screening and Immunization.   Questions or concerns regarding consultations or procedures which the PT has had in the interim are  addressed. The PT denies any adverse reactions to current medications since the last visit.  There are no new concerns. Still experiencing muscle cramps, but otherwise reports much improved ROS Denies recent fever or chills. Denies sinus pressure, nasal congestion, ear pain or sore throat. Denies chest congestion, productive cough or wheezing. Denies chest pains, palpitations and leg swelling Denies abdominal pain, nausea, vomiting,diarrhea or constipation.   Denies dysuria, frequency, hesitancy or incontinence. Denies uncontrolled  joint pain, swelling and limitation in mobility. Denies headaches, seizures, numbness, or tingling. Denies depression, anxiety or insomnia. Denies skin break down or rash.   PE  BP 130/76   Pulse 87   Resp 15   Ht 5' (1.524 m)   Wt 150 lb (68 kg)   SpO2 99%   BMI 29.29 kg/m   Patient alert and oriented and in no cardiopulmonary distress.  HEENT: No facial asymmetry, EOMI,   oropharynx pink and moist.  Neck supple no JVD, no mass.  Chest: Clear to auscultation bilaterally.  CVS: S1, S2 no murmurs, no S3.Regular rate.  ABD: Soft non tender.   Ext: No edema  MS: Adequate though reduced ROM spine, shoulders, hips and knees.  Skin: Intact, no ulcerations or rash noted.  Psych: Good eye contact, normal affect. Memory intact not anxious or depressed appearing.  CNS: CN 2-12 intact, power,  normal throughout.no focal deficits noted.   Assessment & Plan  Essential hypertension Controlled, no change in medication DASH diet and commitment  to daily physical activity for a minimum of 30 minutes discussed and encouraged, as a part of hypertension management. The importance of attaining a healthy weight is also discussed.  BP/Weight 11/09/2018 10/25/2018 10/09/2018 10/02/2018 09/06/2018 04/26/2018 1/91/4782  Systolic BP 956 213 086 578 469 629 528  Diastolic BP 76 90 87 89 80 82 80  Wt. (Lbs) 150 152 156 158 157 163 160  BMI 29.29 29.69 26.78 27.12 26.95 28.42 27.9       Myositis of multiple sites Marked improvement in symptoms  Hypercalcemia Mild , however pt careful re calcium supplements  Muscle spasm Not currently as severe, but persisting Advised trial of tonic water and continuation of mustard. Will check magnesium and potassium levels to see if they are contributing

## 2018-11-12 NOTE — Assessment & Plan Note (Signed)
Not currently as severe, but persisting Advised trial of tonic water and continuation of mustard. Will check magnesium and potassium levels to see if they are contributing

## 2018-11-20 ENCOUNTER — Other Ambulatory Visit (HOSPITAL_COMMUNITY): Payer: Self-pay | Admitting: Family Medicine

## 2018-11-20 DIAGNOSIS — Z1231 Encounter for screening mammogram for malignant neoplasm of breast: Secondary | ICD-10-CM

## 2018-12-11 ENCOUNTER — Ambulatory Visit: Payer: Self-pay | Admitting: Obstetrics & Gynecology

## 2019-01-01 ENCOUNTER — Ambulatory Visit (HOSPITAL_COMMUNITY): Payer: Self-pay

## 2019-01-01 ENCOUNTER — Ambulatory Visit: Payer: Self-pay | Admitting: Family Medicine

## 2019-01-05 ENCOUNTER — Other Ambulatory Visit: Payer: Self-pay | Admitting: Family Medicine

## 2019-01-10 ENCOUNTER — Other Ambulatory Visit: Payer: Self-pay | Admitting: Family Medicine

## 2019-01-16 ENCOUNTER — Other Ambulatory Visit: Payer: Self-pay

## 2019-01-16 ENCOUNTER — Ambulatory Visit (INDEPENDENT_AMBULATORY_CARE_PROVIDER_SITE_OTHER): Payer: PPO | Admitting: Obstetrics & Gynecology

## 2019-01-16 ENCOUNTER — Other Ambulatory Visit (HOSPITAL_COMMUNITY)
Admission: RE | Admit: 2019-01-16 | Discharge: 2019-01-16 | Disposition: A | Discharge status: Home / Self Care | Payer: PPO | Source: Ambulatory Visit | Attending: Obstetrics & Gynecology | Admitting: Obstetrics & Gynecology

## 2019-01-16 ENCOUNTER — Encounter: Payer: Self-pay | Admitting: Obstetrics & Gynecology

## 2019-01-16 VITALS — BP 129/76 | HR 95 | Ht 64.0 in | Wt 151.0 lb

## 2019-01-16 DIAGNOSIS — Z1211 Encounter for screening for malignant neoplasm of colon: Secondary | ICD-10-CM | POA: Diagnosis not present

## 2019-01-16 DIAGNOSIS — Z1151 Encounter for screening for human papillomavirus (HPV): Secondary | ICD-10-CM | POA: Insufficient documentation

## 2019-01-16 DIAGNOSIS — Z01419 Encounter for gynecological examination (general) (routine) without abnormal findings: Secondary | ICD-10-CM | POA: Insufficient documentation

## 2019-01-16 DIAGNOSIS — Z1212 Encounter for screening for malignant neoplasm of rectum: Secondary | ICD-10-CM

## 2019-01-16 NOTE — Progress Notes (Signed)
Subjective:     Kristen Cox is a 77 y.o. female here for a routine exam.  No LMP recorded. Patient is postmenopausal. G2P2 Birth Control Method:  menopausal Menstrual Calendar(currently): amenorrheic  Current complaints: back nee.   Current acute medical issues:  No acute, some leg cramps recommended K+ and Mg++ supplements   Recent Gynecologic History No LMP recorded. Patient is postmenopausal. Last Pap: 2017,  normal Last mammogram: 12/2017,  normal  Past Medical History:  Diagnosis Date  . Deep vein thrombosis (DVT) (HCC) 08/2016   Right leg  . Dry eyes 2010   on restasis - Dr. Jorja Loa   . Gout 2009  . Hyperlipidemia   . Hypertension   . Spinal stenosis     Past Surgical History:  Procedure Laterality Date  . CESAREAN SECTION    . COLONOSCOPY  04/09/2009   CZY:SAYT papilla otherwise, normal rectum/Left-sided diverticula/Cecal polyp status post cold snare and removal/The remainder of the colonic mucosa appeared normal. Adenomatous polyps, next TCS 03/2016 per RMR.  . COLONOSCOPY N/A 05/19/2016   Procedure: COLONOSCOPY;  Surgeon: Daneil Dolin, MD;  Location: [facility];  Service: Endoscopy;  Laterality: N/A;  1:00 PM    OB History    Gravida  2   Para  2   Term      Preterm      AB      Living  2     SAB      TAB      Ectopic      Multiple      Live Births              Social History   Socioeconomic History  . Marital status: Widowed    Spouse name: Not on file  . Number of children: 2  . Years of education: Not on file  . Highest education level: 10th grade  Occupational History  . Occupation: retired   Scientific laboratory technician  . Financial resource strain: Somewhat hard  . Food insecurity:    Worry: Never true    Inability: Never true  . Transportation needs:    Medical: No    Non-medical: No  Tobacco Use  . Smoking status: Former Smoker    Packs/day: 0.50    Years: 10.00    Pack years: 5.00    Types: Cigarettes    Last attempt to  quit: 10/07/1983    Years since quitting: 35.3  . Smokeless tobacco: Never Used  Substance and Sexual Activity  . Alcohol use: No  . Drug use: No  . Sexual activity: Never  Lifestyle  . Physical activity:    Days per week: 0 days    Minutes per session: 0 min  . Stress: Only a little  Relationships  . Social connections:    Talks on phone: Three times a week    Gets together: Once a week    Attends religious service: 1 to 4 times per year    Active member of club or organization: No    Attends meetings of clubs or organizations: Never    Relationship status: Widowed  Other Topics Concern  . Not on file  Social History Narrative  . Not on file    Family History  Problem Relation Age of Onset  . Stroke Mother   . Stroke Father   . Diabetes Sister   . Pulmonary embolism Sister   . Cirrhosis Brother   . Colon cancer Neg Hx  Current Outpatient Medications:  .  amLODipine (NORVASC) 10 MG tablet, TAKE 1 TABLET(10 MG) BY MOUTH DAILY, Disp: 90 tablet, Rfl: 0 .  aspirin EC 81 MG tablet, Take 81 mg by mouth daily., Disp: , Rfl:  .  gabapentin (NEURONTIN) 100 MG capsule, One capsule at bedtime for back pain, Disp: 30 capsule, Rfl: 5 .  spironolactone (ALDACTONE) 50 MG tablet, TAKE 1 TABLET BY MOUTH DAILY, Disp: 90 tablet, Rfl: 0 .  Flaxseed, Linseed, (FLAXSEED OIL) 1000 MG CAPS, Take 1 capsule by mouth daily., Disp: , Rfl:  .  montelukast (SINGULAIR) 10 MG tablet, TAKE 1 TABLET(10 MG) BY MOUTH AT BEDTIME (Patient not taking: Reported on 01/16/2019), Disp: 30 tablet, Rfl: 3 .  Polyethyl Glycol-Propyl Glycol (SYSTANE) 0.4-0.3 % SOLN, Apply 1 drop to eye 2 (two) times daily., Disp: , Rfl:   Review of Systems  Review of Systems  Constitutional: Negative for fever, chills, weight loss, malaise/fatigue and diaphoresis.  HENT: Negative for hearing loss, ear pain, nosebleeds, congestion, sore throat, neck pain, tinnitus and ear discharge.   Eyes: Negative for blurred vision, double  vision, photophobia, pain, discharge and redness.  Respiratory: Negative for cough, hemoptysis, sputum production, shortness of breath, wheezing and stridor.   Cardiovascular: Negative for chest pain, palpitations, orthopnea, claudication, leg swelling and PND.  Gastrointestinal: negative for abdominal pain. Negative for heartburn, nausea, vomiting, diarrhea, constipation, blood in stool and melena.  Genitourinary: Negative for dysuria, urgency, frequency, hematuria and flank pain.  Musculoskeletal: Negative for myalgias, back pain, joint pain and falls.  Skin: Negative for itching and rash.  Neurological: Negative for dizziness, tingling, tremors, sensory change, speech change, focal weakness, seizures, loss of consciousness, weakness and headaches.  Endo/Heme/Allergies: Negative for environmental allergies and polydipsia. Does not bruise/bleed easily.  Psychiatric/Behavioral: Negative for depression, suicidal ideas, hallucinations, memory loss and substance abuse. The patient is not nervous/anxious and does not have insomnia.        Objective:  Blood pressure 129/76, pulse 95, height 5\' 4"  (1.626 m), weight 151 lb (68.5 kg).   Physical Exam  Vitals reviewed. Constitutional: She is oriented to person, place, and time. She appears well-developed and well-nourished.  HENT:  Head: Normocephalic and atraumatic.        Right Ear: External ear normal.  Left Ear: External ear normal.  Nose: Nose normal.  Mouth/Throat: Oropharynx is clear and moist.  Eyes: Conjunctivae and EOM are normal. Pupils are equal, round, and reactive to light. Right eye exhibits no discharge. Left eye exhibits no discharge. No scleral icterus.  Neck: Normal range of motion. Neck supple. No tracheal deviation present. No thyromegaly present.  Cardiovascular: Normal rate, regular rhythm, normal heart sounds and intact distal pulses.  Exam reveals no gallop and no friction rub.   No murmur heard. Respiratory: Effort normal  and breath sounds normal. No respiratory distress. She has no wheezes. She has no rales. She exhibits no tenderness.  GI: Soft. Bowel sounds are normal. She exhibits no distension and no mass. There is no tenderness. There is no rebound and no guarding.  Genitourinary:  Breasts no masses skin changes or nipple changes bilaterally      Vulva is normal without lesions Vagina is pink moist without discharge Cervix normal in appearance and pap is done Uterus is normal size shape and contour Adnexa is negative  {Rectal    hemoccult negative, normal tone, no masses  Musculoskeletal: Normal range of motion. She exhibits no edema and no tenderness.  Neurological: She is alert  and oriented to person, place, and time. She has normal reflexes. She displays normal reflexes. No cranial nerve deficit. She exhibits normal muscle tone. Coordination normal.  Skin: Skin is warm and dry. No rash noted. No erythema. No pallor.  Psychiatric: She has a normal mood and affect. Her behavior is normal. Judgment and thought content normal.       Medications Ordered at today's visit: No orders of the defined types were placed in this encounter.   Other orders placed at today's visit: No orders of the defined types were placed in this encounter.     Assessment:    Healthy female exam.   Normal screening Plan:    Mammogram ordered. Follow up in: 3 years.   Mammogram scheduled 01/2019  Return in about 3 years (around 01/15/2022) for yearly.

## 2019-01-16 NOTE — Addendum Note (Signed)
Addended by: Diona Fanti A on: 01/16/2019 11:22 AM   Modules accepted: Orders

## 2019-01-17 LAB — CYTOLOGY - PAP
Diagnosis: NEGATIVE
HPV: NOT DETECTED

## 2019-01-19 ENCOUNTER — Other Ambulatory Visit: Payer: Self-pay | Admitting: Orthopedic Surgery

## 2019-02-19 ENCOUNTER — Other Ambulatory Visit: Payer: Self-pay

## 2019-02-19 ENCOUNTER — Ambulatory Visit (HOSPITAL_COMMUNITY)
Admission: RE | Admit: 2019-02-19 | Discharge: 2019-02-19 | Disposition: A | Discharge status: Home / Self Care | Payer: PPO | Source: Ambulatory Visit | Attending: Family Medicine | Admitting: Family Medicine

## 2019-02-19 DIAGNOSIS — Z1231 Encounter for screening mammogram for malignant neoplasm of breast: Secondary | ICD-10-CM | POA: Insufficient documentation

## 2019-03-21 ENCOUNTER — Other Ambulatory Visit: Payer: Self-pay | Admitting: Family Medicine

## 2019-04-02 ENCOUNTER — Other Ambulatory Visit: Payer: Self-pay | Admitting: Family Medicine

## 2019-04-09 ENCOUNTER — Ambulatory Visit: Payer: Self-pay | Admitting: Orthopedic Surgery

## 2019-04-11 ENCOUNTER — Encounter: Payer: Self-pay | Admitting: *Deleted

## 2019-04-16 ENCOUNTER — Ambulatory Visit: Payer: PPO

## 2019-04-16 ENCOUNTER — Ambulatory Visit (INDEPENDENT_AMBULATORY_CARE_PROVIDER_SITE_OTHER): Payer: PPO | Admitting: Orthopedic Surgery

## 2019-04-16 ENCOUNTER — Other Ambulatory Visit: Payer: Self-pay

## 2019-04-16 VITALS — BP 141/104 | HR 113 | Ht 64.0 in | Wt 153.0 lb

## 2019-04-16 DIAGNOSIS — G8929 Other chronic pain: Secondary | ICD-10-CM

## 2019-04-16 DIAGNOSIS — M25562 Pain in left knee: Secondary | ICD-10-CM

## 2019-04-16 NOTE — Patient Instructions (Addendum)
These are the muscle and arthrits creams I recommend:  PLEASE READ THE PACKAGE INSTRUCTIONS BEFORE USING   Ben Gay arthritis cream  Icy hot vanishing gel  Aspercreme odor free  Myoflex Oderless pain reliever  Capzasin  Sportscreme  Max freeze   You have received an injection of steroids into the joint. 15% of patients will have increased pain within the 24 hours postinjection.   This is transient and will go away.   We recommend that you use ice packs on the injection site for 20 minutes every 2 hours and extra strength Tylenol 2 tablets every 8 as needed until the pain resolves.  If you continue to have pain after taking the Tylenol and using the ice please call the office for further instructions.

## 2019-04-16 NOTE — Progress Notes (Signed)
Progress Note   Patient ID: Kristen Cox, female   DOB: [birth year], 78 y.o.   MRN: [id number]   Chief Complaint  Patient presents with  . Knee Pain    left knee stays sore     Encounter Diagnosis  Name Primary?  . Chronic pain of left knee Yes    HPI 77 chronic knee pain left greater than right complains of soreness after sitting down stiffness after sitting down and increased pain on wet rainy days    ROS Denies numbness or tingling of the legs   BP (!) 141/104   Pulse (!) 113   Ht 5\' 4"  (1.626 m)   Wt 153 lb (69.4 kg)   BMI 26.26 kg/m   Physical Exam  Ambulates with no assistive devices  Mild tenderness in the anterolateral portions of both knees she has free and easy motion bilaterally with 115-120 degrees of flexion both knees are stable strength is normal bilaterally Medical decisions:  (Established problem worse, x-ray ,physical therapy, over-the-counter medicines, read outside film or summarize x-ray)  Data  Imaging:   Ordered x-ray x-ray shows valgus arthritis of the knee on the left  Encounter Diagnosis  Name Primary?  . Chronic pain of left knee Yes    PLAN:   Recommend topical application of sports creams several have been advised and I injected the left knee  6 mos xray left knee   Procedure note left knee injection   verbal consent was obtained to inject left knee joint  Timeout was completed to confirm the site of injection  The medications used were 40 mg of Depo-Medrol and 1% lidocaine 3 cc  Anesthesia was provided by ethyl chloride and the skin was prepped with alcohol.  After cleaning the skin with alcohol a 20-gauge needle was used to inject the left knee joint. There were no complications. A sterile bandage was applied.      Arther Abbott, MD 04/16/2019 3:35 PM

## 2019-04-23 ENCOUNTER — Encounter: Payer: Self-pay | Admitting: Family Medicine

## 2019-06-06 ENCOUNTER — Encounter: Payer: Self-pay | Admitting: Family Medicine

## 2019-06-06 ENCOUNTER — Ambulatory Visit (INDEPENDENT_AMBULATORY_CARE_PROVIDER_SITE_OTHER): Payer: PPO | Admitting: Family Medicine

## 2019-06-06 ENCOUNTER — Other Ambulatory Visit: Payer: Self-pay

## 2019-06-06 VITALS — BP 130/82 | HR 106 | Temp 97.6°F | Resp 15 | Ht 64.0 in | Wt 157.0 lb

## 2019-06-06 DIAGNOSIS — Z0000 Encounter for general adult medical examination without abnormal findings: Secondary | ICD-10-CM | POA: Diagnosis not present

## 2019-06-06 DIAGNOSIS — I10 Essential (primary) hypertension: Secondary | ICD-10-CM | POA: Diagnosis not present

## 2019-06-06 DIAGNOSIS — E7800 Pure hypercholesterolemia, unspecified: Secondary | ICD-10-CM

## 2019-06-06 DIAGNOSIS — E559 Vitamin D deficiency, unspecified: Secondary | ICD-10-CM | POA: Diagnosis not present

## 2019-06-06 DIAGNOSIS — F321 Major depressive disorder, single episode, moderate: Secondary | ICD-10-CM

## 2019-06-06 DIAGNOSIS — R252 Cramp and spasm: Secondary | ICD-10-CM | POA: Diagnosis not present

## 2019-06-06 DIAGNOSIS — Z23 Encounter for immunization: Secondary | ICD-10-CM | POA: Diagnosis not present

## 2019-06-06 DIAGNOSIS — G4762 Sleep related leg cramps: Secondary | ICD-10-CM

## 2019-06-06 DIAGNOSIS — M62838 Other muscle spasm: Secondary | ICD-10-CM

## 2019-06-06 MED ORDER — GABAPENTIN 100 MG PO CAPS: 100.0000 mg | ORAL_CAPSULE | Freq: Three times a day (TID) | ORAL | 5 refills | Status: DC

## 2019-06-06 MED ORDER — MONTELUKAST SODIUM 10 MG PO TABS: ORAL_TABLET | ORAL | 5 refills | Status: DC

## 2019-06-06 NOTE — Assessment & Plan Note (Signed)

## 2019-06-06 NOTE — Progress Notes (Signed)
    Kristen Cox     MRN: [id number]      DOB: 04/04/1941  HPI: Patient is in for annual physical exam. C/o  Chronic knee pain and disabling night cramps in legs.None during the daytime Recent labs, if available are reviewed. Immunization is reviewed , and  updated if needed.   PE: BP 130/82   Pulse (!) 106   Temp 97.6 F (36.4 C) (Temporal)   Resp 15   Ht 5\' 4"  (1.626 m)   Wt 157 lb (71.2 kg)   SpO2 97%   BMI 26.95 kg/m     Pleasant  female, alert and oriented x 3, in no cardio-pulmonary distress. Afebrile. HEENT No facial trauma or asymetry. Sinuses non tender.  Extra occullar muscles intact.. External ears normal, . Neck: decreased though adequate ROM, no adenopathy,JVD or thyromegaly.No bruits.  Chest: Clear to ascultation bilaterally.No crackles or wheezes. Non tender to palpation  Breast: Not examined, denies pain r mass, and mammogram is up to date   Cardiovascular system; Heart sounds normal,  S1 and  S2 ,no S3.  No murmur, or thrill. Apical beat not displaced Peripheral pulses normal.  Abdomen: Soft, non tender, no organomegaly or masses.  No guarding, tenderness or rebound.    GU: Not indicated and asymptomatic   Musculoskeletal exam: Decreased though adequate   ROM of spine, hips , shoulders and reduced in  knees.  deformity ,swelling and  crepitus noted in knees  No muscle wasting or atrophy.   Neurologic: Cranial nerves 2 to 12 intact. Power, tone ,sensation and reflexes normal throughout.  disturbance in gait. No tremor.  Skin: Intact, no ulceration, erythema , scaling or rash noted. Pigmentation normal throughout  Psych; Normal mood and affect. Judgement and concentration normal   Assessment & Plan:  Annual physical exam Annual exam as documented. Counseling done  re healthy lifestyle involving commitment to 150 minutes exercise per week, heart healthy diet, and attaining healthy weight.The importance of adequate sleep also  discussed. Regular seat belt use and home safety, is also discussed. Changes in health habits are decided on by the patient with goals and time frames  set for achieving them. Immunization and cancer screening needs are specifically addressed at this visit.   Muscle spasm Related to severe spinal stenosis, gradually increase nocturnal gabapentin to 300 mg if tolerated and beneficial, currently on 100 mg  only  Depression, major, single episode, moderate (HCC) Resolved and ion no medication,symptoms of depression related mainly to pain   Nocturnal leg cramps Gradual up titration of gabapentin to 300 mg at bedtime to see if beneficial. Re check calcium and magnesium levels also

## 2019-06-06 NOTE — Patient Instructions (Addendum)
Wellness with NP when due please   F/U with mD in 6 months, call if you need me sooner  Flu vaccine today  Increase gabapentin to 2 at bedtime, and is  Still a lot of night time cramps after 1 week , in crease to 3 at bedtime  Labs today,  CBC, vit D, cmp and EGFr, lipid profile , magnesium   Information re chair exercises is provided, enjoy!  Thanks for choosing [facility], we consider it a privelige to serve you.

## 2019-06-07 LAB — CBC
HCT: 40.5 % (ref 35.0–45.0)
Hemoglobin: 13.7 g/dL (ref 11.7–15.5)
MCH: 31.9 pg (ref 27.0–33.0)
MCHC: 33.8 g/dL (ref 32.0–36.0)
MCV: 94.4 fL (ref 80.0–100.0)
MPV: 10.4 fL (ref 7.5–12.5)
Platelets: 266 10*3/uL (ref 140–400)
RBC: 4.29 10*6/uL (ref 3.80–5.10)
RDW: 12.2 % (ref 11.0–15.0)
WBC: 5.7 10*3/uL (ref 3.8–10.8)

## 2019-06-07 LAB — LIPID PANEL
Cholesterol: 219 mg/dL — ABNORMAL HIGH (ref <200)
HDL: 50 mg/dL (ref >=50)
LDL Cholesterol (Calc): 139 mg/dL (calc) — ABNORMAL HIGH
Non-HDL Cholesterol (Calc): 169 mg/dL (calc) — ABNORMAL HIGH (ref <130)
Total CHOL/HDL Ratio: 4.4 (calc) (ref <5.0)
Triglycerides: 160 mg/dL — ABNORMAL HIGH (ref <150)

## 2019-06-07 LAB — COMPLETE METABOLIC PANEL WITH GFR
AG Ratio: 1.5 (calc) (ref 1.0–2.5)
ALT: 13 U/L (ref 6–29)
AST: 18 U/L (ref 10–35)
Albumin: 4.5 g/dL (ref 3.6–5.1)
Alkaline phosphatase (APISO): 53 U/L (ref 37–153)
BUN: 23 mg/dL (ref 7–25)
CO2: 27 mmol/L (ref 20–32)
Calcium: 11 mg/dL — ABNORMAL HIGH (ref 8.6–10.4)
Chloride: 102 mmol/L (ref 98–110)
Creat: 0.83 mg/dL (ref 0.60–0.93)
GFR, Est African American: 78 mL/min/{1.73_m2} (ref >=60)
GFR, Est Non African American: 68 mL/min/{1.73_m2} (ref >=60)
Globulin: 3 g/dL (calc) (ref 1.9–3.7)
Glucose, Bld: 95 mg/dL (ref 65–99)
Potassium: 4.6 mmol/L (ref 3.5–5.3)
Sodium: 136 mmol/L (ref 135–146)
Total Bilirubin: 0.8 mg/dL (ref 0.2–1.2)
Total Protein: 7.5 g/dL (ref 6.1–8.1)

## 2019-06-07 LAB — VITAMIN D 25 HYDROXY (VIT D DEFICIENCY, FRACTURES): Vit D, 25-Hydroxy: 33 ng/mL (ref 30–100)

## 2019-06-07 LAB — MAGNESIUM: Magnesium: 1.9 mg/dL (ref 1.5–2.5)

## 2019-06-09 ENCOUNTER — Encounter: Payer: Self-pay | Admitting: Family Medicine

## 2019-06-09 DIAGNOSIS — F321 Major depressive disorder, single episode, moderate: Secondary | ICD-10-CM | POA: Insufficient documentation

## 2019-06-09 DIAGNOSIS — G4762 Sleep related leg cramps: Secondary | ICD-10-CM | POA: Insufficient documentation

## 2019-06-09 NOTE — Assessment & Plan Note (Signed)
Resolved and ion no medication,symptoms of depression related mainly to pain

## 2019-06-09 NOTE — Assessment & Plan Note (Signed)
Gradual up titration of gabapentin to 300 mg at bedtime to see if beneficial. Re check calcium and magnesium levels also

## 2019-06-09 NOTE — Assessment & Plan Note (Signed)
Related to severe spinal stenosis, gradually increase nocturnal gabapentin to 300 mg if tolerated and beneficial, currently on 100 mg  only

## 2019-06-11 ENCOUNTER — Telehealth: Payer: Self-pay | Admitting: "Endocrinology

## 2019-06-11 DIAGNOSIS — E213 Hyperparathyroidism, unspecified: Secondary | ICD-10-CM

## 2019-06-11 NOTE — Telephone Encounter (Signed)
Yes, I want her to return sooner with blood labs: PTH/Ca, Phosphorous, Magnesium.

## 2019-06-11 NOTE — Telephone Encounter (Signed)
Pt said that Dr Moshe Cipro advised her that her calcium was high and wanted to know if you needed to see her.

## 2019-06-12 ENCOUNTER — Other Ambulatory Visit: Payer: Self-pay

## 2019-06-12 DIAGNOSIS — E213 Hyperparathyroidism, unspecified: Secondary | ICD-10-CM | POA: Diagnosis not present

## 2019-06-12 NOTE — Telephone Encounter (Signed)
Left msg for pt to call and schedule appt. Lab order in [facility]

## 2019-06-14 DIAGNOSIS — H31012 Macula scars of posterior pole (postinflammatory) (post-traumatic), left eye: Secondary | ICD-10-CM | POA: Diagnosis not present

## 2019-06-14 DIAGNOSIS — H524 Presbyopia: Secondary | ICD-10-CM | POA: Diagnosis not present

## 2019-06-14 DIAGNOSIS — H25813 Combined forms of age-related cataract, bilateral: Secondary | ICD-10-CM | POA: Diagnosis not present

## 2019-06-14 DIAGNOSIS — H47292 Other optic atrophy, left eye: Secondary | ICD-10-CM | POA: Diagnosis not present

## 2019-06-14 LAB — PTH, INTACT AND CALCIUM
Calcium: 10.1 mg/dL (ref 8.6–10.4)
PTH: 33 pg/mL (ref 14–64)

## 2019-06-14 LAB — PHOSPHORUS: Phosphorus: 3.1 mg/dL (ref 2.1–4.3)

## 2019-06-14 LAB — MAGNESIUM: Magnesium: 1.6 mg/dL (ref 1.5–2.5)

## 2019-06-19 ENCOUNTER — Other Ambulatory Visit: Payer: Self-pay

## 2019-06-19 ENCOUNTER — Ambulatory Visit (INDEPENDENT_AMBULATORY_CARE_PROVIDER_SITE_OTHER): Payer: PPO | Admitting: Family Medicine

## 2019-06-19 ENCOUNTER — Encounter: Payer: Self-pay | Admitting: "Endocrinology

## 2019-06-19 ENCOUNTER — Encounter: Payer: Self-pay | Admitting: Family Medicine

## 2019-06-19 ENCOUNTER — Ambulatory Visit (INDEPENDENT_AMBULATORY_CARE_PROVIDER_SITE_OTHER): Payer: PPO | Admitting: "Endocrinology

## 2019-06-19 VITALS — BP 130/82 | HR 106 | Resp 15 | Ht 64.0 in | Wt 157.0 lb

## 2019-06-19 DIAGNOSIS — E559 Vitamin D deficiency, unspecified: Secondary | ICD-10-CM | POA: Diagnosis not present

## 2019-06-19 DIAGNOSIS — E213 Hyperparathyroidism, unspecified: Secondary | ICD-10-CM | POA: Diagnosis not present

## 2019-06-19 DIAGNOSIS — Z0000 Encounter for general adult medical examination without abnormal findings: Secondary | ICD-10-CM

## 2019-06-19 NOTE — Patient Instructions (Signed)
Kristen Cox , Thank you for taking time to come for your Medicare Wellness Visit. I appreciate your ongoing commitment to your health goals. Please review the following plan we discussed and let me know if I can assist you in the future.   Please continue to practice social distancing to keep you, your family, and our community safe.  If you must go out, please wear a Mask and practice good handwashing.  We hope that you have a happy safe and wonderful holiday season.  Please let us know if you need [place] before your next appointment next year.  Screening recommendations/referrals: Colonoscopy: No longer needed Mammogram: completed Bone Density: completed Recommended yearly ophthalmology/optometry visit for glaucoma screening and checkup Recommended yearly dental visit for hygiene and checkup  Vaccinations: Influenza vaccine: Completed, due again 2021 fall Pneumococcal vaccine: Completed  Tdap vaccine: Due 2022 Shingles vaccine: Insurance would not cover  Advanced directives: Completed  Conditions/risks identified: Falls  Next appointment: 12/05/2019  Preventive Care 65 Years and Older, Female Preventive care refers to lifestyle choices and visits with your health care provider that can promote health and wellness. What does preventive care include?  A yearly physical exam. This is also called an annual well check.  Dental exams once or twice a year.  Routine eye exams. Ask your health care provider how often you should have your eyes checked.  Personal lifestyle choices, including:  Daily care of your teeth and gums.  Regular physical activity.  Eating a healthy diet.  Avoiding tobacco and drug use.  Limiting alcohol use.  Practicing safe sex.  Taking low-dose aspirin every day.  Taking vitamin and mineral supplements as recommended by your health care provider. What happens during an annual well check? The services and screenings done by your health care provider  during your annual well check will depend on your age, overall health, lifestyle risk factors, and family history of disease. Counseling  Your health care provider may ask you questions about your:  Alcohol use.  Tobacco use.  Drug use.  Emotional well-being.  Home and relationship well-being.  Sexual activity.  Eating habits.  History of falls.  Memory and ability to understand (cognition).  Work and work Statistician.  Reproductive health. Screening  You may have the following tests or measurements:  Height, weight, and BMI.  Blood pressure.  Lipid and cholesterol levels. These may be checked every 5 years, or more frequently if you are over 62 years old.  Skin check.  Lung cancer screening. You may have this screening every year starting at age 30 if you have a 30-pack-year history of smoking and currently smoke or have quit within the past 15 years.  Fecal occult blood test (FOBT) of the stool. You may have this test every year starting at age 49.  Flexible sigmoidoscopy or colonoscopy. You may have a sigmoidoscopy every 5 years or a colonoscopy every 10 years starting at age 62.  Hepatitis C blood test.  Hepatitis B blood test.  Sexually transmitted disease (STD) testing.  Diabetes screening. This is done by checking your blood sugar (glucose) after you have not eaten for a while (fasting). You may have this done every 1-3 years.  Bone density scan. This is done to screen for osteoporosis. You may have this done starting at age 13.  Mammogram. This may be done every 1-2 years. Talk to your health care provider about how often you should have regular mammograms. Talk with your health care provider about your test  results, treatment options, and if necessary, the need for more tests. Vaccines  Your health care provider may recommend certain vaccines, such as:  Influenza vaccine. This is recommended every year.  Tetanus, diphtheria, and acellular pertussis  (Tdap, Td) vaccine. You may need a Td booster every 10 years.  Zoster vaccine. You may need this after age 58.  Pneumococcal 13-valent conjugate (PCV13) vaccine. One dose is recommended after age 57.  Pneumococcal polysaccharide (PPSV23) vaccine. One dose is recommended after age 45. Talk to your health care provider about which screenings and vaccines you need and how often you need them. This information is not intended to replace advice given to you by your health care provider. Make sure you discuss any questions you have with your health care provider. Document Released: 09/05/2015 Document Revised: 04/28/2016 Document Reviewed: 06/10/2015 Elsevier Interactive Patient Education  2017 [place] Prevention in the Home Falls can cause injuries. They can happen to people of all ages. There are many things you can do to make your home safe and to help prevent falls. What can I do on the outside of my home?  Regularly fix the edges of walkways and driveways and fix any cracks.  Remove anything that might make you trip as you walk through a door, such as a raised step or threshold.  Trim any bushes or trees on the path to your home.  Use bright outdoor lighting.  Clear any walking paths of anything that might make someone trip, such as rocks or tools.  Regularly check to see if handrails are loose or broken. Make sure that both sides of any steps have handrails.  Any raised decks and porches should have guardrails on the edges.  Have any leaves, snow, or ice cleared regularly.  Use sand or salt on walking paths during winter.  Clean up any spills in your garage right away. This includes oil or grease spills. What can I do in the bathroom?  Use night lights.  Install grab bars by the toilet and in the tub and shower. Do not use towel bars as grab bars.  Use non-skid mats or decals in the tub or shower.  If you need to sit down in the shower, use a plastic, non-slip  stool.  Keep the floor dry. Clean up any water that spills on the floor as soon as it happens.  Remove soap buildup in the tub or shower regularly.  Attach bath mats securely with double-sided non-slip rug tape.  Do not have throw rugs and other things on the floor that can make you trip. What can I do in the bedroom?  Use night lights.  Make sure that you have a light by your bed that is easy to reach.  Do not use any sheets or blankets that are too big for your bed. They should not hang down onto the floor.  Have a firm chair that has side arms. You can use this for support while you get dressed.  Do not have throw rugs and other things on the floor that can make you trip. What can I do in the kitchen?  Clean up any spills right away.  Avoid walking on wet floors.  Keep items that you use a lot in easy-to-reach places.  If you need to reach something above you, use a strong step stool that has a grab bar.  Keep electrical cords out of the way.  Do not use floor polish or wax that makes  floors slippery. If you must use wax, use non-skid floor wax.  Do not have throw rugs and other things on the floor that can make you trip. What can I do with my stairs?  Do not leave any items on the stairs.  Make sure that there are handrails on both sides of the stairs and use them. Fix handrails that are broken or loose. Make sure that handrails are as long as the stairways.  Check any carpeting to make sure that it is firmly attached to the stairs. Fix any carpet that is loose or worn.  Avoid having throw rugs at the top or bottom of the stairs. If you do have throw rugs, attach them to the floor with carpet tape.  Make sure that you have a light switch at the top of the stairs and the bottom of the stairs. If you do not have them, ask someone to add them for you. What else can I do to help prevent falls?  Wear shoes that:  Do not have high heels.  Have rubber bottoms.  Are  comfortable and fit you well.  Are closed at the toe. Do not wear sandals.  If you use a stepladder:  Make sure that it is fully opened. Do not climb a closed stepladder.  Make sure that both sides of the stepladder are locked into place.  Ask someone to hold it for you, if possible.  Clearly mark and make sure that you can see:  Any grab bars or handrails.  First and last steps.  Where the edge of each step is.  Use tools that help you move around (mobility aids) if they are needed. These include:  Canes.  Walkers.  Scooters.  Crutches.  Turn on the lights when you go into a dark area. Replace any light bulbs as soon as they burn out.  Set up your furniture so you have a clear path. Avoid moving your furniture around.  If any of your floors are uneven, fix them.  If there are any pets around you, be aware of where they are.  Review your medicines with your doctor. Some medicines can make you feel dizzy. This can increase your chance of falling. Ask your doctor what other things that you can do to help prevent falls. This information is not intended to replace advice given to you by your health care provider. Make sure you discuss any questions you have with your health care provider. Document Released: 06/05/2009 Document Revised: 01/15/2016 Document Reviewed: 09/13/2014 Elsevier Interactive Patient Education  2017 [employer].

## 2019-06-19 NOTE — Progress Notes (Signed)
Subjective:   Kristen Cox is a 78 y.o. female who presents for Medicare Annual (Subsequent) preventive examination.  Location of Patient: Home Location of Provider: Telehealth Consent was obtain for visit to be over via telehealth. I verified that I am speaking with the correct person using two identifiers.   Review of Systems:    Cardiac Risk Factors include: advanced age (>77men, >64 women);hypertension     Objective:     Vitals: BP 130/82   Pulse (!) 106   Resp 15   Ht 5\' 4"  (1.626 m)   Wt 157 lb (71.2 kg)   BMI 26.95 kg/m   Body mass index is 26.95 kg/m.  Advanced Directives 12/20/2017 11/08/2017 02/14/2017 05/19/2016  Does Patient Have a Medical Advance Directive? No No No No  Would patient like information on creating a medical advance directive? Yes (MAU/Ambulatory/Procedural Areas - Information given) - Yes (MAU/Ambulatory/Procedural Areas - Information given) No - patient declined information    Tobacco Social History   Tobacco Use  Smoking Status Former Smoker  . Packs/day: 0.50  . Years: 10.00  . Pack years: 5.00  . Types: Cigarettes  . Quit date: 10/07/1983  . Years since quitting: 35.7  Smokeless Tobacco Never Used     Counseling given: Yes   Clinical Intake:  Pre-visit preparation completed: Yes  Pain : No/denies pain Pain Score: 0-No pain     BMI - recorded: 26.95 Nutritional Status: BMI 25 -29 Overweight Nutritional Risks: None Diabetes: No  How often do you need to have someone help you when you read instructions, pamphlets, or other written materials from your doctor or pharmacy?: 1 - Never What is the last grade level you completed in school?: 10  Interpreter Needed?: No     Past Medical History:  Diagnosis Date  . Deep vein thrombosis (DVT) (HCC) 08/2016   Right leg  . Dry eyes 2010   on restasis - Dr. Jorja Loa   . Gout 2009  . Hyperlipidemia   . Hypertension   . Spinal stenosis    Past Surgical History:  Procedure  Laterality Date  . CESAREAN SECTION    . COLONOSCOPY  04/09/2009   [id number] papilla otherwise, normal rectum/Left-sided diverticula/Cecal polyp status post cold snare and removal/The remainder of the colonic mucosa appeared normal. Adenomatous polyps, next TCS 03/2016 per RMR.  . COLONOSCOPY N/A 05/19/2016   Procedure: COLONOSCOPY;  Surgeon: Daneil Dolin, MD;  Location: [facility];  Service: Endoscopy;  Laterality: N/A;  1:00 PM   Family History  Problem Relation Age of Onset  . Stroke Mother   . Stroke Father   . Diabetes Sister   . Pulmonary embolism Sister   . Cirrhosis Brother   . Colon cancer Neg Hx    Social History   Socioeconomic History  . Marital status: Widowed    Spouse name: Not on file  . Number of children: 2  . Years of education: Not on file  . Highest education level: 10th grade  Occupational History  . Occupation: retired   Scientific laboratory technician  . Financial resource strain: Somewhat hard  . Food insecurity    Worry: Never true    Inability: Never true  . Transportation needs    Medical: No    Non-medical: No  Tobacco Use  . Smoking status: Former Smoker    Packs/day: 0.50    Years: 10.00    Pack years: 5.00    Types: Cigarettes    Quit date: 10/07/1983  Years since quitting: 35.7  . Smokeless tobacco: Never Used  Substance and Sexual Activity  . Alcohol use: No  . Drug use: No  . Sexual activity: Never  Lifestyle  . Physical activity    Days per week: 0 days    Minutes per session: 0 min  . Stress: Only a little  Relationships  . Social Herbalist on phone: Three times a week    Gets together: Once a week    Attends religious service: 1 to 4 times per year    Active member of club or organization: No    Attends meetings of clubs or organizations: Never    Relationship status: Widowed  Other Topics Concern  . Not on file  Social History Narrative  . Not on file    Outpatient Encounter Medications as of 06/19/2019  Medication  Sig  . amLODipine (NORVASC) 10 MG tablet TAKE 1 TABLET(10 MG) BY MOUTH DAILY  . aspirin EC 81 MG tablet Take 81 mg by mouth daily.  . Flaxseed, Linseed, (FLAXSEED OIL) 1000 MG CAPS Take 1 capsule by mouth daily.  Marland Kitchen gabapentin (NEURONTIN) 100 MG capsule Take 1 capsule (100 mg total) by mouth 3 (three) times daily.  . montelukast (SINGULAIR) 10 MG tablet TAKE 1 TABLET(10 MG) BY MOUTH AT BEDTIME  . Polyethyl Glycol-Propyl Glycol (SYSTANE) 0.4-0.3 % SOLN Apply 1 drop to eye 2 (two) times daily.  Marland Kitchen spironolactone (ALDACTONE) 50 MG tablet TAKE 1 TABLET BY MOUTH DAILY   No facility-administered encounter medications on file as of 06/19/2019.     Activities of Daily Living In your present state of health, do you have any difficulty performing the following activities: 06/19/2019  Hearing? N  Vision? N  Difficulty concentrating or making decisions? N  Walking or climbing stairs? N  Dressing or bathing? N  Doing errands, shopping? N  Preparing Food and eating ? N  Using the Toilet? N  In the past six months, have you accidently leaked urine? N  Do you have problems with loss of bowel control? N  Managing your Medications? N  Managing your Finances? N  Housekeeping or managing your Housekeeping? N  Some recent data might be hidden    Patient Care Team: Fayrene Helper, MD as PCP - General Rourk, Cristopher Estimable, MD as Consulting Physician (Gastroenterology) Cassandria Anger, MD as Consulting Physician (Endocrinology) Florian Buff, MD as Consulting Physician (Obstetrics and Gynecology) Sydnee Cabal, MD as Consulting Physician (Orthopedic Surgery)    Assessment:   This is a routine wellness examination for [name].  Exercise Activities and Dietary recommendations Current Exercise Habits: The patient does not participate in regular exercise at present, Exercise limited by: orthopedic condition(s)  Goals    . Exercise 3x per week (30 min per time)     Recommend starting a routine  exercise program at least 3 days a week for 30-45 minutes at a time as tolerated.         Fall Risk Fall Risk  06/19/2019 01/16/2019 11/09/2018 09/06/2018 04/18/2018  Falls in the past year? 0 0 0 0 No  Number falls in past yr: 0 0 0 - -  Injury with Fall? 0 0 0 - -  Risk for fall due to : - - - - -  Risk for fall due to: Comment - - - - -   Is the patient's home free of loose throw rugs in walkways, pet beds, electrical cords, etc?   yes  Grab bars in the bathroom? yes      Handrails on the stairs?   yes      Adequate lighting?   yes     Depression Screen PHQ 2/9 Scores 06/19/2019 06/06/2019 11/09/2018 09/06/2018  PHQ - 2 Score 0 0 0 4  PHQ- 9 Score - - - 13     Cognitive Function     6CIT Screen 06/19/2019 12/20/2017 02/14/2017  What Year? 0 points 0 points 0 points  What month? 0 points 0 points 0 points  What time? 0 points 0 points 0 points  Count back from 20 0 points 0 points 0 points  Months in reverse 0 points 0 points 0 points  Repeat phrase 2 points 0 points 0 points  Total Score 2 0 0    Immunization History  Administered Date(s) Administered  . Fluad Quad(high Dose 65+) 06/06/2019  . Influenza,inj,Quad PF,6+ Mos 06/12/2014, 08/21/2015, 07/08/2016, 06/23/2017, 04/18/2018  . Pneumococcal Conjugate-13 11/13/2014  . Pneumococcal Polysaccharide-23 02/17/2016  . Tdap 02/01/2011    Qualifies for Shingles Vaccine? insurance didn't covered  Screening Tests Health Maintenance  Topic Date Due  . TETANUS/TDAP  01/31/2021  . INFLUENZA VACCINE  Completed  . DEXA SCAN  Completed  . PNA vac Low Risk Adult  Completed    Cancer Screenings: Lung: Low Dose CT Chest recommended if Age 79-80 years, 30 pack-year currently smoking OR have quit w/in 15years. Patient does not qualify. Breast:  Up to date on Mammogram? Yes   Up to date of Bone Density/Dexa? Yes Colorectal: n/a  Additional Screenings:   Hepatitis C Screening:      Plan:        1. Encounter for  Medicare annual wellness exam   I have personally reviewed and noted the following in the patient's chart:   . Medical and social history . Use of alcohol, tobacco or illicit drugs  . Current medications and supplements . Functional ability and status . Nutritional status . Physical activity . Advanced directives . List of other physicians . Hospitalizations, surgeries, and ER visits in previous 12 months . Vitals . Screenings to include cognitive, depression, and falls . Referrals and appointments  In addition, I have reviewed and discussed with patient certain preventive protocols, quality metrics, and best practice recommendations. A written personalized care plan for preventive services as well as general preventive health recommendations were provided to patient.     I provided 20 minutes of non-face-to-face time during this encounter.   Perlie Mayo, NP  06/19/2019

## 2019-06-19 NOTE — Progress Notes (Signed)
06/19/2019                                Endocrinology Telehealth Visit Follow up Note -During COVID -19 Pandemic  I connected with Kristen Cox on 06/19/2019   by telephone and verified that I am speaking with the correct person using two identifiers. Kristen Cox, Apr 02, 1941. she has verbally consented to this visit. All issues noted in this document were discussed and addressed. The format was not optimal for physical exam.  Subjective:    Patient ID: Kristen Cox, female    DOB: [birth year], PCP Kristen Helper, MD   Past Medical History:  Diagnosis Date  . Deep vein thrombosis (DVT) ([facility]) 08/2016   Right leg  . Dry eyes 2010   on restasis - Dr. Jorja Loa   . Gout 2009  . Hyperlipidemia   . Hypertension   . Spinal stenosis    Past Surgical History:  Procedure Laterality Date  . CESAREAN SECTION    . COLONOSCOPY  04/09/2009   [id number] papilla otherwise, normal rectum/Left-sided diverticula/Cecal polyp status post cold snare and removal/The remainder of the colonic mucosa appeared normal. Adenomatous polyps, next TCS 03/2016 per RMR.  . COLONOSCOPY N/A 05/19/2016   Procedure: COLONOSCOPY;  Surgeon: Daneil Dolin, MD;  Location: [facility];  Service: Endoscopy;  Laterality: N/A;  1:00 PM   Social History   Socioeconomic History  . Marital status: Widowed    Spouse name: Not on file  . Number of children: 2  . Years of education: Not on file  . Highest education level: 10th grade  Occupational History  . Occupation: retired   Scientific laboratory technician  . Financial resource strain: Somewhat hard  . Food insecurity    Worry: Never true    Inability: Never true  . Transportation needs    Medical: No    Non-medical: No  Tobacco Use  . Smoking status: Former Smoker    Packs/day: 0.50    Years: 10.00    Pack years: 5.00    Types: Cigarettes    Quit date: 10/07/1983    Years since quitting: 35.7  . Smokeless tobacco: Never Used  Substance and Sexual Activity  .  Alcohol use: No  . Drug use: No  . Sexual activity: Never  Lifestyle  . Physical activity    Days per week: 0 days    Minutes per session: 0 min  . Stress: Only a little  Relationships  . Social Herbalist on phone: Three times a week    Gets together: Once a week    Attends religious service: 1 to 4 times per year    Active member of club or organization: No    Attends meetings of clubs or organizations: Never    Relationship status: Widowed  Other Topics Concern  . Not on file  Social History Narrative  . Not on file   Outpatient Encounter Medications as of 06/19/2019  Medication Sig  . Vitamin D, Cholecalciferol, 50 MCG (2000 UT) CAPS Take by mouth.  Marland Kitchen amLODipine (NORVASC) 10 MG tablet TAKE 1 TABLET(10 MG) BY MOUTH DAILY  . aspirin EC 81 MG tablet Take 81 mg by mouth daily.  . Flaxseed, Linseed, (FLAXSEED OIL) 1000 MG CAPS Take 1 capsule by mouth daily.  Marland Kitchen gabapentin (NEURONTIN) 100 MG capsule Take 1 capsule (100 mg total) by mouth 3 (three) times daily.  Marland Kitchen  montelukast (SINGULAIR) 10 MG tablet TAKE 1 TABLET(10 MG) BY MOUTH AT BEDTIME  . Polyethyl Glycol-Propyl Glycol (SYSTANE) 0.4-0.3 % SOLN Apply 1 drop to eye 2 (two) times daily.  Marland Kitchen spironolactone (ALDACTONE) 50 MG tablet TAKE 1 TABLET BY MOUTH DAILY   No facility-administered encounter medications on file as of 06/19/2019.    ALLERGIES: Allergies  Allergen Reactions  . Benazepril Other (See Comments)    Patient does not recall being on this medication and/or what reaction she might have had.  . Metoprolol     CRAMPS   VACCINATION STATUS: Immunization History  Administered Date(s) Administered  . Fluad Quad(high Dose 65+) 06/06/2019  . Influenza,inj,Quad PF,6+ Mos 06/12/2014, 08/21/2015, 07/08/2016, 06/23/2017, 04/18/2018  . Pneumococcal Conjugate-13 11/13/2014  . Pneumococcal Polysaccharide-23 02/17/2016  . Tdap 02/01/2011    HPI  78 year old female with medical history as above.  She is being  engaged in telehealth via telephone in follow-up for hypercalcemia related to mild hyperparathyroidism.  She is currently on observation only.  Did not require surgical intervention.  -Her previsit labs show calcium improving to 10.1 mg per DL from 11 mg.,  As well as PTH of 33 improving from 75.  She is on treatment for hypovitaminosis D.     Her prior 24 hour urine calcium has been low at 44,  and was WNL at 118 last check.  She is not currently on calcium supplements.  She denies any family history of parathyroid dysfunction.  She denies any hx of CVA, seizures, nephrolithiasis, nor CKD. she denies Osteoporosis. she is a former smoker.  She did not have bone density since 2015. she denies any new complaints.  Review of Systems  Limited as above.   Objective:    There were no vitals taken for this visit.  Wt Readings from Last 3 Encounters:  06/19/19 157 lb (71.2 kg)  06/06/19 157 lb (71.2 kg)  04/16/19 153 lb (69.4 kg)    Physical Exam  Diabetic Labs (most recent): Lab Results  Component Value Date   HGBA1C 5.6 07/01/2016   HGBA1C 6.1 (H) 10/30/2015   HGBA1C 5.9 (H) 03/19/2015    Lipid Panel     Component Value Date/Time   CHOL 219 (H) 06/06/2019 1422   TRIG 160 (H) 06/06/2019 1422   HDL 50 06/06/2019 1422   CHOLHDL 4.4 06/06/2019 1422   VLDL 19 02/11/2016 0718   LDLCALC 139 (H) 06/06/2019 1422   Recent Results (from the past 2160 hour(s))  CBC     Status: None   Collection Time: 06/06/19  2:22 PM  Result Value Ref Range   WBC 5.7 3.8 - 10.8 Thousand/uL   RBC 4.29 3.80 - 5.10 Million/uL   Hemoglobin 13.7 11.7 - 15.5 g/dL   HCT 40.5 35.0 - 45.0 %   MCV 94.4 80.0 - 100.0 fL   MCH 31.9 27.0 - 33.0 pg   MCHC 33.8 32.0 - 36.0 g/dL   RDW 12.2 11.0 - 15.0 %   Platelets 266 140 - 400 Thousand/uL   MPV 10.4 7.5 - 12.5 fL  VITAMIN D 25 Hydroxy (Vit-D Deficiency, Fractures)     Status: None   Collection Time: 06/06/19  2:22 PM  Result Value Ref Range   Vit D,  25-Hydroxy 33 30 - 100 ng/mL    Comment: Vitamin D Status         25-OH Vitamin D: . Deficiency:                    <  20 ng/mL Insufficiency:             20 - 29 ng/mL Optimal:                 > or = 30 ng/mL . For 25-OH Vitamin D testing on patients on  D2-supplementation and patients for whom quantitation  of D2 and D3 fractions is required, the QuestAssureD(TM) 25-OH VIT D, (D2,D3), LC/MS/MS is recommended: order  code [id number] (patients >62yrs). See Note 1 . Note 1 . For additional information, please refer to  http://education.[facility].com/faq/FAQ199  (This link is being provided for informational/ educational purposes only.)   COMPLETE METABOLIC PANEL WITH GFR     Status: Abnormal   Collection Time: 06/06/19  2:22 PM  Result Value Ref Range   Glucose, Bld 95 65 - 99 mg/dL    Comment: .            Fasting reference interval .    BUN 23 7 - 25 mg/dL   Creat 0.83 0.60 - 0.93 mg/dL    Comment: For patients >30 years of age, the reference limit for Creatinine is approximately 13% higher for people identified as African-American. .    GFR, Est Non African American 68 > OR = 60 mL/min/1.71m2   GFR, Est African American 78 > OR = 60 mL/min/1.5m2   BUN/Creatinine Ratio NOT APPLICABLE 6 - 22 (calc)   Sodium 136 135 - 146 mmol/L   Potassium 4.6 3.5 - 5.3 mmol/L   Chloride 102 98 - 110 mmol/L   CO2 27 20 - 32 mmol/L   Calcium 11.0 (H) 8.6 - 10.4 mg/dL   Total Protein 7.5 6.1 - 8.1 g/dL   Albumin 4.5 3.6 - 5.1 g/dL   Globulin 3.0 1.9 - 3.7 g/dL (calc)   AG Ratio 1.5 1.0 - 2.5 (calc)   Total Bilirubin 0.8 0.2 - 1.2 mg/dL   Alkaline phosphatase (APISO) 53 37 - 153 U/L   AST 18 10 - 35 U/L   ALT 13 6 - 29 U/L  Lipid panel     Status: Abnormal   Collection Time: 06/06/19  2:22 PM  Result Value Ref Range   Cholesterol 219 (H) <200 mg/dL   HDL 50 > OR = 50 mg/dL   Triglycerides 160 (H) <150 mg/dL   LDL Cholesterol (Calc) 139 (H) mg/dL (calc)    Comment: Reference  range: <100 . Desirable range <100 mg/dL for primary prevention;   <70 mg/dL for patients with CHD or diabetic patients  with > or = 2 CHD risk factors. Marland Kitchen LDL-C is now calculated using the Martin-Hopkins  calculation, which is a validated novel method providing  better accuracy than the Friedewald equation in the  estimation of LDL-C.  Cresenciano Genre et al. Annamaria Helling. [id number]): 2061-2068  (http://education.[facility].com/faq/FAQ164)    Total CHOL/HDL Ratio 4.4 <5.0 (calc)   Non-HDL Cholesterol (Calc) 169 (H) <130 mg/dL (calc)    Comment: For patients with diabetes plus 1 major ASCVD risk  factor, treating to a non-HDL-C goal of <100 mg/dL  (LDL-C of <70 mg/dL) is considered a therapeutic  option.   Magnesium     Status: None   Collection Time: 06/06/19  2:22 PM  Result Value Ref Range   Magnesium 1.9 1.5 - 2.5 mg/dL  Magnesium     Status: None   Collection Time: 06/12/19  9:56 AM  Result Value Ref Range   Magnesium 1.6 1.5 - 2.5 mg/dL  Phosphorus     Status: None  Collection Time: 06/12/19  9:56 AM  Result Value Ref Range   Phosphorus 3.1 2.1 - 4.3 mg/dL  PTH, Intact and Calcium     Status: None   Collection Time: 06/12/19  9:56 AM  Result Value Ref Range   PTH 33 14 - 64 pg/mL    Comment: . Interpretive Guide    Intact PTH           Calcium ------------------    ----------           ------- Normal Parathyroid    Normal               Normal Hypoparathyroidism    Low or Low Normal    Low Hyperparathyroidism    Primary            Normal or High       High    Secondary          High                 Normal or Low    Tertiary           High                 High Non-Parathyroid    Hypercalcemia      Low or Low Normal    High .    Calcium 10.1 8.6 - 10.4 mg/dL    Assessment & Plan:   1. Hypercalcemia Her repeat lab results are reviewed with her, her PTH is 33 improving from 75 along with improved calcium of 10.1.  This is still consistent with mild primary  hyperparathyroidism.    -She will not require intervention at this time, not a surgical candidate.   -  I discussed the potential complications of untreated hypercalcemia.  She is benefiting from continued vitamin D supplement, currently 2000 units of vitamin D3 daily. -She is advised to return in 6 months with repeat PTH/calcium.   - I advised patient to maintain close follow up with Kristen Helper, MD for primary care needs.   Time for this visit: 15 minutes. Kristen Mai Clifton  participated in the discussions, expressed understanding, and voiced agreement with the above plans.  All questions were answered to her satisfaction. she is encouraged to contact clinic should she have any questions or concerns prior to her return visit.  Follow up plan: Return in about 6 months (around 12/18/2019) for Follow up with Pre-visit Labs.  Glade Lloyd, MD Phone: [phone]  Fax: [phone]  This note was partially dictated with voice recognition software. Similar sounding words can be transcribed inadequately or may not  be corrected upon review.  06/19/2019, 4:47 PM

## 2019-06-27 ENCOUNTER — Encounter: Payer: PPO | Admitting: Family Medicine

## 2019-06-29 ENCOUNTER — Other Ambulatory Visit: Payer: Self-pay

## 2019-06-29 MED ORDER — AMLODIPINE BESYLATE 10 MG PO TABS: ORAL_TABLET | ORAL | 2 refills | Status: DC

## 2019-07-03 ENCOUNTER — Other Ambulatory Visit: Payer: Self-pay

## 2019-07-03 MED ORDER — SPIRONOLACTONE 50 MG PO TABS: 50.0000 mg | ORAL_TABLET | Freq: Every day | ORAL | 1 refills | Status: DC

## 2019-09-28 ENCOUNTER — Other Ambulatory Visit: Payer: Self-pay | Admitting: Family Medicine

## 2019-10-17 ENCOUNTER — Encounter: Payer: Self-pay | Admitting: Orthopedic Surgery

## 2019-10-17 ENCOUNTER — Ambulatory Visit: Payer: PPO | Admitting: Orthopedic Surgery

## 2019-10-17 ENCOUNTER — Ambulatory Visit: Payer: PPO

## 2019-10-17 ENCOUNTER — Other Ambulatory Visit: Payer: Self-pay

## 2019-10-17 VITALS — BP 163/78 | HR 110 | Temp 98.3°F | Ht 64.0 in | Wt 156.4 lb

## 2019-10-17 DIAGNOSIS — M19041 Primary osteoarthritis, right hand: Secondary | ICD-10-CM | POA: Diagnosis not present

## 2019-10-17 DIAGNOSIS — M1710 Unilateral primary osteoarthritis, unspecified knee: Secondary | ICD-10-CM

## 2019-10-17 DIAGNOSIS — M1712 Unilateral primary osteoarthritis, left knee: Secondary | ICD-10-CM | POA: Diagnosis not present

## 2019-10-17 DIAGNOSIS — M19042 Primary osteoarthritis, left hand: Secondary | ICD-10-CM | POA: Diagnosis not present

## 2019-10-17 MED ORDER — MELOXICAM 7.5 MG PO TABS: 7.5000 mg | ORAL_TABLET | Freq: Every day | ORAL | 5 refills | Status: DC

## 2019-10-17 NOTE — Progress Notes (Signed)
Kristen Cox  10/17/2019  Body mass index is 26.84 kg/m.   HISTORY SECTION :  Chief Complaint  Patient presents with  . Knee Pain    L/sore not really hurting today   HPI 79 year old female here for 1year follow-up bilateral knee pain left greater than right she actually complains of soreness and would like some type of medication to help with that she is already using topical medications  She also complains of pain in her left long finger and left thumb and somewhat in the right thumb noticing a bump on the back of the left thumb    Review of Systems  Musculoskeletal: Positive for joint pain.  Skin: Negative for itching and rash.     has a past medical history of Deep vein thrombosis (DVT) ([place]) (08/2016), Dry eyes (2010), Gout (2009), Hyperlipidemia, Hypertension, and Spinal stenosis.   Past Surgical History:  Procedure Laterality Date  . CESAREAN SECTION    . COLONOSCOPY  04/09/2009   [id number] papilla otherwise, normal rectum/Left-sided diverticula/Cecal polyp status post cold snare and removal/The remainder of the colonic mucosa appeared normal. Adenomatous polyps, next TCS 03/2016 per RMR.  . COLONOSCOPY N/A 05/19/2016   Procedure: COLONOSCOPY;  Surgeon: Daneil Dolin, MD;  Location: [facility];  Service: Endoscopy;  Laterality: N/A;  1:00 PM    Body mass index is 26.84 kg/m.   Allergies  Allergen Reactions  . Benazepril Other (See Comments)    Patient does not recall being on this medication and/or what reaction she might have had.  . Metoprolol     CRAMPS     Current Outpatient Medications:  .  amLODipine (NORVASC) 10 MG tablet, TAKE 1 TABLET(10 MG) BY MOUTH DAILY, Disp: 90 tablet, Rfl: 2 .  aspirin EC 81 MG tablet, Take 81 mg by mouth daily., Disp: , Rfl:  .  Flaxseed, Linseed, (FLAXSEED OIL) 1000 MG CAPS, Take 1 capsule by mouth daily., Disp: , Rfl:  .  gabapentin (NEURONTIN) 100 MG capsule, TAKE 1 CAPSULE BY MOUTH AT BEDTIME FOR BACK PAIN, Disp: 30  capsule, Rfl: 0 .  montelukast (SINGULAIR) 10 MG tablet, TAKE 1 TABLET(10 MG) BY MOUTH AT BEDTIME, Disp: 30 tablet, Rfl: 5 .  Polyethyl Glycol-Propyl Glycol (SYSTANE) 0.4-0.3 % SOLN, Apply 1 drop to eye 2 (two) times daily., Disp: , Rfl:  .  spironolactone (ALDACTONE) 50 MG tablet, Take 1 tablet (50 mg total) by mouth daily., Disp: 90 tablet, Rfl: 1 .  Vitamin D, Cholecalciferol, 50 MCG (2000 UT) CAPS, Take by mouth., Disp: , Rfl:  .  meloxicam (MOBIC) 7.5 MG tablet, Take 1 tablet (7.5 mg total) by mouth daily., Disp: 30 tablet, Rfl: 5   PHYSICAL EXAM SECTION: 1) BP (!) 163/78   Pulse (!) 110   Temp 98.3 F (36.8 C)   Ht 5\' 4"  (1.626 m)   Wt 156 lb 6 oz (70.9 kg)   BMI 26.84 kg/m   Body mass index is 26.84 kg/m. General appearance: Well-developed well-nourished no gross deformities   she has Heberden's and Bouchard's nodes of the small joints of both hands.  She has painful range of motion of the PIP joint dorsally of the left long finger consistent with arthritis there is minimal swelling of that joint.  Her left thumb is partially subluxating she has a positive grind test consistent with CMC arthritis  Bilateral tenderness in the knees primarily in the lateral joint she has some mild tenderness medially from the valgus deformity and stretching  of the medial soft tissue structures.  She has maintained reasonable range of motion of 115 to 120 degrees in both knees with mild crepitance noted on range of motion no quadriceps atrophy is noted    MEDICAL DECISION MAKING  A.  Encounter Diagnoses  Name Primary?  Marland Kitchen Arthritis of knee Yes  . Arthritis of left hand   . Arthritis of right hand     B. DATA ANALYSED:  IMAGING: Independent interpretation of images: In-house x-rays showed 20 degree valgus deformity on the right 13 degree on the left the disease on the right knee and the lateral compartment is much more severe   Outside records reviewed: no  C. MANAGEMENT   She did not  want any injections today preferred oral medication  We recommend topical medications for the Arthritis pain in her hands  Meds ordered this encounter  Medications  . meloxicam (MOBIC) 7.5 MG tablet    Sig: Take 1 tablet (7.5 mg total) by mouth daily.    Dispense:  30 tablet    Refill:  5   (3 chronic problems, prescription management)   Arther Abbott, MD  10/17/2019 9:53 AM

## 2019-10-25 ENCOUNTER — Ambulatory Visit: Payer: PPO | Admitting: "Endocrinology

## 2019-10-26 ENCOUNTER — Other Ambulatory Visit: Payer: Self-pay | Admitting: Family Medicine

## 2019-12-05 ENCOUNTER — Ambulatory Visit: Payer: PPO | Admitting: Family Medicine

## 2019-12-12 DIAGNOSIS — E213 Hyperparathyroidism, unspecified: Secondary | ICD-10-CM | POA: Diagnosis not present

## 2019-12-13 ENCOUNTER — Other Ambulatory Visit: Payer: Self-pay | Admitting: Family Medicine

## 2019-12-13 LAB — VITAMIN D 25 HYDROXY (VIT D DEFICIENCY, FRACTURES): Vit D, 25-Hydroxy: 34 ng/mL (ref 30–100)

## 2019-12-13 LAB — PTH, INTACT AND CALCIUM
Calcium: 11.1 mg/dL — ABNORMAL HIGH (ref 8.6–10.4)
PTH: 41 pg/mL (ref 14–64)

## 2019-12-19 ENCOUNTER — Encounter: Payer: Self-pay | Admitting: "Endocrinology

## 2019-12-19 ENCOUNTER — Other Ambulatory Visit: Payer: Self-pay

## 2019-12-19 ENCOUNTER — Ambulatory Visit: Payer: PPO | Admitting: "Endocrinology

## 2019-12-19 VITALS — BP 155/78 | HR 96 | Ht 64.0 in | Wt 165.0 lb

## 2019-12-19 DIAGNOSIS — E213 Hyperparathyroidism, unspecified: Secondary | ICD-10-CM | POA: Diagnosis not present

## 2019-12-19 DIAGNOSIS — E559 Vitamin D deficiency, unspecified: Secondary | ICD-10-CM

## 2019-12-19 NOTE — Progress Notes (Signed)
12/19/2019                 Endocrinology follow-up note   Subjective:    Patient ID: Kristen Cox, female    DOB: Nov 18, 1940, PCP Fayrene Helper, MD   Past Medical History:  Diagnosis Date  . Deep vein thrombosis (DVT) ([facility]) 08/2016   Right leg  . Dry eyes 2010   on restasis - Dr. Jorja Loa   . Gout 2009  . Hyperlipidemia   . Hypertension   . Spinal stenosis    Past Surgical History:  Procedure Laterality Date  . CESAREAN SECTION    . COLONOSCOPY  04/09/2009   [id number] papilla otherwise, normal rectum/Left-sided diverticula/Cecal polyp status post cold snare and removal/The remainder of the colonic mucosa appeared normal. Adenomatous polyps, next TCS 03/2016 per RMR.  . COLONOSCOPY N/A 05/19/2016   Procedure: COLONOSCOPY;  Surgeon: Daneil Dolin, MD;  Location: [facility];  Service: Endoscopy;  Laterality: N/A;  1:00 PM   Social History   Socioeconomic History  . Marital status: Widowed    Spouse name: Not on file  . Number of children: 2  . Years of education: Not on file  . Highest education level: 10th grade  Occupational History  . Occupation: retired   Tobacco Use  . Smoking status: Former Smoker    Packs/day: 0.50    Years: 10.00    Pack years: 5.00    Types: Cigarettes    Quit date: 10/07/1983    Years since quitting: 36.2  . Smokeless tobacco: Never Used  Substance and Sexual Activity  . Alcohol use: No  . Drug use: No  . Sexual activity: Never  Other Topics Concern  . Not on file  Social History Narrative  . Not on file   Social Determinants of Health   Financial Resource Strain:   . Difficulty of Paying Living Expenses:   Food Insecurity:   . Worried About Charity fundraiser in the Last Year:   . Arboriculturist in the Last Year:   Transportation Needs:   . Film/video editor (Medical):   Marland Kitchen Lack of Transportation (Non-Medical):   Physical Activity:   . Days of Exercise per Week:   . Minutes of Exercise per Session:    Stress:   . Feeling of Stress :   Social Connections:   . Frequency of Communication with Friends and Family:   . Frequency of Social Gatherings with Friends and Family:   . Attends Religious Services:   . Active Member of Clubs or Organizations:   . Attends Archivist Meetings:   Marland Kitchen Marital Status:    Outpatient Encounter Medications as of 12/19/2019  Medication Sig  . amLODipine (NORVASC) 10 MG tablet TAKE 1 TABLET(10 MG) BY MOUTH DAILY  . aspirin EC 81 MG tablet Take 81 mg by mouth daily.  . Flaxseed, Linseed, (FLAXSEED OIL) 1000 MG CAPS Take 1 capsule by mouth daily.  Marland Kitchen gabapentin (NEURONTIN) 100 MG capsule TAKE 1 CAPSULE BY MOUTH AT BEDTIME FOR BACK PAIN  . meloxicam (MOBIC) 7.5 MG tablet Take 1 tablet (7.5 mg total) by mouth daily.  . montelukast (SINGULAIR) 10 MG tablet TAKE 1 TABLET(10 MG) BY MOUTH AT BEDTIME  . Polyethyl Glycol-Propyl Glycol (SYSTANE) 0.4-0.3 % SOLN Apply 1 drop to eye 2 (two) times daily.  Marland Kitchen spironolactone (ALDACTONE) 50 MG tablet Take 1 tablet (50 mg total) by mouth daily.  . Vitamin D, Cholecalciferol, 50 MCG (  2000 UT) CAPS Take by mouth.   No facility-administered encounter medications on file as of 12/19/2019.   ALLERGIES: Allergies  Allergen Reactions  . Benazepril Other (See Comments)    Patient does not recall being on this medication and/or what reaction she might have had.  . Metoprolol     CRAMPS   VACCINATION STATUS: Immunization History  Administered Date(s) Administered  . Fluad Quad(high Dose 65+) 06/06/2019  . Influenza,inj,Quad PF,6+ Mos 06/12/2014, 08/21/2015, 07/08/2016, 06/23/2017, 04/18/2018  . Pneumococcal Conjugate-13 11/13/2014  . Pneumococcal Polysaccharide-23 02/17/2016  . Tdap 02/01/2011    HPI  79 year old female with medical history as above.  She is being seen in follow-up  for hypercalcemia related to mild hyperparathyroidism.  She is currently on observation only.  She did not require surgical intervention.    -Her previsit labs show calcium increasing to 11.1 mg/dl, while her PTH remains stable at 34 overall improving from 75.  She is currently on vitamin D supplement, 2000 units of vitamin D3 daily.  -She denies any new complaints.   Her prior 24 hour urine calcium has been low at 44,  and was WNL at 118 last check.  She is not currently on calcium supplements.  She denies any family history of parathyroid dysfunction.  She denies any hx of CVA, seizures, nephrolithiasis, nor CKD. she denies Osteoporosis. she is a former smoker.  She did not have bone density since 2015. she denies any new complaints.  Review of Systems  Limited as above.   Objective:    BP (!) 155/78   Pulse 96   Ht 5\' 4"  (1.626 m)   Wt 165 lb (74.8 kg)   BMI 28.32 kg/m   Wt Readings from Last 3 Encounters:  12/19/19 165 lb (74.8 kg)  10/17/19 156 lb 6 oz (70.9 kg)  06/19/19 157 lb (71.2 kg)    Physical Exam  Diabetic Labs (most recent): Lab Results  Component Value Date   HGBA1C 5.6 07/01/2016   HGBA1C 6.1 (H) 10/30/2015   HGBA1C 5.9 (H) 03/19/2015    Lipid Panel     Component Value Date/Time   CHOL 219 (H) 06/06/2019 1422   TRIG 160 (H) 06/06/2019 1422   HDL 50 06/06/2019 1422   CHOLHDL 4.4 06/06/2019 1422   VLDL 19 02/11/2016 0718   LDLCALC 139 (H) 06/06/2019 1422   Recent Results (from the past 2160 hour(s))  PTH, intact and calcium     Status: Abnormal   Collection Time: 12/12/19  9:29 AM  Result Value Ref Range   PTH 41 14 - 64 pg/mL    Comment: . Interpretive Guide    Intact PTH           Calcium ------------------    ----------           ------- Normal Parathyroid    Normal               Normal Hypoparathyroidism    Low or Low Normal    Low Hyperparathyroidism    Primary            Normal or High       High    Secondary          High                 Normal or Low    Tertiary           High  High Non-Parathyroid    Hypercalcemia      Low or Low Normal    High .     Calcium 11.1 (H) 8.6 - 10.4 mg/dL  VITAMIN D 25 Hydroxy (Vit-D Deficiency, Fractures)     Status: None   Collection Time: 12/12/19  9:29 AM  Result Value Ref Range   Vit D, 25-Hydroxy 34 30 - 100 ng/mL    Comment: Vitamin D Status         25-OH Vitamin D: . Deficiency:                    <20 ng/mL Insufficiency:             20 - 29 ng/mL Optimal:                 > or = 30 ng/mL . For 25-OH Vitamin D testing on patients on  D2-supplementation and patients for whom quantitation  of D2 and D3 fractions is required, the [facility]D(TM) 25-OH VIT D, (D2,D3), LC/MS/MS is recommended: order  code [phone] (patients >24yrs). See Note 1 . Note 1 . For additional information, please refer to  http://education.[facility].com/faq/FAQ199  (This link is being provided for informational/ educational purposes only.)     Assessment & Plan:   1. Hypercalcemia Her repeat lab results are reviewed with her, her PTH is stable at 34, however her calcium increasing to 11.1 from 10.5.    This is still consistent with mild primary hyperparathyroidism.    -She will not require intervention at this time, not a surgical candidate.  She will be considered for repeat 24-hour urine calcium patient know with repeat PTH/calcium before her next visit in 3 months. -  I discussed the potential complications of untreated hypercalcemia.  She is benefiting from continued vitamin D supplement, currently 2000 units of vitamin D3 daily. -She was previously documented to have normal bone density.  She is found to have significantly above target.  Her calcium, she will be considered for Sensipar treatment.  - I advised patient to maintain close follow up with Fayrene Helper, MD for primary care needs.     - Time spent on this patient care encounter:  25 minutes of which 50% was spent in  counseling and the rest reviewing  her current and  previous labs / studies and medications  doses and developing a plan for  long term care. Dorna Mai Elizarraraz  participated in the discussions, expressed understanding, and voiced agreement with the above plans.  All questions were answered to her satisfaction. she is encouraged to contact clinic should she have any questions or concerns prior to her return visit.   Follow up plan: Return in about 3 months (around 03/19/2020) for Follow up with Pre-visit Labs, 24 Hours Urine Calcium and Creatinine.  Glade Lloyd, MD Phone: [phone]  Fax: [phone]  This note was partially dictated with voice recognition software. Similar sounding words can be transcribed inadequately or may not  be corrected upon review.  12/19/2019, 2:50 PM

## 2019-12-25 ENCOUNTER — Other Ambulatory Visit: Payer: Self-pay | Admitting: Family Medicine

## 2020-01-11 ENCOUNTER — Other Ambulatory Visit (HOSPITAL_COMMUNITY): Payer: Self-pay | Admitting: Family Medicine

## 2020-01-11 DIAGNOSIS — Z1231 Encounter for screening mammogram for malignant neoplasm of breast: Secondary | ICD-10-CM

## 2020-02-21 ENCOUNTER — Other Ambulatory Visit: Payer: Self-pay

## 2020-02-21 ENCOUNTER — Ambulatory Visit (HOSPITAL_COMMUNITY)
Admission: RE | Admit: 2020-02-21 | Discharge: 2020-02-21 | Disposition: A | Discharge status: Home / Self Care | Payer: PPO | Source: Ambulatory Visit | Attending: Family Medicine | Admitting: Family Medicine

## 2020-02-21 DIAGNOSIS — Z1231 Encounter for screening mammogram for malignant neoplasm of breast: Secondary | ICD-10-CM

## 2020-03-10 DIAGNOSIS — E213 Hyperparathyroidism, unspecified: Secondary | ICD-10-CM | POA: Diagnosis not present

## 2020-03-11 LAB — PTH, INTACT AND CALCIUM
Calcium: 10.2 mg/dL (ref 8.6–10.4)
PTH: 48 pg/mL (ref 14–64)

## 2020-03-11 LAB — CALCIUM, URINE, 24 HOUR: Calcium, 24H Urine: 37 mg/24 h

## 2020-03-11 LAB — CREATININE, URINE, 24 HOUR: Creatinine, 24H Ur: 0.95 g/(24.h) (ref 0.50–2.15)

## 2020-03-19 ENCOUNTER — Other Ambulatory Visit: Payer: Self-pay | Admitting: Family Medicine

## 2020-03-22 ENCOUNTER — Other Ambulatory Visit: Payer: Self-pay | Admitting: Family Medicine

## 2020-03-24 ENCOUNTER — Other Ambulatory Visit: Payer: Self-pay

## 2020-03-24 ENCOUNTER — Ambulatory Visit: Payer: PPO | Admitting: Nurse Practitioner

## 2020-03-24 ENCOUNTER — Encounter: Payer: Self-pay | Admitting: Nurse Practitioner

## 2020-03-24 VITALS — BP 136/82 | HR 105 | Ht 64.0 in | Wt 164.4 lb

## 2020-03-24 DIAGNOSIS — E213 Hyperparathyroidism, unspecified: Secondary | ICD-10-CM | POA: Diagnosis not present

## 2020-03-24 DIAGNOSIS — E559 Vitamin D deficiency, unspecified: Secondary | ICD-10-CM | POA: Diagnosis not present

## 2020-03-24 NOTE — Progress Notes (Signed)
03/24/2020                 Endocrinology follow-up note   Subjective:    Patient ID: Kristen Cox, female    DOB: 01-08-1941, PCP Fayrene Helper, MD   Past Medical History:  Diagnosis Date  . Deep vein thrombosis (DVT) (HCC) 08/2016   Right leg  . Dry eyes 2010   on restasis - Dr. Jorja Loa   . Gout 2009  . Hyperlipidemia   . Hypertension   . Spinal stenosis    Past Surgical History:  Procedure Laterality Date  . CESAREAN SECTION    . COLONOSCOPY  04/09/2009   FFM:BWGY papilla otherwise, normal rectum/Left-sided diverticula/Cecal polyp status post cold snare and removal/The remainder of the colonic mucosa appeared normal. Adenomatous polyps, next TCS 03/2016 per RMR.  . COLONOSCOPY N/A 05/19/2016   Procedure: COLONOSCOPY;  Surgeon: Daneil Dolin, MD;  Location: [facility];  Service: Endoscopy;  Laterality: N/A;  1:00 PM   Social History   Socioeconomic History  . Marital status: Widowed    Spouse name: Not on file  . Number of children: 2  . Years of education: Not on file  . Highest education level: 10th grade  Occupational History  . Occupation: retired   Tobacco Use  . Smoking status: Former Smoker    Packs/day: 0.50    Years: 10.00    Pack years: 5.00    Types: Cigarettes    Quit date: 10/07/1983    Years since quitting: 36.4  . Smokeless tobacco: Never Used  Vaping Use  . Vaping Use: Never used  Substance and Sexual Activity  . Alcohol use: No  . Drug use: No  . Sexual activity: Never  Other Topics Concern  . Not on file  Social History Narrative  . Not on file   Social Determinants of Health   Financial Resource Strain:   . Difficulty of Paying Living Expenses:   Food Insecurity:   . Worried About Charity fundraiser in the Last Year:   . Arboriculturist in the Last Year:   Transportation Needs:   . Film/video editor (Medical):   Marland Kitchen Lack of Transportation (Non-Medical):   Physical Activity:   . Days of Exercise per Week:   .  Minutes of Exercise per Session:   Stress:   . Feeling of Stress :   Social Connections:   . Frequency of Communication with Friends and Family:   . Frequency of Social Gatherings with Friends and Family:   . Attends Religious Services:   . Active Member of Clubs or Organizations:   . Attends Archivist Meetings:   Marland Kitchen Marital Status:    Outpatient Encounter Medications as of 03/24/2020  Medication Sig  . amLODipine (NORVASC) 10 MG tablet TAKE 1 TABLET(10 MG) BY MOUTH DAILY  . aspirin EC 81 MG tablet Take 81 mg by mouth daily.  . Flaxseed, Linseed, (FLAXSEED OIL) 1000 MG CAPS Take 1 capsule by mouth daily.  Marland Kitchen gabapentin (NEURONTIN) 100 MG capsule TAKE 1 CAPSULE BY MOUTH AT BEDTIME FOR BACK PAIN  . meloxicam (MOBIC) 7.5 MG tablet Take 1 tablet (7.5 mg total) by mouth daily.  . montelukast (SINGULAIR) 10 MG tablet TAKE 1 TABLET(10 MG) BY MOUTH AT BEDTIME  . Polyethyl Glycol-Propyl Glycol (SYSTANE) 0.4-0.3 % SOLN Apply 1 drop to eye 2 (two) times daily.  Marland Kitchen spironolactone (ALDACTONE) 50 MG tablet TAKE 1 TABLET(50 MG) BY MOUTH DAILY  .  Vitamin D, Cholecalciferol, 50 MCG (2000 UT) CAPS Take by mouth.   No facility-administered encounter medications on file as of 03/24/2020.   ALLERGIES: Allergies  Allergen Reactions  . Benazepril Other (See Comments)    Patient does not recall being on this medication and/or what reaction she might have had.  . Metoprolol     CRAMPS   VACCINATION STATUS: Immunization History  Administered Date(s) Administered  . Fluad Quad(high Dose 65+) 06/06/2019  . Influenza,inj,Quad PF,6+ Mos 06/12/2014, 08/21/2015, 07/08/2016, 06/23/2017, 04/18/2018  . Pneumococcal Conjugate-13 11/13/2014  . Pneumococcal Polysaccharide-23 02/17/2016  . Tdap 02/01/2011    HPI  79 year old female with medical history as above.  In follow-up for hypercalcemia related to mild hyperparathyroidism.  She is currently being observed only.  She has not required any surgical  intervention nor pharmaceutical intervention at this time.    -Her previsit labs show serum calcium of 10.2, improving from 11.1 before last visit.  Her PTH remains stable at 48 and urine calcium was 37. -She is currently taking vitamin D supplement 2000 units of vitamin D3 daily.  -She denies any new complaints.   She denies any family history of parathyroid dysfunction.  She denies any hx of CVA, seizures, nephrolithiasis, nor CKD. she denies Osteoporosis. she is a former smoker.  She has not had a bone density test since 2015.  she denies any new complaints.  Review of Systems  Constitutional: Negative for appetite change, fatigue and unexpected weight change.  HENT: Negative for trouble swallowing.   Respiratory: Negative for cough, shortness of breath and wheezing.   Cardiovascular: Negative for chest pain and palpitations.  Gastrointestinal: Negative for constipation and diarrhea.  Endocrine: Negative for cold intolerance and heat intolerance.  Neurological: Negative for tremors, weakness, numbness and headaches.  Psychiatric/Behavioral: The patient is not nervous/anxious.   All other systems reviewed and are negative.   Limited as above.   Objective:    BP 136/82 (BP Location: Right Arm, Patient Position: Sitting)   Pulse (!) 105   Ht 5\' 4"  (1.626 m)   Wt 164 lb 6.4 oz (74.6 kg)   BMI 28.22 kg/m   Wt Readings from Last 3 Encounters:  03/24/20 164 lb 6.4 oz (74.6 kg)  12/19/19 165 lb (74.8 kg)  10/17/19 156 lb 6 oz (70.9 kg)    Physical Exam    Physical Exam- Limited  Constitutional:  Body mass index is 28.22 kg/m. , not in acute distress, normal state of mind Eyes:  EOMI, no exophthalmos Neck: Supple Thyroid: No gross goiter Cardiovascular: RRR, no murmers, rubs, or gallops, no edema Respiratory: Adequate breathing efforts, no crackles, rales, rhonchi, or wheezing Musculoskeletal: no gross deformities, strength intact in all four extremities, no gross  restriction of joint movements Skin:  no rashes, no hyperemia Neurological: no tremor with outstretched hands   Diabetic Labs (most recent): Lab Results  Component Value Date   HGBA1C 5.6 07/01/2016   HGBA1C 6.1 (H) 10/30/2015   HGBA1C 5.9 (H) 03/19/2015    Lipid Panel     Component Value Date/Time   CHOL 219 (H) 06/06/2019 1422   TRIG 160 (H) 06/06/2019 1422   HDL 50 06/06/2019 1422   CHOLHDL 4.4 06/06/2019 1422   VLDL 19 02/11/2016 0718   LDLCALC 139 (H) 06/06/2019 1422   Recent Results (from the past 2160 hour(s))  PTH, intact and calcium     Status: None   Collection Time: 03/10/20  7:49 AM  Result Value Ref Range  PTH 48 14 - 64 pg/mL    Comment: . Interpretive Guide    Intact PTH           Calcium ------------------    ----------           ------- Normal Parathyroid    Normal               Normal Hypoparathyroidism    Low or Low Normal    Low Hyperparathyroidism    Primary            Normal or High       High    Secondary          High                 Normal or Low    Tertiary           High                 High Non-Parathyroid    Hypercalcemia      Low or Low Normal    High .    Calcium 10.2 8.6 - 10.4 mg/dL  Creatinine, urine, 24 hour     Status: None   Collection Time: 03/10/20  7:49 AM  Result Value Ref Range   Creatinine, 24H Ur 0.95 0.50 - 2.15 g/24 h  Calcium, urine, 24 hour     Status: None   Collection Time: 03/10/20  7:49 AM  Result Value Ref Range   Calcium, 24H Urine 37 mg/24 h    Comment:                           Reference Range  35-250                            Low calcium diet 35-200    Results for ELENY, CORTEZ (MRN [id number]) as of 03/24/2020 13:12  Ref. Range 11/09/2018 12:27 06/06/2019 14:22 06/12/2019 09:56 12/12/2019 09:29 03/10/2020 07:49  Calcium Latest Ref Range: 8.6 - 10.4 mg/dL 10.7 (H) 11.0 (H) 10.1 11.1 (H) 10.2   Assessment & Plan:   1. Hypercalcemia Her repeat labs have been reviewed with her.  Her PTH is stable at 48.   Her calcium level is decreasing to 10.2.  Her urine calcium from her previsit labs was 37.   This is still consistent with mild primary hyperparathyroidism.    -She will not require any intervention at this time, neither surgical or pharmaceutical.  We will repeat PTH and calcium levels prior to next visit in 6 months. - I discussed the potential complications of untreated hypercalcemia.  She is benefiting from continued vitamin D supplement, currently 2000 units of vitamin D3 daily.  Advised her to take maintenance dose of vitamin D3 5000 PO units daily. -She was previously documented to have normal bone density.  She will be considered for Sensipar treatment if calcium levels become and remain elevated at subsequent visits.  - I advised patient to maintain close follow up with Fayrene Helper, MD for primary care needs.      - Time spent on this patient care encounter:  20 minutes of which 50% was spent in  counseling and the rest reviewing  her current and  previous labs / studies and medications  doses and developing a plan for long term care. Dorna Mai Gear  participated in the discussions, expressed  understanding, and voiced agreement with the above plans.  All questions were answered to her satisfaction. she is encouraged to contact clinic should she have any questions or concerns prior to her return visit.    Follow up plan: Return in about 6 months (around 09/24/2020) for hyperparathyroidism, Previsit labs.  Rayetta Pigg, FNP-BC [facility] Phone: [phone] Fax: [phone]   03/24/2020, 1:25 PM

## 2020-04-09 ENCOUNTER — Ambulatory Visit (INDEPENDENT_AMBULATORY_CARE_PROVIDER_SITE_OTHER): Payer: PPO | Admitting: Family Medicine

## 2020-04-09 ENCOUNTER — Other Ambulatory Visit: Payer: Self-pay

## 2020-04-09 ENCOUNTER — Encounter: Payer: Self-pay | Admitting: Family Medicine

## 2020-04-09 VITALS — BP 150/80 | HR 96 | Resp 16 | Ht 64.0 in | Wt 165.0 lb

## 2020-04-09 DIAGNOSIS — I10 Essential (primary) hypertension: Secondary | ICD-10-CM | POA: Diagnosis not present

## 2020-04-09 DIAGNOSIS — M25561 Pain in right knee: Secondary | ICD-10-CM

## 2020-04-09 DIAGNOSIS — G8929 Other chronic pain: Secondary | ICD-10-CM

## 2020-04-09 DIAGNOSIS — M5416 Radiculopathy, lumbar region: Secondary | ICD-10-CM | POA: Diagnosis not present

## 2020-04-09 DIAGNOSIS — E663 Overweight: Secondary | ICD-10-CM | POA: Diagnosis not present

## 2020-04-09 DIAGNOSIS — E7800 Pure hypercholesterolemia, unspecified: Secondary | ICD-10-CM | POA: Diagnosis not present

## 2020-04-09 MED ORDER — SPIRONOLACTONE 50 MG PO TABS: ORAL_TABLET | ORAL | 1 refills | Status: DC

## 2020-04-09 MED ORDER — MELOXICAM 7.5 MG PO TABS: ORAL_TABLET | ORAL | 1 refills | Status: DC

## 2020-04-09 MED ORDER — SPIRONOLACTONE 50 MG PO TABS: ORAL_TABLET | ORAL | 3 refills | Status: DC

## 2020-04-09 NOTE — Patient Instructions (Signed)
Physical exam end October, call if you need me before, re eval BP at visit and flu vaccine  New higher dose of spironolactone , take one and a half tablets once daily, your blood pressure is still slightly high  Fasting CBC, lipid, cmp and eGFR, TSH in the next 1 week please  Take vit D 3 , 2000 IU two daily  Please reconsider the covid vaccine, you need this to protect you against a severe infection if you are exposed. Continue to wear your face mask and keep socially distant  It is important that you exercise regularly at least 30 minutes 5 times a week. If you develop chest pain, have severe difficulty breathing, or feel very tired, stop exercising immediately and seek medical attention  Please cur back on sodas!  Think about what you will eat, plan ahead. Choose " clean, green, fresh or frozen" over canned, processed or packaged foods which are more sugary, salty and fatty. 70 to 75% of food eaten should be vegetables and fruit. Three meals at set times with snacks allowed between meals, but they must be fruit or vegetables. Aim to eat over a 12 hour period , example 7 am to 7 pm, and STOP after  your last meal of the day. Drink water,generally about 64 ounces per day, no other drink is as healthy. Fruit juice is best enjoyed in a healthy way, by EATING the fruit. Thanks for choosing [facility], we consider it a privelige to serve you.

## 2020-04-10 ENCOUNTER — Encounter: Payer: Self-pay | Admitting: Family Medicine

## 2020-04-10 DIAGNOSIS — E7800 Pure hypercholesterolemia, unspecified: Secondary | ICD-10-CM | POA: Diagnosis not present

## 2020-04-10 DIAGNOSIS — E663 Overweight: Secondary | ICD-10-CM | POA: Insufficient documentation

## 2020-04-10 DIAGNOSIS — I10 Essential (primary) hypertension: Secondary | ICD-10-CM | POA: Diagnosis not present

## 2020-04-10 NOTE — Assessment & Plan Note (Signed)
  Patient re-educated about  the importance of commitment to a  minimum of 150 minutes of exercise per week as able.  The importance of healthy food choices with portion control discussed, as well as eating regularly and within a 12 hour window most days. The need to choose "clean , green" food 50 to 75% of the time is discussed, as well as to make water the primary drink and set a goal of 64 ounces water daily.    Weight /BMI 04/09/2020 03/24/2020 12/19/2019  WEIGHT 165 lb 164 lb 6.4 oz 165 lb  HEIGHT 5\' 4"  5\' 4"  5\' 4"   BMI 28.32 kg/m2 28.22 kg/m2 28.32 kg/m2

## 2020-04-10 NOTE — Assessment & Plan Note (Signed)
Controlled, no change in medication  

## 2020-04-10 NOTE — Progress Notes (Signed)
Kristen Cox     MRN: [id number]      DOB: 1940-10-03   HPI Kristen Cox is here for follow up and re-evaluation of chronic medical conditions, medication management and review of any available recent lab and radiology data.  Preventive health is updated, specifically  Cancer screening and Immunization.   Questions or concerns regarding consultations or procedures which the PT has had in the interim are  addressed. The PT denies any adverse reactions to current medications since the last visit.  There are no new concerns.  Intermittent but not disabling back and knee pain   ROS Denies recent fever or chills. Denies sinus pressure, nasal congestion, ear pain or sore throat. Denies chest congestion, productive cough or wheezing. Denies chest pains, palpitations and leg swelling Denies abdominal pain, nausea, vomiting,diarrhea or constipation.   Denies dysuria, frequency, hesitancy or incontinence.  Denies headaches, seizures, numbness, or tingling. Denies depression, anxiety or insomnia. Denies skin break down or rash.   PE  BP (!) 150/80    Pulse 96    Resp 16    Ht 5\' 4"  (1.626 m)    Wt 165 lb (74.8 kg)    SpO2 96%    BMI 28.32 kg/m   Patient alert and oriented and in no cardiopulmonary distress.  HEENT: No facial asymmetry, EOMI,     Neck supple .  Chest: Clear to auscultation bilaterally.  CVS: S1, S2 no murmurs, no S3.Regular rate.  ABD: Soft non tender.   Ext: No edema  MS: decreased ROM spine, shoulders, hips and knees.  Skin: Intact, no ulcerations or rash noted.  Psych: Good eye contact, normal affect. Memory intact not anxious or depressed appearing.  CNS: CN 2-12 intact, power,  normal throughout.no focal deficits noted.   Assessment & Plan  Essential hypertension Uncontrolled, inc dose of spironolactone DASH diet and commitment to daily physical activity for a minimum of 30 minutes discussed and encouraged, as a part of hypertension management. The  importance of attaining a healthy weight is also discussed.  BP/Weight 04/09/2020 03/24/2020 12/19/2019 10/17/2019 06/19/2019 06/06/2019 4/50/3888  Systolic BP 280 034 917 915 056 979 480  Diastolic BP 80 82 78 78 82 82 104  Wt. (Lbs) 165 164.4 165 156.38 157 157 153  BMI 28.32 28.22 28.32 26.84 26.95 26.95 26.26       Hyperlipemia Hyperlipidemia:Low fat diet discussed and encouraged.   Lipid Panel  Lab Results  Component Value Date   CHOL 219 (H) 06/06/2019   HDL 50 06/06/2019   LDLCALC 139 (H) 06/06/2019   TRIG 160 (H) 06/06/2019   CHOLHDL 4.4 06/06/2019     Updated lab needed at/ before next visit.   Lumbar back pain with radiculopathy affecting right lower extremity Controlled, no change in medication   Hypercalcemia Managed by Endo  Knee pain, right Intermittent flares none currently  Overweight (BMI 25.0-29.9)  Patient re-educated about  the importance of commitment to a  minimum of 150 minutes of exercise per week as able.  The importance of healthy food choices with portion control discussed, as well as eating regularly and within a 12 hour window most days. The need to choose "clean , green" food 50 to 75% of the time is discussed, as well as to make water the primary drink and set a goal of 64 ounces water daily.    Weight /BMI 04/09/2020 03/24/2020 12/19/2019  WEIGHT 165 lb 164 lb 6.4 oz 165 lb  HEIGHT 5\' 4"  5'  4" 5\' 4"   BMI 28.32 kg/m2 28.22 kg/m2 28.32 kg/m2

## 2020-04-10 NOTE — Assessment & Plan Note (Signed)
Managed by Endo 

## 2020-04-10 NOTE — Assessment & Plan Note (Signed)
Uncontrolled, inc dose of spironolactone DASH diet and commitment to daily physical activity for a minimum of 30 minutes discussed and encouraged, as a part of hypertension management. The importance of attaining a healthy weight is also discussed.  BP/Weight 04/09/2020 03/24/2020 12/19/2019 10/17/2019 06/19/2019 06/06/2019 09/10/8675  Systolic BP 373 668 159 470 761 518 343  Diastolic BP 80 82 78 78 82 82 104  Wt. (Lbs) 165 164.4 165 156.38 157 157 153  BMI 28.32 28.22 28.32 26.84 26.95 26.95 26.26

## 2020-04-10 NOTE — Assessment & Plan Note (Signed)
Intermittent flares none currently

## 2020-04-10 NOTE — Assessment & Plan Note (Signed)
Hyperlipidemia:Low fat diet discussed and encouraged.   Lipid Panel  Lab Results  Component Value Date   CHOL 219 (H) 06/06/2019   HDL 50 06/06/2019   LDLCALC 139 (H) 06/06/2019   TRIG 160 (H) 06/06/2019   CHOLHDL 4.4 06/06/2019     Updated lab needed at/ before next visit.

## 2020-04-11 LAB — CMP14+EGFR
ALT: 16 IU/L (ref 0–32)
AST: 19 IU/L (ref 0–40)
Albumin/Globulin Ratio: 1.5 (ref 1.2–2.2)
Albumin: 4.3 g/dL (ref 3.7–4.7)
Alkaline Phosphatase: 72 IU/L (ref 48–121)
BUN/Creatinine Ratio: 19 (ref 12–28)
BUN: 18 mg/dL (ref 8–27)
Bilirubin Total: 0.4 mg/dL (ref 0.0–1.2)
CO2: 23 mmol/L (ref 20–29)
Calcium: 10.5 mg/dL — ABNORMAL HIGH (ref 8.7–10.3)
Chloride: 104 mmol/L (ref 96–106)
Creatinine, Ser: 0.96 mg/dL (ref 0.57–1.00)
GFR calc Af Amer: 66 mL/min/{1.73_m2} (ref >59)
GFR calc non Af Amer: 57 mL/min/{1.73_m2} — ABNORMAL LOW (ref >59)
Globulin, Total: 2.8 g/dL (ref 1.5–4.5)
Glucose: 93 mg/dL (ref 65–99)
Potassium: 4.3 mmol/L (ref 3.5–5.2)
Sodium: 139 mmol/L (ref 134–144)
Total Protein: 7.1 g/dL (ref 6.0–8.5)

## 2020-04-11 LAB — LIPID PANEL
Chol/HDL Ratio: 4.8 ratio — ABNORMAL HIGH (ref 0.0–4.4)
Cholesterol, Total: 215 mg/dL — ABNORMAL HIGH (ref 100–199)
HDL: 45 mg/dL (ref >39)
LDL Chol Calc (NIH): 148 mg/dL — ABNORMAL HIGH (ref 0–99)
Triglycerides: 121 mg/dL (ref 0–149)
VLDL Cholesterol Cal: 22 mg/dL (ref 5–40)

## 2020-04-11 LAB — CBC
Hematocrit: 38.3 % (ref 34.0–46.6)
Hemoglobin: 13.1 g/dL (ref 11.1–15.9)
MCH: 32.2 pg (ref 26.6–33.0)
MCHC: 34.2 g/dL (ref 31.5–35.7)
MCV: 94 fL (ref 79–97)
Platelets: 254 10*3/uL (ref 150–450)
RBC: 4.07 x10E6/uL (ref 3.77–5.28)
RDW: 12.9 % (ref 11.7–15.4)
WBC: 8.4 10*3/uL (ref 3.4–10.8)

## 2020-04-11 LAB — TSH: TSH: 0.895 u[IU]/mL (ref 0.450–4.500)

## 2020-06-16 ENCOUNTER — Encounter: Payer: PPO | Admitting: Family Medicine

## 2020-06-16 DIAGNOSIS — H25013 Cortical age-related cataract, bilateral: Secondary | ICD-10-CM | POA: Diagnosis not present

## 2020-06-16 DIAGNOSIS — H47292 Other optic atrophy, left eye: Secondary | ICD-10-CM | POA: Diagnosis not present

## 2020-06-16 DIAGNOSIS — H52203 Unspecified astigmatism, bilateral: Secondary | ICD-10-CM | POA: Diagnosis not present

## 2020-06-16 DIAGNOSIS — H31012 Macula scars of posterior pole (postinflammatory) (post-traumatic), left eye: Secondary | ICD-10-CM | POA: Diagnosis not present

## 2020-06-16 DIAGNOSIS — H2513 Age-related nuclear cataract, bilateral: Secondary | ICD-10-CM | POA: Diagnosis not present

## 2020-06-16 DIAGNOSIS — H524 Presbyopia: Secondary | ICD-10-CM | POA: Diagnosis not present

## 2020-06-16 DIAGNOSIS — H5213 Myopia, bilateral: Secondary | ICD-10-CM | POA: Diagnosis not present

## 2020-06-19 ENCOUNTER — Other Ambulatory Visit: Payer: Self-pay

## 2020-06-19 ENCOUNTER — Ambulatory Visit (INDEPENDENT_AMBULATORY_CARE_PROVIDER_SITE_OTHER): Payer: PPO

## 2020-06-19 DIAGNOSIS — Z0000 Encounter for general adult medical examination without abnormal findings: Secondary | ICD-10-CM | POA: Diagnosis not present

## 2020-06-19 NOTE — Progress Notes (Signed)
Subjective:   Kristen Cox is a 79 y.o. female who presents for Medicare Annual (Subsequent) preventive examination.         Objective:    There were no vitals filed for this visit. There is no height or weight on file to calculate BMI.  Advanced Directives 12/20/2017 11/08/2017 02/14/2017 05/19/2016  Does Patient Have a Medical Advance Directive? No No No No  Would patient like information on creating a medical advance directive? Yes (MAU/Ambulatory/Procedural Areas - Information given) - Yes (MAU/Ambulatory/Procedural Areas - Information given) No - patient declined information    Current Medications (verified) Outpatient Encounter Medications as of 06/19/2020  Medication Sig   amLODipine (NORVASC) 10 MG tablet TAKE 1 TABLET(10 MG) BY MOUTH DAILY   aspirin EC 81 MG tablet Take 81 mg by mouth daily.   Flaxseed, Linseed, (FLAXSEED OIL) 1000 MG CAPS Take 1 capsule by mouth daily.   gabapentin (NEURONTIN) 100 MG capsule TAKE 1 CAPSULE BY MOUTH AT BEDTIME FOR BACK PAIN   meloxicam (MOBIC) 7.5 MG tablet Take one tablet three times weekly, as needed, for arthritic pain   montelukast (SINGULAIR) 10 MG tablet TAKE 1 TABLET(10 MG) BY MOUTH AT BEDTIME   Polyethyl Glycol-Propyl Glycol (SYSTANE) 0.4-0.3 % SOLN Apply 1 drop to eye 2 (two) times daily.   spironolactone (ALDACTONE) 50 MG tablet Take one and a half  tablets once daily for blood pressure   Vitamin D, Cholecalciferol, 50 MCG (2000 UT) CAPS Take by mouth.   No facility-administered encounter medications on file as of 06/19/2020.    Allergies (verified) Benazepril and Metoprolol   History: Past Medical History:  Diagnosis Date   Deep vein thrombosis (DVT) (HCC) 08/2016   Right leg   Dry eyes 2010   on restasis - Dr. Jorja Loa    Gout 2009   Hyperlipidemia    Hypertension    Spinal stenosis    Past Surgical History:  Procedure Laterality Date   CESAREAN SECTION     COLONOSCOPY  04/09/2009   CNO:BSJG  papilla otherwise, normal rectum/Left-sided diverticula/Cecal polyp status post cold snare and removal/The remainder of the colonic mucosa appeared normal. Adenomatous polyps, next TCS 03/2016 per RMR.   COLONOSCOPY N/A 05/19/2016   Procedure: COLONOSCOPY;  Surgeon: Daneil Dolin, MD;  Location: [facility];  Service: Endoscopy;  Laterality: N/A;  1:00 PM   Family History  Problem Relation Age of Onset   Stroke Mother    Stroke Father    Diabetes Sister    Pulmonary embolism Sister    Cirrhosis Brother    Colon cancer Neg Hx    Social History   Socioeconomic History   Marital status: Widowed    Spouse name: Not on file   Number of children: 2   Years of education: Not on file   Highest education level: 10th grade  Occupational History   Occupation: retired   Tobacco Use   Smoking status: Former Smoker    Packs/day: 0.50    Years: 10.00    Pack years: 5.00    Types: Cigarettes    Quit date: 10/07/1983    Years since quitting: 36.7   Smokeless tobacco: Never Used  Vaping Use   Vaping Use: Never used  Substance and Sexual Activity   Alcohol use: No   Drug use: No   Sexual activity: Never  Other Topics Concern   Not on file  Social History Narrative   Not on file   Social Determinants of  Health   Financial Resource Strain:    Difficulty of Paying Living Expenses: Not on file  Food Insecurity:    Worried About [place] in the Last Year: Not on file   Ran Out of Food in the Last Year: Not on file  Transportation Needs:    Lack of Transportation (Medical): Not on file   Lack of Transportation (Non-Medical): Not on file  Physical Activity:    Days of Exercise per Week: Not on file   Minutes of Exercise per Session: Not on file  Stress:    Feeling of Stress : Not on file  Social Connections:    Frequency of Communication with Friends and Family: Not on file   Frequency of Social Gatherings with Friends and Family: Not on file    Attends Religious Services: Not on file   Active Member of Clubs or Organizations: Not on file   Attends Archivist Meetings: Not on file   Marital Status: Not on file    Tobacco Counseling Counseling given: Not Answered                  Diabetic? No          Activities of Daily Living No flowsheet data found.  Patient Care Team: Fayrene Helper, MD as PCP - General Rourk, Cristopher Estimable, MD as Consulting Physician (Gastroenterology) Cassandria Anger, MD as Consulting Physician (Endocrinology) Florian Buff, MD as Consulting Physician (Obstetrics and Gynecology) Sydnee Cabal, MD as Consulting Physician (Orthopedic Surgery)  Indicate any recent Medical Services you may have received from other than [facility] providers in the past year (date may be approximate).     Assessment:   This is a routine wellness examination for [facility].  Hearing/Vision screen No exam data present  Dietary issues and exercise activities discussed:    Goals     Exercise 3x per week (30 min per time)     Recommend starting a routine exercise program at least 3 days a week for 30-45 minutes at a time as tolerated.        Depression Screen PHQ 2/9 Scores 04/09/2020 06/19/2019 06/06/2019 11/09/2018 09/06/2018 04/18/2018 12/20/2017  PHQ - 2 Score 0 0 0 0 4 0 1  PHQ- 9 Score 0 - - - 13 2 -    Fall Risk Fall Risk  04/09/2020 06/19/2019 01/16/2019 11/09/2018 09/06/2018  Falls in the past year? 0 0 0 0 0  Number falls in past yr: 0 0 0 0 -  Injury with Fall? 0 0 0 0 -  Risk for fall due to : - - - - -  Risk for fall due to: Comment - - - - -    Any stairs in or around the home? Yes  If so, are there any without handrails? Yes  Home free of loose throw rugs in walkways, pet beds, electrical cords, etc? Yes  Adequate lighting in your home to reduce risk of falls? Yes   ASSISTIVE DEVICES UTILIZED TO PREVENT FALLS:  Life alert? No  Use of a cane, walker or w/c? No    Grab bars in the bathroom? No  Shower chair or bench in shower? No  Elevated toilet seat or a handicapped toilet? No   TIMED UP AND GO:  Was the test performed? No .      6CIT Screen 06/19/2019 12/20/2017 02/14/2017  What Year? 0 points 0 points 0 points  What month? 0 points 0 points 0  points  What time? 0 points 0 points 0 points  Count back from 20 0 points 0 points 0 points  Months in reverse 0 points 0 points 0 points  Repeat phrase 2 points 0 points 0 points  Total Score 2 0 0    Immunizations Immunization History  Administered Date(s) Administered   Fluad Quad(high Dose 65+) 06/06/2019   Influenza,inj,Quad PF,6+ Mos 06/12/2014, 08/21/2015, 07/08/2016, 06/23/2017, 04/18/2018   Pneumococcal Conjugate-13 11/13/2014   Pneumococcal Polysaccharide-23 02/17/2016   Tdap 02/01/2011      TDAP status: Up to date   Flu Vaccine status: Declined, Education has been provided regarding the importance of this vaccine but patient still declined. Advised may receive this vaccine at local pharmacy or Health Dept. Aware to provide a copy of the vaccination record if obtained from local pharmacy or Health Dept. Verbalized acceptance and understanding.   Pneumococcal vaccine status: Up to date   Covid-19 vaccine status: Declined, Education has been provided regarding the importance of this vaccine but patient still declined. Advised may receive this vaccine at local pharmacy or Health Dept.or vaccine clinic. Aware to provide a copy of the vaccination record if obtained from local pharmacy or Health Dept. Verbalized acceptance and understanding.  Qualifies for Shingles Vaccine? Yes   Zostavax completed No   Shingrix Completed?: No.    Education has been provided regarding the importance of this vaccine. Patient has been advised to call insurance company to determine out of pocket expense if they have not yet received this vaccine. Advised may also receive vaccine at local pharmacy or  Health Dept. Verbalized acceptance and understanding.  Screening Tests Health Maintenance  Topic Date Due   Hepatitis C Screening  Never done   COVID-19 Vaccine (1) Never done   INFLUENZA VACCINE  03/23/2020   TETANUS/TDAP  01/31/2021   DEXA SCAN  Completed   PNA vac Low Risk Adult  Completed    Health Maintenance  Health Maintenance Due  Topic Date Due   Hepatitis C Screening  Never done   COVID-19 Vaccine (1) Never done   INFLUENZA VACCINE  03/23/2020    Colorectal cancer screening: No longer required.  Mammogram status: No longer required.  Bone Density: Complete   Lung Cancer Screening: (Low Dose CT Chest recommended if Age 28-80 years, 30 pack-year currently smoking OR have quit w/in 15years.) does not qualify.    Additional Screening:  Hepatitis C Screening: does qualify; has not completed  Vision Screening: Recommended annual ophthalmology exams for early detection of glaucoma and other disorders of the eye. Is the patient up to date with their annual eye exam?  Yes    Who is the provider or what is the name of the office in which the patient attends annual eye exams? Dr. Gershon Crane [facility], [place]   If pt is not established with a provider, would they like to be referred to a provider to establish care? No .   Dental Screening: Recommended annual dental exams for proper oral hygiene  Community Resource Referral / Chronic Care Management: CRR required this visit?  No   CCM required this visit?  No      Plan:     I have personally reviewed and noted the following in the patients chart:    Medical and social history  Use of alcohol, tobacco or illicit drugs   Current medications and supplements  Functional ability and status  Nutritional status  Physical activity  Advanced directives  List of other physicians  Hospitalizations, surgeries, and ER visits in previous 12 months  Vitals  Screenings to include cognitive, depression,  and falls  Referrals and appointments  In addition, I have reviewed and discussed with patient certain preventive protocols, quality metrics, and best practice recommendations. A written personalized care plan for preventive services as well as general preventive health recommendations were provided to patient.     Lonn Georgia, LPN   [id number]   Nurse Notes: AWV conducted over the phone with pt consent for televisit via audio. Call took approx. 20 minutes. Pt was pleasant and advised on health maintenance care gaps that are available to maximize health and well-being.

## 2020-06-19 NOTE — Patient Instructions (Addendum)
Ms. Kristen Cox , Thank you for taking time to come for your Medicare Wellness Visit. I appreciate your ongoing commitment to your health goals. Please review the following plan we discussed and let me know if I can assist you in the future.   Screening recommendations/referrals: Colonoscopy: complete, no longer required  Mammogram: complete, can repeat annually  Bone Density: complete   Recommended yearly ophthalmology/optometry visit for glaucoma screening and checkup Recommended yearly dental visit for hygiene and checkup  Vaccinations: Influenza vaccine: DUE, education provided. Pt states she will be vaccinated at next visit.  Pneumococcal vaccine: complete  Tdap vaccine: 01/31/21  Shingles vaccine: Eligible, pt states she declines at this time due to price.     Advanced directives: N/A   Conditions/risks identified: None   Next appointment: 07/08/20 @ 1:00pm with Dr. Moshe Cipro    Preventive Care 65 Years and Older, Female Preventive care refers to lifestyle choices and visits with your health care provider that can promote health and wellness. What does preventive care include?  A yearly physical exam. This is also called an annual well check.  Dental exams once or twice a year.  Routine eye exams. Ask your health care provider how often you should have your eyes checked.  Personal lifestyle choices, including:  Daily care of your teeth and gums.  Regular physical activity.  Eating a healthy diet.  Avoiding tobacco and drug use.  Limiting alcohol use.  Practicing safe sex.  Taking low-dose aspirin every day.  Taking vitamin and mineral supplements as recommended by your health care provider. What happens during an annual well check? The services and screenings done by your health care provider during your annual well check will depend on your age, overall health, lifestyle risk factors, and family history of disease. Counseling  Your health care provider may ask you  questions about your:  Alcohol use.  Tobacco use.  Drug use.  Emotional well-being.  Home and relationship well-being.  Sexual activity.  Eating habits.  History of falls.  Memory and ability to understand (cognition).  Work and work Statistician.  Reproductive health. Screening  You may have the following tests or measurements:  Height, weight, and BMI.  Blood pressure.  Lipid and cholesterol levels. These may be checked every 5 years, or more frequently if you are over 53 years old.  Skin check.  Lung cancer screening. You may have this screening every year starting at age 8 if you have a 30-pack-year history of smoking and currently smoke or have quit within the past 15 years.  Fecal occult blood test (FOBT) of the stool. You may have this test every year starting at age 17.  Flexible sigmoidoscopy or colonoscopy. You may have a sigmoidoscopy every 5 years or a colonoscopy every 10 years starting at age 2.  Hepatitis C blood test.  Hepatitis B blood test.  Sexually transmitted disease (STD) testing.  Diabetes screening. This is done by checking your blood sugar (glucose) after you have not eaten for a while (fasting). You may have this done every 1-3 years.  Bone density scan. This is done to screen for osteoporosis. You may have this done starting at age 58.  Mammogram. This may be done every 1-2 years. Talk to your health care provider about how often you should have regular mammograms. Talk with your health care provider about your test results, treatment options, and if necessary, the need for more tests. Vaccines  Your health care provider may recommend certain vaccines, such as:  Influenza vaccine. This is recommended every year.  Tetanus, diphtheria, and acellular pertussis (Tdap, Td) vaccine. You may need a Td booster every 10 years.  Zoster vaccine. You may need this after age 19.  Pneumococcal 13-valent conjugate (PCV13) vaccine. One dose is  recommended after age 32.  Pneumococcal polysaccharide (PPSV23) vaccine. One dose is recommended after age 51. Talk to your health care provider about which screenings and vaccines you need and how often you need them. This information is not intended to replace advice given to you by your health care provider. Make sure you discuss any questions you have with your health care provider. Document Released: 09/05/2015 Document Revised: 04/28/2016 Document Reviewed: 06/10/2015 Elsevier Interactive Patient Education  2017 [place] Prevention in the Home Falls can cause injuries. They can happen to people of all ages. There are many things you can do to make your home safe and to help prevent falls. What can I do on the outside of my home?  Regularly fix the edges of walkways and driveways and fix any cracks.  Remove anything that might make you trip as you walk through a door, such as a raised step or threshold.  Trim any bushes or trees on the path to your home.  Use bright outdoor lighting.  Clear any walking paths of anything that might make someone trip, such as rocks or tools.  Regularly check to see if handrails are loose or broken. Make sure that both sides of any steps have handrails.  Any raised decks and porches should have guardrails on the edges.  Have any leaves, snow, or ice cleared regularly.  Use sand or salt on walking paths during winter.  Clean up any spills in your garage right away. This includes oil or grease spills. What can I do in the bathroom?  Use night lights.  Install grab bars by the toilet and in the tub and shower. Do not use towel bars as grab bars.  Use non-skid mats or decals in the tub or shower.  If you need to sit down in the shower, use a plastic, non-slip stool.  Keep the floor dry. Clean up any water that spills on the floor as soon as it happens.  Remove soap buildup in the tub or shower regularly.  Attach bath mats  securely with double-sided non-slip rug tape.  Do not have throw rugs and other things on the floor that can make you trip. What can I do in the bedroom?  Use night lights.  Make sure that you have a light by your bed that is easy to reach.  Do not use any sheets or blankets that are too big for your bed. They should not hang down onto the floor.  Have a firm chair that has side arms. You can use this for support while you get dressed.  Do not have throw rugs and other things on the floor that can make you trip. What can I do in the kitchen?  Clean up any spills right away.  Avoid walking on wet floors.  Keep items that you use a lot in easy-to-reach places.  If you need to reach something above you, use a strong step stool that has a grab bar.  Keep electrical cords out of the way.  Do not use floor polish or wax that makes floors slippery. If you must use wax, use non-skid floor wax.  Do not have throw rugs and other things on the floor that  can make you trip. What can I do with my stairs?  Do not leave any items on the stairs.  Make sure that there are handrails on both sides of the stairs and use them. Fix handrails that are broken or loose. Make sure that handrails are as long as the stairways.  Check any carpeting to make sure that it is firmly attached to the stairs. Fix any carpet that is loose or worn.  Avoid having throw rugs at the top or bottom of the stairs. If you do have throw rugs, attach them to the floor with carpet tape.  Make sure that you have a light switch at the top of the stairs and the bottom of the stairs. If you do not have them, ask someone to add them for you. What else can I do to help prevent falls?  Wear shoes that:  Do not have high heels.  Have rubber bottoms.  Are comfortable and fit you well.  Are closed at the toe. Do not wear sandals.  If you use a stepladder:  Make sure that it is fully opened. Do not climb a closed  stepladder.  Make sure that both sides of the stepladder are locked into place.  Ask someone to hold it for you, if possible.  Clearly mark and make sure that you can see:  Any grab bars or handrails.  First and last steps.  Where the edge of each step is.  Use tools that help you move around (mobility aids) if they are needed. These include:  Canes.  Walkers.  Scooters.  Crutches.  Turn on the lights when you go into a dark area. Replace any light bulbs as soon as they burn out.  Set up your furniture so you have a clear path. Avoid moving your furniture around.  If any of your floors are uneven, fix them.  If there are any pets around you, be aware of where they are.  Review your medicines with your doctor. Some medicines can make you feel dizzy. This can increase your chance of falling. Ask your doctor what other things that you can do to help prevent falls. This information is not intended to replace advice given to you by your health care provider. Make sure you discuss any questions you have with your health care provider. Document Released: 06/05/2009 Document Revised: 01/15/2016 Document Reviewed: 09/13/2014 Elsevier Interactive Patient Education  2017 [employer].

## 2020-07-08 ENCOUNTER — Encounter: Payer: Self-pay | Admitting: Family Medicine

## 2020-07-08 ENCOUNTER — Other Ambulatory Visit: Payer: Self-pay

## 2020-07-08 ENCOUNTER — Ambulatory Visit (INDEPENDENT_AMBULATORY_CARE_PROVIDER_SITE_OTHER): Payer: PPO | Admitting: Family Medicine

## 2020-07-08 VITALS — BP 158/93 | HR 103 | Resp 16 | Ht 64.5 in | Wt 162.0 lb

## 2020-07-08 DIAGNOSIS — Z0001 Encounter for general adult medical examination with abnormal findings: Secondary | ICD-10-CM

## 2020-07-08 DIAGNOSIS — E7800 Pure hypercholesterolemia, unspecified: Secondary | ICD-10-CM

## 2020-07-08 DIAGNOSIS — I10 Essential (primary) hypertension: Secondary | ICD-10-CM

## 2020-07-08 DIAGNOSIS — Z23 Encounter for immunization: Secondary | ICD-10-CM

## 2020-07-08 DIAGNOSIS — R002 Palpitations: Secondary | ICD-10-CM

## 2020-07-08 MED ORDER — SPIRONOLACTONE 50 MG PO TABS: ORAL_TABLET | ORAL | 3 refills | Status: DC

## 2020-07-08 NOTE — Patient Instructions (Addendum)
F/U in office with mD first week in January , re evaluate blood pressure and cholsterol, call if you need me sooner  EKG in office today  New higher dose of spironolactone 50 mg take tWO by mouth every morning, blood pressure still high  Fasting lipid, cmp and EGFR, 5 days before January appointment  Start ES tyleonol one to two daily for arthritis pain  Commit to moving joints regularly please   Preventing High Cholesterol Cholesterol is a white, waxy substance similar to fat that the human body needs to help build cells. The liver makes all the cholesterol that a person's body needs. Having high cholesterol (hypercholesterolemia) increases a person's risk for heart disease and stroke. Extra (excess) cholesterol comes from the food the person eats. High cholesterol can often be prevented with diet and lifestyle changes. If you already have high cholesterol, you can control it with diet and lifestyle changes and with medicine. How can high cholesterol affect me? If you have high cholesterol, deposits (plaques) may build up on the walls of your arteries. The arteries are the blood vessels that carry blood away from your heart. Plaques make the arteries narrower and stiffer. This can limit or block blood flow and cause blood clots to form. Blood clots:  Are tiny balls of cells that form in your blood.  Can move to the heart or brain, causing a heart attack or stroke. Plaques in arteries greatly increase your risk for heart attack and stroke.Making diet and lifestyle changes can reduce your risk for these conditions that may threaten your life. What can increase my risk? This condition is more likely to develop in people who:  Eat foods that are high in saturated fat or cholesterol. Saturated fat is mostly found in: ? Foods that contain animal fat, such as red meat and some dairy products. ? Certain fatty foods made from plants, such as tropical oils.  Are overweight.  Are not getting  enough exercise.  Have a family history of high cholesterol. What actions can I take to prevent this? Nutrition   Eat less saturated fat.  Avoid trans fats (partially hydrogenated oils). These are often found in margarine and in some baked goods, fried foods, and snacks bought in packages.  Avoid precooked or cured meat, such as sausages or meat loaves.  Avoid foods and drinks that have added sugars.  Eat more fruits, vegetables, and whole grains.  Choose healthy sources of protein, such as fish, poultry, lean cuts of red meat, beans, peas, lentils, and nuts.  Choose healthy sources of fat, such as: ? Nuts. ? Vegetable oils, especially olive oil. ? Fish that have healthy fats (omega-3 fatty acids), such as mackerel or salmon. The items listed above may not be a complete list of recommended foods and beverages. Contact a dietitian for more information. Lifestyle  Lose weight if you are overweight. Losing 5-10 lb (2.3-4.5 kg) can help prevent or control high cholesterol. It can also lower your risk for diabetes and high blood pressure. Ask your health care provider to help you with a diet and exercise plan to lose weight safely.  Do not use any products that contain nicotine or tobacco, such as cigarettes, e-cigarettes, and chewing tobacco. If you need help quitting, ask your health care provider.  Limit your alcohol intake. ? Do not drink alcohol if:  Your health care provider tells you not to drink.  You are pregnant, may be pregnant, or are planning to become pregnant. ? If you   drink alcohol:  Limit how much you use to:  0-1 drink a day for women.  0-2 drinks a day for men.  Be aware of how much alcohol is in your drink. In the [place] one drink equals one 12 oz bottle of beer (355 mL), one 5 oz glass of wine (148 mL), or one 1 oz glass of hard liquor (44 mL). Activity   Get enough exercise. Each week, do at least 150 minutes of exercise that takes a medium level of  effort (moderate-intensity exercise). ? This is exercise that:  Makes your heart beat faster and makes you breathe harder than usual.  Allows you to still be able to talk. ? You could exercise in short sessions several times a day or longer sessions a few times a week. For example, on 5 days each week, you could walk fast or ride your bike 3 times a day for 10 minutes each time.  Do exercises as told by your health care provider. Medicines  In addition to diet and lifestyle changes, your health care provider may recommend medicines to help lower cholesterol. This may be a medicine to lower the amount of cholesterol your liver makes. You may need medicine if: ? Diet and lifestyle changes do not lower your cholesterol enough. ? You have high cholesterol and other risk factors for heart disease or stroke.  Take over-the-counter and prescription medicines only as told by your health care provider. General information  Manage your risk factors for high cholesterol. Talk with your health care provider about all your risk factors and how to lower your risk.  Manage other conditions that you have, such as diabetes or high blood pressure (hypertension).  Have blood tests to check your cholesterol levels at regular points in time as told by your health care provider.  Keep all follow-up visits as told by your health care provider. This is important. Where to find more information  American Heart Association: www.heart.org  National Heart, Lung, and Blood Institute: www.nhlbi.nih.gov Summary  High cholesterol increases your risk for heart disease and stroke. By keeping your cholesterol level low, you can reduce your risk for these conditions.  High cholesterol can often be prevented with diet and lifestyle changes.  Work with your health care provider to manage your risk factors, and have your blood tested regularly. This information is not intended to replace advice given to you by your  health care provider. Make sure you discuss any questions you have with your health care provider. Document Revised: 12/01/2018 Document Reviewed: 04/17/2016 Elsevier Patient Education  2020 Elsevier Inc.  

## 2020-07-10 DIAGNOSIS — Z0001 Encounter for general adult medical examination with abnormal findings: Secondary | ICD-10-CM | POA: Insufficient documentation

## 2020-07-10 DIAGNOSIS — R002 Palpitations: Secondary | ICD-10-CM | POA: Insufficient documentation

## 2020-07-10 NOTE — Progress Notes (Signed)
    Kristen Cox     MRN: [id number]      DOB: Mar 27, 1941  HPI: Patient is in for annual physical exam. Uncontrolled hypertension is addressed also at  the visit as well as c/o intermittent fluttering/ palpitations for past several months Recent labs,  are reviewed. Immunization is reviewed , and  updated if needed.   PE: BP (!) 158/93   Pulse (!) 103   Resp 16   Ht 5' 4.5" (1.638 m)   Wt 162 lb (73.5 kg)   SpO2 98%   BMI 27.38 kg/m   Pleasant  female, alert and oriented x 3, in no cardio-pulmonary distress. Afebrile. HEENT No facial trauma or asymetry. Sinuses non tender.  Extra occullar muscles intact.. External ears normal, . Neck: decreased ROM, no adenopathy,JVD or thyromegaly.No bruits.  Chest: Clear to ascultation bilaterally.No crackles or wheezes. Non tender to palpation  Breast: Normal mammogram in 02/2020  Cardiovascular system; Heart sounds normal,  S1 and  S2 ,no S3.  No murmur, or thrill. Apical beat not displaced Peripheral pulses normal. EKG: sinus tachycardia, no LVH , no ischemic chnages   Abdomen: Soft, non tender, no organomegaly or masses. No bruits. Bowel sounds normal. No guarding, tenderness or rebound.      Musculoskeletal exam: Decreased  ROM of spine, hips , shoulders and knees.  deformity ,swelling or crepitus noted. No muscle wasting or atrophy.   Neurologic: Cranial nerves 2 to 12 intact. Power, tone ,sensation and reflexes normal throughout.  disturbance in gait. No tremor.  Skin: Intact, no ulceration, erythema , scaling or rash noted. Pigmentation normal throughout  Psych; Normal mood and affect. Judgement and concentration normal   Assessment & Plan:  Encounter for annual general medical examination with abnormal findings in adult Annual exam as documented. Counseling done  re healthy lifestyle involving commitment to 150 minutes exercise per week, heart healthy diet, and attaining healthy weight.The  importance of adequate sleep also discussed. Regular seat belt use and home safety, is also discussed. Changes in health habits are decided on by the patient with goals and time frames  set for achieving them. Immunization and cancer screening needs are specifically addressed at this visit.   Essential hypertension Uncontrolled,dose change and re eval in 8 tto 10 weeks DASH diet and commitment to daily physical activity for a minimum of 30 minutes discussed and encouraged, as a part of hypertension management. The importance of attaining a healthy weight is also discussed. eKG ; sinus tach with occasional PVC, no significant ischemia or lVH, refer to cardiology  BP/Weight 07/08/2020 04/09/2020 03/24/2020 12/19/2019 10/17/2019 06/19/2019 49/82/6415  Systolic BP 830 940 768 088 110 315 945  Diastolic BP 93 80 82 78 78 82 82  Wt. (Lbs) 162 165 164.4 165 156.38 157 157  BMI 27.38 28.32 28.22 28.32 26.84 26.95 26.95       Palpitations 3 to 4 month h/o intermittent palpitations, no assocuiatedd light headedness, duration less that 3 minutes EKG; sinus tachycardiawith occasional PVC, long h/o hTN refer to cardiology

## 2020-07-10 NOTE — Assessment & Plan Note (Signed)
3 to 4 month h/o intermittent palpitations, no assocuiatedd light headedness, duration less that 3 minutes EKG; sinus tachycardiawith occasional PVC, long h/o hTN refer to cardiology

## 2020-07-10 NOTE — Assessment & Plan Note (Addendum)
Uncontrolled,dose change and re eval in 8 tto 10 weeks DASH diet and commitment to daily physical activity for a minimum of 30 minutes discussed and encouraged, as a part of hypertension management. The importance of attaining a healthy weight is also discussed. eKG ; sinus tach with occasional PVC, no significant ischemia or lVH, refer to cardiology  BP/Weight 07/08/2020 04/09/2020 03/24/2020 12/19/2019 10/17/2019 06/19/2019 86/38/1771  Systolic BP 165 790 383 338 329 191 660  Diastolic BP 93 80 82 78 78 82 82  Wt. (Lbs) 162 165 164.4 165 156.38 157 157  BMI 27.38 28.32 28.22 28.32 26.84 26.95 26.95

## 2020-07-10 NOTE — Assessment & Plan Note (Signed)

## 2020-07-28 ENCOUNTER — Ambulatory Visit (INDEPENDENT_AMBULATORY_CARE_PROVIDER_SITE_OTHER): Payer: PPO

## 2020-07-28 ENCOUNTER — Other Ambulatory Visit: Payer: Self-pay

## 2020-07-28 DIAGNOSIS — Z23 Encounter for immunization: Secondary | ICD-10-CM

## 2020-08-08 ENCOUNTER — Other Ambulatory Visit: Payer: Self-pay | Admitting: Family Medicine

## 2020-08-27 DIAGNOSIS — E7800 Pure hypercholesterolemia, unspecified: Secondary | ICD-10-CM | POA: Diagnosis not present

## 2020-08-27 DIAGNOSIS — I10 Essential (primary) hypertension: Secondary | ICD-10-CM | POA: Diagnosis not present

## 2020-08-28 LAB — CMP14+EGFR
ALT: 13 IU/L (ref 0–32)
AST: 33 IU/L (ref 0–40)
Albumin/Globulin Ratio: 1.5 (ref 1.2–2.2)
Albumin: 4.3 g/dL (ref 3.7–4.7)
Alkaline Phosphatase: 59 IU/L (ref 44–121)
BUN/Creatinine Ratio: 16 (ref 12–28)
BUN: 17 mg/dL (ref 8–27)
Bilirubin Total: 0.5 mg/dL (ref 0.0–1.2)
CO2: 21 mmol/L (ref 20–29)
Calcium: 10.6 mg/dL — ABNORMAL HIGH (ref 8.7–10.3)
Chloride: 102 mmol/L (ref 96–106)
Creatinine, Ser: 1.04 mg/dL — ABNORMAL HIGH (ref 0.57–1.00)
GFR calc Af Amer: 59 mL/min/{1.73_m2} — ABNORMAL LOW (ref >59)
GFR calc non Af Amer: 51 mL/min/{1.73_m2} — ABNORMAL LOW (ref >59)
Globulin, Total: 2.8 g/dL (ref 1.5–4.5)
Glucose: 108 mg/dL — ABNORMAL HIGH (ref 65–99)
Potassium: 5.1 mmol/L (ref 3.5–5.2)
Sodium: 136 mmol/L (ref 134–144)
Total Protein: 7.1 g/dL (ref 6.0–8.5)

## 2020-08-28 LAB — LIPID PANEL
Chol/HDL Ratio: 5 ratio — ABNORMAL HIGH (ref 0.0–4.4)
Cholesterol, Total: 232 mg/dL — ABNORMAL HIGH (ref 100–199)
HDL: 46 mg/dL (ref >39)
LDL Chol Calc (NIH): 164 mg/dL — ABNORMAL HIGH (ref 0–99)
Triglycerides: 124 mg/dL (ref 0–149)
VLDL Cholesterol Cal: 22 mg/dL (ref 5–40)

## 2020-09-01 MED ORDER — ATORVASTATIN CALCIUM 10 MG PO TABS
10.0000 mg | ORAL_TABLET | Freq: Every day | ORAL | 1 refills | Status: DC
Start: 2020-09-01 — End: 2020-09-03

## 2020-09-01 NOTE — Addendum Note (Signed)
Addended by: Eual Fines on: 09/01/2020 03:53 PM   Modules accepted: Orders

## 2020-09-02 NOTE — Progress Notes (Signed)
Pt notified to keep appt.

## 2020-09-03 ENCOUNTER — Ambulatory Visit (INDEPENDENT_AMBULATORY_CARE_PROVIDER_SITE_OTHER): Payer: PPO | Admitting: Nurse Practitioner

## 2020-09-03 ENCOUNTER — Encounter: Payer: Self-pay | Admitting: Nurse Practitioner

## 2020-09-03 ENCOUNTER — Other Ambulatory Visit: Payer: Self-pay

## 2020-09-03 DIAGNOSIS — I10 Essential (primary) hypertension: Secondary | ICD-10-CM

## 2020-09-03 DIAGNOSIS — R7301 Impaired fasting glucose: Secondary | ICD-10-CM

## 2020-09-03 DIAGNOSIS — R000 Tachycardia, unspecified: Secondary | ICD-10-CM | POA: Insufficient documentation

## 2020-09-03 DIAGNOSIS — E7800 Pure hypercholesterolemia, unspecified: Secondary | ICD-10-CM | POA: Diagnosis not present

## 2020-09-03 MED ORDER — SPIRONOLACTONE 100 MG PO TABS: 100.0000 mg | ORAL_TABLET | Freq: Every day | ORAL | 1 refills | Status: DC

## 2020-09-03 MED ORDER — ATORVASTATIN CALCIUM 20 MG PO TABS: 20.0000 mg | ORAL_TABLET | Freq: Every day | ORAL | 1 refills | Status: DC

## 2020-09-03 NOTE — Progress Notes (Signed)
Established Patient Office Visit  Subjective:  Patient ID: Kristen Cox, female    DOB: [birth year]  Age: 80 y.o. MRN: [id number]  CC:  Chief Complaint  Patient presents with  . Hypertension  . Back Pain    HPI Kristen Cox presents for BP and cholesterol check.  She was seen with Dr. Moshe Cipro on 07/08/20. Her LDL is 164 which is up from her previous of 148 on 04/10/20.  At that time her BP was elevated, and her spironolactone was increased to 100 mg qAM.  She states she has only been taking 1.5 tablets in the AM.  Currently, she is taking atorvastatin 10 mg daily.  Past Medical History:  Diagnosis Date  . Deep vein thrombosis (DVT) ([facility]) 08/2016   Right leg  . Dry eyes 2010   on restasis - Dr. Jorja Loa   . Gout 2009  . Hyperlipidemia   . Hypertension   . Spinal stenosis     Past Surgical History:  Procedure Laterality Date  . CESAREAN SECTION    . COLONOSCOPY  04/09/2009   IDP:OEUM papilla otherwise, normal rectum/Left-sided diverticula/Cecal polyp status post cold snare and removal/The remainder of the colonic mucosa appeared normal. Adenomatous polyps, next TCS 03/2016 per RMR.  . COLONOSCOPY N/A 05/19/2016   Procedure: COLONOSCOPY;  Surgeon: Daneil Dolin, MD;  Location: [facility];  Service: Endoscopy;  Laterality: N/A;  1:00 PM    Family History  Problem Relation Age of Onset  . Stroke Mother   . Stroke Father   . Diabetes Sister   . Pulmonary embolism Sister   . Cirrhosis Brother   . Colon cancer Neg Hx     Social History   Socioeconomic History  . Marital status: Widowed    Spouse name: Not on file  . Number of children: 2  . Years of education: Not on file  . Highest education level: 10th grade  Occupational History  . Occupation: retired   Tobacco Use  . Smoking status: Former Smoker    Packs/day: 0.50    Years: 10.00    Pack years: 5.00    Types: Cigarettes    Quit date: 10/07/1983    Years since quitting: 36.9  . Smokeless tobacco:  Never Used  Vaping Use  . Vaping Use: Never used  Substance and Sexual Activity  . Alcohol use: No  . Drug use: No  . Sexual activity: Never  Other Topics Concern  . Not on file  Social History Narrative  . Not on file   Social Determinants of Health   Financial Resource Strain: Low Risk   . Difficulty of Paying Living Expenses: Not very hard  Food Insecurity: No Food Insecurity  . Worried About Charity fundraiser in the Last Year: Never true  . Ran Out of Food in the Last Year: Never true  Transportation Needs: No Transportation Needs  . Lack of Transportation (Medical): No  . Lack of Transportation (Non-Medical): No  Physical Activity: Insufficiently Active  . Days of Exercise per Week: 2 days  . Minutes of Exercise per Session: 20 min  Stress: No Stress Concern Present  . Feeling of Stress : Not at all  Social Connections: Socially Isolated  . Frequency of Communication with Friends and Family: Twice a week  . Frequency of Social Gatherings with Friends and Family: Never  . Attends Religious Services: Never  . Active Member of Clubs or Organizations: No  . Attends Archivist Meetings:  Never  . Marital Status: Widowed  Intimate Partner Violence: Not At Risk  . Fear of Current or Ex-Partner: No  . Emotionally Abused: No  . Physically Abused: No  . Sexually Abused: No    Outpatient Medications Prior to Visit  Medication Sig Dispense Refill  . amLODipine (NORVASC) 10 MG tablet TAKE 1 TABLET(10 MG) BY MOUTH DAILY 90 tablet 2  . aspirin EC 81 MG tablet Take 81 mg by mouth daily.    . cholecalciferol (VITAMIN D3) 25 MCG (1000 UNIT) tablet Take 1,000 Units by mouth daily.    . Flaxseed, Linseed, (FLAXSEED OIL) 1000 MG CAPS Take 1 capsule by mouth daily.    Marland Kitchen gabapentin (NEURONTIN) 100 MG capsule TAKE 1 CAPSULE BY MOUTH AT BEDTIME FOR BACK PAIN 30 capsule 4  . loratadine (CLARITIN) 10 MG tablet Take 1 tablet (10 mg total) by mouth daily. 30 tablet 11  . meloxicam  (MOBIC) 7.5 MG tablet Take one tablet three times weekly, as needed, for arthritic pain 36 tablet 1  . Polyethyl Glycol-Propyl Glycol 0.4-0.3 % SOLN Apply 1 drop to eye 2 (two) times daily.    . Vitamin D, Cholecalciferol, 50 MCG (2000 UT) CAPS Take by mouth.    Marland Kitchen atorvastatin (LIPITOR) 10 MG tablet Take 1 tablet (10 mg total) by mouth daily. 90 tablet 1  . spironolactone (ALDACTONE) 50 MG tablet Take two tablets by mouth once daily 60 tablet 3   No facility-administered medications prior to visit.    Allergies  Allergen Reactions  . Benazepril Other (See Comments)    Patient does not recall being on this medication and/or what reaction she might have had.  . Metoprolol     CRAMPS    ROS Review of Systems  Constitutional: Negative.   Respiratory: Negative.   Cardiovascular: Negative.       Objective:    Physical Exam Constitutional:      Appearance: Normal appearance.  Cardiovascular:     Rate and Rhythm: Regular rhythm. Tachycardia present.     Pulses: Normal pulses.     Heart sounds: Normal heart sounds.  Pulmonary:     Effort: Pulmonary effort is normal.     Breath sounds: Normal breath sounds.  Neurological:     Mental Status: She is alert.     BP (!) 150/87   Pulse (!) 120   Temp 98.8 F (37.1 C)   Resp 18   Ht $R'5\' 4"'kX$  (1.626 m)   Wt 159 lb (72.1 kg)   SpO2 95%   BMI 27.29 kg/m  Wt Readings from Last 3 Encounters:  09/03/20 159 lb (72.1 kg)  07/08/20 162 lb (73.5 kg)  04/09/20 165 lb (74.8 kg)     Health Maintenance Due  Topic Date Due  . Hepatitis C Screening  Never done  . COVID-19 Vaccine (1) Never done    There are no preventive care reminders to display for this patient.  Lab Results  Component Value Date   TSH 0.895 04/10/2020   Lab Results  Component Value Date   WBC 8.4 04/10/2020   HGB 13.1 04/10/2020   HCT 38.3 04/10/2020   MCV 94 04/10/2020   PLT 254 04/10/2020   Lab Results  Component Value Date   NA 136 08/27/2020   K  5.1 08/27/2020   CO2 21 08/27/2020   GLUCOSE 108 (H) 08/27/2020   BUN 17 08/27/2020   CREATININE 1.04 (H) 08/27/2020   BILITOT 0.5 08/27/2020   ALKPHOS 59 08/27/2020  AST 33 08/27/2020   ALT 13 08/27/2020   PROT 7.1 08/27/2020   ALBUMIN 4.3 08/27/2020   CALCIUM 10.6 (H) 08/27/2020   ANIONGAP 9 08/17/2016   Lab Results  Component Value Date   CHOL 232 (H) 08/27/2020   Lab Results  Component Value Date   HDL 46 08/27/2020   Lab Results  Component Value Date   LDLCALC 164 (H) 08/27/2020   Lab Results  Component Value Date   TRIG 124 08/27/2020   Lab Results  Component Value Date   CHOLHDL 5.0 (H) 08/27/2020   Lab Results  Component Value Date   HGBA1C 5.6 07/01/2016      Assessment & Plan:   Problem List Items Addressed This Visit      Cardiovascular and Mediastinum   Essential hypertension    -she has not been taking 100 mg of spironolactone as prescribed; she states that her bottle only said 1.5 tablets, so she ran out early -INCREASED her dose today -recheck in 3 months with labs      Relevant Medications   atorvastatin (LIPITOR) 20 MG tablet   spironolactone (ALDACTONE) 100 MG tablet   Other Relevant Orders   CBC with Differential/Platelet   CMP14+EGFR     Endocrine   Impaired fasting blood sugar    -will check A1c with next set of labs      Relevant Orders   Hemoglobin A1c     Other   Hyperlipemia    -LDL still elevated despite taking atorvastatin -INCREASE atorvastatin to 20 mg daily -recheck in 3 months      Relevant Medications   atorvastatin (LIPITOR) 20 MG tablet   spironolactone (ALDACTONE) 100 MG tablet   Other Relevant Orders   Lipid Panel With LDL/HDL Ratio   Tachycardia    -she states she is very nervous while in the clinic -she states her HR is not usually this rapid -asymptomatic and in no acute distress -return to clinic if this persists         Meds ordered this encounter  Medications  . atorvastatin (LIPITOR)  20 MG tablet    Sig: Take 1 tablet (20 mg total) by mouth daily.    Dispense:  90 tablet    Refill:  1  . spironolactone (ALDACTONE) 100 MG tablet    Sig: Take 1 tablet (100 mg total) by mouth daily.    Dispense:  90 tablet    Refill:  1    Increased dose today    Follow-up: Return in about 3 months (around 12/02/2020) for Lab follow-up.    Noreene Larsson, NP

## 2020-09-03 NOTE — Patient Instructions (Addendum)
You were seen today for BP check and cholesterol check.  Your cholesterol was higher than on your previous labs. I increased your atorvastatin to lower your cholesterol.  Your BP was slightly elevated. I sent in a new prescription for your spironolactone. You should take ONE of the new 100 mg tabs in the mornings.  We will meet back up in 3 months for lab follow-up. Please have fasting labs completed 2-3 days prior to the appointment.

## 2020-09-03 NOTE — Assessment & Plan Note (Signed)
-  she states she is very nervous while in the clinic -she states her HR is not usually this rapid -asymptomatic and in no acute distress -return to clinic if this persists

## 2020-09-03 NOTE — Assessment & Plan Note (Signed)
-  will check A1c with next set of labs

## 2020-09-03 NOTE — Assessment & Plan Note (Signed)
-  LDL still elevated despite taking atorvastatin -INCREASE atorvastatin to 20 mg daily -recheck in 3 months

## 2020-09-03 NOTE — Assessment & Plan Note (Signed)
-  she has not been taking 100 mg of spironolactone as prescribed; she states that her bottle only said 1.5 tablets, so she ran out early -INCREASED her dose today -recheck in 3 months with labs

## 2020-09-05 ENCOUNTER — Telehealth: Payer: Self-pay

## 2020-09-05 NOTE — Telephone Encounter (Signed)
FYI: Pt called and states that her legs were hurting and cramping and she initially thought it was her medication but she took her stockings off and she thinks they were too tight. She is doing much better now.

## 2020-09-05 NOTE — Telephone Encounter (Signed)
Excellent! If her legs get more swollen or she notices overnight weight gain > 2 pounds she should be seen in the office.

## 2020-09-05 NOTE — Telephone Encounter (Signed)
Pt informed

## 2020-09-15 DIAGNOSIS — E213 Hyperparathyroidism, unspecified: Secondary | ICD-10-CM | POA: Diagnosis not present

## 2020-09-21 LAB — COMPREHENSIVE METABOLIC PANEL WITH GFR
ALT: 14 IU/L (ref 0–32)
AST: 18 IU/L (ref 0–40)
Albumin/Globulin Ratio: 1.5 (ref 1.2–2.2)
Albumin: 4.4 g/dL (ref 3.7–4.7)
Alkaline Phosphatase: 62 IU/L (ref 44–121)
BUN/Creatinine Ratio: 20 (ref 12–28)
BUN: 21 mg/dL (ref 8–27)
Bilirubin Total: 0.6 mg/dL (ref 0.0–1.2)
CO2: 22 mmol/L (ref 20–29)
Calcium: 10.3 mg/dL (ref 8.7–10.3)
Chloride: 99 mmol/L (ref 96–106)
Creatinine, Ser: 1.03 mg/dL — ABNORMAL HIGH (ref 0.57–1.00)
GFR calc Af Amer: 60 mL/min/1.73
GFR calc non Af Amer: 52 mL/min/1.73 — ABNORMAL LOW
Globulin, Total: 2.9 g/dL (ref 1.5–4.5)
Glucose: 108 mg/dL — ABNORMAL HIGH (ref 65–99)
Potassium: 4.4 mmol/L (ref 3.5–5.2)
Sodium: 134 mmol/L (ref 134–144)
Total Protein: 7.3 g/dL (ref 6.0–8.5)

## 2020-09-21 LAB — PTH-RELATED PEPTIDE: PTH-related peptide: <2 pmol/L

## 2020-09-24 ENCOUNTER — Ambulatory Visit: Payer: PPO | Admitting: Nurse Practitioner

## 2020-09-25 ENCOUNTER — Ambulatory Visit: Payer: PPO | Admitting: Nurse Practitioner

## 2020-09-25 ENCOUNTER — Other Ambulatory Visit: Payer: Self-pay

## 2020-09-25 ENCOUNTER — Encounter: Payer: Self-pay | Admitting: Nurse Practitioner

## 2020-09-25 VITALS — BP 156/85 | HR 108 | Ht 64.0 in | Wt 160.0 lb

## 2020-09-25 DIAGNOSIS — E559 Vitamin D deficiency, unspecified: Secondary | ICD-10-CM | POA: Diagnosis not present

## 2020-09-25 DIAGNOSIS — E213 Hyperparathyroidism, unspecified: Secondary | ICD-10-CM | POA: Diagnosis not present

## 2020-09-25 NOTE — Progress Notes (Signed)
 09/25/2020                 Endocrinology follow-up note   Subjective:    Patient ID: Kristen Cox, female    DOB: 09/19/1940, PCP Simpson, Margaret E, MD   Past Medical History:  Diagnosis Date  . Deep vein thrombosis (DVT) (HCC) 08/2016   Right leg  . Dry eyes 2010   on restasis - Dr. Cotter   . Gout 2009  . Hyperlipidemia   . Hypertension   . Spinal stenosis    Past Surgical History:  Procedure Laterality Date  . CESAREAN SECTION    . COLONOSCOPY  04/09/2009   RMR:Anal papilla otherwise, normal rectum/Left-sided diverticula/Cecal polyp status post cold snare and removal/The remainder of the colonic mucosa appeared normal. Adenomatous polyps, next TCS 03/2016 per RMR.  . COLONOSCOPY N/A 05/19/2016   Procedure: COLONOSCOPY;  Surgeon: Robert M Rourk, MD;  Location: [facility];  Service: Endoscopy;  Laterality: N/A;  1:00 PM   Social History   Socioeconomic History  . Marital status: Widowed    Spouse name: Not on file  . Number of children: 2  . Years of education: Not on file  . Highest education level: 10th grade  Occupational History  . Occupation: retired   Tobacco Use  . Smoking status: Former Smoker    Packs/day: 0.50    Years: 10.00    Pack years: 5.00    Types: Cigarettes    Quit date: 10/07/1983    Years since quitting: 36.9  . Smokeless tobacco: Never Used  Vaping Use  . Vaping Use: Never used  Substance and Sexual Activity  . Alcohol use: No  . Drug use: No  . Sexual activity: Never  Other Topics Concern  . Not on file  Social History Narrative  . Not on file   Social Determinants of Health   Financial Resource Strain: Low Risk   . Difficulty of Paying Living Expenses: Not very hard  Food Insecurity: No Food Insecurity  . Worried About Running Out of Food in the Last Year: Never true  . Ran Out of Food in the Last Year: Never true  Transportation Needs: No Transportation Needs  . Lack of Transportation (Medical): No  . Lack of  Transportation (Non-Medical): No  Physical Activity: Insufficiently Active  . Days of Exercise per Week: 2 days  . Minutes of Exercise per Session: 20 min  Stress: No Stress Concern Present  . Feeling of Stress : Not at all  Social Connections: Socially Isolated  . Frequency of Communication with Friends and Family: Twice a week  . Frequency of Social Gatherings with Friends and Family: Never  . Attends Religious Services: Never  . Active Member of Clubs or Organizations: No  . Attends Club or Organization Meetings: Never  . Marital Status: Widowed   Outpatient Encounter Medications as of 09/25/2020  Medication Sig  . amLODipine (NORVASC) 10 MG tablet TAKE 1 TABLET(10 MG) BY MOUTH DAILY  . aspirin EC 81 MG tablet Take 81 mg by mouth daily.  . atorvastatin (LIPITOR) 20 MG tablet Take 1 tablet (20 mg total) by mouth daily.  . cholecalciferol (VITAMIN D3) 25 MCG (1000 UNIT) tablet Take 1,000 Units by mouth daily.  . Flaxseed, Linseed, (FLAXSEED OIL) 1000 MG CAPS Take 1 capsule by mouth daily.  . gabapentin (NEURONTIN) 100 MG capsule TAKE 1 CAPSULE BY MOUTH AT BEDTIME FOR BACK PAIN  . loratadine (CLARITIN) 10 MG tablet Take 1 tablet (  10 mg total) by mouth daily.  . meloxicam (MOBIC) 7.5 MG tablet Take one tablet three times weekly, as needed, for arthritic pain  . Polyethyl Glycol-Propyl Glycol 0.4-0.3 % SOLN Apply 1 drop to eye 2 (two) times daily.  . spironolactone (ALDACTONE) 100 MG tablet Take 1 tablet (100 mg total) by mouth daily.  . [DISCONTINUED] Vitamin D, Cholecalciferol, 50 MCG (2000 UT) CAPS Take by mouth.   No facility-administered encounter medications on file as of 09/25/2020.   ALLERGIES: Allergies  Allergen Reactions  . Benazepril Other (See Comments)    Patient does not recall being on this medication and/or what reaction she might have had.  . Metoprolol     CRAMPS   VACCINATION STATUS: Immunization History  Administered Date(s) Administered  . Fluad Quad(high Dose  65+) 06/06/2019, 07/08/2020, 07/28/2020  . Influenza,inj,Quad PF,6+ Mos 06/12/2014, 08/21/2015, 07/08/2016, 06/23/2017, 04/18/2018  . Pneumococcal Conjugate-13 11/13/2014  . Pneumococcal Polysaccharide-23 02/17/2016  . Tdap 02/01/2011    HPI  79 year old female with medical history as above. She is being seen in follow-up for hypercalcemia related to mild hyperparathyroidism.  She is currently being observed only.  She has not required any surgical intervention nor pharmaceutical intervention at this time.    -Her previsit labs show serum calcium of 10.3, improving from 11.1 before last visit.  Her PTH remains stable. -She is currently taking vitamin D supplement 2000 units of vitamin D3 daily.  -She denies any new complaints.   She denies any family history of parathyroid dysfunction.  She denies any hx of CVA, seizures, nephrolithiasis, nor CKD. she denies Osteoporosis. she is a former smoker.  She has not had a bone density test since 2015.  she denies any new complaints.   Review of systems  Constitutional: + Minimally fluctuating body weight,  current Body mass index is 27.46 kg/m. , no fatigue, no subjective hyperthermia, no subjective hypothermia Eyes: no blurry vision, no xerophthalmia ENT: no sore throat, no nodules palpated in throat, no dysphagia/odynophagia, no hoarseness Cardiovascular: no chest pain, no shortness of breath, no palpitations, no leg swelling Respiratory: no cough, no shortness of breath Gastrointestinal: no nausea/vomiting/diarrhea Musculoskeletal: no muscle/joint aches Skin: no rashes, no hyperemia Neurological: no tremors, no numbness, no tingling, no dizziness Psychiatric: no depression, no anxiety   Objective:    BP (!) 156/85 (BP Location: Left Arm, Patient Position: Sitting)   Pulse (!) 108   Ht 5' 4" (1.626 m)   Wt 160 lb (72.6 kg)   BMI 27.46 kg/m   Wt Readings from Last 3 Encounters:  09/25/20 160 lb (72.6 kg)  09/03/20 159 lb (72.1  kg)  07/08/20 162 lb (73.5 kg)    BP Readings from Last 3 Encounters:  09/25/20 (!) 156/85  09/03/20 (!) 150/87  07/08/20 (!) 158/93      Physical Exam- Limited  Constitutional:  Body mass index is 27.46 kg/m. , not in acute distress, normal state of mind Eyes:  EOMI, no exophthalmos Neck: Supple Thyroid: No gross goiter Cardiovascular: RRR, no murmers, rubs, or gallops, no edema Respiratory: Adequate breathing efforts, no crackles, rales, rhonchi, or wheezing Musculoskeletal: no gross deformities, strength intact in all four extremities, no gross restriction of joint movements Skin:  no rashes, no hyperemia Neurological: no tremor with outstretched hands   Diabetic Labs (most recent): Lab Results  Component Value Date   HGBA1C 5.6 07/01/2016   HGBA1C 6.1 (H) 10/30/2015   HGBA1C 5.9 (H) 03/19/2015    Lipid Panel       Component Value Date/Time   CHOL 232 (H) 08/27/2020 0952   TRIG 124 08/27/2020 0952   HDL 46 08/27/2020 0952   CHOLHDL 5.0 (H) 08/27/2020 0952   CHOLHDL 4.4 06/06/2019 1422   VLDL 19 02/11/2016 0718   LDLCALC 164 (H) 08/27/2020 0952   LDLCALC 139 (H) 06/06/2019 1422   Recent Results (from the past 2160 hour(s))  Lipid panel     Status: Abnormal   Collection Time: 08/27/20  9:52 AM  Result Value Ref Range   Cholesterol, Total 232 (H) 100 - 199 mg/dL   Triglycerides 124 0 - 149 mg/dL   HDL 46 >39 mg/dL   VLDL Cholesterol Cal 22 5 - 40 mg/dL   LDL Chol Calc (NIH) 164 (H) 0 - 99 mg/dL   Chol/HDL Ratio 5.0 (H) 0.0 - 4.4 ratio    Comment:                                   T. Chol/HDL Ratio                                             Men  Women                               1/2 Avg.Risk  3.4    3.3                                   Avg.Risk  5.0    4.4                                2X Avg.Risk  9.6    7.1                                3X Avg.Risk 23.4   11.0   CMP14+EGFR     Status: Abnormal   Collection Time: 08/27/20  9:52 AM  Result Value Ref  Range   Glucose 108 (H) 65 - 99 mg/dL   BUN 17 8 - 27 mg/dL   Creatinine, Ser 1.04 (H) 0.57 - 1.00 mg/dL   GFR calc non Af Amer 51 (L) >59 mL/min/1.73   GFR calc Af Amer 59 (L) >59 mL/min/1.73    Comment: **In accordance with recommendations from the NKF-ASN Task force,**   [facility] is in the process of updating its eGFR calculation to the   2021 CKD-EPI creatinine equation that estimates kidney function   without a race variable.    BUN/Creatinine Ratio 16 12 - 28   Sodium 136 134 - 144 mmol/L   Potassium 5.1 3.5 - 5.2 mmol/L   Chloride 102 96 - 106 mmol/L   CO2 21 20 - 29 mmol/L   Calcium 10.6 (H) 8.7 - 10.3 mg/dL   Total Protein 7.1 6.0 - 8.5 g/dL   Albumin 4.3 3.7 - 4.7 g/dL   Globulin, Total 2.8 1.5 - 4.5 g/dL   Albumin/Globulin Ratio 1.5 1.2 - 2.2   Bilirubin Total 0.5 0.0 - 1.2 mg/dL   Alkaline Phosphatase 59 44 - 121  IU/L    Comment:               **Please note reference interval change**   AST 33 0 - 40 IU/L   ALT 13 0 - 32 IU/L  PTH-related peptide     Status: None   Collection Time: 09/15/20  8:23 AM  Result Value Ref Range   PTH-related peptide <2.0 pmol/L    Comment: This test was developed and its performance characteristics determined by [facility]. It has not been cleared or approved by the Food and Drug Administration. Reference Range: All Ages: <2.0 The PTHrP assay should not be used to exclude cancer or screen tumor patients for humoral hypercalcemia of malignancy (HHM). The results should always be assessed in conjunction with the patient's medical history, clinical examination, and other findings. If test results are clinically discordant, please contact the laboratory.   Comprehensive metabolic panel     Status: Abnormal   Collection Time: 09/15/20  8:23 AM  Result Value Ref Range   Glucose 108 (H) 65 - 99 mg/dL   BUN 21 8 - 27 mg/dL   Creatinine, Ser 1.03 (H) 0.57 - 1.00 mg/dL   GFR calc non Af Amer 52 (L) >59 mL/min/1.73   GFR calc Af Amer 60 >59  mL/min/1.73    Comment: **In accordance with recommendations from the NKF-ASN Task force,**   [facility] is in the process of updating its eGFR calculation to the   2021 CKD-EPI creatinine equation that estimates kidney function   without a race variable.    BUN/Creatinine Ratio 20 12 - 28   Sodium 134 134 - 144 mmol/L   Potassium 4.4 3.5 - 5.2 mmol/L   Chloride 99 96 - 106 mmol/L   CO2 22 20 - 29 mmol/L   Calcium 10.3 8.7 - 10.3 mg/dL   Total Protein 7.3 6.0 - 8.5 g/dL   Albumin 4.4 3.7 - 4.7 g/dL   Globulin, Total 2.9 1.5 - 4.5 g/dL   Albumin/Globulin Ratio 1.5 1.2 - 2.2   Bilirubin Total 0.6 0.0 - 1.2 mg/dL   Alkaline Phosphatase 62 44 - 121 IU/L   AST 18 0 - 40 IU/L   ALT 14 0 - 32 IU/L   Results for Stahlecker, Rexanne M (MRN [id number]) as of 03/24/2020 13:12  Ref. Range 11/09/2018 12:27 06/06/2019 14:22 06/12/2019 09:56 12/12/2019 09:29 03/10/2020 07:49  Calcium Latest Ref Range: 8.6 - 10.4 mg/dL 10.7 (H) 11.0 (H) 10.1 11.1 (H) 10.2   Assessment & Plan:   1. Hypercalcemia Her repeat labs have been reviewed with her.  Her PTH-rp is < 2.0.  Her calcium level is stable at 10.3.  This is still consistent with mild primary hyperparathyroidism.    -She will not require any intervention at this time, neither surgical or pharmaceutical.  We will repeat PTH and calcium levels prior to next visit in 6 months.  - I discussed the potential complications of untreated hypercalcemia.  She is benefiting from continued vitamin D supplement, currently 2000 units of vitamin D3 daily.  Advised her to take maintenance dose of vitamin D3 5000 PO units daily. -She was previously documented to have normal bone density.  She will be considered for Sensipar treatment if calcium levels become and remain elevated at subsequent visits.  - I advised patient to maintain close follow up with Simpson, Margaret E, MD for primary care needs.       - Time spent on this patient care encounter:  20 minutes of which   50%  was spent in  counseling and the rest reviewing  her current and  previous labs / studies and medications  doses and developing a plan for long term care. Amaura M Klimowicz  participated in the discussions, expressed understanding, and voiced agreement with the above plans.  All questions were answered to her satisfaction. she is encouraged to contact clinic should she have any questions or concerns prior to her return visit.   Follow up plan: Return in about 6 months (around 03/25/2021) for Previsit labs.  Whitney Reardon, FNP-BC [facility] [address] [place], [place] [address] Phone: [phone] Fax: [phone]   09/25/2020, 3:42 PM 

## 2020-10-20 ENCOUNTER — Other Ambulatory Visit: Payer: Self-pay

## 2020-10-20 ENCOUNTER — Ambulatory Visit: Payer: PPO

## 2020-10-20 ENCOUNTER — Encounter: Payer: Self-pay | Admitting: Orthopedic Surgery

## 2020-10-20 ENCOUNTER — Ambulatory Visit: Payer: PPO | Admitting: Orthopedic Surgery

## 2020-10-20 VITALS — BP 152/91 | HR 126 | Ht 63.0 in | Wt 156.1 lb

## 2020-10-20 DIAGNOSIS — M1712 Unilateral primary osteoarthritis, left knee: Secondary | ICD-10-CM

## 2020-10-20 DIAGNOSIS — M545 Low back pain, unspecified: Secondary | ICD-10-CM | POA: Diagnosis not present

## 2020-10-20 DIAGNOSIS — G8929 Other chronic pain: Secondary | ICD-10-CM

## 2020-10-20 MED ORDER — METHOCARBAMOL 500 MG PO TABS: 500.0000 mg | ORAL_TABLET | Freq: Three times a day (TID) | ORAL | 1 refills | Status: DC

## 2020-10-20 MED ORDER — PREDNISONE 10 MG (48) PO TBPK: ORAL_TABLET | Freq: Every day | ORAL | 0 refills | Status: DC

## 2020-10-20 NOTE — Progress Notes (Signed)
Chief Complaint  Patient presents with  . Knee Pain    Bilateral/hurting and sore. Back is hurting right much and take Tylenol it helps   80 year old female with left knee pain here for 1 year x-ray for follow-up on her lateral arthritis she has a 12 degree valgus deformity but complains only of soreness in left knee  Today she is more complaining of lower back pain on the right without radicular symptoms she says she wants something for her back   Review of Systems  Constitutional: Negative for chills, fever, malaise/fatigue and weight loss.  Gastrointestinal: Negative for constipation.       Denies loss bowel control   Genitourinary:       Denies urinary retention or los of bladder control    Past Medical History:  Diagnosis Date  . Deep vein thrombosis (DVT) (HCC) 08/2016   Right leg  . Dry eyes 2010   on restasis - Dr. Jorja Loa   . Gout 2009  . Hyperlipidemia   . Hypertension   . Spinal stenosis    BP (!) 156/100   Pulse (!) 126   Ht 5\' 3"  (1.6 m)   Wt 156 lb 2 oz (70.8 kg)   BMI 27.66 kg/m   Physical Exam Constitutional:      General: She is not in acute distress.    Appearance: She is well-developed.     Comments: Well developed, well nourished Normal grooming and hygiene     Cardiovascular:     Comments: No peripheral edema Musculoskeletal:     Comments: Left knee range of motion 5-120 no instability muscle tone normal tenderness lateral compartment no effusion  Skin:    General: Skin is warm and dry.  Neurological:     Mental Status: She is alert and oriented to person, place, and time.     Sensory: No sensory deficit.     Coordination: Coordination normal.     Gait: Gait normal.     Deep Tendon Reflexes: Reflexes are normal and symmetric.  Psychiatric:        Mood and Affect: Mood normal.        Behavior: Behavior normal.        Thought Content: Thought content normal.        Judgment: Judgment normal.     Comments: Affect normal     Tenderness  lumbar spine on the right no radicular symptoms no straight leg raise findings  X-ray knee shows valgus arthritis no significant advancement from last imaging lateral patellofemoral joint is noted to have arthritic changes as well  Meds ordered this encounter  Medications  . methocarbamol (ROBAXIN) 500 MG tablet    Sig: Take 1 tablet (500 mg total) by mouth 3 (three) times daily.    Dispense:  60 tablet    Refill:  1  . predniSONE (STERAPRED UNI-PAK 48 TAB) 10 MG (48) TBPK tablet    Sig: Take by mouth daily.    Dispense:  48 tablet    Refill:  0    Encounter Diagnoses  Name Primary?  . Chronic pain of left knee   . Right-sided low back pain without sciatica, unspecified chronicity Yes

## 2020-10-20 NOTE — Patient Instructions (Addendum)
Start medication for back pain   https://doi.[email or web address]">  Chronic Back Pain When back pain lasts longer than 3 months, it is called chronic back pain. The cause of your back pain may not be known. Some common causes include:  Wear and tear (degenerative disease) of the bones, ligaments, or disks in your back.  Inflammation and stiffness in your back (arthritis). People who have chronic back pain often go through certain periods in which the pain is more intense (flare-ups). Many people can learn to manage the pain with home care. Follow these instructions at home: Pay attention to any changes in your symptoms. Take these actions to help with your pain: Managing pain and stiffness  If directed, apply ice to the painful area. Your health care provider may recommend applying ice during the first 24-48 hours after a flare-up begins. To do this: ? Put ice in a plastic bag. ? Place a towel between your skin and the bag. ? Leave the ice on for 20 minutes, 2-3 times per day.  If directed, apply heat to the affected area as often as told by your health care provider. Use the heat source that your health care provider recommends, such as a moist heat pack or a heating pad. ? Place a towel between your skin and the heat source. ? Leave the heat on for 20-30 minutes. ? Remove the heat if your skin turns bright red. This is especially important if you are unable to feel pain, heat, or cold. You may have a greater risk of getting burned.  Try soaking in a warm tub.      Activity  Avoid bending and other activities that make the problem worse.  Maintain a proper position when standing or sitting: ? When standing, keep your upper back and neck straight, with your shoulders pulled back. Avoid slouching. ? When sitting, keep your back straight and relax your shoulders. Do not round your shoulders or pull them backward.  Do not sit or stand in one place for long periods of  time.  Take brief periods of rest throughout the day. This will reduce your pain. Resting in a lying or standing position is usually better than sitting to rest.  When you are resting for longer periods, mix in some mild activity or stretching between periods of rest. This will help to prevent stiffness and pain.  Get regular exercise. Ask your health care provider what activities are safe for you.  Do not lift anything that is heavier than 10 lb (4.5 kg), or the limit that you are told, until your health care provider says that it is safe. Always use proper lifting technique, which includes: ? Bending your knees. ? Keeping the load close to your body. ? Avoiding twisting.  Sleep on a firm mattress in a comfortable position. Try lying on your side with your knees slightly bent. If you lie on your back, put a pillow under your knees.   Medicines  Treatment may include medicines for pain and inflammation taken by mouth or applied to the skin, prescription pain medicine, or muscle relaxants. Take over-the-counter and prescription medicines only as told by your health care provider.  Ask your health care provider if the medicine prescribed to you: ? Requires you to avoid driving or using machinery. ? Can cause constipation. You may need to take these actions to prevent or treat constipation:  Drink enough fluid to keep your urine pale yellow.  Take over-the-counter or prescription medicines.  Eat foods  that are high in fiber, such as beans, whole grains, and fresh fruits and vegetables.  Limit foods that are high in fat and processed sugars, such as fried or sweet foods. General instructions  Do not use any products that contain nicotine or tobacco, such as cigarettes, e-cigarettes, and chewing tobacco. If you need help quitting, ask your health care provider.  Keep all follow-up visits as told by your health care provider. This is important. Contact a health care provider if:  You  have pain that is not relieved with rest or medicine.  Your pain gets worse, or you have new pain.  You have a high fever.  You have rapid weight loss.  You have trouble doing your normal activities. Get help right away if:  You have weakness or numbness in one or both of your legs or feet.  You have trouble controlling your bladder or your bowels.  You have severe back pain and have any of the following: ? Nausea or vomiting. ? Pain in your abdomen. ? Shortness of breath or you faint. Summary  Chronic back pain is back pain that lasts longer than 3 months.  When a flare-up begins, apply ice to the painful area for the first 24-48 hours.  Apply a moist heat pad or use a heating pad on the painful area as directed by your health care provider.  When you are resting for longer periods, mix in some mild activity or stretching between periods of rest. This will help to prevent stiffness and pain. This information is not intended to replace advice given to you by your health care provider. Make sure you discuss any questions you have with your health care provider. Document Revised: 09/19/2019 Document Reviewed: 09/19/2019 Elsevier Patient Education  2021 [employer].

## 2020-11-25 DIAGNOSIS — I10 Essential (primary) hypertension: Secondary | ICD-10-CM | POA: Diagnosis not present

## 2020-11-25 DIAGNOSIS — E7800 Pure hypercholesterolemia, unspecified: Secondary | ICD-10-CM | POA: Diagnosis not present

## 2020-11-25 DIAGNOSIS — R7301 Impaired fasting glucose: Secondary | ICD-10-CM | POA: Diagnosis not present

## 2020-11-26 LAB — CMP14+EGFR
ALT: 13 IU/L (ref 0–32)
AST: 20 IU/L (ref 0–40)
Albumin/Globulin Ratio: 1.6 (ref 1.2–2.2)
Albumin: 4.2 g/dL (ref 3.7–4.7)
Alkaline Phosphatase: 85 IU/L (ref 44–121)
BUN/Creatinine Ratio: 23 (ref 12–28)
BUN: 21 mg/dL (ref 8–27)
Bilirubin Total: 0.4 mg/dL (ref 0.0–1.2)
CO2: 20 mmol/L (ref 20–29)
Calcium: 10.9 mg/dL — ABNORMAL HIGH (ref 8.7–10.3)
Chloride: 102 mmol/L (ref 96–106)
Creatinine, Ser: 0.93 mg/dL (ref 0.57–1.00)
Globulin, Total: 2.6 g/dL (ref 1.5–4.5)
Glucose: 110 mg/dL — ABNORMAL HIGH (ref 65–99)
Potassium: 4.9 mmol/L (ref 3.5–5.2)
Sodium: 139 mmol/L (ref 134–144)
Total Protein: 6.8 g/dL (ref 6.0–8.5)
eGFR: 63 mL/min/{1.73_m2} (ref >59)

## 2020-11-26 LAB — CBC WITH DIFFERENTIAL/PLATELET
Basophils Absolute: 0 10*3/uL (ref 0.0–0.2)
Basos: 0 %
EOS (ABSOLUTE): 0 10*3/uL (ref 0.0–0.4)
Eos: 0 %
Hematocrit: 37.4 % (ref 34.0–46.6)
Hemoglobin: 12.6 g/dL (ref 11.1–15.9)
Immature Grans (Abs): 0.1 10*3/uL (ref 0.0–0.1)
Immature Granulocytes: 1 %
Lymphocytes Absolute: 2 10*3/uL (ref 0.7–3.1)
Lymphs: 24 %
MCH: 32.1 pg (ref 26.6–33.0)
MCHC: 33.7 g/dL (ref 31.5–35.7)
MCV: 95 fL (ref 79–97)
Monocytes Absolute: 1.1 10*3/uL — ABNORMAL HIGH (ref 0.1–0.9)
Monocytes: 13 %
Neutrophils Absolute: 5.2 10*3/uL (ref 1.4–7.0)
Neutrophils: 62 %
Platelets: 432 10*3/uL (ref 150–450)
RBC: 3.92 x10E6/uL (ref 3.77–5.28)
RDW: 12.7 % (ref 11.7–15.4)
WBC: 8.5 10*3/uL (ref 3.4–10.8)

## 2020-11-26 LAB — LIPID PANEL WITH LDL/HDL RATIO
Cholesterol, Total: 171 mg/dL (ref 100–199)
HDL: 41 mg/dL (ref >39)
LDL Chol Calc (NIH): 107 mg/dL — ABNORMAL HIGH (ref 0–99)
LDL/HDL Ratio: 2.6 ratio (ref 0.0–3.2)
Triglycerides: 128 mg/dL (ref 0–149)
VLDL Cholesterol Cal: 23 mg/dL (ref 5–40)

## 2020-11-26 LAB — HEMOGLOBIN A1C
Est. average glucose Bld gHb Est-mCnc: 123 mg/dL
Hgb A1c MFr Bld: 5.9 % — ABNORMAL HIGH (ref 4.8–5.6)

## 2020-11-26 NOTE — Progress Notes (Signed)
LDL is improved from her last visit. Down to 107 from 164.  A1c is in the prediabetic range.

## 2020-12-03 ENCOUNTER — Other Ambulatory Visit: Payer: Self-pay

## 2020-12-03 ENCOUNTER — Ambulatory Visit (INDEPENDENT_AMBULATORY_CARE_PROVIDER_SITE_OTHER): Payer: PPO | Admitting: Nurse Practitioner

## 2020-12-03 ENCOUNTER — Encounter: Payer: Self-pay | Admitting: Nurse Practitioner

## 2020-12-03 VITALS — BP 163/83 | HR 126 | Temp 98.7°F | Resp 18 | Ht 64.0 in | Wt 150.0 lb

## 2020-12-03 DIAGNOSIS — E213 Hyperparathyroidism, unspecified: Secondary | ICD-10-CM

## 2020-12-03 DIAGNOSIS — R000 Tachycardia, unspecified: Secondary | ICD-10-CM

## 2020-12-03 DIAGNOSIS — R7303 Prediabetes: Secondary | ICD-10-CM

## 2020-12-03 DIAGNOSIS — E7800 Pure hypercholesterolemia, unspecified: Secondary | ICD-10-CM | POA: Diagnosis not present

## 2020-12-03 MED ORDER — ATORVASTATIN CALCIUM 20 MG PO TABS: 20.0000 mg | ORAL_TABLET | Freq: Every day | ORAL | 1 refills | Status: DC

## 2020-12-03 MED ORDER — CARVEDILOL 3.125 MG PO TABS: 3.1250 mg | ORAL_TABLET | Freq: Two times a day (BID) | ORAL | 3 refills | Status: DC

## 2020-12-03 NOTE — Assessment & Plan Note (Addendum)
Lab Results  Component Value Date   CHOL 171 11/25/2020   HDL 41 11/25/2020   LDLCALC 107 (H) 11/25/2020   TRIG 128 11/25/2020   CHOLHDL 5.0 (H) 08/27/2020  -decrease fried/fatty foods -taking atorvastatin; will consider dose increase if still elevated with next set of labs

## 2020-12-03 NOTE — Assessment & Plan Note (Signed)
-  calcium elevated today, but she is followed by endocrinology for hyperparathyroidism

## 2020-12-03 NOTE — Assessment & Plan Note (Addendum)
Lab Results  Component Value Date   HGBA1C 5.9 (H) 11/25/2020  -prediabetic, no need to add or change meds today

## 2020-12-03 NOTE — Assessment & Plan Note (Addendum)
-  had tachycardia at last OV -asymptomatic today -EKG today shows sinus tach -referral to cardiology

## 2020-12-03 NOTE — Progress Notes (Signed)
Established Patient Office Visit  Subjective:  Patient ID: Kristen Cox, female    DOB: Dec 15, 1940  Age: 80 y.o. MRN: [id number]  CC:  Chief Complaint  Patient presents with  . Hypertension    HPI Kristen Cox presents for lab follow-up.  She had impaired fasting glucose, and her A1c is 5.9 now.  On her previous set of labs, her LDL was 164.  Now, her LDL is at 107, so this is much improved.    She had elevated calcium previously, but it returned to normal limits. Today, her Ca is still elevated at 10.9.  Past Medical History:  Diagnosis Date  . Deep vein thrombosis (DVT) (HCC) 08/2016   Right leg  . Dry eyes 2010   on restasis - Dr. Jorja Loa   . Gout 2009  . Hyperlipidemia   . Hypertension   . Spinal stenosis     Past Surgical History:  Procedure Laterality Date  . CESAREAN SECTION    . COLONOSCOPY  04/09/2009   WUJ:WJXB papilla otherwise, normal rectum/Left-sided diverticula/Cecal polyp status post cold snare and removal/The remainder of the colonic mucosa appeared normal. Adenomatous polyps, next TCS 03/2016 per RMR.  . COLONOSCOPY N/A 05/19/2016   Procedure: COLONOSCOPY;  Surgeon: Daneil Dolin, MD;  [facility]: [facility];  Service: Endoscopy;  Laterality: N/A;  1:00 PM    Family History  Problem Relation Age of Onset  . Stroke Mother   . Stroke Father   . Diabetes Sister   . Pulmonary embolism Sister   . Cirrhosis Brother   . Colon cancer Neg Hx     Social History   Socioeconomic History  . Marital status: Widowed    Spouse name: Not on file  . Number of children: 2  . Years of education: Not on file  . Highest education level: 10th grade  Occupational History  . Occupation: retired   Tobacco Use  . Smoking status: Former Smoker    Packs/day: 0.50    Years: 10.00    Pack years: 5.00    Types: Cigarettes    Quit date: 10/07/1983    Years since quitting: 37.1  . Smokeless tobacco: Never Used  Vaping Use  . Vaping Use: Never used   Substance and Sexual Activity  . Alcohol use: No  . Drug use: No  . Sexual activity: Never  Other Topics Concern  . Not on file  Social History Narrative  . Not on file   Social Determinants of Health   Financial Resource Strain: Low Risk   . Difficulty of Paying Living Expenses: Not very hard  Food Insecurity: No Food Insecurity  . Worried About Charity fundraiser in the Last Year: Never true  . Ran Out of Food in the Last Year: Never true  Transportation Needs: No Transportation Needs  . Lack of Transportation (Medical): No  . Lack of Transportation (Non-Medical): No  Physical Activity: Insufficiently Active  . Days of Exercise per Week: 2 days  . Minutes of Exercise per Session: 20 min  Stress: No Stress Concern Present  . Feeling of Stress : Not at all  Social Connections: Socially Isolated  . Frequency of Communication with Friends and Family: Twice a week  . Frequency of Social Gatherings with Friends and Family: Never  . Attends Religious Services: Never  . Active Member of Clubs or Organizations: No  . Attends Archivist Meetings: Never  . Marital Status: Widowed  Intimate Partner Violence: Not  At Risk  . Fear of Current or Ex-Partner: No  . Emotionally Abused: No  . Physically Abused: No  . Sexually Abused: No    Outpatient Medications Prior to Visit  Medication Sig Dispense Refill  . amLODipine (NORVASC) 10 MG tablet TAKE 1 TABLET(10 MG) BY MOUTH DAILY 90 tablet 2  . aspirin EC 81 MG tablet Take 81 mg by mouth daily.    . cholecalciferol (VITAMIN D3) 25 MCG (1000 UNIT) tablet Take 1,000 Units by mouth daily.    . Flaxseed, Linseed, (FLAXSEED OIL) 1000 MG CAPS Take 1 capsule by mouth daily.    Marland Kitchen gabapentin (NEURONTIN) 100 MG capsule TAKE 1 CAPSULE BY MOUTH AT BEDTIME FOR BACK PAIN 30 capsule 4  . loratadine (CLARITIN) 10 MG tablet Take 1 tablet (10 mg total) by mouth daily. 30 tablet 11  . meloxicam (MOBIC) 7.5 MG tablet Take one tablet three times  weekly, as needed, for arthritic pain 36 tablet 1  . methocarbamol (ROBAXIN) 500 MG tablet Take 1 tablet (500 mg total) by mouth 3 (three) times daily. 60 tablet 1  . Polyethyl Glycol-Propyl Glycol 0.4-0.3 % SOLN Apply 1 drop to eye 2 (two) times daily.    Marland Kitchen spironolactone (ALDACTONE) 100 MG tablet Take 1 tablet (100 mg total) by mouth daily. 90 tablet 1  . atorvastatin (LIPITOR) 20 MG tablet Take 1 tablet (20 mg total) by mouth daily. 90 tablet 1  . predniSONE (STERAPRED UNI-PAK 48 TAB) 10 MG (48) TBPK tablet Take by mouth daily. 48 tablet 0   No facility-administered medications prior to visit.    Allergies  Allergen Reactions  . Benazepril Other (See Comments)    Patient does not recall being on this medication and/or what reaction she might have had.  . Metoprolol     CRAMPS    ROS Review of Systems  Constitutional: Negative.   Respiratory: Negative.   Cardiovascular: Negative.   Musculoskeletal: Negative.   Neurological: Negative.       Objective:    Physical Exam Constitutional:      Appearance: Normal appearance.  Cardiovascular:     Rate and Rhythm: Regular rhythm. Tachycardia present.     Pulses: Normal pulses.     Heart sounds: Normal heart sounds.  Pulmonary:     Effort: Pulmonary effort is normal.     Breath sounds: Normal breath sounds.  Musculoskeletal:        General: Normal range of motion.  Neurological:     Mental Status: She is alert.  Psychiatric:        Mood and Affect: Mood normal.        Behavior: Behavior normal.        Thought Content: Thought content normal.        Judgment: Judgment normal.     BP (!) 163/83   Pulse (!) 126   Temp 98.7 F (37.1 C)   Resp 18   Ht 5\' 4"  (1.626 m)   Wt 150 lb (68 kg)   SpO2 95%   BMI 25.75 kg/m  Wt Readings from Last 3 Encounters:  12/03/20 150 lb (68 kg)  10/20/20 156 lb 2 oz (70.8 kg)  09/25/20 160 lb (72.6 kg)     Health Maintenance Due  Topic Date Due  . Hepatitis C Screening  Never  done  . COVID-19 Vaccine (1) Never done    There are no preventive care reminders to display for this patient.  Lab Results  Component Value Date  TSH 0.895 04/10/2020   Lab Results  Component Value Date   WBC 8.5 11/25/2020   HGB 12.6 11/25/2020   HCT 37.4 11/25/2020   MCV 95 11/25/2020   PLT 432 11/25/2020   Lab Results  Component Value Date   NA 139 11/25/2020   K 4.9 11/25/2020   CO2 20 11/25/2020   GLUCOSE 110 (H) 11/25/2020   BUN 21 11/25/2020   CREATININE 0.93 11/25/2020   BILITOT 0.4 11/25/2020   ALKPHOS 85 11/25/2020   AST 20 11/25/2020   ALT 13 11/25/2020   PROT 6.8 11/25/2020   ALBUMIN 4.2 11/25/2020   CALCIUM 10.9 (H) 11/25/2020   ANIONGAP 9 08/17/2016   Lab Results  Component Value Date   CHOL 171 11/25/2020   Lab Results  Component Value Date   HDL 41 11/25/2020   Lab Results  Component Value Date   LDLCALC 107 (H) 11/25/2020   Lab Results  Component Value Date   TRIG 128 11/25/2020   Lab Results  Component Value Date   CHOLHDL 5.0 (H) 08/27/2020   Lab Results  Component Value Date   HGBA1C 5.9 (H) 11/25/2020      Assessment & Plan:   Problem List Items Addressed This Visit      Endocrine   Hyperparathyroidism ([place])    -followed by Dr. Stan Head  -reviewed last note which states, "Hypercalcemia Her repeat labs have been reviewed with her.  Her PTH-rp is < 2.0.  Her calcium level is stable at 10.3.  This is still consistent with mild primary hyperparathyroidism.    -She will not require any intervention at this time, neither surgical or pharmaceutical.  We will repeat PTH and calcium levels prior to next visit in 6 months.  - I discussed the potential complications of untreated hypercalcemia.  She is benefiting from continued vitamin D supplement, currently 2000 units of vitamin D3 daily.  Advised her to take maintenance dose of vitamin D3 5000 PO units daily. -She was previously documented to have normal bone  density.  She will be considered for Sensipar treatment if calcium levels become and remain elevated at subsequent visits."         Other   Hyperlipemia    Lab Results  Component Value Date   CHOL 171 11/25/2020   HDL 41 11/25/2020   LDLCALC 107 (H) 11/25/2020   TRIG 128 11/25/2020   CHOLHDL 5.0 (H) 08/27/2020  -decrease fried/fatty foods -taking atorvastatin; will consider dose increase if still elevated with next set of labs      Relevant Medications   atorvastatin (LIPITOR) 20 MG tablet   carvedilol (COREG) 3.125 MG tablet   Prediabetes    Lab Results  Component Value Date   HGBA1C 5.9 (H) 11/25/2020  -prediabetic, no need to add or change meds today       Hypercalcemia    -calcium elevated today, but she is followed by endocrinology for hyperparathyroidism      Tachycardia - Primary    -had tachycardia at last OV -asymptomatic today -EKG today shows sinus tach -referral to cardiology      Relevant Orders   Ambulatory referral to Cardiology      Meds ordered this encounter  Medications  . atorvastatin (LIPITOR) 20 MG tablet    Sig: Take 1 tablet (20 mg total) by mouth daily.    Dispense:  90 tablet    Refill:  1  . carvedilol (COREG) 3.125 MG tablet    Sig: Take 1  tablet (3.125 mg total) by mouth 2 (two) times daily with a meal.    Dispense:  60 tablet    Refill:  3    Follow-up: Return in about 1 month (around 01/02/2021) for BP check.    Noreene Larsson, NP

## 2020-12-03 NOTE — Assessment & Plan Note (Signed)
-  followed by Dr. Stan Head  -reviewed last note which states, "Hypercalcemia Her repeat labs have been reviewed with her.  Her PTH-rp is < 2.0.  Her calcium level is stable at 10.3.  This is still consistent with mild primary hyperparathyroidism.    -She will not require any intervention at this time, neither surgical or pharmaceutical.  We will repeat PTH and calcium levels prior to next visit in 6 months.  - I discussed the potential complications of untreated hypercalcemia.  She is benefiting from continued vitamin D supplement, currently 2000 units of vitamin D3 daily.  Advised her to take maintenance dose of vitamin D3 5000 PO units daily. -She was previously documented to have normal bone density.  She will be considered for Sensipar treatment if calcium levels become and remain elevated at subsequent visits."

## 2020-12-05 ENCOUNTER — Other Ambulatory Visit (HOSPITAL_COMMUNITY)
Admission: RE | Admit: 2020-12-05 | Discharge: 2020-12-05 | Disposition: A | Discharge status: Home / Self Care | Payer: PPO | Source: Ambulatory Visit | Attending: Internal Medicine | Admitting: Internal Medicine

## 2020-12-05 ENCOUNTER — Other Ambulatory Visit: Payer: Self-pay

## 2020-12-05 ENCOUNTER — Encounter: Payer: Self-pay | Admitting: Internal Medicine

## 2020-12-05 ENCOUNTER — Other Ambulatory Visit: Payer: Self-pay | Admitting: Internal Medicine

## 2020-12-05 ENCOUNTER — Ambulatory Visit: Payer: PPO | Admitting: Internal Medicine

## 2020-12-05 ENCOUNTER — Ambulatory Visit (INDEPENDENT_AMBULATORY_CARE_PROVIDER_SITE_OTHER): Payer: PPO

## 2020-12-05 VITALS — BP 126/70 | HR 90 | Ht 64.0 in | Wt 151.0 lb

## 2020-12-05 DIAGNOSIS — I10 Essential (primary) hypertension: Secondary | ICD-10-CM | POA: Diagnosis not present

## 2020-12-05 DIAGNOSIS — R002 Palpitations: Secondary | ICD-10-CM

## 2020-12-05 DIAGNOSIS — Z79899 Other long term (current) drug therapy: Secondary | ICD-10-CM

## 2020-12-05 LAB — BASIC METABOLIC PANEL
Anion gap: 10 (ref 5–15)
BUN: 25 mg/dL — ABNORMAL HIGH (ref 8–23)
CO2: 23 mmol/L (ref 22–32)
Calcium: 10.5 mg/dL — ABNORMAL HIGH (ref 8.9–10.3)
Chloride: 93 mmol/L — ABNORMAL LOW (ref 98–111)
Creatinine, Ser: 1.15 mg/dL — ABNORMAL HIGH (ref 0.44–1.00)
GFR, Estimated: 48 mL/min — ABNORMAL LOW (ref >60)
Glucose, Bld: 113 mg/dL — ABNORMAL HIGH (ref 70–99)
Potassium: 4.5 mmol/L (ref 3.5–5.1)
Sodium: 126 mmol/L — ABNORMAL LOW (ref 135–145)

## 2020-12-05 MED ORDER — ROSUVASTATIN CALCIUM 40 MG PO TABS: 40.0000 mg | ORAL_TABLET | Freq: Every day | ORAL | 3 refills | Status: DC

## 2020-12-05 MED ORDER — ATORVASTATIN CALCIUM 40 MG PO TABS
40.0000 mg | ORAL_TABLET | Freq: Every day | ORAL | 3 refills | Status: DC
Start: 2020-12-05 — End: 2021-03-02

## 2020-12-05 NOTE — Patient Instructions (Addendum)
Medication Instructions:  Your physician has recommended you make the following change in your medication:  INCREASE Lipitor (Atorvastatin) to 40 mg daily  *If you need a refill on your cardiac medications before your next appointment, please call your pharmacy*   Lab Work: BMET  If you have labs (blood work) drawn today and your tests are completely normal, you will receive your results only by: Marland Kitchen MyChart Message (if you have MyChart) OR . A paper copy in the mail If you have any lab test that is abnormal or we need to change your treatment, we will call you to review the results.   Testing/Procedures: None   Follow-Up: At [facility], you and your health needs are our priority.  As part of our continuing mission to provide you with exceptional heart care, we have created designated Provider Care Teams.  These Care Teams include your primary Cardiologist (physician) and Advanced Practice Providers (APPs -  Physician Assistants and Nurse Practitioners) who all work together to provide you with the care you need, when you need it.  We recommend signing up for the patient portal called "MyChart".  Sign up information is provided on this After Visit Summary.  MyChart is used to connect with patients for Virtual Visits (Telemedicine).  Patients are able to view lab/test results, encounter notes, upcoming appointments, etc.  Non-urgent messages can be sent to your provider as well.   To learn more about what you can do with MyChart, go to [email or web address].    Your next appointment:   Follow up to be determined.   Other Instructions ZIO AT  . Call [place] with any questions during wear 24/7: [phone] . Zio patch should be worn for 14 days unless otherwise instructed . The [facility]) will call you 24-48 hrs. after Elwyn Reach is applied, be sure to answer the phone from a 224 area code.  They may call during wear with important information regarding your heart, so  answer any calls from [employer] . Do not shower for 24 hours after Zio is applied (sponge bath is fine, just keep the patch dry) . After that, when showering avoid direct shower stream of water onto Zio patch, pat dry around the patch . Avoid excessive sweating . Push the button when you are feeling any cardiac symptoms and record them in the logbook or on the Zio app . Refer to the removal date listed on the front of the symptom logbook and remove Zio patch according to the instructions in the back of the logbook . Place Zio patch inside transmitter (sticky side facing up, button facing down) and put both the gateway and symptom logbook in the prepaid mailing envelope included in the back of the transmitter.  Place inside mailbox or any USPS mailbox

## 2020-12-05 NOTE — Progress Notes (Signed)
Cardiology Office Note   Date:  12/05/2020   ID:  Kristen, Cox 09-29-1940, MRN [id number]  PCP:  Fayrene Helper, MD  Cardiologist:   Dorris Carnes, MD   Patient presents for eval of tachycardia     History of Present Illness: Kristen Cox is a 80 y.o. female with a history of DVT (R leg, 2018), HL, HTN and hyperparathyroidism.   She is followed by Richardean Canal, NP   Pt seen in clinic on 10/20/20  BP 152/91  P 126   She was seen by Richardean Canal on 4/13   Note made that she was tachycardiac on previous office visit and on EKG on 4/13   Set up to see cardiology  HR in clinic on 4/13 documented at 126  BP 163/83   Review of previous clinic vitals :  09/25/20:  108    09/03/20;  120    07/08/20:  103    The pt denies dizziness Denies palpitations   Breathing is OK   No CP     She is not very active       Current Meds  Medication Sig  . amLODipine (NORVASC) 10 MG tablet TAKE 1 TABLET(10 MG) BY MOUTH DAILY  . aspirin EC 81 MG tablet Take 81 mg by mouth daily.  Marland Kitchen atorvastatin (LIPITOR) 20 MG tablet Take 1 tablet (20 mg total) by mouth daily.  . carvedilol (COREG) 3.125 MG tablet Take 1 tablet (3.125 mg total) by mouth 2 (two) times daily with a meal.  . cholecalciferol (VITAMIN D3) 25 MCG (1000 UNIT) tablet Take 1,000 Units by mouth daily.  . Flaxseed, Linseed, (FLAXSEED OIL) 1000 MG CAPS Take 1 capsule by mouth daily.  Marland Kitchen gabapentin (NEURONTIN) 100 MG capsule TAKE 1 CAPSULE BY MOUTH AT BEDTIME FOR BACK PAIN  . loratadine (CLARITIN) 10 MG tablet Take 1 tablet (10 mg total) by mouth daily.  . meloxicam (MOBIC) 7.5 MG tablet Take one tablet three times weekly, as needed, for arthritic pain  . methocarbamol (ROBAXIN) 500 MG tablet Take 1 tablet (500 mg total) by mouth 3 (three) times daily.  Vladimir Faster Glycol-Propyl Glycol 0.4-0.3 % SOLN Apply 1 drop to eye 2 (two) times daily.  Marland Kitchen spironolactone (ALDACTONE) 100 MG tablet Take 1 tablet (100 mg total) by mouth daily.     Allergies:    Benazepril and Metoprolol   Past Medical History:  Diagnosis Date  . Deep vein thrombosis (DVT) (HCC) 08/2016   Right leg  . Dry eyes 2010   on restasis - Dr. Jorja Loa   . Gout 2009  . Hyperlipidemia   . Hypertension   . Spinal stenosis     Past Surgical History:  Procedure Laterality Date  . CESAREAN SECTION    . COLONOSCOPY  04/09/2009   TIR:WERX papilla otherwise, normal rectum/Left-sided diverticula/Cecal polyp status post cold snare and removal/The remainder of the colonic mucosa appeared normal. Adenomatous polyps, next TCS 03/2016 per RMR.  . COLONOSCOPY N/A 05/19/2016   Procedure: COLONOSCOPY;  Surgeon: Daneil Dolin, MD;  Location: [facility];  Service: Endoscopy;  Laterality: N/A;  1:00 PM     Social History:  The patient  reports that she quit smoking about 37 years ago. Her smoking use included cigarettes. She has a 5.00 pack-year smoking history. She has never used smokeless tobacco. She reports that she does not drink alcohol and does not use drugs.   Family History:  The patient's family history includes  Cirrhosis in her brother; Diabetes in her sister; Pulmonary embolism in her sister; Stroke in her father and mother.    ROS:  Please see the history of present illness. All other systems are reviewed and  Negative to the above problem except as noted.    PHYSICAL EXAM: VS:  BP 126/70   Pulse 90   Ht 5\' 4"  (1.626 m)   Wt 151 lb (68.5 kg)   SpO2 97%   BMI 25.92 kg/m   GEN: Well nourished, well developed, in no acute distress  HEENT: normal  Neck: no JVD, carotid bruits Cardiac: RRR; no murmurs  Tr Le  edema  Respiratory:  clear to auscultation bilaterally GI: soft, nontender, nondistended, + BS  No hepatomegaly  MS: no deformity Moving all extremities   Skin: warm and dry, no rash Neuro:  Strength and sensation are intact Psych: euthymic mood, full affect   EKG:  SR 78  PAC  Septal MI  EKG on 12/03/20:  Probable ST with PVCs 1 couplet   115 bpm    Septal infarct On 07/08/20:  ST  113 bpm  Occasional PVC     Lipid Panel    Component Value Date/Time   CHOL 171 11/25/2020 0915   TRIG 128 11/25/2020 0915   HDL 41 11/25/2020 0915   CHOLHDL 5.0 (H) 08/27/2020 0952   CHOLHDL 4.4 06/06/2019 1422   VLDL 19 02/11/2016 0718   LDLCALC 107 (H) 11/25/2020 0915   LDLCALC 139 (H) 06/06/2019 1422      Wt Readings from Last 3 Encounters:  12/05/20 151 lb (68.5 kg)  12/03/20 150 lb (68 kg)  10/20/20 156 lb 2 oz (70.8 kg)      ASSESSMENT AND PLAN:  1   Tachycardia   Pt was not tachycardic today in clinic   Will set up for Zio patch to document average HR control, r/o arrhythmias    2  HTN  BP controlled on current regimen   Check labs  3  HL  Patient with atherosclerosis of aorta   Recomm increasing lipitro for tighter control of lipids    F/U labs with Dr Moshe Cipro         Current medicines are reviewed at length with the patient today.  The patient does not have concerns regarding medicines.  Signed, Dorris Carnes, MD  12/05/2020 8:54 AM    [address] [place], [place], [place]  [address] Phone: [phone]; Fax: [phone]

## 2020-12-07 DIAGNOSIS — R002 Palpitations: Secondary | ICD-10-CM

## 2020-12-10 ENCOUNTER — Telehealth: Payer: Self-pay | Admitting: *Deleted

## 2020-12-10 DIAGNOSIS — I10 Essential (primary) hypertension: Secondary | ICD-10-CM

## 2020-12-10 NOTE — Telephone Encounter (Signed)
Pt notified and order placed 

## 2020-12-10 NOTE — Telephone Encounter (Signed)
-----   Message from Fay Records, MD sent at 12/09/2020 10:42 PM EDT ----- Sodium wa low on last lab check   Kidney function stable Would recomm BMET this week

## 2020-12-11 ENCOUNTER — Other Ambulatory Visit: Payer: Self-pay

## 2020-12-11 ENCOUNTER — Other Ambulatory Visit (HOSPITAL_COMMUNITY)
Admission: RE | Admit: 2020-12-11 | Discharge: 2020-12-11 | Disposition: A | Discharge status: Home / Self Care | Payer: PPO | Source: Ambulatory Visit | Attending: Internal Medicine | Admitting: Internal Medicine

## 2020-12-11 DIAGNOSIS — I10 Essential (primary) hypertension: Secondary | ICD-10-CM | POA: Diagnosis not present

## 2020-12-11 LAB — BASIC METABOLIC PANEL
Anion gap: 10 (ref 5–15)
BUN: 32 mg/dL — ABNORMAL HIGH (ref 8–23)
CO2: 21 mmol/L — ABNORMAL LOW (ref 22–32)
Calcium: 10.3 mg/dL (ref 8.9–10.3)
Chloride: 95 mmol/L — ABNORMAL LOW (ref 98–111)
Creatinine, Ser: 1.15 mg/dL — ABNORMAL HIGH (ref 0.44–1.00)
GFR, Estimated: 48 mL/min — ABNORMAL LOW (ref >60)
Glucose, Bld: 122 mg/dL — ABNORMAL HIGH (ref 70–99)
Potassium: 4.5 mmol/L (ref 3.5–5.1)
Sodium: 126 mmol/L — ABNORMAL LOW (ref 135–145)

## 2020-12-12 ENCOUNTER — Telehealth: Payer: Self-pay | Admitting: *Deleted

## 2020-12-12 DIAGNOSIS — Z79899 Other long term (current) drug therapy: Secondary | ICD-10-CM

## 2020-12-12 DIAGNOSIS — R002 Palpitations: Secondary | ICD-10-CM | POA: Diagnosis not present

## 2020-12-12 DIAGNOSIS — R350 Frequency of micturition: Secondary | ICD-10-CM

## 2020-12-12 DIAGNOSIS — I10 Essential (primary) hypertension: Secondary | ICD-10-CM

## 2020-12-12 NOTE — Telephone Encounter (Signed)
Fay Records, MD  12/11/2020 8:46 PM EDT Back to Top     Sodium remains a little low  Spironolactone may be too much Would hold for a day and start 25mg    F/U BMET, TSH, CBC and urinalysis next week after next.  If she has fevers, SOB call sooner or go to    Pt notified and voiced understanding

## 2020-12-12 NOTE — Telephone Encounter (Signed)
Error

## 2020-12-14 ENCOUNTER — Other Ambulatory Visit: Payer: Self-pay | Admitting: Family Medicine

## 2020-12-19 ENCOUNTER — Telehealth: Payer: Self-pay

## 2020-12-19 MED ORDER — CARVEDILOL 6.25 MG PO TABS: 6.2500 mg | ORAL_TABLET | Freq: Two times a day (BID) | ORAL | 3 refills | Status: DC

## 2020-12-19 NOTE — Telephone Encounter (Signed)
-----   Message from Harper Woods, MD sent at 12/18/2020 12:25 PM EDT ----- Monitor showed predominantly SR   Occasional PACs, PVCs   Few short bursts of NSVT and SVT which she did not sense Could increase carvedilol to 6.25 bid and follow BP and HR and symptoms

## 2020-12-19 NOTE — Telephone Encounter (Signed)
Patient voiced understanding and voiced agreement with medication changes and to track HR/BP/symptoms

## 2020-12-26 ENCOUNTER — Other Ambulatory Visit: Payer: Self-pay

## 2020-12-26 ENCOUNTER — Other Ambulatory Visit (HOSPITAL_COMMUNITY)
Admission: RE | Admit: 2020-12-26 | Discharge: 2020-12-26 | Disposition: A | Discharge status: Home / Self Care | Payer: PPO | Source: Ambulatory Visit | Attending: Internal Medicine | Admitting: Internal Medicine

## 2020-12-26 DIAGNOSIS — R350 Frequency of micturition: Secondary | ICD-10-CM | POA: Diagnosis present

## 2020-12-26 DIAGNOSIS — Z79899 Other long term (current) drug therapy: Secondary | ICD-10-CM | POA: Insufficient documentation

## 2020-12-26 DIAGNOSIS — I10 Essential (primary) hypertension: Secondary | ICD-10-CM

## 2020-12-26 LAB — URINALYSIS, ROUTINE W REFLEX MICROSCOPIC
Bacteria, UA: NONE SEEN
Bilirubin Urine: NEGATIVE
Glucose, UA: NEGATIVE mg/dL
Ketones, ur: NEGATIVE mg/dL
Leukocytes,Ua: NEGATIVE
Nitrite: NEGATIVE
Protein, ur: NEGATIVE mg/dL
Specific Gravity, Urine: 1.003 — ABNORMAL LOW (ref 1.005–1.030)
pH: 6 (ref 5.0–8.0)

## 2020-12-26 LAB — CBC
HCT: 33.3 % — ABNORMAL LOW (ref 36.0–46.0)
Hemoglobin: 11.5 g/dL — ABNORMAL LOW (ref 12.0–15.0)
MCH: 32.2 pg (ref 26.0–34.0)
MCHC: 34.5 g/dL (ref 30.0–36.0)
MCV: 93.3 fL (ref 80.0–100.0)
Platelets: 259 10*3/uL (ref 150–400)
RBC: 3.57 MIL/uL — ABNORMAL LOW (ref 3.87–5.11)
RDW: 13.1 % (ref 11.5–15.5)
WBC: 5.4 10*3/uL (ref 4.0–10.5)
nRBC: 0 % (ref 0.0–0.2)

## 2020-12-26 LAB — BASIC METABOLIC PANEL
Anion gap: 7 (ref 5–15)
BUN: 10 mg/dL (ref 8–23)
CO2: 23 mmol/L (ref 22–32)
Calcium: 9.5 mg/dL (ref 8.9–10.3)
Chloride: 97 mmol/L — ABNORMAL LOW (ref 98–111)
Creatinine, Ser: 0.72 mg/dL (ref 0.44–1.00)
GFR, Estimated: >60 mL/min (ref >60)
Glucose, Bld: 119 mg/dL — ABNORMAL HIGH (ref 70–99)
Potassium: 3.9 mmol/L (ref 3.5–5.1)
Sodium: 127 mmol/L — ABNORMAL LOW (ref 135–145)

## 2020-12-26 LAB — TSH: TSH: 1.098 u[IU]/mL (ref 0.350–4.500)

## 2020-12-29 ENCOUNTER — Telehealth: Payer: Self-pay | Admitting: *Deleted

## 2020-12-29 DIAGNOSIS — I10 Essential (primary) hypertension: Secondary | ICD-10-CM

## 2020-12-29 NOTE — Telephone Encounter (Signed)
-----   Message from Rodman Key, RN sent at 12/29/2020  3:45 PM EDT ----- Linna Hoff patient.

## 2020-12-29 NOTE — Telephone Encounter (Signed)
Pt notified and orders placed for labs.

## 2021-01-06 ENCOUNTER — Ambulatory Visit: Payer: PPO | Admitting: Family Medicine

## 2021-01-08 ENCOUNTER — Ambulatory Visit: Payer: PPO | Admitting: Family Medicine

## 2021-01-12 DIAGNOSIS — I739 Peripheral vascular disease, unspecified: Secondary | ICD-10-CM | POA: Diagnosis not present

## 2021-01-12 DIAGNOSIS — B351 Tinea unguium: Secondary | ICD-10-CM | POA: Diagnosis not present

## 2021-01-20 ENCOUNTER — Encounter: Payer: Self-pay | Admitting: Family Medicine

## 2021-01-20 ENCOUNTER — Other Ambulatory Visit (HOSPITAL_COMMUNITY)
Admission: RE | Admit: 2021-01-20 | Discharge: 2021-01-20 | Disposition: A | Discharge status: Home / Self Care | Payer: PPO | Source: Ambulatory Visit | Attending: Internal Medicine | Admitting: Internal Medicine

## 2021-01-20 ENCOUNTER — Other Ambulatory Visit (HOSPITAL_COMMUNITY): Payer: Self-pay | Admitting: Family Medicine

## 2021-01-20 ENCOUNTER — Ambulatory Visit (INDEPENDENT_AMBULATORY_CARE_PROVIDER_SITE_OTHER): Payer: PPO | Admitting: Family Medicine

## 2021-01-20 ENCOUNTER — Other Ambulatory Visit: Payer: Self-pay

## 2021-01-20 VITALS — BP 124/70 | HR 65 | Temp 97.7°F | Ht 64.0 in | Wt 148.0 lb

## 2021-01-20 DIAGNOSIS — E559 Vitamin D deficiency, unspecified: Secondary | ICD-10-CM | POA: Diagnosis not present

## 2021-01-20 DIAGNOSIS — R7303 Prediabetes: Secondary | ICD-10-CM

## 2021-01-20 DIAGNOSIS — Z1231 Encounter for screening mammogram for malignant neoplasm of breast: Secondary | ICD-10-CM

## 2021-01-20 DIAGNOSIS — Z1159 Encounter for screening for other viral diseases: Secondary | ICD-10-CM | POA: Diagnosis not present

## 2021-01-20 DIAGNOSIS — I10 Essential (primary) hypertension: Secondary | ICD-10-CM

## 2021-01-20 DIAGNOSIS — Z79899 Other long term (current) drug therapy: Secondary | ICD-10-CM | POA: Insufficient documentation

## 2021-01-20 DIAGNOSIS — E7800 Pure hypercholesterolemia, unspecified: Secondary | ICD-10-CM | POA: Diagnosis not present

## 2021-01-20 DIAGNOSIS — M170 Bilateral primary osteoarthritis of knee: Secondary | ICD-10-CM | POA: Insufficient documentation

## 2021-01-20 DIAGNOSIS — J309 Allergic rhinitis, unspecified: Secondary | ICD-10-CM | POA: Diagnosis not present

## 2021-01-20 LAB — BASIC METABOLIC PANEL
Anion gap: 6 (ref 5–15)
BUN: 24 mg/dL — ABNORMAL HIGH (ref 8–23)
CO2: 26 mmol/L (ref 22–32)
Calcium: 10 mg/dL (ref 8.9–10.3)
Chloride: 103 mmol/L (ref 98–111)
Creatinine, Ser: 0.8 mg/dL (ref 0.44–1.00)
GFR, Estimated: >60 mL/min (ref >60)
Glucose, Bld: 109 mg/dL — ABNORMAL HIGH (ref 70–99)
Potassium: 3.6 mmol/L (ref 3.5–5.1)
Sodium: 135 mmol/L (ref 135–145)

## 2021-01-20 LAB — BRAIN NATRIURETIC PEPTIDE: B Natriuretic Peptide: 40 pg/mL (ref 0.0–100.0)

## 2021-01-20 NOTE — Patient Instructions (Addendum)
Annual exam with mD 11/17 or after , call if you need me sooner  Please schedule mammogram at checkout  Fasting lipid, chem 7 and EGFr and HBa1C and vit D first week in Novemebr Cut back on sweets and white foods to improve blood sugar   Blood pressure and sodium are both good  You are referred to Dr Aline Brochure re knee pain  Careful not to fall  Thanks for choosing [facility], we consider it a privelige to serve you.

## 2021-01-20 NOTE — Assessment & Plan Note (Signed)
Recent flare of bilateral knee pain, right worse than left refer Ortho

## 2021-01-26 ENCOUNTER — Encounter: Payer: Self-pay | Admitting: Family Medicine

## 2021-01-26 NOTE — Assessment & Plan Note (Signed)
Controlled no current flare

## 2021-01-26 NOTE — Assessment & Plan Note (Signed)
Controlled, no change in medication DASH diet and commitment to daily physical activity for a minimum of 30 minutes discussed and encouraged, as a part of hypertension management. The importance of attaining a healthy weight is also discussed.  BP/Weight 01/20/2021 12/05/2020 12/03/2020 10/20/2020 09/25/2020 09/03/2020 31/28/1188  Systolic BP 677 373 [phone] 518  Diastolic BP 70 70 83 91 85 87 93  Wt. (Lbs) 148 151 150 156.13 160 159 162  BMI 25.4 25.92 25.75 27.66 27.46 27.29 27.38

## 2021-01-26 NOTE — Progress Notes (Signed)
Kristen Cox     MRN: [id number]      DOB: 27-Apr-1941   HPI Kristen Cox is here for follow up and re-evaluation of chronic medical conditions, medication management and review of any available recent lab and radiology data.  Preventive health is updated, specifically  Cancer screening and Immunization.   Questions or concerns regarding consultations or procedures which the PT has had in the interim are  addressed. The PT denies any adverse reactions to current medications since the last visit.  C/o incrased knee pain , right more than left , with some instability, wants Ortho eval, has benefited from intra aricular injections in the past   ROS Denies recent fever or chills. Denies sinus pressure, nasal congestion, ear pain or sore throat. Denies chest congestion, productive cough or wheezing. Denies chest pains, palpitations and leg swelling Denies abdominal pain, nausea, vomiting,diarrhea or constipation.   Denies dysuria, frequency, hesitancy or incontinence.  Denies headaches, seizures, numbness, or tingling. Denies depression, anxiety or insomnia. Denies skin break down or rash.   PE  BP 124/70 (BP Location: Right Arm, Patient Position: Sitting, Cuff Size: Normal)   Pulse 65   Temp 97.7 F (36.5 C) (Temporal)   Ht 5\' 4"  (1.626 m)   Wt 148 lb (67.1 kg)   SpO2 100%   BMI 25.40 kg/m   Patient alert and oriented and in no cardiopulmonary distress.  HEENT: No facial asymmetry, EOMI,     Neck supple .  Chest: Clear to auscultation bilaterally.  CVS: S1, S2 no murmurs, no S3.Regular rate.  ABD: Soft non tender.   Ext: No edema  MS: Decreased  ROM spine, shoulders, hips and knees.  Skin: Intact, no ulcerations or rash noted.  Psych: Good eye contact, normal affect. Memory intact not anxious or depressed appearing.  CNS: CN 2-12 intact, power,  normal throughout.no focal deficits noted.   Assessment & Plan  Arthritis of both knees Recent flare of bilateral  knee pain, right worse than left refer Ortho  Essential hypertension Controlled, no change in medication DASH diet and commitment to daily physical activity for a minimum of 30 minutes discussed and encouraged, as a part of hypertension management. The importance of attaining a healthy weight is also discussed.  BP/Weight 01/20/2021 12/05/2020 12/03/2020 10/20/2020 09/25/2020 09/03/2020 03/47/4259  Systolic BP 563 875 643 329 518 841 660  Diastolic BP 70 70 83 91 85 87 93  Wt. (Lbs) 148 151 150 156.13 160 159 162  BMI 25.4 25.92 25.75 27.66 27.46 27.29 27.38       Hyperlipemia Hyperlipidemia:Low fat diet discussed and encouraged.   Lipid Panel  Lab Results  Component Value Date   CHOL 171 11/25/2020   HDL 41 11/25/2020   LDLCALC 107 (H) 11/25/2020   TRIG 128 11/25/2020   CHOLHDL 5.0 (H) 08/27/2020   nees to reduce fat in diet    Prediabetes Patient educated about the importance of limiting  Carbohydrate intake , the need to commit to daily physical activity for a minimum of 30 minutes , and to commit weight loss. The fact that changes in all these areas will reduce or eliminate all together the development of diabetes is stressed.   Diabetic Labs Latest Ref Rng & Units 01/20/2021 12/26/2020 12/11/2020 12/05/2020 11/25/2020  HbA1c 4.8 - 5.6 % - - - - 5.9(H)  Chol 100 - 199 mg/dL - - - - 171  HDL >39 mg/dL - - - - 41  Calc LDL 0 -  99 mg/dL - - - - 107(H)  Triglycerides 0 - 149 mg/dL - - - - 128  Creatinine 0.44 - 1.00 mg/dL 0.80 0.72 1.15(H) 1.15(H) 0.93   BP/Weight 01/20/2021 12/05/2020 12/03/2020 10/20/2020 09/25/2020 09/03/2020 59/93/5701  Systolic BP 779 390 300 923 300 762 263  Diastolic BP 70 70 83 91 85 87 93  Wt. (Lbs) 148 151 150 156.13 160 159 162  BMI 25.4 25.92 25.75 27.66 27.46 27.29 27.38   No flowsheet data found.  Updated lab needed at/ before next visit.   Allergic rhinitis Controlled no current flare

## 2021-01-26 NOTE — Assessment & Plan Note (Signed)
Hyperlipidemia:Low fat diet discussed and encouraged.   Lipid Panel  Lab Results  Component Value Date   CHOL 171 11/25/2020   HDL 41 11/25/2020   LDLCALC 107 (H) 11/25/2020   TRIG 128 11/25/2020   CHOLHDL 5.0 (H) 08/27/2020   nees to reduce fat in diet

## 2021-01-26 NOTE — Assessment & Plan Note (Signed)
Patient educated about the importance of limiting  Carbohydrate intake , the need to commit to daily physical activity for a minimum of 30 minutes , and to commit weight loss. The fact that changes in all these areas will reduce or eliminate all together the development of diabetes is stressed.   Diabetic Labs Latest Ref Rng & Units 01/20/2021 12/26/2020 12/11/2020 12/05/2020 11/25/2020  HbA1c 4.8 - 5.6 % - - - - 5.9(H)  Chol 100 - 199 mg/dL - - - - 171  HDL >39 mg/dL - - - - 41  Calc LDL 0 - 99 mg/dL - - - - 107(H)  Triglycerides 0 - 149 mg/dL - - - - 128  Creatinine 0.44 - 1.00 mg/dL 0.80 0.72 1.15(H) 1.15(H) 0.93   BP/Weight 01/20/2021 12/05/2020 12/03/2020 10/20/2020 09/25/2020 09/03/2020 68/07/7516  Systolic BP 001 749 449 675 916 384 665  Diastolic BP 70 70 83 91 85 87 93  Wt. (Lbs) 148 151 150 156.13 160 159 162  BMI 25.4 25.92 25.75 27.66 27.46 27.29 27.38   No flowsheet data found.  Updated lab needed at/ before next visit.

## 2021-02-01 ENCOUNTER — Other Ambulatory Visit: Payer: Self-pay | Admitting: Family Medicine

## 2021-02-02 ENCOUNTER — Encounter: Payer: Self-pay | Admitting: Orthopedic Surgery

## 2021-02-02 ENCOUNTER — Other Ambulatory Visit: Payer: Self-pay

## 2021-02-02 ENCOUNTER — Ambulatory Visit: Payer: PPO | Admitting: Orthopedic Surgery

## 2021-02-02 VITALS — BP 119/70 | HR 76 | Ht 64.0 in | Wt 149.0 lb

## 2021-02-02 DIAGNOSIS — M5416 Radiculopathy, lumbar region: Secondary | ICD-10-CM

## 2021-02-02 DIAGNOSIS — M170 Bilateral primary osteoarthritis of knee: Secondary | ICD-10-CM

## 2021-02-02 MED ORDER — MELOXICAM 7.5 MG PO TABS: 7.5000 mg | ORAL_TABLET | Freq: Every day | ORAL | 5 refills | Status: AC

## 2021-02-02 MED ORDER — GABAPENTIN 100 MG PO CAPS: ORAL_CAPSULE | ORAL | 4 refills | Status: DC

## 2021-02-02 NOTE — Progress Notes (Signed)
Follow-up Encounter Diagnoses  Name Primary?   Radiculopathy, lumbar region Yes   Arthritis of both knees    Chief Complaint  Patient presents with   Osteoarthritis    Bilateral knees    History Kristen Cox is 33 we have seen her for her back and radicular leg pain as well as bilateral knee pain  She says she stopped taking her meloxicam because she was on too many medications she does take the gabapentin 100 mg at night  She complains primarily night pain in both legs sometimes radiates to the left foot.  She has bilateral knee pain on occasion.  She has trouble getting out of a chair but walks without much discomfort  Orthopedic focused exam to the left leg and knee  No effusion mild crepitance on range of motion she has a slight flexion contracture but bends knee well past 110 degrees the knee is otherwise stable with palpable joint line pain and tenderness  On the opposite right knee we find a much better knee there is no effusion there is a slight contracture in flexion but her flexion arc is 120 degrees the knee is stable there is no pain tenderness or swelling  I think we can start her meloxicam again 1 daily and then gabapentin 200 mg at night to address her radicular symptoms.  2 months recheck  to see if anything else needs to be done

## 2021-02-22 ENCOUNTER — Other Ambulatory Visit: Payer: Self-pay | Admitting: Family Medicine

## 2021-02-25 ENCOUNTER — Ambulatory Visit (HOSPITAL_COMMUNITY): Payer: PPO

## 2021-02-27 ENCOUNTER — Other Ambulatory Visit: Payer: Self-pay

## 2021-02-27 ENCOUNTER — Ambulatory Visit (HOSPITAL_COMMUNITY)
Admission: RE | Admit: 2021-02-27 | Discharge: 2021-02-27 | Disposition: A | Discharge status: Home / Self Care | Payer: PPO | Source: Ambulatory Visit | Attending: Family Medicine | Admitting: Family Medicine

## 2021-02-27 DIAGNOSIS — Z1231 Encounter for screening mammogram for malignant neoplasm of breast: Secondary | ICD-10-CM | POA: Insufficient documentation

## 2021-03-02 ENCOUNTER — Other Ambulatory Visit: Payer: Self-pay | Admitting: Radiology

## 2021-03-02 ENCOUNTER — Telehealth: Payer: Self-pay

## 2021-03-02 ENCOUNTER — Other Ambulatory Visit: Payer: Self-pay

## 2021-03-02 DIAGNOSIS — E7800 Pure hypercholesterolemia, unspecified: Secondary | ICD-10-CM

## 2021-03-02 DIAGNOSIS — M5416 Radiculopathy, lumbar region: Secondary | ICD-10-CM

## 2021-03-02 MED ORDER — ATORVASTATIN CALCIUM 40 MG PO TABS: 40.0000 mg | ORAL_TABLET | Freq: Every day | ORAL | 3 refills | Status: DC

## 2021-03-02 NOTE — Telephone Encounter (Signed)
Dr Aline Brochure did send in script for 2 qhs but only sent in 30 tabs. Ok to resend for 63? Please advise

## 2021-03-02 NOTE — Telephone Encounter (Signed)
Dr Aline Brochure advised pt that she should take 2 pills of the gabapentin, so she is out needs a refill on this

## 2021-03-02 NOTE — Telephone Encounter (Signed)
Message sent to orthopedics to update rx

## 2021-03-02 NOTE — Telephone Encounter (Signed)
Can you resend the Gabapentin for a full month supply?  pended

## 2021-03-03 MED ORDER — GABAPENTIN 100 MG PO CAPS: ORAL_CAPSULE | ORAL | 2 refills | Status: DC

## 2021-03-16 DIAGNOSIS — E559 Vitamin D deficiency, unspecified: Secondary | ICD-10-CM | POA: Diagnosis not present

## 2021-03-16 DIAGNOSIS — E213 Hyperparathyroidism, unspecified: Secondary | ICD-10-CM | POA: Diagnosis not present

## 2021-03-17 LAB — PTH, INTACT AND CALCIUM
Calcium: 10.3 mg/dL (ref 8.7–10.3)
PTH: 33 pg/mL (ref 15–65)

## 2021-03-17 LAB — VITAMIN D 25 HYDROXY (VIT D DEFICIENCY, FRACTURES): Vit D, 25-Hydroxy: 77.6 ng/mL (ref 30.0–100.0)

## 2021-03-24 NOTE — Patient Instructions (Signed)
Hypercalcemia Hypercalcemia is when the level of calcium in a person's blood is above normal. The body needs calcium to make bones and keep them strong. Calcium also helpsthe muscles, nerves, brain, and heart work the way they should. Most of the calcium in the body is in the bones. There is also some calcium in the blood. Hypercalcemia can happen when calcium comes out of the bones, or when the kidneys are not able to remove calcium from the blood. Hypercalcemiacan be mild or severe. What are the causes? There are many possible causes of hypercalcemia. Common causes of this condition include: Hyperparathyroidism. This is a condition in which the body produces too much parathyroid hormone. There are four parathyroid glands in your neck. These glands produce a chemical messenger (hormone) that helps the body absorb calcium from foods and helps your bones release calcium. Certain kinds of cancer. Less common causes of hypercalcemia include: Getting too much calcium or vitamin D from your diet. Kidney failure. Hyperthyroidism. Severe dehydration. Being on bed rest or being inactive for a long time. Certain medicines. Infections. What increases the risk? You are more likely to develop this condition if you: Are female. Are 60 years of age or older. Have a family history of hypercalcemia. What are the signs or symptoms? Mild hypercalcemia that starts slowly may not cause symptoms. Severe, sudden hypercalcemia is more likely to cause symptoms, such as: Being more thirsty than usual. Needing to urinate more often than usual. Abdominal pain. Nausea and vomiting. Constipation. Muscle pain, twitching, or weakness. Feeling very tired. How is this diagnosed?  Hypercalcemia is usually diagnosed with a blood test. You may also have tests to help determine what is causing this condition, such as imaging tests andmore blood tests. How is this treated? Treatment for hypercalcemia depends on the  cause. Treatment may include: Receiving fluids through an IV. Medicines that: Keep calcium levels steady after receiving fluids (loop diuretics). Keep calcium in your bones (bisphosphonates). Lower the calcium level in your blood. Surgery to remove overactive parathyroid glands. A procedure that filters your blood to correct calcium levels (hemodialysis). Follow these instructions at home:  Take over-the-counter and prescription medicines only as told by your health care provider. Follow instructions from your health care provider about eating or drinking restrictions. Drink enough fluid to keep your urine pale yellow. Stay active. Weight-bearing exercise helps to keep calcium in your bones. Follow instructions from your health care provider about what type and level of exercise is safe for you. Keep all follow-up visits as told by your health care provider. This is important. Contact a health care provider if you have: A fever. A heartbeat that is irregular or very fast. Changes in mood, memory, or personality. Get help right away if you: Have severe abdominal pain. Have chest pain. Have trouble breathing. Become very confused and sleepy. Lose consciousness. Summary Hypercalcemia is when the level of calcium in a person's blood is above normal. The body needs calcium to make bones and keep them strong. Calcium also helps the muscles, nerves, brain, and heart work the way they should. There are many possible causes of hypercalcemia, and treatment depends on the cause. Take over-the-counter and prescription medicines only as told by your health care provider. Follow instructions from your health care provider about eating or drinking restrictions. This information is not intended to replace advice given to you by your health care provider. Make sure you discuss any questions you have with your healthcare provider. Document Revised: 09/05/2018 Document   Reviewed: 05/15/2018 [employer]  Patient Education  2022 [employer]  

## 2021-03-25 ENCOUNTER — Encounter: Payer: Self-pay | Admitting: Nurse Practitioner

## 2021-03-25 ENCOUNTER — Ambulatory Visit: Payer: PPO | Admitting: Nurse Practitioner

## 2021-03-25 ENCOUNTER — Other Ambulatory Visit: Payer: Self-pay

## 2021-03-25 VITALS — BP 139/74 | HR 81 | Ht 64.0 in | Wt 145.6 lb

## 2021-03-25 DIAGNOSIS — E213 Hyperparathyroidism, unspecified: Secondary | ICD-10-CM

## 2021-03-25 DIAGNOSIS — E559 Vitamin D deficiency, unspecified: Secondary | ICD-10-CM | POA: Diagnosis not present

## 2021-03-25 NOTE — Progress Notes (Signed)
03/25/2021                 Endocrinology follow-up note   Subjective:    Patient ID: Kristen Cox, female    DOB: 11-28-1940, PCP Fayrene Helper, MD   Past Medical History:  Diagnosis Date   Deep vein thrombosis (DVT) ([place]) 08/2016   Right leg   Dry eyes 2010   on restasis - Dr. Jorja Loa    Gout 2009   Hyperlipidemia    Hypertension    Spinal stenosis    Past Surgical History:  Procedure Laterality Date   CESAREAN SECTION     COLONOSCOPY  04/09/2009   [id number] papilla otherwise, normal rectum/Left-sided diverticula/Cecal polyp status post cold snare and removal/The remainder of the colonic mucosa appeared normal. Adenomatous polyps, next TCS 03/2016 per RMR.   COLONOSCOPY N/A 05/19/2016   Procedure: COLONOSCOPY;  Surgeon: Daneil Dolin, MD;  Location: [facility];  Service: Endoscopy;  Laterality: N/A;  1:00 PM   Social History   Socioeconomic History   Marital status: Widowed    Spouse name: Not on file   Number of children: 2   Years of education: Not on file   Highest education level: 10th grade  Occupational History   Occupation: retired   Tobacco Use   Smoking status: Former    Packs/day: 0.50    Years: 10.00    Pack years: 5.00    Types: Cigarettes    Quit date: 10/07/1983    Years since quitting: 37.4   Smokeless tobacco: Never  Vaping Use   Vaping Use: Never used  Substance and Sexual Activity   Alcohol use: No   Drug use: No   Sexual activity: Never  Other Topics Concern   Not on file  Social History Narrative   Not on file   Social Determinants of Health   Financial Resource Strain: Low Risk    Difficulty of Paying Living Expenses: Not very hard  Food Insecurity: No Food Insecurity   Worried About Charity fundraiser in the Last Year: Never true   [place] in the Last Year: Never true  Transportation Needs: No Transportation Needs   Lack of Transportation (Medical): No   Lack of Transportation (Non-Medical): No  Physical  Activity: Insufficiently Active   Days of Exercise per Week: 2 days   Minutes of Exercise per Session: 20 min  Stress: No Stress Concern Present   Feeling of Stress : Not at all  Social Connections: Socially Isolated   Frequency of Communication with Friends and Family: Twice a week   Frequency of Social Gatherings with Friends and Family: Never   Attends Religious Services: Never   Marine scientist or Organizations: No   Attends Archivist Meetings: Never   Marital Status: Widowed   Outpatient Encounter Medications as of 03/25/2021  Medication Sig   amLODipine (NORVASC) 10 MG tablet TAKE 1 TABLET(10 MG) BY MOUTH DAILY   aspirin EC 81 MG tablet Take 81 mg by mouth daily.   atorvastatin (LIPITOR) 40 MG tablet Take 1 tablet (40 mg total) by mouth daily.   carvedilol (COREG) 6.25 MG tablet Take 1 tablet (6.25 mg total) by mouth 2 (two) times daily with a meal.   cholecalciferol (VITAMIN D3) 25 MCG (1000 UNIT) tablet Take 1,000 Units by mouth daily.   Flaxseed, Linseed, (FLAXSEED OIL) 1000 MG CAPS Take 1 capsule by mouth daily.   gabapentin (NEURONTIN) 100 MG capsule  2 at night   loratadine (CLARITIN) 10 MG tablet Take 1 tablet (10 mg total) by mouth daily.   methocarbamol (ROBAXIN) 500 MG tablet Take 500 mg by mouth 3 (three) times daily as needed for muscle spasms.   Polyethyl Glycol-Propyl Glycol 0.4-0.3 % SOLN Apply 1 drop to eye 2 (two) times daily.   spironolactone (ALDACTONE) 100 MG tablet Take 1 tablet (100 mg total) by mouth daily.   No facility-administered encounter medications on file as of 03/25/2021.   ALLERGIES: Allergies  Allergen Reactions   Benazepril Other (See Comments)    Patient does not recall being on this medication and/or what reaction she might have had.   Metoprolol     CRAMPS   VACCINATION STATUS: Immunization History  Administered Date(s) Administered   Fluad Quad(high Dose 65+) 06/06/2019, 07/08/2020, 07/28/2020   Influenza,inj,Quad  PF,6+ Mos 06/12/2014, 08/21/2015, 07/08/2016, 06/23/2017, 04/18/2018   Pneumococcal Conjugate-13 11/13/2014   Pneumococcal Polysaccharide-23 02/17/2016   Tdap 02/01/2011    HPI  80 year old female with medical history as above. She is being seen in follow-up for hypercalcemia related to mild hyperparathyroidism.  She is currently being observed only.  She has not required any surgical intervention nor pharmaceutical intervention at this time.    -Her previsit labs show serum calcium of 10.3 again, improved from 11.1 before last visit.  Her PTH remains stable. -She is currently taking vitamin D supplement 5000 units of vitamin D3 daily and recent Vitamin d level was 77.6.  -She denies any new complaints.   She denies any family history of parathyroid dysfunction.  She denies any hx of CVA, seizures, nephrolithiasis, nor CKD. she denies Osteoporosis. she is a former smoker.  She has not had a bone density test since 2015.   She denies any new complaints.   Review of systems  Constitutional: + Minimally fluctuating body weight,  current Body mass index is 24.99 kg/m. , no fatigue, no subjective hyperthermia, no subjective hypothermia Eyes: no blurry vision, no xerophthalmia ENT: no sore throat, no nodules palpated in throat, no dysphagia/odynophagia, no hoarseness Cardiovascular: no chest pain, no shortness of breath, no palpitations, no leg swelling Respiratory: no cough, no shortness of breath Gastrointestinal: no nausea/vomiting/diarrhea Musculoskeletal: no muscle/joint aches Skin: no rashes, no hyperemia Neurological: no tremors, no numbness, no tingling, no dizziness Psychiatric: no depression, no anxiety   Objective:    Ht '5\' 4"'$  (1.626 m)   Wt 145 lb 9.6 oz (66 kg)   BMI 24.99 kg/m   Wt Readings from Last 3 Encounters:  03/25/21 145 lb 9.6 oz (66 kg)  02/02/21 149 lb (67.6 kg)  01/20/21 148 lb (67.1 kg)    BP Readings from Last 3 Encounters:  02/02/21 119/70   01/20/21 124/70  12/05/20 126/70     Physical Exam- Limited  Constitutional:  Body mass index is 24.99 kg/m. , not in acute distress, normal state of mind Eyes:  EOMI, no exophthalmos Neck: Supple Cardiovascular: RRR, no murmurs, rubs, or gallops, no edema Respiratory: Adequate breathing efforts, no crackles, rales, rhonchi, or wheezing Musculoskeletal: no gross deformities, strength intact in all four extremities, no gross restriction of joint movements Skin:  no rashes, no hyperemia Neurological: no tremor with outstretched hands   Diabetic Labs (most recent): Lab Results  Component Value Date   HGBA1C 5.9 (H) 11/25/2020   HGBA1C 5.6 07/01/2016   HGBA1C 6.1 (H) 10/30/2015    Lipid Panel     Component Value Date/Time   CHOL 171  11/25/2020 0915   TRIG 128 11/25/2020 0915   HDL 41 11/25/2020 0915   CHOLHDL 5.0 (H) 08/27/2020 0952   CHOLHDL 4.4 06/06/2019 1422   VLDL 19 02/11/2016 0718   LDLCALC 107 (H) 11/25/2020 0915   LDLCALC 139 (H) 06/06/2019 1422   Recent Results (from the past 2160 hour(s))  CBC     Status: Abnormal   Collection Time: 12/26/20  8:27 AM  Result Value Ref Range   WBC 5.4 4.0 - 10.5 K/uL   RBC 3.57 (L) 3.87 - 5.11 MIL/uL   Hemoglobin 11.5 (L) 12.0 - 15.0 g/dL   HCT 33.3 (L) 36.0 - 46.0 %   MCV 93.3 80.0 - 100.0 fL   MCH 32.2 26.0 - 34.0 pg   MCHC 34.5 30.0 - 36.0 g/dL   RDW 13.1 11.5 - 15.5 %   Platelets 259 150 - 400 K/uL   nRBC 0.0 0.0 - 0.2 %    Comment: Performed at [facility], [address], [place], [place] [id number]  Basic metabolic panel     Status: Abnormal   Collection Time: 12/26/20  8:27 AM  Result Value Ref Range   Sodium 127 (L) 135 - 145 mmol/L   Potassium 3.9 3.5 - 5.1 mmol/L   Chloride 97 (L) 98 - 111 mmol/L   CO2 23 22 - 32 mmol/L   Glucose, Bld 119 (H) 70 - 99 mg/dL    Comment: Glucose reference range applies only to samples taken after fasting for at least 8 hours.   BUN 10 8 - 23 mg/dL   Creatinine, Ser 0.72  0.44 - 1.00 mg/dL   Calcium 9.5 8.9 - 10.3 mg/dL   GFR, Estimated >60 >60 mL/min    Comment: (NOTE) Calculated using the CKD-EPI Creatinine Equation (2021)    Anion gap 7 5 - 15    Comment: Performed at [facility], [address] [place], [place] [address]  TSH     Status: None   Collection Time: 12/26/20  8:28 AM  Result Value Ref Range   TSH 1.098 0.350 - 4.500 uIU/mL    Comment: Performed by a 3rd Generation assay with a functional sensitivity of <=0.01 uIU/mL. Performed at [facility], [address], [place], [place] [address]   Urinalysis, Routine w reflex microscopic Urine, Clean Catch     Status: Abnormal   Collection Time: 12/26/20  8:29 AM  Result Value Ref Range   Color, Urine STRAW (A) YELLOW   APPearance CLEAR CLEAR   Specific Gravity, Urine 1.003 (L) 1.005 - 1.030   pH 6.0 5.0 - 8.0   Glucose, UA NEGATIVE NEGATIVE mg/dL   Hgb urine dipstick SMALL (A) NEGATIVE   Bilirubin Urine NEGATIVE NEGATIVE   Ketones, ur NEGATIVE NEGATIVE mg/dL   Protein, ur NEGATIVE NEGATIVE mg/dL   Nitrite NEGATIVE NEGATIVE   Leukocytes,Ua NEGATIVE NEGATIVE   RBC / HPF 0-5 0 - 5 RBC/hpf   WBC, UA 0-5 0 - 5 WBC/hpf   Bacteria, UA NONE SEEN NONE SEEN    Comment: Performed at [facility], [address], [place], [place] [address]  B Nat Peptide     Status: None   Collection Time: 01/20/21  8:26 AM  Result Value Ref Range   B Natriuretic Peptide 40.0 0.0 - 100.0 pg/mL    Comment: Performed at [facility], [address] [place], [place] [id number]  Basic metabolic panel     Status: Abnormal   Collection Time: 01/20/21  8:26 AM  Result  Value Ref Range   Sodium 135 135 - 145 mmol/L   Potassium 3.6 3.5 - 5.1 mmol/L   Chloride 103 98 - 111 mmol/L   CO2 26 22 - 32 mmol/L   Glucose, Bld 109 (H) 70 - 99 mg/dL    Comment: Glucose reference range applies only to samples taken after fasting for at least 8 hours.   BUN 24 (H) 8 - 23 mg/dL   Creatinine, Ser 0.80 0.44 - 1.00 mg/dL   Calcium 10.0  8.9 - 10.3 mg/dL   GFR, Estimated >60 >60 mL/min    Comment: (NOTE) Calculated using the CKD-EPI Creatinine Equation (2021)    Anion gap 6 5 - 15    Comment: Performed at [facility], [address] [place], [place] [address]  VITAMIN D 25 Hydroxy (Vit-D Deficiency, Fractures)     Status: None   Collection Time: 03/16/21  8:24 AM  Result Value Ref Range   Vit D, 25-Hydroxy 77.6 30.0 - 100.0 ng/mL    Comment: Vitamin D deficiency has been defined by the [place] practice guideline as a level of serum 25-OH vitamin D less than 20 ng/mL (1,2). The Endocrine Society went on to further define vitamin D insufficiency as a level between 21 and 29 ng/mL (2). 1. IOM (Institute of Medicine). 2010. Dietary reference    intakes for calcium and D. [place]: The    [employer]. 2. Holick MF, Binkley [place], Bischoff-Ferrari HA, et al.    Evaluation, treatment, and prevention of vitamin D    deficiency: an Endocrine Society clinical practice    guideline. JCEM. 2011 Jul; 96(7):1911-30.   PTH, intact and calcium     Status: None   Collection Time: 03/16/21  8:24 AM  Result Value Ref Range   Calcium 10.3 8.7 - 10.3 mg/dL   PTH 33 15 - 65 pg/mL   PTH Interp Comment     Comment: Interpretation                 Intact PTH    Calcium                                 (pg/mL)      (mg/dL) Normal                          15 - 65     8.6 - 10.2 Primary Hyperparathyroidism         >65          >10.2 Secondary Hyperparathyroidism       >65          <10.2 Non-Parathyroid Hypercalcemia       <65          >10.2 Hypoparathyroidism                  <15          < 8.6 Non-Parathyroid Hypocalcemia    15 - 65          < 8.6    Results for SHALAINE, FRYMOYER (MRN [id number]) as of 03/24/2020 13:12  Ref. Range 11/09/2018 12:27 06/06/2019 14:22 06/12/2019 09:56 12/12/2019 09:29 03/10/2020 07:49  Calcium Latest Ref Range: 8.6 - 10.4 mg/dL 10.7 (H) 11.0 (H) 10.1 11.1 (H)  10.2   Results for MIEKO, HAMRE (MRN [id number]) as of 03/25/2021  09:43  Ref. Range 03/16/2021 08:24  Calcium Latest Ref Range: 8.7 - 10.3 mg/dL 10.3  Vitamin D, 25-Hydroxy Latest Ref Range: 30.0 - 100.0 ng/mL 77.6  PTH, Intact Latest Ref Range: 15 - 65 pg/mL 33  PTH Interp Unknown Comment    Assessment & Plan:   1. Hypercalcemia- mild primary hyperparathyroidism Her repeat labs have been reviewed with her.  Her PTH-rp was < 2.0 previously.  Her calcium level is stable, again at 10.3 with corresponding PTH of 33.  This is still consistent with mild primary hyperparathyroidism.    -She will not require any intervention at this time, neither surgical or pharmaceutical.  We will repeat PTH and calcium levels prior to next visit in 1 year.  - I discussed the potential complications of untreated hypercalcemia.  She is benefiting from continued vitamin D supplement, currently 5000 units of vitamin D3 daily.  Advised her to take maintenance dose of vitamin D3 5000 PO units daily.  -She was previously documented to have normal bone density.  She will be considered for Sensipar treatment if calcium levels become and remain elevated at subsequent visits.  - I advised patient to maintain close follow up with Fayrene Helper, MD for primary care needs.      I spent 20 minutes in the care of the patient today including review of labs from Thyroid Function, CMP, and other relevant labs ; imaging/biopsy records (current and previous including abstractions from other facilities); face-to-face time discussing  her lab results and symptoms, medications doses, her options of short and long term treatment based on the latest standards of care / guidelines;   and documenting the encounter.  Dorna Mai Scroggs  participated in the discussions, expressed understanding, and voiced agreement with the above plans.  All questions were answered to her satisfaction. she is encouraged to contact clinic should she have  any questions or concerns prior to her return visit.   Follow up plan: Return in about 1 year (around 03/25/2022) for hypercalcemia/hyperparathyroidism follow up, Previsit labs.  Rayetta Pigg, [facility] [address] [place],  [address] Phone: [phone] Fax: [phone]   03/25/2021, 9:47 AM

## 2021-04-06 ENCOUNTER — Encounter: Payer: Self-pay | Admitting: Orthopedic Surgery

## 2021-04-06 ENCOUNTER — Other Ambulatory Visit: Payer: Self-pay

## 2021-04-06 ENCOUNTER — Ambulatory Visit: Payer: PPO | Admitting: Orthopedic Surgery

## 2021-04-06 VITALS — BP 139/75 | HR 73 | Ht 64.0 in | Wt 147.6 lb

## 2021-04-06 DIAGNOSIS — M5416 Radiculopathy, lumbar region: Secondary | ICD-10-CM

## 2021-04-06 DIAGNOSIS — M170 Bilateral primary osteoarthritis of knee: Secondary | ICD-10-CM

## 2021-04-06 NOTE — Progress Notes (Signed)
Chief Complaint  Patient presents with   Knee Pain    Bilateral/follow up/pt states pain is some better. It isn't hurting as bad as it was. Pt states back isn't hurting today    80 year old female with chronic bilateral knee pain and lower back pain which is also chronic this is associated with intermittent radiculopathy  The patient says the medication gabapentin 100 mg twice daily has helped her although the pain is not completely resolved and she has pain when she is standing on her legs for long period of time or doing a lot of walking  Her right knee is shown to have 5-15 degree range of motion left as well however the left has medial joint line tenderness with an effusion  We discussed her medication use recommended she continue gabapentin 100 mg twice daily and change her meloxicam to 1 tablet daily  Encounter Diagnoses  Name Primary?   Radiculopathy, lumbar region Yes   Arthritis of both knees    (Chronic 2 OR more, prescription drug management)

## 2021-04-06 NOTE — Patient Instructions (Signed)
Continue gabapentin 2 at night   Meloxicam daily

## 2021-05-14 ENCOUNTER — Other Ambulatory Visit: Payer: Self-pay | Admitting: Family Medicine

## 2021-05-21 ENCOUNTER — Other Ambulatory Visit: Payer: Self-pay | Admitting: Family Medicine

## 2021-06-03 ENCOUNTER — Other Ambulatory Visit: Payer: Self-pay | Admitting: Family Medicine

## 2021-06-16 DIAGNOSIS — H25013 Cortical age-related cataract, bilateral: Secondary | ICD-10-CM | POA: Diagnosis not present

## 2021-06-16 DIAGNOSIS — H2513 Age-related nuclear cataract, bilateral: Secondary | ICD-10-CM | POA: Diagnosis not present

## 2021-06-16 DIAGNOSIS — H52203 Unspecified astigmatism, bilateral: Secondary | ICD-10-CM | POA: Diagnosis not present

## 2021-06-16 DIAGNOSIS — H47292 Other optic atrophy, left eye: Secondary | ICD-10-CM | POA: Diagnosis not present

## 2021-06-16 DIAGNOSIS — H31012 Macula scars of posterior pole (postinflammatory) (post-traumatic), left eye: Secondary | ICD-10-CM | POA: Diagnosis not present

## 2021-06-16 DIAGNOSIS — H524 Presbyopia: Secondary | ICD-10-CM | POA: Diagnosis not present

## 2021-06-16 DIAGNOSIS — H5213 Myopia, bilateral: Secondary | ICD-10-CM | POA: Diagnosis not present

## 2021-06-22 ENCOUNTER — Encounter (INDEPENDENT_AMBULATORY_CARE_PROVIDER_SITE_OTHER): Payer: Self-pay

## 2021-06-22 ENCOUNTER — Ambulatory Visit (INDEPENDENT_AMBULATORY_CARE_PROVIDER_SITE_OTHER): Payer: PPO

## 2021-06-22 ENCOUNTER — Other Ambulatory Visit: Payer: Self-pay

## 2021-06-22 VITALS — BP 159/82 | HR 86 | Temp 98.1°F | Ht 63.0 in | Wt 148.1 lb

## 2021-06-22 DIAGNOSIS — Z0000 Encounter for general adult medical examination without abnormal findings: Secondary | ICD-10-CM

## 2021-06-22 NOTE — Progress Notes (Signed)
Subjective:   Kristen Cox is a 80 y.o. female who presents for Medicare Annual (Subsequent) preventive examination.  Review of Systems           Objective:    There were no vitals filed for this visit. There is no height or weight on file to calculate BMI.  Advanced Directives 06/19/2020 12/20/2017 11/08/2017 02/14/2017 05/19/2016  Does Patient Have a Medical Advance Directive? No No No No No  Would patient like information on creating a medical advance directive? No - Patient declined Yes (MAU/Ambulatory/Procedural Areas - Information given) - Yes (MAU/Ambulatory/Procedural Areas - Information given) No - patient declined information    Current Medications (verified) Outpatient Encounter Medications as of 06/22/2021  Medication Sig   amLODipine (NORVASC) 10 MG tablet TAKE 1 TABLET(10 MG) BY MOUTH DAILY   aspirin EC 81 MG tablet Take 81 mg by mouth daily.   atorvastatin (LIPITOR) 40 MG tablet Take 1 tablet (40 mg total) by mouth daily.   carvedilol (COREG) 6.25 MG tablet Take 1 tablet (6.25 mg total) by mouth 2 (two) times daily with a meal.   cholecalciferol (VITAMIN D3) 25 MCG (1000 UNIT) tablet Take 1,000 Units by mouth daily.   Flaxseed, Linseed, (FLAXSEED OIL) 1000 MG CAPS Take 1 capsule by mouth daily.   gabapentin (NEURONTIN) 100 MG capsule 2 at night   loratadine (CLARITIN) 10 MG tablet Take 1 tablet (10 mg total) by mouth daily.   methocarbamol (ROBAXIN) 500 MG tablet Take 500 mg by mouth 3 (three) times daily as needed for muscle spasms.   Polyethyl Glycol-Propyl Glycol 0.4-0.3 % SOLN Apply 1 drop to eye 2 (two) times daily.   spironolactone (ALDACTONE) 100 MG tablet Take 1 tablet (100 mg total) by mouth daily.   No facility-administered encounter medications on file as of 06/22/2021.    Allergies (verified) Benazepril and Metoprolol   History: Past Medical History:  Diagnosis Date   Deep vein thrombosis (DVT) ([facility]) 08/2016   Right leg   Dry eyes 2010   on  restasis - Dr. Jorja Loa    Gout 2009   Hyperlipidemia    Hypertension    Spinal stenosis    Past Surgical History:  Procedure Laterality Date   CESAREAN SECTION     COLONOSCOPY  04/09/2009   AVW:PVXY papilla otherwise, normal rectum/Left-sided diverticula/Cecal polyp status post cold snare and removal/The remainder of the colonic mucosa appeared normal. Adenomatous polyps, next TCS 03/2016 per RMR.   COLONOSCOPY N/A 05/19/2016   Procedure: COLONOSCOPY;  Surgeon: Daneil Dolin, MD;  Location: [facility];  Service: Endoscopy;  Laterality: N/A;  1:00 PM   Family History  Problem Relation Age of Onset   Stroke Mother    Stroke Father    Diabetes Sister    Pulmonary embolism Sister    Cirrhosis Brother    Colon cancer Neg Hx    Social History   Socioeconomic History   Marital status: Widowed    Spouse name: Not on file   Number of children: 2   Years of education: Not on file   Highest education level: 10th grade  Occupational History   Occupation: retired   Tobacco Use   Smoking status: Former    Packs/day: 0.50    Years: 10.00    Pack years: 5.00    Types: Cigarettes    Quit date: 10/07/1983    Years since quitting: 37.7   Smokeless tobacco: Never  Vaping Use   Vaping Use: Never used  Substance and Sexual Activity   Alcohol use: No   Drug use: No   Sexual activity: Never  Other Topics Concern   Not on file  Social History Narrative   Not on file   Social Determinants of Health   Financial Resource Strain: Not on file  Food Insecurity: Not on file  Transportation Needs: Not on file  Physical Activity: Not on file  Stress: Not on file  Social Connections: Not on file    Tobacco Counseling Counseling given: Not Answered   Clinical Intake:                 Diabetic?No         Activities of Daily Living No flowsheet data found.  Patient Care Team: Fayrene Helper, MD as PCP - General Fay Records, MD as PCP - Cardiology  (Cardiology) Gala Romney Cristopher Estimable, MD as Consulting Physician (Gastroenterology) Cassandria Anger, MD as Consulting Physician (Endocrinology) Florian Buff, MD as Consulting Physician (Obstetrics and Gynecology) Sydnee Cabal, MD as Consulting Physician (Orthopedic Surgery)  Indicate any recent Medical Services you may have received from other than [facility] providers in the past year (date may be approximate).     Assessment:   This is a routine wellness examination for [facility].  Hearing/Vision screen No results found.  Dietary issues and exercise activities discussed:     Goals Addressed   None   Depression Screen PHQ 2/9 Scores 01/20/2021 12/03/2020 09/03/2020 07/08/2020 06/19/2020 06/19/2020 04/09/2020  PHQ - 2 Score 0 0 0 0 0 0 0  PHQ- 9 Score - - - - - - 0    Fall Risk Fall Risk  01/20/2021 12/03/2020 09/03/2020 07/08/2020 06/19/2020  Falls in the past year? 0 0 0 0 0  Number falls in past yr: 0 0 0 - 0  Injury with Fall? 0 0 0 - 0  Risk for fall due to : No Fall Risks No Fall Risks No Fall Risks - No Fall Risks  Risk for fall due to: Comment - - - - -  Follow up Falls evaluation completed Falls evaluation completed Falls evaluation completed - Falls evaluation completed    FALL RISK PREVENTION PERTAINING TO THE HOME:  Any stairs in or around the home? Yes  If so, are there any without handrails? Yes  Home free of loose throw rugs in walkways, pet beds, electrical cords, etc? No  Adequate lighting in your home to reduce risk of falls? Yes   ASSISTIVE DEVICES UTILIZED TO PREVENT FALLS:  Life alert? No  Use of a cane, walker or w/c? No  Grab bars in the bathroom? No  Shower chair or bench in shower? No  Elevated toilet seat or a handicapped toilet? No   TIMED UP AND GO:  Was the test performed? No .  Length of time to ambulate 10 feet: n/a sec.     Cognitive Function:     6CIT Screen 06/19/2020 06/19/2019 12/20/2017 02/14/2017  What Year? 0 points 0 points 0  points 0 points  What month? 0 points 0 points 0 points 0 points  What time? 0 points 0 points 0 points 0 points  Count back from 20 2 points 0 points 0 points 0 points  Months in reverse 0 points 0 points 0 points 0 points  Repeat phrase 10 points 2 points 0 points 0 points  Total Score 12 2 0 0    Immunizations Immunization History  Administered Date(s) Administered   Fluad  Quad(high Dose 65+) 06/06/2019, 07/08/2020, 07/28/2020   Influenza,inj,Quad PF,6+ Mos 06/12/2014, 08/21/2015, 07/08/2016, 06/23/2017, 04/18/2018   Pneumococcal Conjugate-13 11/13/2014   Pneumococcal Polysaccharide-23 02/17/2016   Tdap 02/01/2011    TDAP status: Due, Education has been provided regarding the importance of this vaccine. Advised may receive this vaccine at local pharmacy or Health Dept. Aware to provide a copy of the vaccination record if obtained from local pharmacy or Health Dept. Verbalized acceptance and understanding.  Flu Vaccine status: Due, Education has been provided regarding the importance of this vaccine. Advised may receive this vaccine at local pharmacy or Health Dept. Aware to provide a copy of the vaccination record if obtained from local pharmacy or Health Dept. Verbalized acceptance and understanding.  Pneumococcal vaccine status: Due, Education has been provided regarding the importance of this vaccine. Advised may receive this vaccine at local pharmacy or Health Dept. Aware to provide a copy of the vaccination record if obtained from local pharmacy or Health Dept. Verbalized acceptance and understanding.  Covid-19 vaccine status: Declined, Education has been provided regarding the importance of this vaccine but patient still declined. Advised may receive this vaccine at local pharmacy or Health Dept.or vaccine clinic. Aware to provide a copy of the vaccination record if obtained from local pharmacy or Health Dept. Verbalized acceptance and understanding.  Qualifies for Shingles  Vaccine? No   Zostavax completed No   Shingrix Completed?: No.    Education has been provided regarding the importance of this vaccine. Patient has been advised to call insurance company to determine out of pocket expense if they have not yet received this vaccine. Advised may also receive vaccine at local pharmacy or Health Dept. Verbalized acceptance and understanding.  Screening Tests Health Maintenance  Topic Date Due   COVID-19 Vaccine (1) Never done   Zoster Vaccines- Shingrix (1 of 2) Never done   TETANUS/TDAP  01/31/2021   INFLUENZA VACCINE  03/23/2021   Pneumonia Vaccine 46+ Years old  Completed   DEXA SCAN  Completed   HPV VACCINES  Aged Out    Health Maintenance  Health Maintenance Due  Topic Date Due   COVID-19 Vaccine (1) Never done   Zoster Vaccines- Shingrix (1 of 2) Never done   TETANUS/TDAP  01/31/2021   INFLUENZA VACCINE  03/23/2021    Colorectal cancer screening: No longer required.   Mammogram status: No longer required due to age.  Bone Density status: Completed 05/03/2018. Results reflect: Bone density results: NORMAL. Repeat every 5 years.  Lung Cancer Screening: (Low Dose CT Chest recommended if Age 47-80 years, 30 pack-year currently smoking OR have quit w/in 15years.) does not qualify.   Lung Cancer Screening Referral: n/a  Additional Screening:  Hepatitis C Screening: does qualify; Completed n/a  Vision Screening: Recommended annual ophthalmology exams for early detection of glaucoma and other disorders of the eye. Is the patient up to date with their annual eye exam?  Yes  Who is the provider or what is the name of the office in which the patient attends annual eye exams? [facility] If pt is not established with a provider, would they like to be referred to a provider to establish care? No .   Dental Screening: Recommended annual dental exams for proper oral hygiene  Community Resource Referral / Chronic Care Management: CRR required  this visit?  No   CCM required this visit?  No      Plan:     I have personally reviewed and noted the following  in the patient's chart:   Medical and social history Use of alcohol, tobacco or illicit drugs  Current medications and supplements including opioid prescriptions.  Functional ability and status Nutritional status Physical activity Advanced directives List of other physicians Hospitalizations, surgeries, and ER visits in previous 12 months Vitals Screenings to include cognitive, depression, and falls Referrals and appointments  In addition, I have reviewed and discussed with patient certain preventive protocols, quality metrics, and best practice recommendations. A written personalized care plan for preventive services as well as general preventive health recommendations were provided to patient.     Quentin Angst, [place]   06/22/2021   Nurse Notes: This is a in person visit with patient. The provider is Ihor Dow, MD

## 2021-06-22 NOTE — Patient Instructions (Signed)
Ms. Kristen Cox , Thank you for taking time to come for your Medicare Wellness Visit. I appreciate your ongoing commitment to your health goals. Please review the following plan we discussed and let me know if I can assist you in the future.   Screening recommendations/referrals: Colonoscopy: not due  Mammogram: not due  Bone Density: not due Recommended yearly ophthalmology/optometry visit for glaucoma screening and checkup Recommended yearly dental visit for hygiene and checkup  Vaccinations: Influenza vaccine: due now Pneumococcal vaccine: due now Tdap vaccine: due now  Shingles vaccine: due now     Advanced directives: declined   Conditions/risks identified: hypertension   Next appointment: 1 year    Preventive Care 42 Years and Older, Female Preventive care refers to lifestyle choices and visits with your health care provider that can promote health and wellness. What does preventive care include? A yearly physical exam. This is also called an annual well check. Dental exams once or twice a year. Routine eye exams. Ask your health care provider how often you should have your eyes checked. Personal lifestyle choices, including: Daily care of your teeth and gums. Regular physical activity. Eating a healthy diet. Avoiding tobacco and drug use. Limiting alcohol use. Practicing safe sex. Taking low-dose aspirin every day. Taking vitamin and mineral supplements as recommended by your health care provider. What happens during an annual well check? The services and screenings done by your health care provider during your annual well check will depend on your age, overall health, lifestyle risk factors, and family history of disease. Counseling  Your health care provider may ask you questions about your: Alcohol use. Tobacco use. Drug use. Emotional well-being. Home and relationship well-being. Sexual activity. Eating habits. History of falls. Memory and ability to understand  (cognition). Work and work Statistician. Reproductive health. Screening  You may have the following tests or measurements: Height, weight, and BMI. Blood pressure. Lipid and cholesterol levels. These may be checked every 5 years, or more frequently if you are over 34 years old. Skin check. Lung cancer screening. You may have this screening every year starting at age 74 if you have a 30-pack-year history of smoking and currently smoke or have quit within the past 15 years. Fecal occult blood test (FOBT) of the stool. You may have this test every year starting at age 57. Flexible sigmoidoscopy or colonoscopy. You may have a sigmoidoscopy every 5 years or a colonoscopy every 10 years starting at age 91. Hepatitis C blood test. Hepatitis B blood test. Sexually transmitted disease (STD) testing. Diabetes screening. This is done by checking your blood sugar (glucose) after you have not eaten for a while (fasting). You may have this done every 1-3 years. Bone density scan. This is done to screen for osteoporosis. You may have this done starting at age 63. Mammogram. This may be done every 1-2 years. Talk to your health care provider about how often you should have regular mammograms. Talk with your health care provider about your test results, treatment options, and if necessary, the need for more tests. Vaccines  Your health care provider may recommend certain vaccines, such as: Influenza vaccine. This is recommended every year. Tetanus, diphtheria, and acellular pertussis (Tdap, Td) vaccine. You may need a Td booster every 10 years. Zoster vaccine. You may need this after age 59. Pneumococcal 13-valent conjugate (PCV13) vaccine. One dose is recommended after age 26. Pneumococcal polysaccharide (PPSV23) vaccine. One dose is recommended after age 17. Talk to your health care provider about which  screenings and vaccines you need and how often you need them. This information is not intended to  replace advice given to you by your health care provider. Make sure you discuss any questions you have with your health care provider. Document Released: 09/05/2015 Document Revised: 04/28/2016 Document Reviewed: 06/10/2015 Elsevier Interactive Patient Education  2017 [place] Prevention in the Home Falls can cause injuries. They can happen to people of all ages. There are many things you can do to make your home safe and to help prevent falls. What can I do on the outside of my home? Regularly fix the edges of walkways and driveways and fix any cracks. Remove anything that might make you trip as you walk through a door, such as a raised step or threshold. Trim any bushes or trees on the path to your home. Use bright outdoor lighting. Clear any walking paths of anything that might make someone trip, such as rocks or tools. Regularly check to see if handrails are loose or broken. Make sure that both sides of any steps have handrails. Any raised decks and porches should have guardrails on the edges. Have any leaves, snow, or ice cleared regularly. Use sand or salt on walking paths during winter. Clean up any spills in your garage right away. This includes oil or grease spills. What can I do in the bathroom? Use night lights. Install grab bars by the toilet and in the tub and shower. Do not use towel bars as grab bars. Use non-skid mats or decals in the tub or shower. If you need to sit down in the shower, use a plastic, non-slip stool. Keep the floor dry. Clean up any water that spills on the floor as soon as it happens. Remove soap buildup in the tub or shower regularly. Attach bath mats securely with double-sided non-slip rug tape. Do not have throw rugs and other things on the floor that can make you trip. What can I do in the bedroom? Use night lights. Make sure that you have a light by your bed that is easy to reach. Do not use any sheets or blankets that are too big for  your bed. They should not hang down onto the floor. Have a firm chair that has side arms. You can use this for support while you get dressed. Do not have throw rugs and other things on the floor that can make you trip. What can I do in the kitchen? Clean up any spills right away. Avoid walking on wet floors. Keep items that you use a lot in easy-to-reach places. If you need to reach something above you, use a strong step stool that has a grab bar. Keep electrical cords out of the way. Do not use floor polish or wax that makes floors slippery. If you must use wax, use non-skid floor wax. Do not have throw rugs and other things on the floor that can make you trip. What can I do with my stairs? Do not leave any items on the stairs. Make sure that there are handrails on both sides of the stairs and use them. Fix handrails that are broken or loose. Make sure that handrails are as long as the stairways. Check any carpeting to make sure that it is firmly attached to the stairs. Fix any carpet that is loose or worn. Avoid having throw rugs at the top or bottom of the stairs. If you do have throw rugs, attach them to the floor with carpet  tape. Make sure that you have a light switch at the top of the stairs and the bottom of the stairs. If you do not have them, ask someone to add them for you. What else can I do to help prevent falls? Wear shoes that: Do not have high heels. Have rubber bottoms. Are comfortable and fit you well. Are closed at the toe. Do not wear sandals. If you use a stepladder: Make sure that it is fully opened. Do not climb a closed stepladder. Make sure that both sides of the stepladder are locked into place. Ask someone to hold it for you, if possible. Clearly mark and make sure that you can see: Any grab bars or handrails. First and last steps. Where the edge of each step is. Use tools that help you move around (mobility aids) if they are needed. These  include: Canes. Walkers. Scooters. Crutches. Turn on the lights when you go into a dark area. Replace any light bulbs as soon as they burn out. Set up your furniture so you have a clear path. Avoid moving your furniture around. If any of your floors are uneven, fix them. If there are any pets around you, be aware of where they are. Review your medicines with your doctor. Some medicines can make you feel dizzy. This can increase your chance of falling. Ask your doctor what other things that you can do to help prevent falls. This information is not intended to replace advice given to you by your health care provider. Make sure you discuss any questions you have with your health care provider. Document Released: 06/05/2009 Document Revised: 01/15/2016 Document Reviewed: 09/13/2014 Elsevier Interactive Patient Education  2017 [employer].

## 2021-07-04 DIAGNOSIS — M79672 Pain in left foot: Secondary | ICD-10-CM | POA: Diagnosis not present

## 2021-07-04 DIAGNOSIS — M1000 Idiopathic gout, unspecified site: Secondary | ICD-10-CM | POA: Diagnosis not present

## 2021-07-07 DIAGNOSIS — I10 Essential (primary) hypertension: Secondary | ICD-10-CM | POA: Diagnosis not present

## 2021-07-07 DIAGNOSIS — E559 Vitamin D deficiency, unspecified: Secondary | ICD-10-CM | POA: Diagnosis not present

## 2021-07-07 DIAGNOSIS — R7303 Prediabetes: Secondary | ICD-10-CM | POA: Diagnosis not present

## 2021-07-07 DIAGNOSIS — E7800 Pure hypercholesterolemia, unspecified: Secondary | ICD-10-CM | POA: Diagnosis not present

## 2021-07-08 LAB — LIPID PANEL
Chol/HDL Ratio: 3.8 ratio (ref 0.0–4.4)
Cholesterol, Total: 165 mg/dL (ref 100–199)
HDL: 44 mg/dL (ref >39)
LDL Chol Calc (NIH): 103 mg/dL — ABNORMAL HIGH (ref 0–99)
Triglycerides: 95 mg/dL (ref 0–149)
VLDL Cholesterol Cal: 18 mg/dL (ref 5–40)

## 2021-07-08 LAB — BASIC METABOLIC PANEL
BUN/Creatinine Ratio: 34 — ABNORMAL HIGH (ref 12–28)
BUN: 30 mg/dL — ABNORMAL HIGH (ref 8–27)
CO2: 23 mmol/L (ref 20–29)
Calcium: 10.5 mg/dL — ABNORMAL HIGH (ref 8.7–10.3)
Chloride: 106 mmol/L (ref 96–106)
Creatinine, Ser: 0.87 mg/dL (ref 0.57–1.00)
Glucose: 87 mg/dL (ref 70–99)
Potassium: 4.5 mmol/L (ref 3.5–5.2)
Sodium: 141 mmol/L (ref 134–144)
eGFR: 67 mL/min/{1.73_m2} (ref >59)

## 2021-07-08 LAB — HEMOGLOBIN A1C
Est. average glucose Bld gHb Est-mCnc: 111 mg/dL
Hgb A1c MFr Bld: 5.5 % (ref 4.8–5.6)

## 2021-07-08 LAB — VITAMIN D 25 HYDROXY (VIT D DEFICIENCY, FRACTURES): Vit D, 25-Hydroxy: 65.4 ng/mL (ref 30.0–100.0)

## 2021-07-14 ENCOUNTER — Other Ambulatory Visit: Payer: Self-pay

## 2021-07-14 ENCOUNTER — Encounter: Payer: Self-pay | Admitting: Family Medicine

## 2021-07-14 ENCOUNTER — Ambulatory Visit (INDEPENDENT_AMBULATORY_CARE_PROVIDER_SITE_OTHER): Payer: PPO | Admitting: Family Medicine

## 2021-07-14 VITALS — BP 122/72 | HR 100 | Resp 16 | Ht 63.0 in | Wt 148.0 lb

## 2021-07-14 DIAGNOSIS — Z0000 Encounter for general adult medical examination without abnormal findings: Secondary | ICD-10-CM | POA: Diagnosis not present

## 2021-07-14 DIAGNOSIS — Z23 Encounter for immunization: Secondary | ICD-10-CM | POA: Diagnosis not present

## 2021-07-14 DIAGNOSIS — M67972 Unspecified disorder of synovium and tendon, left ankle and foot: Secondary | ICD-10-CM

## 2021-07-14 DIAGNOSIS — M67979 Unspecified disorder of synovium and tendon, unspecified ankle and foot: Secondary | ICD-10-CM | POA: Insufficient documentation

## 2021-07-14 NOTE — Assessment & Plan Note (Signed)

## 2021-07-14 NOTE — Patient Instructions (Signed)
F/u in 5 months, call if you need me sooner  Flu vaccine today  You are referred urgently to Dr Aline Brochure we will call with appt info  'Great bP and labs, keep up the good work  Thanks for choosing [facility], we consider it a privelige to serve you.

## 2021-07-15 ENCOUNTER — Ambulatory Visit: Payer: PPO | Admitting: Orthopedic Surgery

## 2021-07-15 ENCOUNTER — Ambulatory Visit: Payer: PPO

## 2021-07-15 ENCOUNTER — Encounter: Payer: Self-pay | Admitting: Orthopedic Surgery

## 2021-07-15 VITALS — BP 150/81 | HR 90 | Ht 63.0 in | Wt 147.6 lb

## 2021-07-15 DIAGNOSIS — M79672 Pain in left foot: Secondary | ICD-10-CM

## 2021-07-15 DIAGNOSIS — M7662 Achilles tendinitis, left leg: Secondary | ICD-10-CM

## 2021-07-15 MED ORDER — PREDNISONE 10 MG PO TABS: 10.0000 mg | ORAL_TABLET | Freq: Two times a day (BID) | ORAL | 0 refills | Status: AC

## 2021-07-15 NOTE — Progress Notes (Signed)
Urgent referral request by Dr. Moshe Cipro   Chief Complaint  Patient presents with   Foot Pain    LT Achilles pain Started 2 weeks ago back of leg and ankle started swelling    80 year old female 2-week history of pain in the back of her left ankle no trauma  No prior treatment  Review of systems no evidence or history of fever rash neurologic review of systems negative as well   Past Medical History:  Diagnosis Date   Deep vein thrombosis (DVT) ([place]) 08/2016   Right leg   Dry eyes 2010   on restasis - Dr. Jorja Loa    Gout 2009   Hyperlipidemia    Hypertension    Spinal stenosis    BP (!) 150/81   Pulse 90   Ht 5\' 3"  (1.6 m)   Wt 147 lb 9.6 oz (67 kg)   BMI 26.15 kg/m   General appearance is normal patient has a small frame body habitus is ectomorphic  Neurologic exam normal sensation no pathologic reflexes  Mental status awake and alert oriented x3 mood affect normal  Musculoskeletal exam tenderness is noted over the Achilles tendon with swelling the Achilles tendon is intact without nodularity ankle motion is normal with normal stability  X-rays show a plantar spur and a calcification in the Achilles tendon  Diagnosis Achilles tendinitis insertional  Recommend Epson salt soaks twice a day  Prednisone 10 mg twice a day for 10 days  Follow-up if no improvement

## 2021-07-15 NOTE — Patient Instructions (Signed)
Soak foot epsom salts twice a day for 20 minutes

## 2021-07-20 ENCOUNTER — Encounter: Payer: Self-pay | Admitting: Family Medicine

## 2021-07-20 NOTE — Assessment & Plan Note (Signed)
Acute pain and swelling of left tendon , no response to oral steroids, refer Ortho asap

## 2021-07-20 NOTE — Progress Notes (Signed)
    Kristen Cox     MRN: [id number]      DOB: 07-19-1941  HPI: Patient is in for annual physical exam. C/o tenderness, pain and swelling behind heel of left foot despite recent anti inflammatory course prescribed at [facility]. Recent labs,  are reviewed. Immunization is reviewed , and  updated .   PE: BP 122/72   Pulse 100   Resp 16   Ht 5\' 3"  (1.6 m)   Wt 148 lb (67.1 kg)   SpO2 95%   BMI 26.22 kg/m   Pleasant  female, alert and oriented x 3, in no cardio-pulmonary distress. Afebrile. HEENT No facial trauma or asymetry. Sinuses non tender.  Extra occullar muscles intact.. External ears normal, . Neck: supple, no adenopathy,JVD or thyromegaly.No bruits.  Chest: Clear to ascultation bilaterally.No crackles or wheezes. Non tender to palpation    Cardiovascular system; Heart sounds normal,  S1 and  S2 ,no S3.  No murmur, or thrill. Apical beat not displaced Peripheral pulses normal.  Abdomen: Soft, non tender, No guarding, tenderness or rebound.   .   Musculoskeletal exam: Decreased  ROM of spine, hips , shoulders and knees.Tender over left Achilles tendon deformity ,swelling or crepitus noted. No muscle wasting or atrophy.   Neurologic: Cranial nerves 2 to 12 intact. Power, tone ,sensation and reflexes normal throughout. No disturbance in gait. No tremor.  Skin: Intact, no ulceration, erythema , scaling or rash noted. Pigmentation normal throughout  Psych; Normal mood and affect. Judgement and concentration normal   Assessment & Plan:  Annual physical exam Annual exam as documented. Counseling done  re healthy lifestyle involving commitment to 150 minutes exercise per week, heart healthy diet, and attaining healthy weight.The importance of adequate sleep also discussed. Regular seat belt use and home safety, is also discussed. Changes in health habits are decided on by the patient with goals and time frames  set for achieving them. Immunization and  cancer screening needs are specifically addressed at this visit.   Achilles tendon disorder Acute pain and swelling of left tendon , no response to oral steroids, refer Ortho asap

## 2021-08-05 ENCOUNTER — Other Ambulatory Visit: Payer: Self-pay | Admitting: Orthopedic Surgery

## 2021-08-05 DIAGNOSIS — M5416 Radiculopathy, lumbar region: Secondary | ICD-10-CM

## 2021-08-20 IMAGING — MG MM DIGITAL SCREENING BILAT W/ TOMO AND CAD
8 series · 8 of 24 positions shown · non-contrast
Comparison: Previous exam(s).

CLINICAL DATA: Screening.

EXAM:
DIGITAL SCREENING BILATERAL MAMMOGRAM WITH TOMOSYNTHESIS AND CAD
TECHNIQUE: Bilateral screening digital craniocaudal and mediolateral oblique
mammograms were obtained. Bilateral screening digital breast
tomosynthesis was performed. The images were evaluated with
computer-aided detection.

[R CC synth-2D]
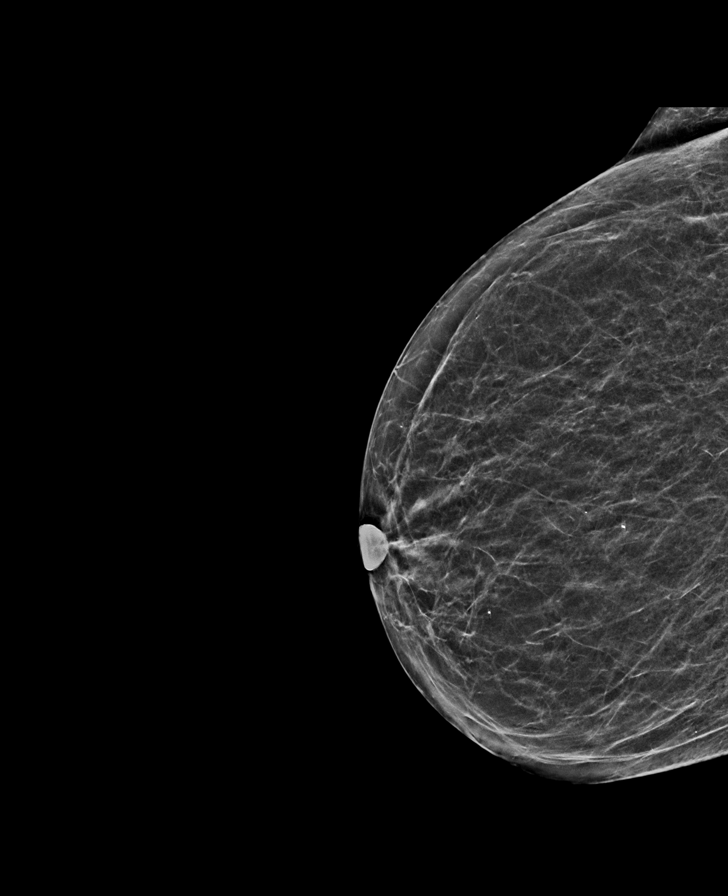

[L MLO synth-2D]
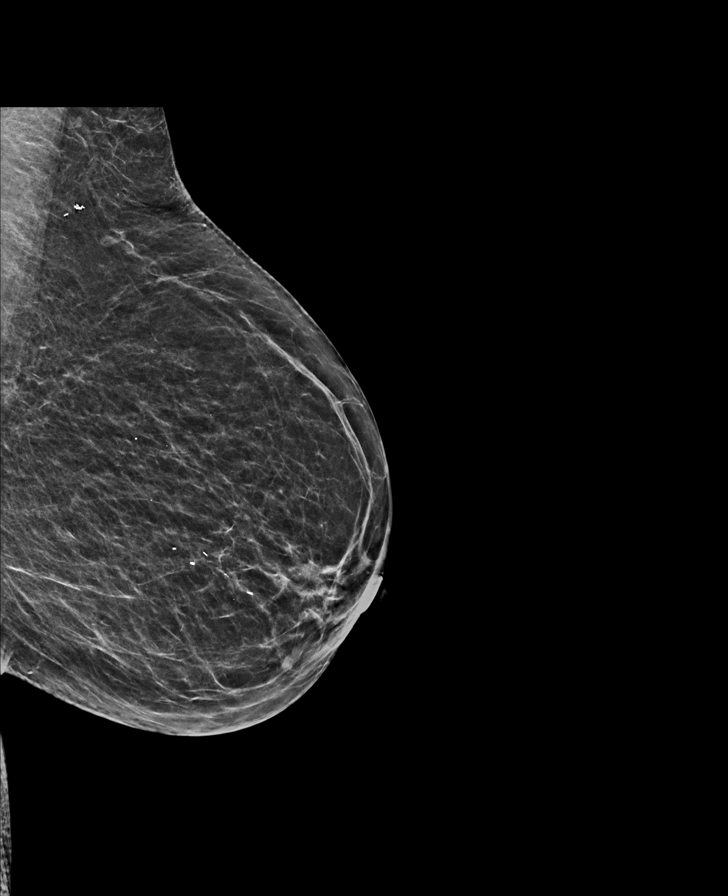

[R MLO synth-2D]
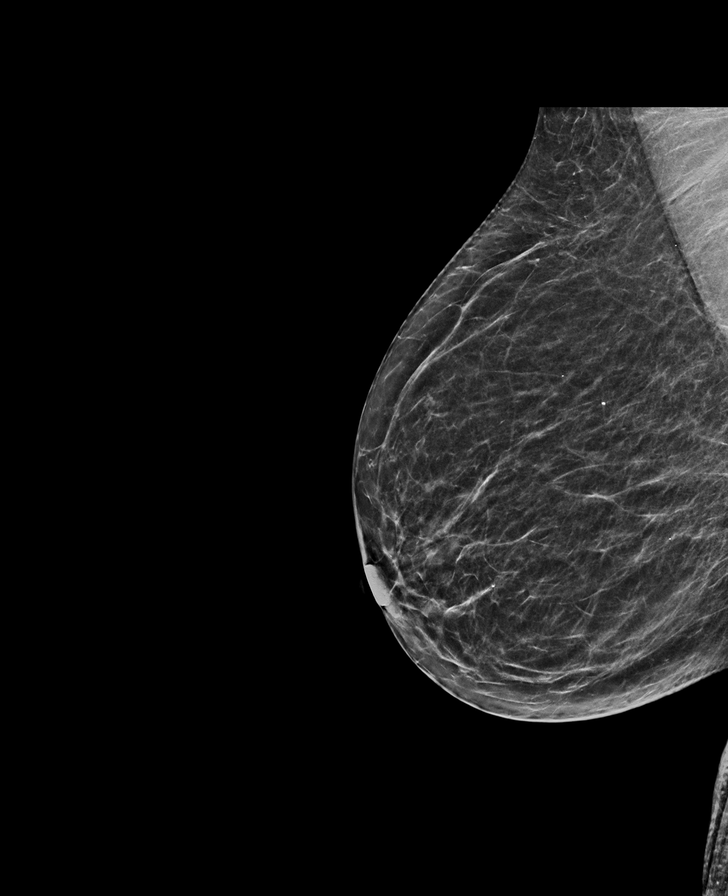

[L CC synth-2D]
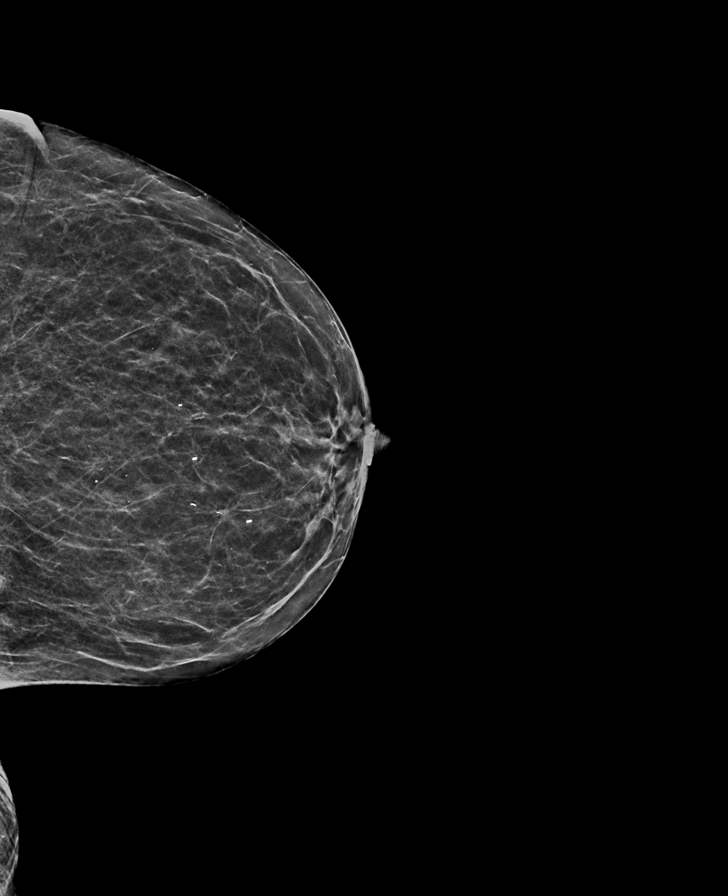

[L CC tomo · tomo slice 27/52.0]
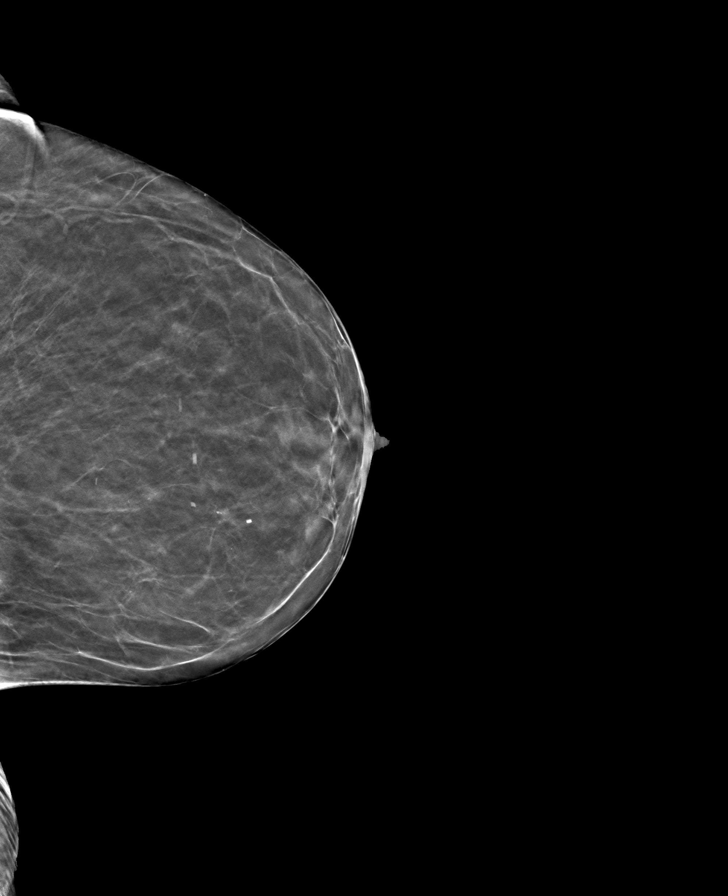

[R CC tomo · tomo slice 27/52.0]
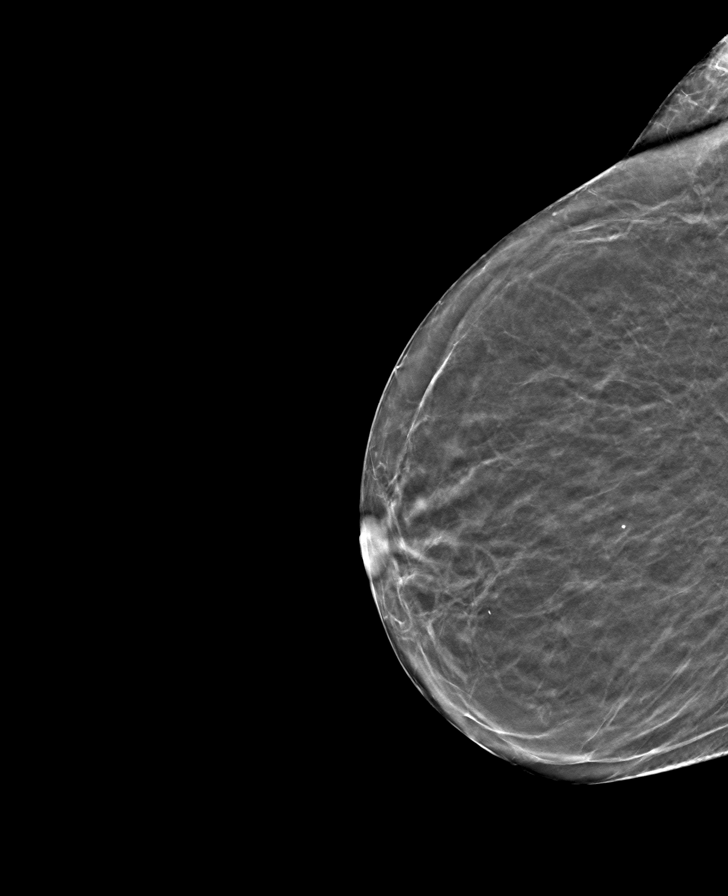

[R MLO tomo · tomo slice 30/59.0]
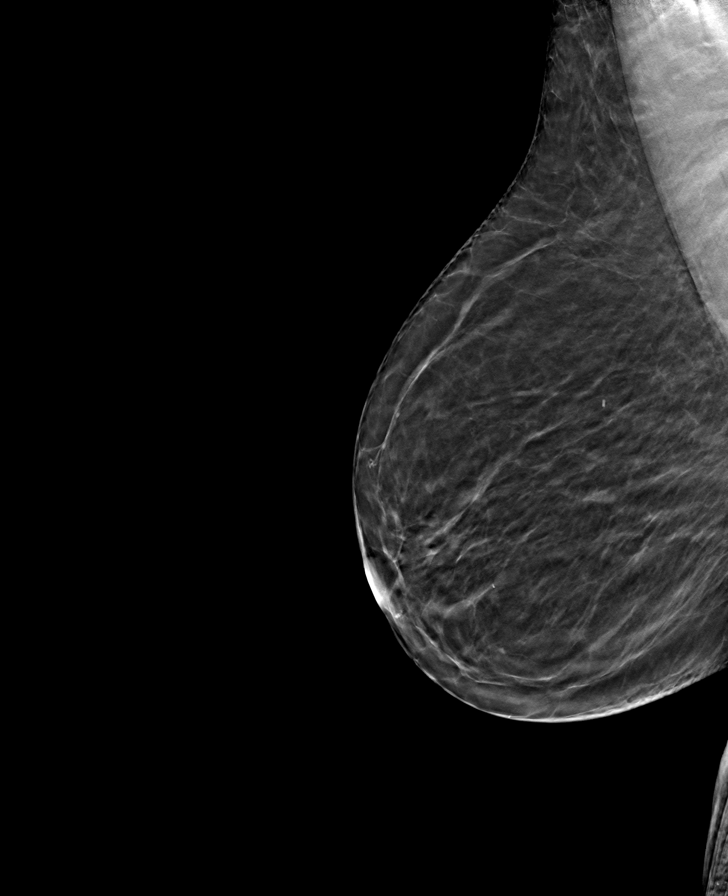

[L MLO tomo · tomo slice 29/56.0]
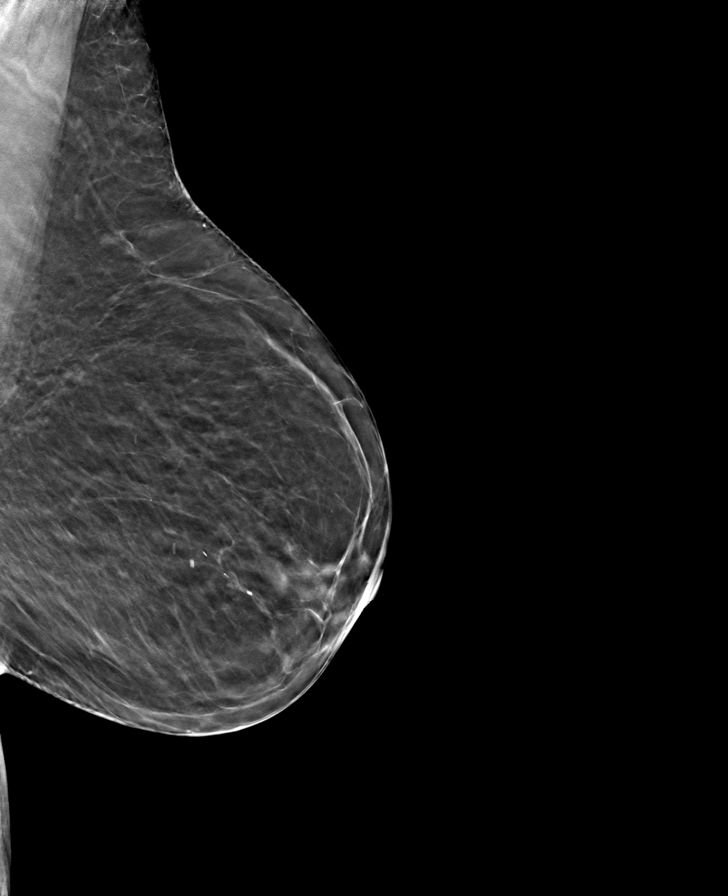

[8 of 24 positions shown; findings below may reference images not displayed]

ACR Breast Density Category b: There are scattered areas of
fibroglandular density.
FINDINGS: There are no findings suspicious for malignancy.
IMPRESSION: No mammographic evidence of malignancy. A result letter of this
screening mammogram will be mailed directly to the patient.

RECOMMENDATION:
Screening mammogram in one year. (Code:51-O-LD2)

BI-RADS CATEGORY  1: Negative.

## 2021-09-17 ENCOUNTER — Other Ambulatory Visit: Payer: Self-pay | Admitting: Family Medicine

## 2021-12-09 DIAGNOSIS — Z1159 Encounter for screening for other viral diseases: Secondary | ICD-10-CM | POA: Diagnosis not present

## 2021-12-10 LAB — HEPATITIS C ANTIBODY: Hep C Virus Ab: NONREACTIVE

## 2021-12-14 ENCOUNTER — Other Ambulatory Visit: Payer: Self-pay | Admitting: Internal Medicine

## 2021-12-15 ENCOUNTER — Encounter: Payer: Self-pay | Admitting: Family Medicine

## 2021-12-15 ENCOUNTER — Encounter: Payer: Self-pay | Admitting: Orthopedic Surgery

## 2021-12-15 ENCOUNTER — Ambulatory Visit (INDEPENDENT_AMBULATORY_CARE_PROVIDER_SITE_OTHER): Payer: PPO | Admitting: Family Medicine

## 2021-12-15 VITALS — BP 138/80 | HR 83 | Ht 63.0 in | Wt 154.0 lb

## 2021-12-15 DIAGNOSIS — Z1231 Encounter for screening mammogram for malignant neoplasm of breast: Secondary | ICD-10-CM | POA: Diagnosis not present

## 2021-12-15 DIAGNOSIS — M170 Bilateral primary osteoarthritis of knee: Secondary | ICD-10-CM

## 2021-12-15 DIAGNOSIS — J309 Allergic rhinitis, unspecified: Secondary | ICD-10-CM | POA: Diagnosis not present

## 2021-12-15 DIAGNOSIS — E7800 Pure hypercholesterolemia, unspecified: Secondary | ICD-10-CM | POA: Diagnosis not present

## 2021-12-15 DIAGNOSIS — R7303 Prediabetes: Secondary | ICD-10-CM

## 2021-12-15 DIAGNOSIS — J302 Other seasonal allergic rhinitis: Secondary | ICD-10-CM | POA: Diagnosis not present

## 2021-12-15 DIAGNOSIS — E559 Vitamin D deficiency, unspecified: Secondary | ICD-10-CM

## 2021-12-15 DIAGNOSIS — I10 Essential (primary) hypertension: Secondary | ICD-10-CM

## 2021-12-15 MED ORDER — GABAPENTIN 100 MG PO CAPS: ORAL_CAPSULE | ORAL | 5 refills | Status: DC

## 2021-12-15 MED ORDER — LORATADINE 10 MG PO TABS: 10.0000 mg | ORAL_TABLET | Freq: Every day | ORAL | 3 refills | Status: DC

## 2021-12-15 MED ORDER — MONTELUKAST SODIUM 10 MG PO TABS: 10.0000 mg | ORAL_TABLET | Freq: Every day | ORAL | 3 refills | Status: DC

## 2021-12-15 MED ORDER — CARVEDILOL 6.25 MG PO TABS: 6.2500 mg | ORAL_TABLET | Freq: Two times a day (BID) | ORAL | 3 refills | Status: DC

## 2021-12-15 MED ORDER — METHYLPREDNISOLONE ACETATE 80 MG/ML IJ SUSP
80.0000 mg | Freq: Once | INTRAMUSCULAR | Status: AC
  Administered 2021-12-15: 80 mg via INTRAMUSCULAR

## 2021-12-15 MED ORDER — PREDNISONE 5 MG PO TABS: 5.0000 mg | ORAL_TABLET | Freq: Two times a day (BID) | ORAL | 0 refills | Status: AC

## 2021-12-15 NOTE — Assessment & Plan Note (Signed)
Uncontrolled pain, depomedrol and short course of prednisone ?

## 2021-12-15 NOTE — Assessment & Plan Note (Signed)
DASH diet and commitment to daily physical activity for a minimum of 30 minutes discussed and encouraged, as a part of hypertension management. ?The importance of attaining a healthy weight is also discussed. ?Controlled, no change in medication ? ? ? ?  12/15/2021  ?  9:46 PM 12/15/2021  ?  9:59 AM 07/15/2021  ?  9:24 AM 07/14/2021  ? 11:08 AM 06/22/2021  ?  3:00 PM 04/06/2021  ? 10:04 AM 03/25/2021  ?  9:41 AM  ?BP/Weight  ?Systolic BP 947 654 650 354 159 139 139  ?Diastolic BP 80 83 81 72 82 75 74  ?Wt. (Lbs)  154 147.6 148 148.08 147.6 145.6  ?BMI  27.28 kg/m2 26.15 kg/m2 26.22 kg/m2 26.23 kg/m2 25.34 kg/m2 24.99 kg/m2  ? ? ? ? ?

## 2021-12-15 NOTE — Patient Instructions (Addendum)
Follow-up early August call if you need me sooner.  Flu vaccine at visit. ? ?Depo-Medrol 80 mg IM in office today for knee pain and uncontrolled allergies.  6-day course of prednisone is also prescribed.  New for allergies is montelukast 1 tablet once daily.  Continue Claritin as before. ? ?Fasting CBC, ipids CMP and EGFR TSH and vitamin D today ? ?Please schedule July mammogram at checkout ? ?Thanks for choosing [facility], we consider it a privelige to serve you. ? ?

## 2021-12-15 NOTE — Progress Notes (Signed)
? ?Kristen Cox     MRN: [id number]      DOB: [birth year] ? ? ?HPI ?Kristen Cox is here for follow up and re-evaluation of chronic medical conditions, medication management and review of any available recent lab and radiology data.  ?Preventive health is updated, specifically  Cancer screening and Immunization.   ?Questions or concerns regarding consultations or procedures which the PT has had in the interim are  addressed. ?The PT denies any adverse reactions to current medications since the last visit.  ?C/o increased and uncontrolled bilateral knee pain, also increased and uncontrolled allergy symptoms, clear nasal drainage and stuffy  nose ? ?ROS ?Denies recent fever or chills. ?Denies , ear pain or sore throat. ?Denies chest congestion, productive cough or wheezing. ?Denies chest pains, palpitations and leg swelling ?Denies abdominal pain, nausea, vomiting,diarrhea or constipation.   ?Denies dysuria, frequency, hesitancy or incontinence. ?Denies headaches, seizures, numbness, or tingling. ?Denies depression, anxiety or insomnia. ?Denies skin break down or rash. ? ? ?PE ? ?BP 138/80   Pulse 83   Ht '5\' 3"'$  (1.6 m)   Wt 154 lb (69.9 kg)   SpO2 96%   BMI 27.28 kg/m?  ? ? ?Patient alert and oriented and in no cardiopulmonary distress. ? ?HEENT: No facial asymmetry, EOMI,     Neck supple . ? ?Chest: Clear to auscultation bilaterally. ? ?CVS: S1, S2 no murmurs, no S3.Regular rate. ? ?ABD: Soft non tender.  ? ?Ext: No edema ? ?MS: Adequate ROM spine, shoulders, hips and knees. ? ?Skin: Intact, no ulcerations or rash noted. ? ?Psych: Good eye contact, normal affect. Memory intact not anxious or depressed appearing. ? ?CNS: CN 2-12 intact, power,  normal throughout.no focal deficits noted. ? ? ?Assessment & Plan ? ?Essential hypertension ?DASH diet and commitment to daily physical activity for a minimum of 30 minutes discussed and encouraged, as a part of hypertension management. ?The importance of attaining a healthy  weight is also discussed. ?Controlled, no change in medication ? ? ? ?  12/15/2021  ?  9:46 PM 12/15/2021  ?  9:59 AM 07/15/2021  ?  9:24 AM 07/14/2021  ? 11:08 AM 06/22/2021  ?  3:00 PM 04/06/2021  ? 10:04 AM 03/25/2021  ?  9:41 AM  ?BP/Weight  ?Systolic BP 381 829 937 169 159 139 139  ?Diastolic BP 80 83 81 72 82 75 74  ?Wt. (Lbs)  154 147.6 148 148.08 147.6 145.6  ?BMI  27.28 kg/m2 26.15 kg/m2 26.22 kg/m2 26.23 kg/m2 25.34 kg/m2 24.99 kg/m2  ? ? ? ? ? ?Allergic rhinitis ?Uncontrolled add montelukast , and short course of prednisone and depomedrol in office ? ?Arthritis of both knees ?Uncontrolled pain, depomedrol and short course of prednisone ? ?Hyperlipemia ?Hyperlipidemia:Low fat diet discussed and encouraged. ? ? ?Lipid Panel  ?Lab Results  ?Component Value Date  ? CHOL 165 07/07/2021  ? HDL 44 07/07/2021  ? LDLCALC 103 (H) 07/07/2021  ? TRIG 95 07/07/2021  ? CHOLHDL 3.8 07/07/2021  ? ? ? ?Updated lab needed ? ?Prediabetes ?Patient educated about the importance of limiting  Carbohydrate intake , the need to commit to daily physical activity for a minimum of 30 minutes , and to commit weight loss. ?The fact that changes in all these areas will reduce or eliminate all together the development of diabetes is stressed.  ?Updated lab needed at/ before next visit. ? ? ?  Latest Ref Rng & Units 07/07/2021  ?  8:13 AM 01/20/2021  ?  8:26 AM 12/26/2020  ?  8:27 AM 12/11/2020  ?  9:12 AM 12/05/2020  ?  9:57 AM  ?Diabetic Labs  ?HbA1c 4.8 - 5.6 % 5.5        ?Chol 100 - 199 mg/dL 165        ?HDL >39 mg/dL 44        ?Calc LDL 0 - 99 mg/dL 103        ?Triglycerides 0 - 149 mg/dL 95        ?Creatinine 0.57 - 1.00 mg/dL 0.87   0.80   0.72   1.15   1.15    ? ? ?  12/15/2021  ?  9:46 PM 12/15/2021  ?  9:59 AM 07/15/2021  ?  9:24 AM 07/14/2021  ? 11:08 AM 06/22/2021  ?  3:00 PM 04/06/2021  ? 10:04 AM 03/25/2021  ?  9:41 AM  ?BP/Weight  ?Systolic BP 170 017 494 496 159 139 139  ?Diastolic BP 80 83 81 72 82 75 74  ?Wt. (Lbs)  154 147.6 148  148.08 147.6 145.6  ?BMI  27.28 kg/m2 26.15 kg/m2 26.22 kg/m2 26.23 kg/m2 25.34 kg/m2 24.99 kg/m2  ? ?   ? View : No data to display.  ?  ?  ?  ? ? ? ? ?

## 2021-12-15 NOTE — Assessment & Plan Note (Signed)
Uncontrolled add montelukast , and short course of prednisone and depomedrol in office ?

## 2021-12-15 NOTE — Assessment & Plan Note (Signed)
Patient educated about the importance of limiting  Carbohydrate intake , the need to commit to daily physical activity for a minimum of 30 minutes , and to commit weight loss. ?The fact that changes in all these areas will reduce or eliminate all together the development of diabetes is stressed.  ?Updated lab needed at/ before next visit. ? ? ?  Latest Ref Rng & Units 07/07/2021  ?  8:13 AM 01/20/2021  ?  8:26 AM 12/26/2020  ?  8:27 AM 12/11/2020  ?  9:12 AM 12/05/2020  ?  9:57 AM  ?Diabetic Labs  ?HbA1c 4.8 - 5.6 % 5.5        ?Chol 100 - 199 mg/dL 165        ?HDL >39 mg/dL 44        ?Calc LDL 0 - 99 mg/dL 103        ?Triglycerides 0 - 149 mg/dL 95        ?Creatinine 0.57 - 1.00 mg/dL 0.87   0.80   0.72   1.15   1.15    ? ? ?  12/15/2021  ?  9:46 PM 12/15/2021  ?  9:59 AM 07/15/2021  ?  9:24 AM 07/14/2021  ? 11:08 AM 06/22/2021  ?  3:00 PM 04/06/2021  ? 10:04 AM 03/25/2021  ?  9:41 AM  ?BP/Weight  ?Systolic BP 242 353 614 431 159 139 139  ?Diastolic BP 80 83 81 72 82 75 74  ?Wt. (Lbs)  154 147.6 148 148.08 147.6 145.6  ?BMI  27.28 kg/m2 26.15 kg/m2 26.22 kg/m2 26.23 kg/m2 25.34 kg/m2 24.99 kg/m2  ? ?   ? View : No data to display.  ?  ?  ?  ? ? ? ?

## 2021-12-15 NOTE — Assessment & Plan Note (Signed)
Hyperlipidemia:Low fat diet discussed and encouraged. ? ? ?Lipid Panel  ?Lab Results  ?Component Value Date  ? CHOL 165 07/07/2021  ? HDL 44 07/07/2021  ? LDLCALC 103 (H) 07/07/2021  ? TRIG 95 07/07/2021  ? CHOLHDL 3.8 07/07/2021  ? ? ? ?Updated lab needed ?

## 2021-12-16 LAB — LIPID PANEL
Chol/HDL Ratio: 3.2 ratio (ref 0.0–4.4)
Cholesterol, Total: 158 mg/dL (ref 100–199)
HDL: 50 mg/dL (ref >39)
LDL Chol Calc (NIH): 89 mg/dL (ref 0–99)
Triglycerides: 106 mg/dL (ref 0–149)
VLDL Cholesterol Cal: 19 mg/dL (ref 5–40)

## 2021-12-16 LAB — CBC
Hematocrit: 39.3 % (ref 34.0–46.6)
Hemoglobin: 13.5 g/dL (ref 11.1–15.9)
MCH: 32.5 pg (ref 26.6–33.0)
MCHC: 34.4 g/dL (ref 31.5–35.7)
MCV: 95 fL (ref 79–97)
Platelets: 279 10*3/uL (ref 150–450)
RBC: 4.15 x10E6/uL (ref 3.77–5.28)
RDW: 13.1 % (ref 11.7–15.4)
WBC: 5.1 10*3/uL (ref 3.4–10.8)

## 2021-12-16 LAB — CMP14+EGFR
ALT: 17 IU/L (ref 0–32)
AST: 23 IU/L (ref 0–40)
Albumin/Globulin Ratio: 2.1 (ref 1.2–2.2)
Albumin: 4.6 g/dL (ref 3.7–4.7)
Alkaline Phosphatase: 85 IU/L (ref 44–121)
BUN/Creatinine Ratio: 15 (ref 12–28)
BUN: 10 mg/dL (ref 8–27)
Bilirubin Total: 0.9 mg/dL (ref 0.0–1.2)
CO2: 25 mmol/L (ref 20–29)
Calcium: 10.9 mg/dL — ABNORMAL HIGH (ref 8.7–10.3)
Chloride: 101 mmol/L (ref 96–106)
Creatinine, Ser: 0.68 mg/dL (ref 0.57–1.00)
Globulin, Total: 2.2 g/dL (ref 1.5–4.5)
Glucose: 99 mg/dL (ref 70–99)
Potassium: 4.5 mmol/L (ref 3.5–5.2)
Sodium: 138 mmol/L (ref 134–144)
Total Protein: 6.8 g/dL (ref 6.0–8.5)
eGFR: 88 mL/min/{1.73_m2} (ref >59)

## 2021-12-16 LAB — VITAMIN D 25 HYDROXY (VIT D DEFICIENCY, FRACTURES): Vit D, 25-Hydroxy: 71.9 ng/mL (ref 30.0–100.0)

## 2021-12-16 LAB — TSH: TSH: 0.714 u[IU]/mL (ref 0.450–4.500)

## 2022-01-04 DIAGNOSIS — I739 Peripheral vascular disease, unspecified: Secondary | ICD-10-CM | POA: Diagnosis not present

## 2022-01-04 DIAGNOSIS — B351 Tinea unguium: Secondary | ICD-10-CM | POA: Diagnosis not present

## 2022-01-11 ENCOUNTER — Other Ambulatory Visit: Payer: Self-pay | Admitting: Internal Medicine

## 2022-01-11 DIAGNOSIS — E7800 Pure hypercholesterolemia, unspecified: Secondary | ICD-10-CM

## 2022-01-20 ENCOUNTER — Ambulatory Visit (INDEPENDENT_AMBULATORY_CARE_PROVIDER_SITE_OTHER): Payer: PPO | Admitting: Adult Health

## 2022-01-20 ENCOUNTER — Encounter: Payer: Self-pay | Admitting: Adult Health

## 2022-01-20 VITALS — BP 140/82 | HR 82 | Ht 64.0 in | Wt 150.0 lb

## 2022-01-20 DIAGNOSIS — Z1211 Encounter for screening for malignant neoplasm of colon: Secondary | ICD-10-CM | POA: Insufficient documentation

## 2022-01-20 DIAGNOSIS — Z01419 Encounter for gynecological examination (general) (routine) without abnormal findings: Secondary | ICD-10-CM | POA: Diagnosis not present

## 2022-01-20 LAB — HEMOCCULT GUIAC POC 1CARD (OFFICE): Fecal Occult Blood, POC: NEGATIVE

## 2022-01-20 NOTE — Progress Notes (Signed)
Patient ID: Kristen Cox, female   DOB: 02/07/1941, 81 y.o.   MRN: [id number] History of Present Illness: Kristen Cox is a 81 year old black female,widowed, PM in for well woman gyn exam, she no longer gets paps. She fell last week, and is sore, she is using a cane.  PCP is Kristen Cox.   Current Medications, Allergies, Past Medical History, Past Surgical History, Family History and Social History were reviewed in [employer] record.     Review of Systems: Patient denies any headaches, hearing loss, fatigue, blurred vision, shortness of breath, chest pain, abdominal pain, problems with bowel movements, urination, or intercourse(Not active). No joint pain or mood swings.     Physical Exam:BP 140/82 (BP Location: Right Arm, Patient Position: Sitting, Cuff Size: Normal)   Pulse 82   Ht '5\' 4"'$  (1.626 m)   Wt 150 lb (68 kg)   BMI 25.75 kg/m   General:  Well developed, well nourished, no acute distress Skin:  Warm and dry Neck:  Midline trachea, normal thyroid, good ROM, no lymphadenopathy,no carotid bruits heard  Lungs; Clear to auscultation bilaterally Breast:  No dominant palpable mass, retraction, or nipple discharge Cardiovascular: Regular rate and rhythm Abdomen:  Soft, non tender, no hepatosplenomegaly Pelvic:  External genitalia is normal in appearance, no lesions.  The vagina is pale with loss of rugae.Urethra has no lesions or masses. The cervix is smooth.  Uterus is felt to be normal size, shape, and contour.  No adnexal masses or tenderness noted.Bladder is non tender, no masses felt. Rectal: Good sphincter tone, no polyps, or hemorrhoids felt.  Hemoccult negative. Extremities/musculoskeletal:  No swelling or varicosities noted, no clubbing or cyanosis Psych:  No mood changes, alert and cooperative,seems happy AA is 0 Fall risk is moderate    01/20/2022    8:40 AM 12/15/2021   10:00 AM 07/14/2021   11:09 AM  Depression screen PHQ 2/9  Decreased Interest 0  0 0  Down, Depressed, Hopeless 0 0 0  PHQ - 2 Score 0 0 0  Altered sleeping 0    Tired, decreased energy 2    Change in appetite 0    Feeling bad or failure about yourself  0    Trouble concentrating 0    Moving slowly or fidgety/restless 0    Suicidal thoughts 0    PHQ-9 Score 2         01/20/2022    8:40 AM  GAD 7 : Generalized Anxiety Score  Nervous, Anxious, on Edge 0  Control/stop worrying 0  Worry too much - different things 0  Trouble relaxing 0  Restless 0  Easily annoyed or irritable 0  Afraid - awful might happen 0  Total GAD 7 Score 0  Examination chaperoned by Kristen Squibb LPN     Impression and Plan: 1. Encounter for well woman exam with routine gynecological exam Physical with PCP next year Labs with PCP Mammogram in July   2. Encounter for screening fecal occult blood testing Hemoccult was negative

## 2022-01-27 ENCOUNTER — Ambulatory Visit (INDEPENDENT_AMBULATORY_CARE_PROVIDER_SITE_OTHER): Payer: PPO

## 2022-01-27 ENCOUNTER — Ambulatory Visit: Payer: PPO

## 2022-01-27 ENCOUNTER — Encounter: Payer: Self-pay | Admitting: Orthopedic Surgery

## 2022-01-27 ENCOUNTER — Ambulatory Visit: Payer: PPO | Admitting: Orthopedic Surgery

## 2022-01-27 VITALS — BP 194/101 | HR 88 | Ht 64.0 in | Wt 150.0 lb

## 2022-01-27 DIAGNOSIS — G8929 Other chronic pain: Secondary | ICD-10-CM | POA: Diagnosis not present

## 2022-01-27 DIAGNOSIS — M170 Bilateral primary osteoarthritis of knee: Secondary | ICD-10-CM

## 2022-01-27 DIAGNOSIS — M25562 Pain in left knee: Secondary | ICD-10-CM

## 2022-01-27 DIAGNOSIS — M25561 Pain in right knee: Secondary | ICD-10-CM | POA: Diagnosis not present

## 2022-01-27 MED ORDER — TRAMADOL HCL 50 MG PO TABS: 50.0000 mg | ORAL_TABLET | Freq: Four times a day (QID) | ORAL | 0 refills | Status: AC | PRN

## 2022-01-27 NOTE — Patient Instructions (Signed)
Expect some pain for 4 - 6 more weeks   Take the medication as ordered for 2 weeks   You have received an injection of steroids into the joint. 15% of patients will have increased pain within the 24 hours postinjection.   This is transient and will go away.   We recommend that you use ice packs on the injection site for 20 minutes every 2 hours and extra strength Tylenol 2 tablets every 8 as needed until the pain resolves.  If you continue to have pain after taking the Tylenol and using the ice please call the office for further instructions.

## 2022-01-27 NOTE — Progress Notes (Signed)
Chief Complaint  Patient presents with   Knee Pain    [address] on both knees has pain now May 24th 213   81 year old female with knee arthritis moderate disease fell on both knees May 24.  Now has to use a cane.  Says they are very sore.  She took some ibuprofen it did not help.  She tried some Aspercreme that did not help either.  She presents for evaluation and management  She has not had any x-rays done  Examination reveals that she is using a cane she is ambulatory.  She has full extension in both knees indicating no quadriceps or extensor mechanism disruption.  The knees feel stable indicating cruciate ligament stability bilaterally  She does have diffuse periarticular tenderness in both knees without effusion   X-rays both knees severe oa right knee and mod oa left kne no frx   Assessment and plan  Encounter Diagnoses  Name Primary?   Arthritis of both knees    Chronic pain of left knee    Chronic pain of right knee    Acute pain of both knees Yes     Procedure note for bilateral knee injections  Procedure note left knee injection verbal consent was obtained to inject left knee joint  Timeout was completed to confirm the site of injection  The medications used were 40 mg depomedrol and 3 cc of 1% lidocaine  Anesthesia was provided by ethyl chloride and the skin was prepped with alcohol.  After cleaning the skin with alcohol a 20-gauge needle was used to inject the left knee joint. There were no complications. A sterile bandage was applied.   Procedure note right knee injection verbal consent was obtained to inject right knee joint  Timeout was completed to confirm the site of injection  The medications used were 40 mg depomedrol and 3 cc of 1% lidocaine  Anesthesia was provided by ethyl chloride and the skin was prepped with alcohol.  After cleaning the skin with alcohol a 20-gauge needle was used to inject the right knee joint. There were no complications. A sterile  bandage was applied.   Meds ordered this encounter  Medications   traMADol (ULTRAM) 50 MG tablet    Sig: Take 1 tablet (50 mg total) by mouth every 6 (six) hours as needed for up to 14 days.    Dispense:  56 tablet    Refill:  0     Fu prn

## 2022-03-01 ENCOUNTER — Ambulatory Visit (HOSPITAL_COMMUNITY)
Admission: RE | Admit: 2022-03-01 | Discharge: 2022-03-01 | Disposition: A | Discharge status: Home / Self Care | Payer: PPO | Source: Ambulatory Visit | Attending: Family Medicine | Admitting: Family Medicine

## 2022-03-01 DIAGNOSIS — Z1231 Encounter for screening mammogram for malignant neoplasm of breast: Secondary | ICD-10-CM | POA: Diagnosis not present

## 2022-03-13 DIAGNOSIS — M62838 Other muscle spasm: Secondary | ICD-10-CM | POA: Diagnosis not present

## 2022-03-13 DIAGNOSIS — M542 Cervicalgia: Secondary | ICD-10-CM | POA: Diagnosis not present

## 2022-03-16 DIAGNOSIS — E213 Hyperparathyroidism, unspecified: Secondary | ICD-10-CM | POA: Diagnosis not present

## 2022-03-16 DIAGNOSIS — E559 Vitamin D deficiency, unspecified: Secondary | ICD-10-CM | POA: Diagnosis not present

## 2022-03-17 LAB — PTH, INTACT AND CALCIUM
Calcium: 9.9 mg/dL (ref 8.7–10.3)
PTH: 30 pg/mL (ref 15–65)

## 2022-03-17 LAB — VITAMIN D 25 HYDROXY (VIT D DEFICIENCY, FRACTURES): Vit D, 25-Hydroxy: 70.8 ng/mL (ref 30.0–100.0)

## 2022-03-25 ENCOUNTER — Encounter: Payer: Self-pay | Admitting: Nurse Practitioner

## 2022-03-25 ENCOUNTER — Ambulatory Visit: Payer: PPO | Admitting: Nurse Practitioner

## 2022-03-25 VITALS — BP 142/74 | HR 93 | Ht 64.0 in | Wt 161.0 lb

## 2022-03-25 DIAGNOSIS — E213 Hyperparathyroidism, unspecified: Secondary | ICD-10-CM

## 2022-03-25 NOTE — Progress Notes (Signed)
03/25/2022                 Endocrinology follow-up note   Subjective:    Patient ID: Kristen Cox, female    DOB: 1941/01/06, PCP Kristen Helper, MD   Past Medical History:  Diagnosis Date   Deep vein thrombosis (DVT) ([place]) 08/2016   Right leg   Dry eyes 2010   on restasis - Dr. Jorja Loa    Gout 2009   Hyperlipidemia    Hypertension    Spinal stenosis    Past Surgical History:  Procedure Laterality Date   CESAREAN SECTION     COLONOSCOPY  04/09/2009   IWO:EHOZ papilla otherwise, normal rectum/Left-sided diverticula/Cecal polyp status post cold snare and removal/The remainder of the colonic mucosa appeared normal. Adenomatous polyps, next TCS 03/2016 per RMR.   COLONOSCOPY N/A 05/19/2016   Procedure: COLONOSCOPY;  Surgeon: Daneil Dolin, MD;  Location: [facility];  Service: Endoscopy;  Laterality: N/A;  1:00 PM   Social History   Socioeconomic History   Marital status: Widowed    Spouse name: Not on file   Number of children: 2   Years of education: Not on file   Highest education level: 10th grade  Occupational History   Occupation: retired   Tobacco Use   Smoking status: Former    Packs/day: 0.50    Years: 10.00    Total pack years: 5.00    Types: Cigarettes    Quit date: 10/07/1983    Years since quitting: 38.4   Smokeless tobacco: Never  Vaping Use   Vaping Use: Never used  Substance and Sexual Activity   Alcohol use: No   Drug use: No   Sexual activity: Not Currently    Birth control/protection: Post-menopausal  Other Topics Concern   Not on file  Social History Narrative   Not on file   Social Determinants of Health   Financial Resource Strain: Low Risk  (01/20/2022)   Overall Financial Resource Strain (CARDIA)    Difficulty of Paying Living Expenses: Not hard at all  Food Insecurity: No Food Insecurity (01/20/2022)   Hunger Vital Sign    Worried About Running Out of Food in the Last Year: Never true    Botines in the Last Year:  Never true  Transportation Needs: No Transportation Needs (01/20/2022)   PRAPARE - Hydrologist (Medical): No    Lack of Transportation (Non-Medical): No  Physical Activity: Inactive (01/20/2022)   Exercise Vital Sign    Days of Exercise per Week: 0 days    Minutes of Exercise per Session: 0 min  Stress: No Stress Concern Present (01/20/2022)   [place]    Feeling of Stress : Not at all  Social Connections: Socially Isolated (01/20/2022)   Social Connection and Isolation Panel [NHANES]    Frequency of Communication with Friends and Family: More than three times a week    Frequency of Social Gatherings with Friends and Family: Never    Attends Religious Services: Never    Marine scientist or Organizations: No    Attends Archivist Meetings: Never    Marital Status: Widowed   Outpatient Encounter Medications as of 03/25/2022  Medication Sig   amLODipine (NORVASC) 10 MG tablet Take 10 mg by mouth daily.   aspirin EC 81 MG tablet Take 81 mg by mouth daily.   atorvastatin (LIPITOR) 40  MG tablet Take 1 tablet (40 mg total) by mouth daily.   carvedilol (COREG) 6.25 MG tablet Take 6.25 mg by mouth 2 (two) times daily with a meal.   cholecalciferol (VITAMIN D3) 25 MCG (1000 UNIT) tablet Take 1,000 Units by mouth daily.   gabapentin (NEURONTIN) 100 MG capsule Take two capsules at bedtime   loratadine (CLARITIN) 10 MG tablet Take 1 tablet (10 mg total) by mouth daily.   Polyethyl Glycol-Propyl Glycol 0.4-0.3 % SOLN Apply 1 drop to eye 2 (two) times daily.   [DISCONTINUED] montelukast (SINGULAIR) 10 MG tablet Take 1 tablet (10 mg total) by mouth at bedtime.   No facility-administered encounter medications on file as of 03/25/2022.   ALLERGIES: Allergies  Allergen Reactions   Benazepril Other (See Comments)    Patient does not recall being on this medication and/or what reaction she might  have had.   Metoprolol Other (See Comments)    CRAMPS CRAMPS CRAMPS   VACCINATION STATUS: Immunization History  Administered Date(s) Administered   Fluad Quad(high Dose 65+) 06/06/2019, 07/08/2020, 07/28/2020, 07/14/2021   Influenza, High Dose Seasonal PF 06/05/2020   Influenza,inj,Quad PF,6+ Mos 06/12/2014, 08/21/2015, 07/08/2016, 06/23/2017, 04/18/2018   Pneumococcal Conjugate-13 11/13/2014   Pneumococcal Polysaccharide-23 02/17/2016   Tdap 02/01/2011    HPI  81 year old female with medical history as above. She is being seen in follow-up for hypercalcemia related to mild hyperparathyroidism.  She is currently being observed only.  She has not required any surgical intervention nor pharmaceutical intervention at this time.    -Her previsit labs show serum calcium of 9.9, improved from 10.3 before last visit.  Her PTH remains stable. -She is currently taking vitamin D supplement 5000 units of vitamin D3 daily and recent Vitamin d level was 70.8.  -She denies any new complaints.   She denies any family history of parathyroid dysfunction.  She denies any hx of CVA, seizures, nephrolithiasis, nor CKD. she denies Osteoporosis. she is a former smoker.  She has not had a bone density test since 2015.   She denies any new complaints.   Review of systems  Constitutional: + Minimally fluctuating body weight,  current Body mass index is 27.64 kg/m. , no fatigue, no subjective hyperthermia, no subjective hypothermia Eyes: no blurry vision, no xerophthalmia ENT: no sore throat, no nodules palpated in throat, no dysphagia/odynophagia, no hoarseness Cardiovascular: no chest pain, no shortness of breath, no palpitations, no leg swelling Respiratory: no cough, no shortness of breath Gastrointestinal: no nausea/vomiting/diarrhea Musculoskeletal: + muscle/joint aches- OA Skin: no rashes, no hyperemia Neurological: no tremors, no numbness, no tingling, no dizziness Psychiatric: no  depression, no anxiety   Objective:    BP (!) 142/74   Pulse 93   Ht '5\' 4"'$  (1.626 m)   Wt 161 lb (73 kg)   BMI 27.64 kg/m   Wt Readings from Last 3 Encounters:  03/25/22 161 lb (73 kg)  01/27/22 150 lb (68 kg)  01/20/22 150 lb (68 kg)    BP Readings from Last 3 Encounters:  03/25/22 (!) 142/74  01/27/22 (!) 194/101  01/20/22 140/82     Physical Exam- Limited  Constitutional:  Body mass index is 27.64 kg/m. , not in acute distress, normal state of mind Eyes:  EOMI, no exophthalmos Neck: Supple Cardiovascular: RRR, no murmurs, rubs, or gallops, no edema Respiratory: Adequate breathing efforts, no crackles, rales, rhonchi, or wheezing Musculoskeletal: no gross deformities, strength intact in all four extremities, no gross restriction of joint movements Skin:  no rashes, no hyperemia Neurological: no tremor with outstretched hands   Diabetic Labs (most recent): Lab Results  Component Value Date   HGBA1C 5.5 07/07/2021   HGBA1C 5.9 (H) 11/25/2020   HGBA1C 5.6 07/01/2016    Lipid Panel     Component Value Date/Time   CHOL 158 12/15/2021 1126   TRIG 106 12/15/2021 1126   HDL 50 12/15/2021 1126   CHOLHDL 3.2 12/15/2021 1126   CHOLHDL 4.4 06/06/2019 1422   VLDL 19 02/11/2016 0718   LDLCALC 89 12/15/2021 1126   LDLCALC 139 (H) 06/06/2019 1422   Recent Results (from the past 2160 hour(s))  POCT occult blood stool     Status: None   Collection Time: 01/20/22  9:04 AM  Result Value Ref Range   Fecal Occult Blood, POC Negative Negative   Card #1 Date     Card #2 Fecal Occult Blod, POC     Card #2 Date     Card #3 Fecal Occult Blood, POC     Card #3 Date    PTH, intact and calcium     Status: None   Collection Time: 03/16/22  9:35 AM  Result Value Ref Range   Calcium 9.9 8.7 - 10.3 mg/dL   PTH 30 15 - 65 pg/mL   PTH Interp Comment     Comment: Interpretation                 Intact PTH    Calcium                                 (pg/mL)      (mg/dL) Normal                           15 - 65     8.6 - 10.2 Primary Hyperparathyroidism         >65          >10.2 Secondary Hyperparathyroidism       >65          <10.2 Non-Parathyroid Hypercalcemia       <65          >10.2 Hypoparathyroidism                  <15          < 8.6 Non-Parathyroid Hypocalcemia    15 - 65          < 8.6   VITAMIN D 25 Hydroxy (Vit-D Deficiency, Fractures)     Status: None   Collection Time: 03/16/22  9:35 AM  Result Value Ref Range   Vit D, 25-Hydroxy 70.8 30.0 - 100.0 ng/mL    Comment: Vitamin D deficiency has been defined by the Institute of Medicine and an Endocrine Society practice guideline as a level of serum 25-OH vitamin D less than 20 ng/mL (1,2). The Endocrine Society went on to further define vitamin D insufficiency as a level between 21 and 29 ng/mL (2). 1. IOM (Institute of Medicine). 2010. Dietary reference    intakes for calcium and D. [place]: The    [employer]. 2. Holick MF, [place], Bischoff-Ferrari HA, et al.    Evaluation, treatment, and prevention of vitamin D    deficiency: an Endocrine Society clinical practice    guideline. JCEM. 2011 Jul; 96(7):1911-30.    Results for YULISSA, NEEDHAM (MRN  [id number]) as of 03/24/2020 13:12  Ref. Range 11/09/2018 12:27 06/06/2019 14:22 06/12/2019 09:56 12/12/2019 09:29 03/10/2020 07:49  Calcium Latest Ref Range: 8.6 - 10.4 mg/dL 10.7 (H) 11.0 (H) 10.1 11.1 (H) 10.2   Results for VERLA, BRYNGELSON (MRN [id number]) as of 03/25/2021 09:43  Ref. Range 03/16/2021 08:24  Calcium Latest Ref Range: 8.7 - 10.3 mg/dL 10.3  Vitamin D, 25-Hydroxy Latest Ref Range: 30.0 - 100.0 ng/mL 77.6  PTH, Intact Latest Ref Range: 15 - 65 pg/mL 33  PTH Interp Unknown Comment    Latest Reference Range & Units 03/16/22 09:35  Calcium 8.7 - 10.3 mg/dL 9.9  Vitamin D, 25-Hydroxy 30.0 - 100.0 ng/mL 70.8  PTH, Intact 15 - 65 pg/mL 30  PTH Interp  Comment    Assessment & Plan:   1. Hypercalcemia- mild primary  hyperparathyroidism Her repeat labs have been reviewed with her.  Her PTH-rp was < 2.0 previously.   Her calcium level is stable, again at 9.9 with corresponding PTH of 30.    -She will not require any intervention at this time, neither surgical or pharmaceutical.  We will repeat PTH and calcium levels prior to next visit in 1 year.  - I discussed the potential complications of untreated hypercalcemia.  She is benefiting from continued vitamin D supplement, currently 5000 units of vitamin D3 daily.  Advised her to take maintenance dose of vitamin D3 5000 PO units daily.  -She was previously documented to have normal bone density.  She will be considered for Sensipar treatment if calcium levels become and remain elevated at subsequent visits.  - I advised patient to maintain close follow up with Kristen Helper, MD for primary care needs.     I spent 32 minutes in the care of the patient today including review of labs from Thyroid Function, CMP, and other relevant labs ; imaging/biopsy records (current and previous including abstractions from other facilities); face-to-face time discussing  her lab results and symptoms, medications doses, her options of short and long term treatment based on the latest standards of care / guidelines;   and documenting the encounter.  Kristen Cox  participated in the discussions, expressed understanding, and voiced agreement with the above plans.  All questions were answered to her satisfaction. she is encouraged to contact clinic should she have any questions or concerns prior to her return visit.   Follow up plan: Return in about 1 year (around 03/26/2023) for hypercalcemia follow up with labs.  Kristen Cox, [facility] [address] [place], [place] [address] Phone: [phone] Fax: [phone]   03/25/2022, 9:45 AM

## 2022-03-31 ENCOUNTER — Encounter: Payer: Self-pay | Admitting: Family Medicine

## 2022-03-31 ENCOUNTER — Ambulatory Visit (INDEPENDENT_AMBULATORY_CARE_PROVIDER_SITE_OTHER): Payer: PPO | Admitting: Family Medicine

## 2022-03-31 VITALS — BP 172/98 | HR 58 | Ht 64.0 in | Wt 158.1 lb

## 2022-03-31 DIAGNOSIS — M5441 Lumbago with sciatica, right side: Secondary | ICD-10-CM

## 2022-03-31 DIAGNOSIS — I10 Essential (primary) hypertension: Secondary | ICD-10-CM

## 2022-03-31 DIAGNOSIS — E7800 Pure hypercholesterolemia, unspecified: Secondary | ICD-10-CM

## 2022-03-31 DIAGNOSIS — Z9181 History of falling: Secondary | ICD-10-CM

## 2022-03-31 DIAGNOSIS — G8929 Other chronic pain: Secondary | ICD-10-CM

## 2022-03-31 MED ORDER — AMLODIPINE BESYLATE 10 MG PO TABS: 10.0000 mg | ORAL_TABLET | Freq: Every day | ORAL | 5 refills | Status: DC

## 2022-03-31 MED ORDER — PREDNISONE 5 MG PO TABS: 5.0000 mg | ORAL_TABLET | Freq: Two times a day (BID) | ORAL | 0 refills | Status: AC

## 2022-03-31 NOTE — Patient Instructions (Addendum)
F/U in mid September, re evaluate blood pressure and flu vaccine  Start amlodipine 10 mg one daily, your blood pressure is high, you need this  Take carvedilol at 9am and 9pm  5 day course of prednisone is prescribed for backpain  Get fall alert necklace, and try to drink most of your liquid by 7 pm so you do not have to get up at night to go to the bathroom as often  Fasting lipid, cmp and EGFR 3 days before next visit   Thanks for choosing [facility], we consider it a privelige to serve you.

## 2022-04-04 ENCOUNTER — Encounter: Payer: Self-pay | Admitting: Family Medicine

## 2022-04-04 DIAGNOSIS — Z9181 History of falling: Secondary | ICD-10-CM | POA: Insufficient documentation

## 2022-04-04 NOTE — Progress Notes (Signed)
   Kristen Cox     MRN: [id number]      DOB: 1940-11-07   HPI [name] is here for follow up and re-evaluation of chronic medical conditions, medication management and review of any available recent lab and radiology data.  Preventive health is updated, specifically  Cancer screening and Immunization.   Questions or concerns regarding consultations or procedures which the PT has had in the interim are  addressed. The PT denies any adverse reactions to current medications since the last visit.  C/o increased back pain following fall in June  ROS Denies recent fever or chills. Denies sinus pressure, nasal congestion, ear pain or sore throat. Denies chest congestion, productive cough or wheezing. Denies chest pains, palpitations and leg swelling Denies abdominal pain, nausea, vomiting,diarrhea or constipation.   Denies dysuria, frequency, hesitancy or incontinence. . Denies headaches, seizures, numbness, or tingling. Denies depression, anxiety or insomnia. Denies skin break down or rash.   PE  BP (!) 172/98 (BP Location: Right Arm, Patient Position: Sitting, Cuff Size: Normal)   Pulse (!) 58   Ht '5\' 4"'$  (1.626 m)   Wt 158 lb 1.9 oz (71.7 kg)   SpO2 96%   BMI 27.14 kg/m   Patient alert and oriented and in no cardiopulmonary distress.  HEENT: No facial asymmetry, EOMI,     Neck decreased ROM  Chest: Clear to auscultation bilaterally.  CVS: S1, S2 no murmurs, no S3.Regular rate.  ABD: Soft non tender.   Ext: No edema  MS: decreased ROM spine, adequate in shoulders, hips and knees.  Skin: Intact, no ulcerations or rash noted.  Psych: Good eye contact, normal affect. Memory intact not anxious or depressed appearing.  CNS: CN 2-12 intact, power,  normal throughout.no focal deficits noted.   Assessment & Plan  Essential hypertension Uncontrolled , increase amlodipine dose, re eval in 8 weeks  Hyperlipemia Hyperlipidemia:Low fat diet discussed and  encouraged.   Lipid Panel  Lab Results  Component Value Date   CHOL 158 12/15/2021   HDL 50 12/15/2021   LDLCALC 89 12/15/2021   TRIG 106 12/15/2021   CHOLHDL 3.2 12/15/2021   Controlled, no change in medication    Back pain Currently increased and uncontrolled, trigger is recent fall short course of prednisone is prescribed  H/O falling Home safety and fall risk reduction discussed

## 2022-04-04 NOTE — Assessment & Plan Note (Signed)
Currently increased and uncontrolled, trigger is recent fall short course of prednisone is prescribed

## 2022-04-04 NOTE — Assessment & Plan Note (Signed)
Hyperlipidemia:Low fat diet discussed and encouraged.   Lipid Panel  Lab Results  Component Value Date   CHOL 158 12/15/2021   HDL 50 12/15/2021   LDLCALC 89 12/15/2021   TRIG 106 12/15/2021   CHOLHDL 3.2 12/15/2021   Controlled, no change in medication

## 2022-04-04 NOTE — Assessment & Plan Note (Signed)
Home safety and fall risk reduction discussed 

## 2022-04-04 NOTE — Assessment & Plan Note (Signed)
Uncontrolled , increase amlodipine dose, re eval in 8 weeks

## 2022-04-15 ENCOUNTER — Other Ambulatory Visit: Payer: Self-pay | Admitting: Family Medicine

## 2022-04-15 DIAGNOSIS — E7800 Pure hypercholesterolemia, unspecified: Secondary | ICD-10-CM

## 2022-04-28 DIAGNOSIS — E7800 Pure hypercholesterolemia, unspecified: Secondary | ICD-10-CM | POA: Diagnosis not present

## 2022-04-28 DIAGNOSIS — I10 Essential (primary) hypertension: Secondary | ICD-10-CM | POA: Diagnosis not present

## 2022-04-29 LAB — CMP14+EGFR
ALT: 18 IU/L (ref 0–32)
AST: 24 IU/L (ref 0–40)
Albumin/Globulin Ratio: 1.6 (ref 1.2–2.2)
Albumin: 4 g/dL (ref 3.8–4.8)
Alkaline Phosphatase: 67 IU/L (ref 44–121)
BUN/Creatinine Ratio: 15 (ref 12–28)
BUN: 12 mg/dL (ref 8–27)
Bilirubin Total: 0.7 mg/dL (ref 0.0–1.2)
CO2: 23 mmol/L (ref 20–29)
Calcium: 10.1 mg/dL (ref 8.7–10.3)
Chloride: 105 mmol/L (ref 96–106)
Creatinine, Ser: 0.8 mg/dL (ref 0.57–1.00)
Globulin, Total: 2.5 g/dL (ref 1.5–4.5)
Glucose: 102 mg/dL — ABNORMAL HIGH (ref 70–99)
Potassium: 4.5 mmol/L (ref 3.5–5.2)
Sodium: 141 mmol/L (ref 134–144)
Total Protein: 6.5 g/dL (ref 6.0–8.5)
eGFR: 74 mL/min/{1.73_m2} (ref >59)

## 2022-04-29 LAB — LIPID PANEL
Chol/HDL Ratio: 2.7 ratio (ref 0.0–4.4)
Cholesterol, Total: 148 mg/dL (ref 100–199)
HDL: 55 mg/dL (ref >39)
LDL Chol Calc (NIH): 78 mg/dL (ref 0–99)
Triglycerides: 75 mg/dL (ref 0–149)
VLDL Cholesterol Cal: 15 mg/dL (ref 5–40)

## 2022-05-07 ENCOUNTER — Ambulatory Visit (INDEPENDENT_AMBULATORY_CARE_PROVIDER_SITE_OTHER): Payer: PPO | Admitting: Family Medicine

## 2022-05-07 ENCOUNTER — Encounter: Payer: Self-pay | Admitting: Family Medicine

## 2022-05-07 VITALS — BP 150/70 | HR 96 | Ht 64.0 in | Wt 159.1 lb

## 2022-05-07 DIAGNOSIS — E663 Overweight: Secondary | ICD-10-CM

## 2022-05-07 DIAGNOSIS — Z23 Encounter for immunization: Secondary | ICD-10-CM | POA: Diagnosis not present

## 2022-05-07 DIAGNOSIS — M79605 Pain in left leg: Secondary | ICD-10-CM | POA: Diagnosis not present

## 2022-05-07 DIAGNOSIS — E7800 Pure hypercholesterolemia, unspecified: Secondary | ICD-10-CM | POA: Diagnosis not present

## 2022-05-07 DIAGNOSIS — I10 Essential (primary) hypertension: Secondary | ICD-10-CM

## 2022-05-07 MED ORDER — CHLORTHALIDONE 25 MG PO TABS: 25.0000 mg | ORAL_TABLET | Freq: Every day | ORAL | 2 refills | Status: DC

## 2022-05-07 NOTE — Assessment & Plan Note (Signed)
Hyperlipidemia:Low fat diet discussed and encouraged.   Lipid Panel  Lab Results  Component Value Date   CHOL 148 04/28/2022   HDL 55 04/28/2022   LDLCALC 78 04/28/2022   TRIG 75 04/28/2022   CHOLHDL 2.7 04/28/2022     excellet , no med change

## 2022-05-07 NOTE — Assessment & Plan Note (Signed)
  Patient re-educated about  the importance of commitment to a  minimum of 150 minutes of exercise per week as able.  The importance of healthy food choices with portion control discussed, as well as eating regularly and within a 12 hour window most days. The need to choose "clean , green" food 50 to 75% of the time is discussed, as well as to make water the primary drink and set a goal of 64 ounces water daily.       05/07/2022    4:24 PM 03/31/2022   10:10 AM 03/25/2022    9:16 AM  Weight /BMI  Weight 159 lb 1.9 oz 158 lb 1.9 oz 161 lb  Height '5\' 4"'$  (1.626 m) '5\' 4"'$  (1.626 m) '5\' 4"'$  (1.626 m)  BMI 27.31 kg/m2 27.14 kg/m2 27.64 kg/m2

## 2022-05-07 NOTE — Assessment & Plan Note (Signed)
Controlled, no change in medication  

## 2022-05-07 NOTE — Progress Notes (Signed)
Kristen Cox     MRN: [id number]      DOB: 12/22/1940   HPI Ms. Kristen Cox is here for follow up and re-evaluation of chronic medical conditions, medication management and review of any available recent lab and radiology data.  Preventive health is updated, specifically  Cancer screening and Immunization.   Questions or concerns regarding consultations or procedures which the PT has had in the interim are  addressed. The PT denies any adverse reactions to current medications since the last visit.  There are no new concerns.  There are no specific complaints   ROS Denies recent fever or chills. Denies sinus pressure, nasal congestion, ear pain or sore throat. Denies chest congestion, productive cough or wheezing. Denies chest pains, palpitations and leg swelling Denies abdominal pain, nausea, vomiting,diarrhea or constipation.   Denies dysuria, frequency, hesitancy or incontinence. Denies joint pain, swelling and limitation in mobility. Denies headaches, seizures, numbness, or tingling. Denies depression, anxiety or insomnia. Denies skin break down or rash.   PE  BP (!) 150/70   Pulse 96   Ht '5\' 4"'$  (1.626 m)   Wt 159 lb 1.9 oz (72.2 kg)   SpO2 94%   BMI 27.31 kg/m   Patient alert and oriented and in no cardiopulmonary distress.  HEENT: No facial asymmetry, EOMI,     Neck supple .  Chest: Clear to auscultation bilaterally.  CVS: S1, S2 no murmurs, no S3.Regular rate.  ABD: Soft non tender.   Ext: No edema  MS: Adequate ROM spine, shoulders, hips and knees.  Skin: Intact, no ulcerations or rash noted.  Psych: Good eye contact, normal affect. Memory intact not anxious or depressed appearing.  CNS: CN 2-12 intact, power,  normal throughout.no focal deficits noted.   Assessment & Plan  Essential hypertension Improved but still not at goal, add spironolactone 25 mg daily DASH diet and commitment to daily physical activity for a minimum of 30 minutes discussed and  encouraged, as a part of hypertension management. The importance of attaining a healthy weight is also discussed.     05/07/2022    4:47 PM 05/07/2022    4:27 PM 05/07/2022    4:24 PM 03/31/2022   10:11 AM 03/31/2022   10:10 AM 03/25/2022    9:16 AM 01/27/2022    9:25 AM  BP/Weight  Systolic BP 962 229 798 921 194 174 081  Diastolic BP 70 88 91 98 448 74 101  Wt. (Lbs)   159.12  158.12 161 150  BMI   27.31 kg/m2  27.14 kg/m2 27.64 kg/m2 25.75 kg/m2       Hyperlipemia Hyperlipidemia:Low fat diet discussed and encouraged.   Lipid Panel  Lab Results  Component Value Date   CHOL 148 04/28/2022   HDL 55 04/28/2022   LDLCALC 78 04/28/2022   TRIG 75 04/28/2022   CHOLHDL 2.7 04/28/2022     excellet , no med change  Overweight (BMI 25.0-29.9)  Patient re-educated about  the importance of commitment to a  minimum of 150 minutes of exercise per week as able.  The importance of healthy food choices with portion control discussed, as well as eating regularly and within a 12 hour window most days. The need to choose "clean , green" food 50 to 75% of the time is discussed, as well as to make water the primary drink and set a goal of 64 ounces water daily.       05/07/2022    4:24 PM 03/31/2022  10:10 AM 03/25/2022    9:16 AM  Weight /BMI  Weight 159 lb 1.9 oz 158 lb 1.9 oz 161 lb  Height '5\' 4"'$  (1.626 m) '5\' 4"'$  (1.626 m) '5\' 4"'$  (1.626 m)  BMI 27.31 kg/m2 27.14 kg/m2 27.64 kg/m2      Left leg pain Controlled, no change in medication

## 2022-05-07 NOTE — Patient Instructions (Signed)
Annual exam end Nov and re eval Blood pressure call if you need me sooner  Flu vaccine today  New additional med for blood pressure is spironolactone 25 mg one daily  Excellent cholesterol, liver and kidney function  Thanks for choosing [facility], we consider it a privelige to serve you.

## 2022-05-07 NOTE — Assessment & Plan Note (Signed)
Improved but still not at goal, add spironolactone 25 mg daily DASH diet and commitment to daily physical activity for a minimum of 30 minutes discussed and encouraged, as a part of hypertension management. The importance of attaining a healthy weight is also discussed.     05/07/2022    4:47 PM 05/07/2022    4:27 PM 05/07/2022    4:24 PM 03/31/2022   10:11 AM 03/31/2022   10:10 AM 03/25/2022    9:16 AM 01/27/2022    9:25 AM  BP/Weight  Systolic BP 284 132 440 102 725 366 440  Diastolic BP 70 88 91 98 347 74 101  Wt. (Lbs)   159.12  158.12 161 150  BMI   27.31 kg/m2  27.14 kg/m2 27.64 kg/m2 25.75 kg/m2

## 2022-06-16 DIAGNOSIS — H25813 Combined forms of age-related cataract, bilateral: Secondary | ICD-10-CM | POA: Diagnosis not present

## 2022-06-16 DIAGNOSIS — H47292 Other optic atrophy, left eye: Secondary | ICD-10-CM | POA: Diagnosis not present

## 2022-06-16 DIAGNOSIS — H524 Presbyopia: Secondary | ICD-10-CM | POA: Diagnosis not present

## 2022-06-16 DIAGNOSIS — H52203 Unspecified astigmatism, bilateral: Secondary | ICD-10-CM | POA: Diagnosis not present

## 2022-06-16 DIAGNOSIS — H31012 Macula scars of posterior pole (postinflammatory) (post-traumatic), left eye: Secondary | ICD-10-CM | POA: Diagnosis not present

## 2022-07-05 ENCOUNTER — Ambulatory Visit (INDEPENDENT_AMBULATORY_CARE_PROVIDER_SITE_OTHER): Payer: PPO

## 2022-07-05 DIAGNOSIS — Z0000 Encounter for general adult medical examination without abnormal findings: Secondary | ICD-10-CM

## 2022-07-05 NOTE — Patient Instructions (Signed)
  Kristen Cox , Thank you for taking time to come for your Medicare Wellness Visit. I appreciate your ongoing commitment to your health goals. Please review the following plan we discussed and let me know if I can assist you in the future.   These are the goals we discussed:  Goals      Exercise 3x per week (30 min per time)     Recommend starting a routine exercise program at least 3 days a week for 30-45 minutes at a time as tolerated.       Patient Stated     Spending time with grand daughter      Patient Stated     Patient stated that her goal is to continue to be able to work on puzzles and read.     Protect My Health     Stay well.         This is a list of the screening recommended for you and due dates:  Health Maintenance  Topic Date Due   Zoster (Shingles) Vaccine (1 of 2) Never done   Tetanus Vaccine  01/31/2021   COVID-19 Vaccine (1) 07/30/2022*   Medicare Annual Wellness Visit  07/06/2023   Pneumonia Vaccine  Completed   Flu Shot  Completed   DEXA scan (bone density measurement)  Completed   HPV Vaccine  Aged Out  *Topic was postponed. The date shown is not the original due date.

## 2022-07-05 NOTE — Progress Notes (Signed)
Subjective:   Kristen Cox is a 81 y.o. female who presents for Medicare Annual (Subsequent) preventive examination. I connected with  ETHELYN CERNIGLIA on 07/05/22 by a audio enabled telemedicine application and verified that I am speaking with the correct person using two identifiers.  Patient Location: Home  Provider Location: Office/Clinic  I discussed the limitations of evaluation and management by telemedicine. The patient expressed understanding and agreed to proceed.  Review of Systems           Objective:    There were no vitals filed for this visit. There is no height or weight on file to calculate BMI.     06/22/2021    3:07 PM 06/19/2020    1:23 PM 12/20/2017   10:00 AM 11/08/2017    5:03 PM 02/14/2017   10:30 AM 05/19/2016   12:02 PM  Advanced Directives  Does Patient Have a Medical Advance Directive? No No No No No No  Would patient like information on creating a medical advance directive? No - Patient declined No - Patient declined Yes (MAU/Ambulatory/Procedural Areas - Information given)  Yes (MAU/Ambulatory/Procedural Areas - Information given) No - patient declined information    Current Medications (verified) Outpatient Encounter Medications as of 07/05/2022  Medication Sig   amLODipine (NORVASC) 10 MG tablet Take 1 tablet (10 mg total) by mouth daily.   aspirin EC 81 MG tablet Take 81 mg by mouth daily.   atorvastatin (LIPITOR) 40 MG tablet TAKE 1 TABLET(40 MG) BY MOUTH DAILY   carvedilol (COREG) 6.25 MG tablet Take 6.25 mg by mouth 2 (two) times daily with a meal.   chlorthalidone (HYGROTON) 25 MG tablet Take 1 tablet (25 mg total) by mouth daily.   cholecalciferol (VITAMIN D3) 25 MCG (1000 UNIT) tablet Take 1,000 Units by mouth daily.   Flaxseed, Linseed, (FLAXSEED OIL) 1200 MG CAPS Take by mouth.   gabapentin (NEURONTIN) 100 MG capsule Take two capsules at bedtime   Polyethyl Glycol-Propyl Glycol 0.4-0.3 % SOLN Apply 1 drop to eye 2 (two) times daily.    No facility-administered encounter medications on file as of 07/05/2022.    Allergies (verified) Benazepril and Metoprolol   History: Past Medical History:  Diagnosis Date   Deep vein thrombosis (DVT) (HCC) 08/2016   Right leg   Dry eyes 2010   on restasis - Dr. Jorja Loa    Gout 2009   Hyperlipidemia    Hypertension    Spinal stenosis    Past Surgical History:  Procedure Laterality Date   CESAREAN SECTION     COLONOSCOPY  04/09/2009   FMB:WGYK papilla otherwise, normal rectum/Left-sided diverticula/Cecal polyp status post cold snare and removal/The remainder of the colonic mucosa appeared normal. Adenomatous polyps, next TCS 03/2016 per RMR.   COLONOSCOPY N/A 05/19/2016   Procedure: COLONOSCOPY;  Surgeon: Daneil Dolin, MD;  Location: [facility];  Service: Endoscopy;  Laterality: N/A;  1:00 PM   Family History  Problem Relation Age of Onset   Stroke Mother    Stroke Father    Diabetes Sister    Pulmonary embolism Sister    Cirrhosis Brother    Colon cancer Neg Hx    Social History   Socioeconomic History   Marital status: Widowed    Spouse name: Not on file   Number of children: 2   Years of education: Not on file   Highest education level: 10th grade  Occupational History   Occupation: retired   Tobacco Use  Smoking status: Former    Packs/day: 0.50    Years: 10.00    Total pack years: 5.00    Types: Cigarettes    Quit date: 10/07/1983    Years since quitting: 38.7   Smokeless tobacco: Never  Vaping Use   Vaping Use: Never used  Substance and Sexual Activity   Alcohol use: No   Drug use: No   Sexual activity: Not Currently    Birth control/protection: Post-menopausal  Other Topics Concern   Not on file  Social History Narrative   Not on file   Social Determinants of Health   Financial Resource Strain: Low Risk  (01/20/2022)   Overall Financial Resource Strain (CARDIA)    Difficulty of Paying Living Expenses: Not hard at all  Food Insecurity:  No Food Insecurity (01/20/2022)   Hunger Vital Sign    Worried About Running Out of Food in the Last Year: Never true    Ran Out of Food in the Last Year: Never true  Transportation Needs: No Transportation Needs (01/20/2022)   PRAPARE - Hydrologist (Medical): No    Lack of Transportation (Non-Medical): No  Physical Activity: Inactive (01/20/2022)   Exercise Vital Sign    Days of Exercise per Week: 0 days    Minutes of Exercise per Session: 0 min  Stress: No Stress Concern Present (01/20/2022)   [place]    Feeling of Stress : Not at all  Social Connections: Socially Isolated (01/20/2022)   Social Connection and Isolation Panel [NHANES]    Frequency of Communication with Friends and Family: More than three times a week    Frequency of Social Gatherings with Friends and Family: Never    Attends Religious Services: Never    Marine scientist or Organizations: No    Attends Archivist Meetings: Never    Marital Status: Widowed    Tobacco Counseling Counseling given: Not Answered   Clinical Intake:                 Diabetic?no         Activities of Daily Living     No data to display          Patient Care Team: Fayrene Helper, MD as PCP - General Fay Records, MD as PCP - Cardiology (Cardiology) Gala Romney Cristopher Estimable, MD as Consulting Physician (Gastroenterology) Cassandria Anger, MD as Consulting Physician (Endocrinology) Florian Buff, MD as Consulting Physician (Obstetrics and Gynecology) Sydnee Cabal, MD as Consulting Physician (Orthopedic Surgery)  Indicate any recent Medical Services you may have received from other than [facility] providers in the past year (date may be approximate).     Assessment:   This is a routine wellness examination for [facility].  Hearing/Vision screen No results found.  Dietary issues and exercise activities  discussed:     Goals Addressed   None    Depression Screen    05/07/2022    4:24 PM 03/31/2022   10:13 AM 01/20/2022    8:40 AM 12/15/2021   10:00 AM 07/14/2021   11:09 AM 06/22/2021    3:07 PM 06/22/2021    3:05 PM  PHQ 2/9 Scores  PHQ - 2 Score 0 0 0 0 0 0 0  PHQ- 9 Score   2        Fall Risk    05/07/2022    4:24 PM 03/31/2022   10:13 AM 01/20/2022  8:36 AM 12/15/2021    9:59 AM 07/14/2021   11:09 AM  Fall Risk   Falls in the past year? 0 1 1 0 0  Number falls in past yr: 0 0 0 0 0  Injury with Fall? 0 1 0 0 0  Risk for fall due to : No Fall Risks Impaired balance/gait History of fall(s) No Fall Risks   Follow up Falls evaluation completed Falls evaluation completed Falls evaluation completed;Education provided Falls evaluation completed     [place]:  Any stairs in or around the home? Yes  If so, are there any without handrails? No  Home free of loose throw rugs in walkways, pet beds, electrical cords, etc? Yes  Adequate lighting in your home to reduce risk of falls? Yes   ASSISTIVE DEVICES UTILIZED TO PREVENT FALLS:  Life alert? No  Use of a cane, walker or w/c? Yes  Grab bars in the bathroom? yes Shower chair or bench in shower? No  Elevated toilet seat or a handicapped toilet? No   TIMED UP AND GO:  Was the test performed? No .  Length of time to ambulate 10 feet:  sec.     Cognitive Function:    06/22/2021    3:08 PM  MMSE - Mini Mental State Exam  Not completed: Unable to complete        06/22/2021    3:08 PM 06/19/2020    1:25 PM 06/19/2019    1:34 PM 12/20/2017   10:03 AM 02/14/2017   10:31 AM  6CIT Screen  What Year? 0 points 0 points 0 points 0 points 0 points  What month? 0 points 0 points 0 points 0 points 0 points  What time? 0 points 0 points 0 points 0 points 0 points  Count back from 20 0 points 2 points 0 points 0 points 0 points  Months in reverse 2 points 0 points 0 points 0 points 0 points   Repeat phrase 0 points 10 points 2 points 0 points 0 points  Total Score 2 points 12 points 2 points 0 points 0 points    Immunizations Immunization History  Administered Date(s) Administered   Fluad Quad(high Dose 65+) 06/06/2019, 07/08/2020, 07/28/2020, 07/14/2021, 05/07/2022   Influenza, High Dose Seasonal PF 06/05/2020   Influenza,inj,Quad PF,6+ Mos 06/12/2014, 08/21/2015, 07/08/2016, 06/23/2017, 04/18/2018   Pneumococcal Conjugate-13 11/13/2014   Pneumococcal Polysaccharide-23 02/17/2016   Tdap 02/01/2011    TDAP status: Due, Education has been provided regarding the importance of this vaccine. Advised may receive this vaccine at local pharmacy or Health Dept. Aware to provide a copy of the vaccination record if obtained from local pharmacy or Health Dept. Verbalized acceptance and understanding.  Flu Vaccine status: Up to date  Pneumococcal vaccine status: Up to date  Covid-19 vaccine status: Information provided on how to obtain vaccines.   Qualifies for Shingles Vaccine? Yes   Zostavax completed No   Shingrix Completed?: No.    Education has been provided regarding the importance of this vaccine. Patient has been advised to call insurance company to determine out of pocket expense if they have not yet received this vaccine. Advised may also receive vaccine at local pharmacy or Health Dept. Verbalized acceptance and understanding.  Screening Tests Health Maintenance  Topic Date Due   Zoster Vaccines- Shingrix (1 of 2) Never done   TETANUS/TDAP  01/31/2021   Medicare Annual Wellness (AWV)  06/22/2022   COVID-19 Vaccine (1) 07/30/2022 (Originally 06/03/1946)  Pneumonia Vaccine 81+ Years old  Completed   INFLUENZA VACCINE  Completed   DEXA SCAN  Completed   HPV VACCINES  Aged Out    Health Maintenance  Health Maintenance Due  Topic Date Due   Zoster Vaccines- Shingrix (1 of 2) Never done   TETANUS/TDAP  01/31/2021   Medicare Annual Wellness (AWV)  06/22/2022     Colorectal cancer screening: No longer required.   Mammogram status: No longer required due to age.  Bone Density status: Completed 05/03/18. Results reflect: Bone density results: NORMAL. Repeat every 0 years.  Lung Cancer Screening: (Low Dose CT Chest recommended if Age 96-80 years, 30 pack-year currently smoking OR have quit w/in 15years.) does not qualify.   Lung Cancer Screening Referral:   Additional Screening:  Hepatitis C Screening: does not qualify; Completed   Vision Screening: Recommended annual ophthalmology exams for early detection of glaucoma and other disorders of the eye. Is the patient up to date with their annual eye exam?  Yes  Who is the provider or what is the name of the office in which the patient attends annual eye exams? Dr Gershon Crane If pt is not established with a provider, would they like to be referred to a provider to establish care? No .   Dental Screening: Recommended annual dental exams for proper oral hygiene  Community Resource Referral / Chronic Care Management: CRR required this visit?  No   CCM required this visit?  No      Plan:     I have personally reviewed and noted the following in the patient's chart:   Medical and social history Use of alcohol, tobacco or illicit drugs  Current medications and supplements including opioid prescriptions. Patient is not currently taking opioid prescriptions. Functional ability and status Nutritional status Physical activity Advanced directives List of other physicians Hospitalizations, surgeries, and ER visits in previous 12 months Vitals Screenings to include cognitive, depression, and falls Referrals and appointments  In addition, I have reviewed and discussed with patient certain preventive protocols, quality metrics, and best practice recommendations. A written personalized care plan for preventive services as well as general preventive health recommendations were provided to patient.      Jill Side, [place]   07/05/2022   Nurse Notes:

## 2022-07-21 ENCOUNTER — Encounter: Payer: PPO | Admitting: Family Medicine

## 2022-07-28 ENCOUNTER — Encounter: Payer: Self-pay | Admitting: Family Medicine

## 2022-07-28 ENCOUNTER — Ambulatory Visit (INDEPENDENT_AMBULATORY_CARE_PROVIDER_SITE_OTHER): Payer: PPO | Admitting: Family Medicine

## 2022-07-28 ENCOUNTER — Ambulatory Visit (HOSPITAL_COMMUNITY)
Admission: RE | Admit: 2022-07-28 | Discharge: 2022-07-28 | Disposition: A | Discharge status: Home / Self Care | Payer: PPO | Source: Ambulatory Visit | Attending: Family Medicine | Admitting: Family Medicine

## 2022-07-28 VITALS — BP 144/80 | HR 89

## 2022-07-28 DIAGNOSIS — J209 Acute bronchitis, unspecified: Secondary | ICD-10-CM | POA: Diagnosis not present

## 2022-07-28 DIAGNOSIS — R059 Cough, unspecified: Secondary | ICD-10-CM | POA: Diagnosis not present

## 2022-07-28 MED ORDER — BENZONATATE 100 MG PO CAPS: 100.0000 mg | ORAL_CAPSULE | Freq: Two times a day (BID) | ORAL | 0 refills | Status: DC | PRN

## 2022-07-28 MED ORDER — PREDNISONE 5 MG PO TABS
5.0000 mg | ORAL_TABLET | Freq: Two times a day (BID) | ORAL | 0 refills | Status: AC
Start: 2022-07-28 — End: 2022-08-02

## 2022-07-28 MED ORDER — AZITHROMYCIN 250 MG PO TABS
ORAL_TABLET | ORAL | 0 refills | Status: AC
Start: 2022-07-28 — End: 2022-08-02

## 2022-07-28 NOTE — Progress Notes (Signed)
Virtual Visit via Telephone Note  I connected with Kristen Cox on 07/28/22 at  4:40 PM EST by telephone and verified that I am speaking with the correct person using two identifiers.  Location: Patient: home Provider: office   I discussed the limitations, risks, security and privacy concerns of performing an evaluation and management service by telephone and the availability of in person appointments. I also discussed with the patient that there may be a patient responsible charge related to this service. The patient expressed understanding and agreed to proceed.   History of Present Illness:   1 week h/o chest congestion cough, clear  sputum, started last Friday, has  cataract surgery next wed  Observations/Objective: Good communication with no confusion and intact memory. Cough and chest congestion heard during interview   Assessment and Plan:  Acute bronchitis Z pack, tessalon perle , CXR  Follow Up Instructions:    I discussed the assessment and treatment plan with the patient. The patient was provided an opportunity to ask questions and all were answered. The patient agreed with the plan and demonstrated an understanding of the instructions.   The patient was advised to call back or seek an in-person evaluation if the symptoms worsen or if the condition fails to improve as anticipated.  I provided 12 minutes of non-face-to-face time during this encounter.   Tula Nakayama, MD

## 2022-07-28 NOTE — Patient Instructions (Addendum)
F/U as before, call if you need me sooner  You are treated for acute bronchitis, tessalon perles, azithromycin and prednisone are prescribed.  CXR is ordered, please get this today  All the best with cataract surgery next week  Thanks for choosing [facility], we consider it a privelige to serve you.

## 2022-07-29 ENCOUNTER — Telehealth: Payer: Self-pay | Admitting: Family Medicine

## 2022-07-29 NOTE — Telephone Encounter (Signed)
Patient returning nurse call

## 2022-07-29 NOTE — Telephone Encounter (Signed)
Patient aware of chest xray results.

## 2022-08-01 ENCOUNTER — Encounter: Payer: Self-pay | Admitting: Family Medicine

## 2022-08-01 DIAGNOSIS — J209 Acute bronchitis, unspecified: Secondary | ICD-10-CM | POA: Insufficient documentation

## 2022-08-01 NOTE — Assessment & Plan Note (Signed)
Z pack, tessalon perle , CXR

## 2022-08-12 ENCOUNTER — Encounter: Payer: Self-pay | Admitting: Family Medicine

## 2022-08-12 ENCOUNTER — Ambulatory Visit (INDEPENDENT_AMBULATORY_CARE_PROVIDER_SITE_OTHER): Payer: PPO | Admitting: Family Medicine

## 2022-08-12 VITALS — BP 142/78 | HR 101 | Ht 64.0 in | Wt 163.0 lb

## 2022-08-12 DIAGNOSIS — J309 Allergic rhinitis, unspecified: Secondary | ICD-10-CM

## 2022-08-12 DIAGNOSIS — Z0001 Encounter for general adult medical examination with abnormal findings: Secondary | ICD-10-CM

## 2022-08-12 DIAGNOSIS — R053 Chronic cough: Secondary | ICD-10-CM | POA: Diagnosis not present

## 2022-08-12 DIAGNOSIS — R058 Other specified cough: Secondary | ICD-10-CM

## 2022-08-12 MED ORDER — DM-GUAIFENESIN ER 30-600 MG PO TB12: 1.0000 | ORAL_TABLET | Freq: Two times a day (BID) | ORAL | 0 refills | Status: AC

## 2022-08-12 MED ORDER — CEPHALEXIN 500 MG PO CAPS: 500.0000 mg | ORAL_CAPSULE | Freq: Three times a day (TID) | ORAL | 0 refills | Status: DC

## 2022-08-12 MED ORDER — METHYLPREDNISOLONE ACETATE 80 MG/ML IJ SUSP
40.0000 mg | Freq: Once | INTRAMUSCULAR | Status: AC
  Administered 2022-08-12: 40 mg via INTRAMUSCULAR

## 2022-08-12 MED ORDER — CEFTRIAXONE SODIUM 500 MG IJ SOLR
500.0000 mg | Freq: Once | INTRAMUSCULAR | Status: AC
  Administered 2022-08-12: 500 mg via INTRAMUSCULAR

## 2022-08-12 MED ORDER — MONTELUKAST SODIUM 10 MG PO TABS: 10.0000 mg | ORAL_TABLET | Freq: Every day | ORAL | 2 refills | Status: DC

## 2022-08-12 NOTE — Patient Instructions (Addendum)
F/U in 4 weeks , re evaluate cough, call if you need me sooner  You are referred for chest scan due to chronic cough  Rocephin 500 mg IM and depo medrol 40 mg IM in th e office today  You have chronic  bronchitis and uncontrolled nasal allergies and congestion   Keflex , an antibiotic,  is prescribed for 1 week , also mucinex DM one twice daily for 5 days.  Continue once daily claritin, take every day Montelukast one daily is added and you need this every day for cough and allergies, take at bedtime  I recommend putting off eye surgery for an additional 3 to 4 weeks  Thanks for choosing [facility], we consider it a privelige to serve you.

## 2022-08-17 ENCOUNTER — Encounter: Payer: Self-pay | Admitting: Family Medicine

## 2022-08-17 DIAGNOSIS — R053 Chronic cough: Secondary | ICD-10-CM | POA: Insufficient documentation

## 2022-08-17 DIAGNOSIS — Z0001 Encounter for general adult medical examination with abnormal findings: Secondary | ICD-10-CM | POA: Insufficient documentation

## 2022-08-17 NOTE — Assessment & Plan Note (Signed)

## 2022-08-17 NOTE — Progress Notes (Signed)
    Kristen Cox     MRN: [id number]      DOB: 05-02-1941  HPI: Patient is in for annual physical exam. Reports worsening and persistent cough and chest congestion since 12/06, despite antibiotic treatment, feels tired and weak due to cough disturbing sleep Recent labs,  are reviewed. Immunization is reviewed , and  updated if needed.   PE: BP (!) 142/78 (BP Location: Right Arm, Patient Position: Sitting, Cuff Size: Normal)   Pulse (!) 101   Ht '5\' 4"'$  (1.626 m)   Wt 163 lb (73.9 kg)   SpO2 95%   BMI 27.98 kg/m   Pleasant  female, alert and oriented x 3, in no cardio-pulmonary distress. Afebrile. HEENT No facial trauma or asymetry. Sinuses non tender.  Extra occullar muscles intact.. External ears normal, . Neck: decreased ROM,  no adenopathy,JVD or thyromegaly.No bruits.  Chest: Decreased aor entry scattered crackles , few wheezes. Non tender to palpation  Cardiovascular system; Heart sounds normal,  S1 and  S2 ,no S3.   Abdomen: Soft, non tender,      Musculoskeletal exam: Decreased  ROM of spine, hips , shoulders and knees.  deformity ,swelling or crepitus noted. No muscle wasting or atrophy.   Neurologic: Cranial nerves 2 to 12 intact. Power, tone ,sensation  normal throughout.  disturbance in gait. No tremor.  Skin: Intact, no ulceration, erythema , scaling or rash noted. Pigmentation normal throughout  Psych; Normal mood and affect. Judgement and concentration normal   Assessment & Plan:  Chronic cough Persistent cough with sputum, x 2weeks, despite anitbioitic treatment, needs chest scan, Rocephin 500 mg iM and depo medrol 40 mg in in office followed by  Keflex, and mucinex DM  Allergic rhinitis Uncontrolled , continue daily claritin and add daily montelukast  Annual visit for general adult medical examination with abnormal findings Annual exam as documented. Counseling done  re healthy lifestyle involving commitment to 150 minutes exercise per  week, heart healthy diet, and attaining healthy weight.The importance of adequate sleep also discussed. Regular seat belt use and home safety, is also discussed. Changes in health habits are decided on by the patient with goals and time frames  set for achieving them. Immunization and cancer screening needs are specifically addressed at this visit.

## 2022-08-17 NOTE — Assessment & Plan Note (Signed)
Uncontrolled , continue daily claritin and add daily montelukast

## 2022-08-17 NOTE — Assessment & Plan Note (Addendum)
Persistent cough with sputum, x 2weeks, despite anitbioitic treatment, needs chest scan, Rocephin 500 mg iM and depo medrol 40 mg in in office followed by  Keflex, and mucinex DM

## 2022-08-26 ENCOUNTER — Ambulatory Visit (HOSPITAL_COMMUNITY)
Admission: RE | Admit: 2022-08-26 | Discharge: 2022-08-26 | Disposition: A | Discharge status: Home / Self Care | Payer: PPO | Source: Ambulatory Visit | Attending: Family Medicine | Admitting: Family Medicine

## 2022-08-26 DIAGNOSIS — R053 Chronic cough: Secondary | ICD-10-CM | POA: Insufficient documentation

## 2022-08-26 DIAGNOSIS — J479 Bronchiectasis, uncomplicated: Secondary | ICD-10-CM | POA: Diagnosis not present

## 2022-08-26 DIAGNOSIS — R918 Other nonspecific abnormal finding of lung field: Secondary | ICD-10-CM | POA: Diagnosis not present

## 2022-09-14 ENCOUNTER — Encounter: Payer: Self-pay | Admitting: Family Medicine

## 2022-09-14 ENCOUNTER — Ambulatory Visit (INDEPENDENT_AMBULATORY_CARE_PROVIDER_SITE_OTHER): Payer: PPO | Admitting: Family Medicine

## 2022-09-14 VITALS — BP 138/74 | HR 96 | Ht 64.0 in | Wt 162.1 lb

## 2022-09-14 DIAGNOSIS — J9859 Other diseases of mediastinum, not elsewhere classified: Secondary | ICD-10-CM

## 2022-09-14 DIAGNOSIS — J209 Acute bronchitis, unspecified: Secondary | ICD-10-CM

## 2022-09-14 DIAGNOSIS — E559 Vitamin D deficiency, unspecified: Secondary | ICD-10-CM | POA: Diagnosis not present

## 2022-09-14 DIAGNOSIS — Z13220 Encounter for screening for lipoid disorders: Secondary | ICD-10-CM

## 2022-09-14 DIAGNOSIS — I10 Essential (primary) hypertension: Secondary | ICD-10-CM

## 2022-09-14 DIAGNOSIS — E7800 Pure hypercholesterolemia, unspecified: Secondary | ICD-10-CM | POA: Diagnosis not present

## 2022-09-14 DIAGNOSIS — M25562 Pain in left knee: Secondary | ICD-10-CM | POA: Diagnosis not present

## 2022-09-14 NOTE — Patient Instructions (Addendum)
Follow-up second week in May, call if you need me sooner.  Please schedule appointment with Dr. Court Joy at checkout for management of arthritic pain in left knee requesting injection.  Thankful that you are feeling much better.  Please schedule follow-up chest scan for the last week in April of first week in May.  Please get fasting CBC lipid CMP and EGFR TSH and vitamin D 3 to 5 days before your May appointment.  Safe to take Tylenol arthritis up to 2 tablets daily for pain.  Week in April or first week in May, sorry, 3 to 6 months after Jan one

## 2022-09-14 NOTE — Assessment & Plan Note (Signed)
Needs follow-up scan in 4 months.

## 2022-09-14 NOTE — Assessment & Plan Note (Addendum)
Established severe  osteoarthritis in knee, refer to Dr Court Joy for intra articular injection/ any management deemed appropriate

## 2022-09-15 ENCOUNTER — Telehealth: Payer: Self-pay | Admitting: Family Medicine

## 2022-09-15 NOTE — Telephone Encounter (Signed)
Wants to know if it is ok for her to call Gershon Crane and schedule her eye surgery now?

## 2022-09-16 NOTE — Telephone Encounter (Signed)
Patient aware.

## 2022-09-20 ENCOUNTER — Encounter: Payer: Self-pay | Admitting: Family Medicine

## 2022-09-20 NOTE — Assessment & Plan Note (Signed)
Updated lab needed at/ before next visit.   

## 2022-09-20 NOTE — Assessment & Plan Note (Signed)
Resolved, asymptomatic, chest scan shows no infection  in 08/2022

## 2022-09-20 NOTE — Progress Notes (Signed)
   Kristen Cox     MRN: [id number]      DOB: 1941-04-27   HPI Kristen Cox is here for follow up and re-evaluation of chronic medical conditions, medication management and review of any available recent lab and radiology data.  Preventive health is updated, specifically  Cancer screening and Immunization.   Questions or concerns regarding consultations or procedures which the PT has had in the interim are  addressed. Recent chest scan demonstrated mediastinal mass with close f/u to ensure stability.Needs to be scheduled. The PT denies any adverse reactions to current medications since the last visit.  Feels much better as far as her coughing is concerned C/o left anterior knee pain has established severe osteo,requests intra articular injection will refer    ROS Denies recent fever or chills. Denies sinus pressure, nasal congestion, ear pain or sore throat. Denies chest congestion, productive cough or wheezing. Denies chest pains, palpitations and leg swelling Denies abdominal pain, nausea, vomiting,diarrhea or constipation.   Denies dysuria, frequency, hesitancy or incontinence. Denies headaches, seizures, Denies depression, anxiety or insomnia. Denies skin break down or rash.   PE  BP 138/74   Pulse 96   Ht '5\' 4"'$  (1.626 m)   Wt 162 lb 1.9 oz (73.5 kg)   SpO2 96%   BMI 27.83 kg/m   Patient alert and oriented and in no cardiopulmonary distress.  HEENT: No facial asymmetry, EOMI,     Neck supple .  Chest: Clear to auscultation bilaterally.  CVS: S1, S2 no murmurs, no S3.Regular rate.  ABD: Soft non tender.   Ext: No edema  MS: Adequate ROM spine, shoulders, hips an decreased in knees.left greater than right  Skin: Intact, no ulcerations or rash noted.  Psych: Good eye contact, normal affect. Memory intact not anxious or depressed appearing.  CNS: CN 2-12 intact, power,  normal throughout.no focal deficits noted.   Assessment & Plan  Mediastinal mass Needs  follow-up scan in 4 months.  Left anterior knee pain Established severe  osteoarthritis in knee, refer to Dr Court Joy for intra articular injection/ any management deemed appropriate  Essential hypertension Controlled, no change in medication DASH diet and commitment to daily physical activity for a minimum of 30 minutes discussed and encouraged, as a part of hypertension management. The importance of attaining a healthy weight is also discussed.     09/14/2022    9:00 AM 09/14/2022    8:37 AM 09/14/2022    8:35 AM 08/12/2022    9:03 AM 08/12/2022    9:01 AM 07/28/2022   12:14 PM 05/07/2022    4:47 PM  BP/Weight  Systolic BP 010 272 536 644 [phone]  Diastolic BP 74 85 82 78 81 80 70  Wt. (Lbs)   162.12  163    BMI   27.83 kg/m2  27.98 kg/m2         Acute bronchitis Resolved, asymptomatic, chest scan shows no infection  in 08/2022  Hyperlipemia Hyperlipidemia:Low fat diet discussed and encouraged. Controlled, no change in medication Updated lab needed at/ before next visit.    Lipid Panel  Lab Results  Component Value Date   CHOL 148 04/28/2022   HDL 55 04/28/2022   LDLCALC 78 04/28/2022   TRIG 75 04/28/2022   CHOLHDL 2.7 04/28/2022     \  Vitamin D deficiency Updated lab needed at/ before next visit.

## 2022-09-20 NOTE — Assessment & Plan Note (Signed)
Controlled, no change in medication DASH diet and commitment to daily physical activity for a minimum of 30 minutes discussed and encouraged, as a part of hypertension management. The importance of attaining a healthy weight is also discussed.     09/14/2022    9:00 AM 09/14/2022    8:37 AM 09/14/2022    8:35 AM 08/12/2022    9:03 AM 08/12/2022    9:01 AM 07/28/2022   12:14 PM 05/07/2022    4:47 PM  BP/Weight  Systolic BP 159 458 592 924 [phone]  Diastolic BP 74 85 82 78 81 80 70  Wt. (Lbs)   162.12  163    BMI   27.83 kg/m2  27.98 kg/m2

## 2022-09-20 NOTE — Assessment & Plan Note (Signed)
Hyperlipidemia:Low fat diet discussed and encouraged. Controlled, no change in medication Updated lab needed at/ before next visit.    Lipid Panel  Lab Results  Component Value Date   CHOL 148 04/28/2022   HDL 55 04/28/2022   LDLCALC 78 04/28/2022   TRIG 75 04/28/2022   CHOLHDL 2.7 04/28/2022     \

## 2022-09-23 ENCOUNTER — Other Ambulatory Visit: Payer: Self-pay | Admitting: Family Medicine

## 2022-09-27 ENCOUNTER — Encounter: Payer: Self-pay | Admitting: Internal Medicine

## 2022-09-27 ENCOUNTER — Ambulatory Visit (INDEPENDENT_AMBULATORY_CARE_PROVIDER_SITE_OTHER): Payer: PPO | Admitting: Internal Medicine

## 2022-09-27 VITALS — BP 156/82 | HR 92 | Ht 63.0 in | Wt 164.1 lb

## 2022-09-27 DIAGNOSIS — M170 Bilateral primary osteoarthritis of knee: Secondary | ICD-10-CM | POA: Insufficient documentation

## 2022-09-27 DIAGNOSIS — M1712 Unilateral primary osteoarthritis, left knee: Secondary | ICD-10-CM

## 2022-09-27 NOTE — Progress Notes (Signed)
   HPI:Kristen Cox is a 82 y.o. female who presents for evaluation of Knee Pain (LT knee/ requesting injection today) and Injections (LT knee/) . For the details of today's visit, please refer to the assessment and plan.  Physical Exam: Vitals:   09/27/22 0826  BP: (!) 156/82  Pulse: 92  SpO2: 95%  Weight: 164 lb 1.9 oz (74.4 kg)  Height: '5\' 3"'$  (1.6 m)   Gen: Elderly female. Well kept. Using cane for ambulation. Slow to stand from chair.  Knee: - Inspection: No gross deformity. Small effusion seen with ultrasound.  No erythema or bruising. Skin intact - Palpation: no TTP - ROM: full active ROM with flexion and extension in knee - Strength: 5/5 strength - Neuro/vasc: NV intact    Assessment & Plan:   Osteoarthritis of left knee Patient presents with left knee pain. Hx of osteoarthritis of both knees. This is chronic pain and no acute injury or changes to knee. She has swelling of her knees occasionally and uses a cane to ambulate. Xray from 01/27/2022 reviewed showing grade 3 to 4 osteoarthritis.   Assessment/Plan: Chronic illness with exacerbation Your pain is due to arthritis. These are the different medications you can take for this: Tylenol '500mg'$  1-2 tabs three times a day for pain. Capsaicin, aspercreme, or biofreeze topically up to four times a day may also help with pain. It's important that you continue to stay active. Straight leg raises, knee extensions 3 sets of 10 once a day (add ankle weight if these become too easy). Consider physical therapy to strengthen muscles around the joint that hurts to take pressure off of the joint itself. Heat or ice 15 minutes at a time 3-4 times a day as needed to help with pain. Water aerobics and cycling with low resistance are the best two types of exercise for arthritis though any exercise is ok as long as it doesn't worsen the pain. Steroid injection today. Follow up with [place] if your knee becomes hot , red , or swollen.    Aspiration/Injection Procedure Note Kristen Cox 10-01-1940  Procedure: Aspiration and Injection Indications: Left knee pain, osteoarthritis of left knee  Procedure Details Consent: Risks of procedure as well as the alternatives and risks of each were explained to the patient.  Consent for procedure obtained. Time Out: Verified patient identification, verified procedure, site/side was marked, verified correct patient position, special equipment, medications/allergies/relevent history reviewed, required imaging and test results available.  Performed.  The area was cleaned with iodine and alcohol swabs.    The left knee superior lateral suprapatellar pouch was injected using 1 cc of 1% lidocaine on a 22-gauge 1-1/2 inch needle.  An 18-gauge 1-1/2 inch needle was used to achieve aspiration.  The syringe was switched to mixture containing 1 cc's of 40 mg Kenalog and 4 cc's of 1% lidocaine was injected.  Ultrasound was used. Images were obtained in long views showing the injection.    Amount of Fluid Aspirated: 27m Character of Fluid: straw colored A sterile dressing was applied.  Patient did tolerate procedure well.        JLorene Dy MD

## 2022-09-27 NOTE — Assessment & Plan Note (Addendum)
Patient presents with left knee pain. Hx of osteoarthritis of both knees. This is chronic pain and no acute injury or changes to knee. She has swelling of her knees occasionally and uses a cane to ambulate. Xray from 01/27/2022 reviewed showing grade 3 to 4 osteoarthritis.   Assessment/Plan: Chronic illness with exacerbation Your pain is due to arthritis. These are the different medications you can take for this: Tylenol '500mg'$  1-2 tabs three times a day for pain. Capsaicin, aspercreme, or biofreeze topically up to four times a day may also help with pain. It's important that you continue to stay active. Straight leg raises, knee extensions 3 sets of 10 once a day (add ankle weight if these become too easy). Consider physical therapy to strengthen muscles around the joint that hurts to take pressure off of the joint itself. Heat or ice 15 minutes at a time 3-4 times a day as needed to help with pain. Water aerobics and cycling with low resistance are the best two types of exercise for arthritis though any exercise is ok as long as it doesn't worsen the pain. Steroid injection today. Follow up with [place] if your knee becomes hot , red , or swollen.   Aspiration/Injection Procedure Note Kristen Cox [birth year]  Procedure: Aspiration and Injection Indications: Left knee pain, osteoarthritis of left knee  Procedure Details Consent: Risks of procedure as well as the alternatives and risks of each were explained to the patient.  Consent for procedure obtained. Time Out: Verified patient identification, verified procedure, site/side was marked, verified correct patient position, special equipment, medications/allergies/relevent history reviewed, required imaging and test results available.  Performed.  The area was cleaned with iodine and alcohol swabs.    The left knee superior lateral suprapatellar pouch was injected using 1 cc of 1% lidocaine on a 22-gauge 1-1/2 inch needle.  An 18-gauge 1-1/2 inch  needle was used to achieve aspiration.  The syringe was switched to mixture containing 1 cc's of 40 mg Kenalog and 4 cc's of 1% lidocaine was injected.  Ultrasound was used. Images were obtained in long views showing the injection.    Amount of Fluid Aspirated: 25m Character of Fluid: straw colored A sterile dressing was applied.  Patient did tolerate procedure well.

## 2022-09-27 NOTE — Patient Instructions (Addendum)
Thank you for trusting me with your care. To recap, today we discussed the following:   Your pain is due to arthritis. These are the different medications you can take for this: Tylenol '500mg'$  1-2 tabs three times a day for pain. Capsaicin, aspercreme, or biofreeze topically up to four times a day may also help with pain.  It's important that you continue to stay active. Straight leg raises, knee extensions 3 sets of 10 once a day (add ankle weight if these become too easy). Consider physical therapy to strengthen muscles around the joint that hurts to take pressure off of the joint itself. Heat or ice 15 minutes at a time 3-4 times a day as needed to help with pain. Water aerobics and cycling with low resistance are the best two types of exercise for arthritis though any exercise is ok as long as it doesn't worsen the pain.   You received a steroid injection today. Follow up with [place] if your knee becomes hot , red , or swollen. I do not expect this.

## 2022-10-11 ENCOUNTER — Ambulatory Visit (HOSPITAL_COMMUNITY): Payer: PPO

## 2022-10-27 DIAGNOSIS — Z961 Presence of intraocular lens: Secondary | ICD-10-CM | POA: Diagnosis not present

## 2022-10-27 DIAGNOSIS — H2511 Age-related nuclear cataract, right eye: Secondary | ICD-10-CM | POA: Diagnosis not present

## 2022-11-10 DIAGNOSIS — Z961 Presence of intraocular lens: Secondary | ICD-10-CM | POA: Diagnosis not present

## 2022-11-16 ENCOUNTER — Other Ambulatory Visit: Payer: Self-pay | Admitting: Family Medicine

## 2022-11-16 DIAGNOSIS — J309 Allergic rhinitis, unspecified: Secondary | ICD-10-CM

## 2022-11-25 DIAGNOSIS — Z961 Presence of intraocular lens: Secondary | ICD-10-CM | POA: Diagnosis not present

## 2022-12-06 DIAGNOSIS — B351 Tinea unguium: Secondary | ICD-10-CM | POA: Diagnosis not present

## 2022-12-06 DIAGNOSIS — I739 Peripheral vascular disease, unspecified: Secondary | ICD-10-CM | POA: Diagnosis not present

## 2022-12-10 ENCOUNTER — Other Ambulatory Visit: Payer: Self-pay | Admitting: Internal Medicine

## 2022-12-28 DIAGNOSIS — I10 Essential (primary) hypertension: Secondary | ICD-10-CM | POA: Diagnosis not present

## 2022-12-28 DIAGNOSIS — E559 Vitamin D deficiency, unspecified: Secondary | ICD-10-CM | POA: Diagnosis not present

## 2022-12-28 DIAGNOSIS — Z13220 Encounter for screening for lipoid disorders: Secondary | ICD-10-CM | POA: Diagnosis not present

## 2022-12-29 LAB — VITAMIN D 25 HYDROXY (VIT D DEFICIENCY, FRACTURES): Vit D, 25-Hydroxy: 85.3 ng/mL (ref 30.0–100.0)

## 2022-12-29 LAB — CMP14+EGFR
ALT: 15 IU/L (ref 0–32)
AST: 16 IU/L (ref 0–40)
Albumin/Globulin Ratio: 1.5 (ref 1.2–2.2)
Albumin: 3.9 g/dL (ref 3.7–4.7)
Alkaline Phosphatase: 74 IU/L (ref 44–121)
BUN/Creatinine Ratio: 13 (ref 12–28)
BUN: 10 mg/dL (ref 8–27)
Bilirubin Total: 0.6 mg/dL (ref 0.0–1.2)
CO2: 21 mmol/L (ref 20–29)
Calcium: 10.3 mg/dL (ref 8.7–10.3)
Chloride: 105 mmol/L (ref 96–106)
Creatinine, Ser: 0.77 mg/dL (ref 0.57–1.00)
Globulin, Total: 2.6 g/dL (ref 1.5–4.5)
Glucose: 108 mg/dL — ABNORMAL HIGH (ref 70–99)
Potassium: 4.3 mmol/L (ref 3.5–5.2)
Sodium: 142 mmol/L (ref 134–144)
Total Protein: 6.5 g/dL (ref 6.0–8.5)
eGFR: 77 mL/min/{1.73_m2} (ref >59)

## 2022-12-29 LAB — CBC
Hematocrit: 34.8 % (ref 34.0–46.6)
Hemoglobin: 11.4 g/dL (ref 11.1–15.9)
MCH: 35.1 pg — ABNORMAL HIGH (ref 26.6–33.0)
MCHC: 32.8 g/dL (ref 31.5–35.7)
MCV: 107 fL — ABNORMAL HIGH (ref 79–97)
Platelets: 278 10*3/uL (ref 150–450)
RBC: 3.25 x10E6/uL — ABNORMAL LOW (ref 3.77–5.28)
RDW: 13.5 % (ref 11.7–15.4)
WBC: 5.2 10*3/uL (ref 3.4–10.8)

## 2022-12-29 LAB — LIPID PANEL
Chol/HDL Ratio: 3.4 ratio (ref 0.0–4.4)
Cholesterol, Total: 150 mg/dL (ref 100–199)
HDL: 44 mg/dL (ref >39)
LDL Chol Calc (NIH): 88 mg/dL (ref 0–99)
Triglycerides: 98 mg/dL (ref 0–149)
VLDL Cholesterol Cal: 18 mg/dL (ref 5–40)

## 2022-12-29 LAB — TSH: TSH: 1.26 u[IU]/mL (ref 0.450–4.500)

## 2023-01-04 ENCOUNTER — Ambulatory Visit (INDEPENDENT_AMBULATORY_CARE_PROVIDER_SITE_OTHER): Payer: PPO | Admitting: Family Medicine

## 2023-01-04 ENCOUNTER — Encounter: Payer: Self-pay | Admitting: Family Medicine

## 2023-01-04 VITALS — BP 160/80 | HR 96 | Ht 63.0 in | Wt 164.1 lb

## 2023-01-04 DIAGNOSIS — M1712 Unilateral primary osteoarthritis, left knee: Secondary | ICD-10-CM

## 2023-01-04 DIAGNOSIS — E7800 Pure hypercholesterolemia, unspecified: Secondary | ICD-10-CM | POA: Diagnosis not present

## 2023-01-04 DIAGNOSIS — I10 Essential (primary) hypertension: Secondary | ICD-10-CM

## 2023-01-04 DIAGNOSIS — E559 Vitamin D deficiency, unspecified: Secondary | ICD-10-CM | POA: Diagnosis not present

## 2023-01-04 MED ORDER — AMLODIPINE BESYLATE 5 MG PO TABS: 5.0000 mg | ORAL_TABLET | Freq: Every day | ORAL | 2 refills | Status: DC

## 2023-01-04 MED ORDER — CARVEDILOL 12.5 MG PO TABS: 12.5000 mg | ORAL_TABLET | Freq: Two times a day (BID) | ORAL | 3 refills | Status: DC

## 2023-01-04 NOTE — Assessment & Plan Note (Signed)
Current flare will receive injection in office today by dr Barbaraann Faster

## 2023-01-04 NOTE — Assessment & Plan Note (Signed)
Hyperlipidemia:Low fat diet discussed and encouraged.   Lipid Panel  Lab Results  Component Value Date   CHOL 150 12/28/2022   HDL 44 12/28/2022   LDLCALC 88 12/28/2022   TRIG 98 12/28/2022   CHOLHDL 3.4 12/28/2022     Controlled, no change in medication

## 2023-01-04 NOTE — Patient Instructions (Addendum)
F/U in 4 to 5  weeks, re evaluate blood pressure, call if you need me sooner  NEW HIGHER dose of carvedilol , start today, 12.5 mg one twice daily Start today amlodipine 5 mg one daily Both are for your blood pressure and both are new at the pharmacy. Nurse please speak with the pharmacy staff directley as there are 2 med changes  Reduce vitamin D to 2000 units Monday through Friday only take only for 5 days/week.  When this is finished your new dose of vitamin D is 1000 units every day.  Blood work is excellent, your blood count is normal you have normal thyroid liver and kidney function.  Your cholesterol is excellent.  Be careful not to fall.  You will be seen by Dr. Barbaraann Faster in the office today for management of your joint pain  Be careful not to fall  Thanks for choosing [facility], we consider it a privelige to serve you.

## 2023-01-04 NOTE — Assessment & Plan Note (Signed)
Uncontrolled, inc coreg and resume amlodipine but at lower dose , re eval in 4 to 5 weeks DASH diet and commitment to daily physical activity for a minimum of 30 minutes discussed and encouraged, as a part of hypertension management. The importance of attaining a healthy weight is also discussed.     01/04/2023    8:57 AM 01/04/2023    8:39 AM 01/04/2023    8:37 AM 09/27/2022    8:26 AM 09/14/2022    9:00 AM 09/14/2022    8:37 AM 09/14/2022    8:35 AM  BP/Weight  Systolic BP 160 165 160 156 138 139 153  Diastolic BP 80 89 80 82 74 85 82  Wt. (Lbs)   164.12 164.12   162.12  BMI   29.07 kg/m2 29.07 kg/m2   27.83 kg/m2

## 2023-01-04 NOTE — Assessment & Plan Note (Signed)
Ereduce vit D3 , 2000 iU to 5 days pwer week, then start 1000 iU daily

## 2023-01-04 NOTE — Progress Notes (Signed)
   Kristen Cox     MRN: [id number]      DOB: 1940/10/01  Chief Complaint  Patient presents with   Follow-up    Knee and ankle swelling     HPI Kristen Cox is here for follow up and re-evaluation of chronic medical conditions, medication management and review of any available recent lab and radiology data.  Preventive health is updated, specifically  Cancer screening and Immunization.   2 week h/o increazssed and uncontrolled bilateral knee pain, left greater than right, also knees buckle,. The PT denies any adverse reactions to current medications since the last visit.  There are no new concerns.  There are no specific complaints   ROS Denies recent fever or chills. Denies sinus pressure, nasal congestion, ear pain or sore throat. Denies chest congestion, productive cough or wheezing. Denies chest pains, palpitations and leg swelling Denies abdominal pain, nausea, vomiting,diarrhea or constipation.   Denies dysuria, frequency, hesitancy or incontinence.  Denies headaches, seizures, numbness, or tingling. Denies depression, anxiety c/o  insomnia. Denies skin break down or rash.   PE  BP (!) 160/80   Pulse 96   Ht 5\' 3"  (1.6 m)   Wt 164 lb 1.9 oz (74.4 kg)   SpO2 95%   BMI 29.07 kg/m    Patient alert and oriented and in no cardiopulmonary distress.  HEENT: No facial asymmetry, EOMI,     Neck supple .  Chest: Clear to auscultation bilaterally.  CVS: S1, S2 no murmurs, no S3.Regular rate.  ABD: Soft non tender.   Ext: No edema  MS: Adequate ROM spine, shoulders, hips and markedly decreased rOM left knee with tenderness and swelling medially Skin: Intact, no ulcerations or rash noted.  Psych: Good eye contact, normal affect. Memory intact not anxious or depressed appearing.  CNS: CN 2-12 intact, power,  normal throughout.no focal deficits noted.   Assessment & Plan  Essential hypertension Uncontrolled, inc coreg and resume amlodipine but at lower dose , re  eval in 4 to 5 weeks DASH diet and commitment to daily physical activity for a minimum of 30 minutes discussed and encouraged, as a part of hypertension management. The importance of attaining a healthy weight is also discussed.     01/04/2023    8:57 AM 01/04/2023    8:39 AM 01/04/2023    8:37 AM 09/27/2022    8:26 AM 09/14/2022    9:00 AM 09/14/2022    8:37 AM 09/14/2022    8:35 AM  BP/Weight  Systolic BP 160 165 160 156 138 139 153  Diastolic BP 80 89 80 82 74 85 82  Wt. (Lbs)   164.12 164.12   162.12  BMI   29.07 kg/m2 29.07 kg/m2   27.83 kg/m2       Osteoarthritis of left knee Current flare will receive injection in office today by dr Barbaraann Faster  Hyperlipemia Hyperlipidemia:Low fat diet discussed and encouraged.   Lipid Panel  Lab Results  Component Value Date   CHOL 150 12/28/2022   HDL 44 12/28/2022   LDLCALC 88 12/28/2022   TRIG 98 12/28/2022   CHOLHDL 3.4 12/28/2022     Controlled, no change in medication   Vitamin D deficiency Ereduce vit D3 , 2000 iU to 5 days pwer week, then start 1000 iU daily

## 2023-01-04 NOTE — Addendum Note (Signed)
Addended by: Abner Greenspan on: 01/04/2023 03:31 PM   Modules accepted: Orders

## 2023-01-05 ENCOUNTER — Ambulatory Visit (HOSPITAL_COMMUNITY)
Admission: RE | Admit: 2023-01-05 | Discharge: 2023-01-05 | Disposition: A | Discharge status: Home / Self Care | Payer: PPO | Source: Ambulatory Visit | Attending: Family Medicine | Admitting: Family Medicine

## 2023-01-05 DIAGNOSIS — J9859 Other diseases of mediastinum, not elsewhere classified: Secondary | ICD-10-CM | POA: Insufficient documentation

## 2023-01-05 DIAGNOSIS — J479 Bronchiectasis, uncomplicated: Secondary | ICD-10-CM | POA: Diagnosis not present

## 2023-01-15 ENCOUNTER — Other Ambulatory Visit: Payer: Self-pay | Admitting: Internal Medicine

## 2023-01-27 ENCOUNTER — Other Ambulatory Visit (HOSPITAL_COMMUNITY): Payer: Self-pay | Admitting: Family Medicine

## 2023-01-27 DIAGNOSIS — Z1231 Encounter for screening mammogram for malignant neoplasm of breast: Secondary | ICD-10-CM

## 2023-02-16 ENCOUNTER — Ambulatory Visit (INDEPENDENT_AMBULATORY_CARE_PROVIDER_SITE_OTHER): Payer: PPO | Admitting: Family Medicine

## 2023-02-16 ENCOUNTER — Encounter: Payer: Self-pay | Admitting: Family Medicine

## 2023-02-16 VITALS — BP 156/82 | HR 74 | Ht 63.0 in | Wt 160.1 lb

## 2023-02-16 DIAGNOSIS — R252 Cramp and spasm: Secondary | ICD-10-CM

## 2023-02-16 DIAGNOSIS — J9859 Other diseases of mediastinum, not elsewhere classified: Secondary | ICD-10-CM | POA: Diagnosis not present

## 2023-02-16 DIAGNOSIS — E7800 Pure hypercholesterolemia, unspecified: Secondary | ICD-10-CM

## 2023-02-16 DIAGNOSIS — R899 Unspecified abnormal finding in specimens from other organs, systems and tissues: Secondary | ICD-10-CM

## 2023-02-16 MED ORDER — OLMESARTAN MEDOXOMIL 20 MG PO TABS: 20.0000 mg | ORAL_TABLET | Freq: Every day | ORAL | 3 refills | Status: DC

## 2023-02-16 NOTE — Patient Instructions (Addendum)
Follow-up in 6 weeks, call if you need me sooner.  Stop atorvastatin as this may be causing the cramping that you have.  Stop amlodipine since you believe this is what caused the cramping that you are having.  Creatinine kinase blood test today and sedimentation rate.( CK and ESR)  Continue carvedilol at current dose for blood pressure.  New additional medicine for blood blood pressure is olmesartan 20 mg 1 daily.  Tonic water about approximately 2 ounces once or twice daily will help with cramping.  Labs that you recently had are excellent with normal kidney and liver function and cholesterol at goal.  Repeat chest scan in Jan 2024  Please focus on plant-based eating so that your cholesterol is still low and avoid fried and fatty foods sausage bacon butter cheese nuts.  Drink skim milk or almond milk.  Thanks for choosing [facility], we consider it a privelige to serve you.

## 2023-02-16 NOTE — Assessment & Plan Note (Signed)
Likely related to statin, check cK and eSR stop statin

## 2023-02-17 ENCOUNTER — Telehealth: Payer: Self-pay | Admitting: Family Medicine

## 2023-02-17 ENCOUNTER — Other Ambulatory Visit: Payer: Self-pay | Admitting: Family Medicine

## 2023-02-17 ENCOUNTER — Other Ambulatory Visit: Payer: Self-pay

## 2023-02-17 ENCOUNTER — Ambulatory Visit (INDEPENDENT_AMBULATORY_CARE_PROVIDER_SITE_OTHER): Payer: PPO

## 2023-02-17 DIAGNOSIS — R002 Palpitations: Secondary | ICD-10-CM

## 2023-02-17 DIAGNOSIS — R899 Unspecified abnormal finding in specimens from other organs, systems and tissues: Secondary | ICD-10-CM | POA: Insufficient documentation

## 2023-02-17 DIAGNOSIS — R252 Cramp and spasm: Secondary | ICD-10-CM

## 2023-02-17 LAB — CK: Total CK: 484 U/L — ABNORMAL HIGH (ref 26–161)

## 2023-02-17 NOTE — Assessment & Plan Note (Signed)
Rept scan in 12/2022, follwing initial oin 08/2022 is unchanged, rept in Jan 2025 fro stability

## 2023-02-17 NOTE — Assessment & Plan Note (Signed)
Hyperlipidemia:Low fat diet discussed and encouraged.   Lipid Panel  Lab Results  Component Value Date   CHOL 150 12/28/2022   HDL 44 12/28/2022   LDLCALC 88 12/28/2022   TRIG 98 12/28/2022   CHOLHDL 3.4 12/28/2022     Stop statin due to muscle cramps and pain and elevated CK, follow low fat diet

## 2023-02-17 NOTE — Assessment & Plan Note (Signed)
Elevated CKMB, requested on accident, rather than CK Ith co morbidities , despite the fact that pt denies any symptoms of CAD, EKG don thefolowing day and is NORMAL

## 2023-02-17 NOTE — Progress Notes (Signed)
   Kristen Cox     MRN: [id number]      DOB: February 10, 1941  Chief Complaint  Patient presents with   Follow-up    Follow up cramping    HPI Kristen Cox is here for follow up and re-evaluation of chronic medical conditions, medication management and review of any available recent lab and radiology data.  Preventive health is updated, specifically  Cancer screening and Immunization.   C/o excessive and disabling crmping of legs at times making it increasingly difficult to walk and disturbs her sleep. A several year history but ntablyworse since new BP med, however statin likely te culprit ROS Denies recent fever or chills. Denies sinus pressure, nasal congestion, ear pain or sore throat. Denies chest congestion, productive cough or wheezing. Denies chest pains, palpitations and leg swelling Denies abdominal pain, nausea, vomiting,diarrhea or constipation.   Denies dysuria, frequency, hesitancy or incontinence. Denies depression, anxiety  Denies skin break down or rash.   PE  BP (!) 156/82   Pulse 74   Ht 5\' 3"  (1.6 m)   Wt 160 lb 1.9 oz (72.6 kg)   SpO2 93%   BMI 28.36 kg/m   Patient alert and oriented and in no cardiopulmonary distress.  HEENT: No facial asymmetry, EOMI,     Neck supple .  Chest: Clear to auscultation bilaterally.  CVS: S1, S2 no murmurs, no S3.Regular rate.  ABD: Soft non tender.   Ext: No edema  MS: Decreased  ROM spine, shoulders, hips and knees.  Skin: Intact, no ulcerations or rash noted.  Psych: Good eye contact, normal affect. Memory intact not anxious or depressed appearing.  CNS: CN 2-12 intact, power,  normal throughout.no focal deficits noted.   Assessment & Plan  Muscle cramps Likely related to statin, check cK and eSR stop statin

## 2023-02-17 NOTE — Telephone Encounter (Signed)
[facility] lab tech called after hours received call report by fax calling for critical results [phone] (Lab PC paged)

## 2023-02-17 NOTE — Telephone Encounter (Signed)
Dr Lodema Hong aware

## 2023-02-18 LAB — SEDIMENTATION RATE: Sed Rate: 2 mm/hr (ref 0–40)

## 2023-02-18 LAB — CREATININE KINASE MB: CK-MB Index: 8.7 ng/mL (ref 0.0–5.3)

## 2023-02-21 ENCOUNTER — Other Ambulatory Visit: Payer: Self-pay | Admitting: Family Medicine

## 2023-02-21 DIAGNOSIS — J309 Allergic rhinitis, unspecified: Secondary | ICD-10-CM

## 2023-03-04 ENCOUNTER — Ambulatory Visit (HOSPITAL_COMMUNITY)
Admission: RE | Admit: 2023-03-04 | Discharge: 2023-03-04 | Disposition: A | Discharge status: Home / Self Care | Payer: PPO | Source: Ambulatory Visit | Attending: Family Medicine | Admitting: Family Medicine

## 2023-03-04 DIAGNOSIS — Z1231 Encounter for screening mammogram for malignant neoplasm of breast: Secondary | ICD-10-CM | POA: Insufficient documentation

## 2023-03-07 ENCOUNTER — Ambulatory Visit (HOSPITAL_COMMUNITY): Payer: PPO

## 2023-03-09 ENCOUNTER — Ambulatory Visit (HOSPITAL_COMMUNITY)
Admission: RE | Admit: 2023-03-09 | Discharge: 2023-03-09 | Disposition: A | Discharge status: Home / Self Care | Payer: PPO | Source: Ambulatory Visit | Attending: Family Medicine | Admitting: Family Medicine

## 2023-03-09 ENCOUNTER — Encounter (HOSPITAL_COMMUNITY): Payer: Self-pay | Admitting: Radiology

## 2023-03-09 DIAGNOSIS — J9859 Other diseases of mediastinum, not elsewhere classified: Secondary | ICD-10-CM | POA: Diagnosis not present

## 2023-03-09 DIAGNOSIS — K8689 Other specified diseases of pancreas: Secondary | ICD-10-CM | POA: Diagnosis not present

## 2023-03-09 MED ORDER — IOHEXOL 300 MG/ML  SOLN
75.0000 mL | Freq: Once | INTRAMUSCULAR | Status: AC | PRN
  Administered 2023-03-09: 75 mL via INTRAVENOUS

## 2023-03-21 DIAGNOSIS — E213 Hyperparathyroidism, unspecified: Secondary | ICD-10-CM | POA: Diagnosis not present

## 2023-03-28 ENCOUNTER — Ambulatory Visit: Payer: PPO | Admitting: Nurse Practitioner

## 2023-03-28 ENCOUNTER — Encounter: Payer: Self-pay | Admitting: Nurse Practitioner

## 2023-03-28 VITALS — BP 150/90 | HR 78 | Ht 63.0 in | Wt 164.8 lb

## 2023-03-28 DIAGNOSIS — E213 Hyperparathyroidism, unspecified: Secondary | ICD-10-CM

## 2023-03-28 NOTE — Progress Notes (Signed)
03/28/2023                 Endocrinology follow-up note   Subjective:    Patient ID: Kristen Cox, female    DOB: 1941-08-20, PCP Kerri Perches, MD   Past Medical History:  Diagnosis Date   Deep vein thrombosis (DVT) (HCC) 08/2016   Right leg   Dry eyes 2010   on restasis - Dr. Charise Killian    Gout 2009   Hyperlipidemia    Hypertension    Spinal stenosis    Past Surgical History:  Procedure Laterality Date   CESAREAN SECTION     COLONOSCOPY  04/09/2009   ZOX:[id number] papilla otherwise, normal rectum/Left-sided diverticula/Cecal polyp status post cold snare and removal/The remainder of the colonic mucosa appeared normal. Adenomatous polyps, next TCS 03/2016 per RMR.   COLONOSCOPY N/A 05/19/2016   Procedure: COLONOSCOPY;  Surgeon: Corbin Ade, MD;  Location: [facility];  Service: Endoscopy;  Laterality: N/A;  1:00 PM   Social History   Socioeconomic History   Marital status: Widowed    Spouse name: Not on file   Number of children: 2   Years of education: Not on file   Highest education level: 10th grade  Occupational History   Occupation: retired   Tobacco Use   Smoking status: Former    Current packs/day: 0.00    Average packs/day: 0.5 packs/day for 10.0 years (5.0 ttl pk-yrs)    Types: Cigarettes    Start date: 10/06/1973    Quit date: 10/07/1983    Years since quitting: 39.4   Smokeless tobacco: Never  Vaping Use   Vaping status: Never Used  Substance and Sexual Activity   Alcohol use: No   Drug use: No   Sexual activity: Not Currently    Birth control/protection: Post-menopausal  Other Topics Concern   Not on file  Social History Narrative   Not on file   Social Determinants of Health   Financial Resource Strain: Low Risk  (01/20/2022)   Overall Financial Resource Strain (CARDIA)    Difficulty of Paying Living Expenses: Not hard at all  Food Insecurity: No Food Insecurity (01/20/2022)   Hunger Vital Sign    Worried About Running Out of Food in  the Last Year: Never true    Ran Out of Food in the Last Year: Never true  Transportation Needs: No Transportation Needs (01/20/2022)   PRAPARE - Administrator, Civil Service (Medical): No    Lack of Transportation (Non-Medical): No  Physical Activity: Inactive (01/20/2022)   Exercise Vital Sign    Days of Exercise per Week: 0 days    Minutes of Exercise per Session: 0 min  Stress: No Stress Concern Present (01/20/2022)   [employer] of Occupational Health - Occupational Stress Questionnaire    Feeling of Stress : Not at all  Social Connections: Socially Isolated (01/20/2022)   Social Connection and Isolation Panel [NHANES]    Frequency of Communication with Friends and Family: More than three times a week    Frequency of Social Gatherings with Friends and Family: Never    Attends Religious Services: Never    Database administrator or Organizations: No    Attends Banker Meetings: Never    Marital Status: Widowed   Outpatient Encounter Medications as of 03/28/2023  Medication Sig   aspirin EC 81 MG tablet Take 81 mg by mouth daily.   carvedilol (COREG) 12.5 MG tablet Take 1 tablet (  12.5 mg total) by mouth 2 (two) times daily with a meal.   cholecalciferol (VITAMIN D3) 25 MCG (1000 UNIT) tablet Take 1,000 Units by mouth daily.   Flaxseed, Linseed, (FLAXSEED OIL) 1200 MG CAPS Take by mouth.   montelukast (SINGULAIR) 10 MG tablet TAKE 1 TABLET(10 MG) BY MOUTH AT BEDTIME (Patient not taking: Reported on 03/28/2023)   olmesartan (BENICAR) 20 MG tablet Take 1 tablet (20 mg total) by mouth daily. (Patient not taking: Reported on 03/28/2023)   No facility-administered encounter medications on file as of 03/28/2023.   ALLERGIES: Allergies  Allergen Reactions   Amlodipine Other (See Comments)    Excess cramping and stiffness of hands and legs   Benazepril Other (See Comments)    Patient does not recall being on this medication and/or what reaction she might have had.    Metoprolol Other (See Comments)    CRAMPS CRAMPS CRAMPS   VACCINATION STATUS: Immunization History  Administered Date(s) Administered   Fluad Quad(high Dose 65+) 06/06/2019, 07/08/2020, 07/28/2020, 07/14/2021, 05/07/2022   Influenza, High Dose Seasonal PF 06/05/2020   Influenza,inj,Quad PF,6+ Mos 06/12/2014, 08/21/2015, 07/08/2016, 06/23/2017, 04/18/2018   Pneumococcal Conjugate-13 11/13/2014   Pneumococcal Polysaccharide-23 02/17/2016   Tdap 02/01/2011    HPI  82 year old female with medical history as above. She is being seen in follow-up for hypercalcemia related to mild hyperparathyroidism.  She is currently being observed only.  She has not required any surgical intervention nor pharmaceutical intervention at this time.    -Her previsit labs show serum calcium of 10.2, stable and her PTH remains stable as well. -She is currently taking vitamin D supplement 1000 units of vitamin D3 daily and recent Vitamin d level was 41.2.  -She denies any new complaints other than leg cramps and arthritis.  Her BP meds are being adjusted by her PCP, has follow up with her tomorrow.   She denies any family history of parathyroid dysfunction.  She denies any hx of CVA, seizures, nephrolithiasis, nor CKD. she denies Osteoporosis. she is a former smoker.  She has not had a bone density test since 2015.   She denies any new complaints.   Review of systems  Constitutional: + Minimally fluctuating body weight,  current Body mass index is 29.19 kg/m. , no fatigue, no subjective hyperthermia, no subjective hypothermia Eyes: no blurry vision, no xerophthalmia ENT: no sore throat, no nodules palpated in throat, no dysphagia/odynophagia, no hoarseness Cardiovascular: no chest pain, no shortness of breath, no palpitations, no leg swelling Respiratory: no cough, no shortness of breath Gastrointestinal: no nausea/vomiting/diarrhea Musculoskeletal: + muscle/joint aches- OA, leg cramps Skin: no  rashes, no hyperemia Neurological: no tremors, no numbness, no tingling, no dizziness Psychiatric: no depression, no anxiety   Objective:    BP (!) 150/90 (BP Location: Right Arm, Patient Position: Sitting, Cuff Size: Large) Comment: Retake manaule cuff - patient is currently holding BP medication, She follows back up with her PCP 03/29/23.  Pulse 78   Ht 5\' 3"  (1.6 m)   Wt 164 lb 12.8 oz (74.8 kg)   BMI 29.19 kg/m   Wt Readings from Last 3 Encounters:  03/28/23 164 lb 12.8 oz (74.8 kg)  02/16/23 160 lb 1.9 oz (72.6 kg)  01/04/23 164 lb 1.9 oz (74.4 kg)    BP Readings from Last 3 Encounters:  03/28/23 (!) 150/90  02/16/23 (!) 156/82  01/04/23 (!) 160/80     Physical Exam- Limited  Constitutional:  Body mass index is 29.19 kg/m. , not  in acute distress, normal state of mind Eyes:  EOMI, no exophthalmos Musculoskeletal: no gross deformities, strength intact in all four extremities, no gross restriction of joint movements Skin:  no rashes, no hyperemia Neurological: no tremor with outstretched hands   Diabetic Labs (most recent): Lab Results  Component Value Date   HGBA1C 5.5 07/07/2021   HGBA1C 5.9 (H) 11/25/2020   HGBA1C 5.6 07/01/2016    Lipid Panel     Component Value Date/Time   CHOL 150 12/28/2022 0800   TRIG 98 12/28/2022 0800   HDL 44 12/28/2022 0800   CHOLHDL 3.4 12/28/2022 0800   CHOLHDL 4.4 06/06/2019 1422   VLDL 19 02/11/2016 0718   LDLCALC 88 12/28/2022 0800   LDLCALC 139 (H) 06/06/2019 1422   Recent Results (from the past 2160 hour(s))  CKMB     Status: Abnormal   Collection Time: 02/16/23 10:12 AM  Result Value Ref Range   CK-MB Index 8.7 (HH) 0.0 - 5.3 ng/mL    Comment: **Verified by repeat analysis**  Sed Rate (ESR)     Status: None   Collection Time: 02/16/23 10:12 AM  Result Value Ref Range   Sed Rate 2 0 - 40 mm/hr  CK (Creatine Kinase)     Status: Abnormal   Collection Time: 02/17/23  9:24 AM  Result Value Ref Range   Total CK 484  (H) 26 - 161 U/L  Comprehensive metabolic panel     Status: Abnormal   Collection Time: 03/21/23  8:24 AM  Result Value Ref Range   Glucose 103 (H) 70 - 99 mg/dL   BUN 18 8 - 27 mg/dL   Creatinine, Ser 2.95 0.57 - 1.00 mg/dL   eGFR 73 >62 ZH/YQM/5.78   BUN/Creatinine Ratio 22 12 - 28   Sodium 141 134 - 144 mmol/L   Potassium 4.3 3.5 - 5.2 mmol/L   Chloride 104 96 - 106 mmol/L   CO2 24 20 - 29 mmol/L   Calcium 10.2 8.7 - 10.3 mg/dL   Total Protein 6.5 6.0 - 8.5 g/dL   Albumin 4.0 3.7 - 4.7 g/dL   Globulin, Total 2.5 1.5 - 4.5 g/dL   Bilirubin Total 0.5 0.0 - 1.2 mg/dL   Alkaline Phosphatase 70 44 - 121 IU/L   AST 19 0 - 40 IU/L   ALT 15 0 - 32 IU/L  VITAMIN D 25 Hydroxy (Vit-D Deficiency, Fractures)     Status: None   Collection Time: 03/21/23  8:24 AM  Result Value Ref Range   Vit D, 25-Hydroxy 41.2 30.0 - 100.0 ng/mL    Comment: Vitamin D deficiency has been defined by the Institute of Medicine and an Endocrine Society practice guideline as a level of serum 25-OH vitamin D less than 20 ng/mL (1,2). The Endocrine Society went on to further define vitamin D insufficiency as a level between 21 and 29 ng/mL (2). 1. IOM (Institute of Medicine). 2010. Dietary reference    intakes for calcium and D. [place]: The    [employer]. 2. Holick MF, Binkley Burley, Bischoff-Ferrari HA, et al.    Evaluation, treatment, and prevention of vitamin D    deficiency: an Endocrine Society clinical practice    guideline. JCEM. 2011 Jul; 96(7):1911-30.   PTH, intact and calcium     Status: None   Collection Time: 03/21/23  8:24 AM  Result Value Ref Range   PTH 29 15 - 65 pg/mL   PTH Interp Comment     Comment: Interpretation  Intact PTH    Calcium                                 (pg/mL)      (mg/dL) Normal                          15 - 65     8.6 - 10.2 Primary Hyperparathyroidism         >65          >10.2 Secondary Hyperparathyroidism       >65           <10.2 Non-Parathyroid Hypercalcemia       <65          >10.2 Hypoparathyroidism                  <15          < 8.6 Non-Parathyroid Hypocalcemia    15 - 65          < 8.6    Results for DNYA, SILVERMAN (MRN [id number]) as of 03/24/2020 13:12  Ref. Range 11/09/2018 12:27 06/06/2019 14:22 06/12/2019 09:56 12/12/2019 09:29 03/10/2020 07:49  Calcium Latest Ref Range: 8.6 - 10.4 mg/dL 40.9 (H) 81.1 (H) 91.4 11.1 (H) 10.2   Results for JAANAI, RAIA (MRN [id number]) as of 03/25/2021 09:43  Ref. Range 03/16/2021 08:24  Calcium Latest Ref Range: 8.7 - 10.3 mg/dL 08.6  Vitamin D, 57-QIONGEX Latest Ref Range: 30.0 - 100.0 ng/mL 77.6  PTH, Intact Latest Ref Range: 15 - 65 pg/mL 33  PTH Interp Unknown Comment    Latest Reference Range & Units 03/16/22 09:35  Calcium 8.7 - 10.3 mg/dL 9.9  Vitamin D, 52-WUXLKGM 30.0 - 100.0 ng/mL 70.8  PTH, Intact 15 - 65 pg/mL 30  PTH Interp  Comment     Latest Reference Range & Units 03/21/23 08:24  COMPREHENSIVE METABOLIC PANEL  Rpt !  Sodium 134 - 144 mmol/L 141  Potassium 3.5 - 5.2 mmol/L 4.3  Chloride 96 - 106 mmol/L 104  CO2 20 - 29 mmol/L 24  Glucose 70 - 99 mg/dL 010 (H)  BUN 8 - 27 mg/dL 18  Creatinine 2.72 - 5.36 mg/dL 6.44  Calcium 8.7 - 03.4 mg/dL 74.2  BUN/Creatinine Ratio 12 - 28  22  eGFR >59 mL/min/1.73 73  Alkaline Phosphatase 44 - 121 IU/L 70  Albumin 3.7 - 4.7 g/dL 4.0  AST 0 - 40 IU/L 19  ALT 0 - 32 IU/L 15  Total Protein 6.0 - 8.5 g/dL 6.5  Total Bilirubin 0.0 - 1.2 mg/dL 0.5  Vitamin D, 59-DGLOVFI 30.0 - 100.0 ng/mL 41.2  Globulin, Total 1.5 - 4.5 g/dL 2.5  PTH, Intact 15 - 65 pg/mL 29  PTH Interp  Comment  !: Data is abnormal (H): Data is abnormally high Rpt: View report in Results Review for more information  Assessment & Plan:   1. Hypercalcemia- mild primary hyperparathyroidism Her repeat labs have been reviewed with her.  Her PTH-rp was < 2.0 previously.   Her calcium and PTH levels were once again stable.    -She will  not require any intervention at this time, neither surgical or pharmaceutical.  We will repeat PTH and calcium levels prior to next visit in 1 year.  - I discussed the potential complications of untreated hypercalcemia.  She is benefiting from continued vitamin D supplement, currently  1000 units of vitamin D3 daily.  Advised her to take maintenance dose of vitamin D3 1000 PO units daily.  -She was previously documented to have normal bone density.  She will be considered for Sensipar treatment if calcium levels become and remain elevated at subsequent visits.  - I advised patient to maintain close follow up with Kerri Perches, MD for primary care needs.    I spent  16  minutes in the care of the patient today including review of labs from Thyroid Function, CMP, and other relevant labs ; imaging/biopsy records (current and previous including abstractions from other facilities); face-to-face time discussing  her lab results and symptoms, medications doses, her options of short and long term treatment based on the latest standards of care / guidelines;   and documenting the encounter.  Tacy Dura Bevan  participated in the discussions, expressed understanding, and voiced agreement with the above plans.  All questions were answered to her satisfaction. she is encouraged to contact clinic should she have any questions or concerns prior to her return visit.   Follow up plan: Return in about 1 year (around 03/27/2024) for hypercalcemia follow up, Previsit labs.  Ronny Bacon, [facility] [address] [place], [place] [address] Phone: [phone] Fax: [phone]   03/28/2023, 10:07 AM

## 2023-03-30 ENCOUNTER — Ambulatory Visit (INDEPENDENT_AMBULATORY_CARE_PROVIDER_SITE_OTHER): Payer: PPO | Admitting: Family Medicine

## 2023-03-30 ENCOUNTER — Encounter: Payer: Self-pay | Admitting: Family Medicine

## 2023-03-30 VITALS — BP 162/90 | HR 78 | Ht 63.0 in | Wt 165.0 lb

## 2023-03-30 DIAGNOSIS — E785 Hyperlipidemia, unspecified: Secondary | ICD-10-CM

## 2023-03-30 DIAGNOSIS — I2510 Atherosclerotic heart disease of native coronary artery without angina pectoris: Secondary | ICD-10-CM | POA: Diagnosis not present

## 2023-03-30 DIAGNOSIS — J9859 Other diseases of mediastinum, not elsewhere classified: Secondary | ICD-10-CM

## 2023-03-30 DIAGNOSIS — M170 Bilateral primary osteoarthritis of knee: Secondary | ICD-10-CM

## 2023-03-30 DIAGNOSIS — R252 Cramp and spasm: Secondary | ICD-10-CM | POA: Diagnosis not present

## 2023-03-30 DIAGNOSIS — I359 Nonrheumatic aortic valve disorder, unspecified: Secondary | ICD-10-CM

## 2023-03-30 DIAGNOSIS — I10 Essential (primary) hypertension: Secondary | ICD-10-CM | POA: Diagnosis not present

## 2023-03-30 DIAGNOSIS — I700 Atherosclerosis of aorta: Secondary | ICD-10-CM

## 2023-03-30 MED ORDER — OLMESARTAN MEDOXOMIL 20 MG PO TABS: 20.0000 mg | ORAL_TABLET | Freq: Every day | ORAL | 3 refills | Status: DC

## 2023-03-30 NOTE — Patient Instructions (Addendum)
F/u early October, first week preferred  Annual exam first week in January  Stop tonic water , you may use rub on feet and likely do not even need it  indefinitely  CK, fasting lipid, cmp and eGFr 5 to 7 days before next appointment  Repeat chest scan in 6 months  Need shingles vaccines at the pharmacy  You are referred to Cardiology for evaluation of heart, important that you go, contact us if you hjave not heard from Cardiology re your appointment in the next 2 weeks please  BP is high, you NEED olmesartan 20 mg one daily, this been sent to your pharmacy, please collect on your way home if possible and start today  Continue carvedilol twice daily, 12 hours apart

## 2023-04-04 ENCOUNTER — Encounter: Payer: Self-pay | Admitting: Family Medicine

## 2023-04-04 DIAGNOSIS — I2510 Atherosclerotic heart disease of native coronary artery without angina pectoris: Secondary | ICD-10-CM | POA: Insufficient documentation

## 2023-04-04 DIAGNOSIS — I700 Atherosclerosis of aorta: Secondary | ICD-10-CM | POA: Insufficient documentation

## 2023-04-04 DIAGNOSIS — I359 Nonrheumatic aortic valve disorder, unspecified: Secondary | ICD-10-CM | POA: Insufficient documentation

## 2023-04-04 NOTE — Assessment & Plan Note (Signed)
Hyperlipidemia:Low fat diet discussed and encouraged.   Lipid Panel  Lab Results  Component Value Date   CHOL 150 12/28/2022   HDL 44 12/28/2022   LDLCALC 88 12/28/2022   TRIG 98 12/28/2022   CHOLHDL 3.4 12/28/2022     Updated lab needed at/ before next visit.

## 2023-04-04 NOTE — Assessment & Plan Note (Signed)
Improved with intra articular injections, doing well now

## 2023-04-04 NOTE — Assessment & Plan Note (Signed)
Resolved essentially , pt will d/c tonic water and topical rub  Rept CK

## 2023-04-04 NOTE — Assessment & Plan Note (Signed)
Cardiology to evaluate and ,manage, discussed the importance of thois , pt is very agreeable

## 2023-04-04 NOTE — Assessment & Plan Note (Signed)
Refer for echo , eval murmur and calcifications seen  on aortic valve

## 2023-04-04 NOTE — Assessment & Plan Note (Signed)
Uncontrolled , needs o fill and take olmesartan as was prescribed DASH diet and commitment to daily physical activity for a minimum of 30 minutes discussed and encouraged, as a part of hypertension management. The importance of attaining a healthy weight is also discussed.     03/30/2023   10:56 AM 03/30/2023   10:54 AM 03/28/2023    9:37 AM 03/28/2023    9:26 AM 02/16/2023   10:00 AM 02/16/2023    9:20 AM 02/16/2023    9:19 AM  BP/Weight  Systolic BP 162 162 150 166 156 153 153  Diastolic BP 90 76 90 92 82 80 83  Wt. (Lbs)  165  164.8   160.12  BMI  29.23 kg/m2  29.19 kg/m2   28.36 kg/m2

## 2023-04-04 NOTE — Assessment & Plan Note (Signed)
Rept scan I July shows stable lesion , 6 month follow up recommended by Radiology will scheduule

## 2023-04-04 NOTE — Progress Notes (Signed)
Kristen Cox     MRN: [id number]      DOB: 02-May-1941  Chief Complaint  Patient presents with   Follow-up    Follow up    HPI Kristen Cox is here for follow up and re-evaluation of chronic medical conditions, medication management and review of any available recent lab and radiology data.  Recent chest scan reviewed in detail, and questions answered to pt satisfaction Needs cardiology eval and f/u scan States cram[ps have resolved with change from triamterene, has ben taking tonic water and topical cream , wants to stop both , I have told her that's great Preventive health is updated, specifically  Cancer screening and Immunization.   Questions or concerns regarding consultations or procedures which the PT has had in the interim are  addressed. The PT denies any adverse reactions to current medications since the last visit.  C/o knee pain adns tiffness ROS Denies recent fever or chills. Denies sinus pressure, nasal congestion, ear pain or sore throat. Denies chest congestion, productive cough or wheezing. Denies chest pains, palpitations and leg swelling Denies abdominal pain, nausea, vomiting,diarrhea or constipation.   Denies dysuria, frequency, hesitancy or incontinence. Denies  uncontrolled joint pain, swelling and limitation in mobility. Denies headaches, seizures, numbness, or tingling. Denies depression, anxiety or insomnia. Denies skin break down or rash.   PE  BP (!) 162/90 (BP Location: Left Arm, Patient Position: Sitting, Cuff Size: Normal)   Pulse 78   Ht 5\' 3"  (1.6 m)   Wt 165 lb (74.8 kg)   SpO2 94%   BMI 29.23 kg/m   Patient alert and oriented and in no cardiopulmonary distress.  HEENT: No facial asymmetry, EOMI,     Neck decreased ROM .  Chest: Clear to auscultation bilaterally.  CVS: S1, S2 systolic  murmurs, no S3.Regular rate.  ABD: Soft non tender.   Ext: No edema  MS: Adequate  though reduced ROM spine, shoulders, hips and knees.  Skin:  Intact, no ulcerations or rash noted.  Psych: Good eye contact, normal affect. Memory intact not anxious or depressed appearing.  CNS: CN 2-12 intact, power,  normal throughout.no focal deficits noted.   Assessment & Plan  Aortic valve calcification Refer for echo , eval murmur and calcifications seen  on aortic valve  Coronary atherosclerosis Cardiology to evaluate and ,manage, discussed the importance of thois , pt is very agreeable  Mediastinal mass Rept scan I July shows stable lesion , 6 month follow up recommended by Radiology will scheduule  Hyperlipemia Hyperlipidemia:Low fat diet discussed and encouraged.   Lipid Panel  Lab Results  Component Value Date   CHOL 150 12/28/2022   HDL 44 12/28/2022   LDLCALC 88 12/28/2022   TRIG 98 12/28/2022   CHOLHDL 3.4 12/28/2022     Updated lab needed at/ before next visit.   Arthritis of both knees Improved with intra articular injections, doing well now  Essential hypertension Uncontrolled , needs o fill and take olmesartan as was prescribed DASH diet and commitment to daily physical activity for a minimum of 30 minutes discussed and encouraged, as a part of hypertension management. The importance of attaining a healthy weight is also discussed.     03/30/2023   10:56 AM 03/30/2023   10:54 AM 03/28/2023    9:37 AM 03/28/2023    9:26 AM 02/16/2023   10:00 AM 02/16/2023    9:20 AM 02/16/2023    9:19 AM  BP/Weight  Systolic BP 162 162 150 166 156  153 153  Diastolic BP 90 76 90 92 82 80 83  Wt. (Lbs)  165  164.8   160.12  BMI  29.23 kg/m2  29.19 kg/m2   28.36 kg/m2       Muscle cramps Resolved essentially , pt will d/c tonic water and topical rub  Rept CK

## 2023-04-09 ENCOUNTER — Other Ambulatory Visit: Payer: Self-pay | Admitting: Family Medicine

## 2023-04-09 DIAGNOSIS — E7800 Pure hypercholesterolemia, unspecified: Secondary | ICD-10-CM

## 2023-04-10 ENCOUNTER — Other Ambulatory Visit: Payer: Self-pay | Admitting: Family Medicine

## 2023-04-11 ENCOUNTER — Telehealth: Payer: Self-pay | Admitting: Family Medicine

## 2023-04-11 DIAGNOSIS — E785 Hyperlipidemia, unspecified: Secondary | ICD-10-CM | POA: Diagnosis not present

## 2023-04-11 DIAGNOSIS — R252 Cramp and spasm: Secondary | ICD-10-CM | POA: Diagnosis not present

## 2023-04-11 DIAGNOSIS — I10 Essential (primary) hypertension: Secondary | ICD-10-CM | POA: Diagnosis not present

## 2023-04-11 NOTE — Telephone Encounter (Signed)
Patient stop by to do her blood work. Dr Lodema Hong said if she has not heard from heart doctor was suppose to call her in 2 weeks to let our office know and would set this appoint,ent up.  Patient call # [phone], please advise

## 2023-04-12 LAB — CMP14+EGFR
ALT: 12 IU/L (ref 0–32)
Sodium: 141 mmol/L (ref 134–144)
eGFR: 77 mL/min/{1.73_m2} (ref >59)

## 2023-04-12 LAB — LIPID PANEL: VLDL Cholesterol Cal: 24 mg/dL (ref 5–40)

## 2023-04-12 NOTE — Telephone Encounter (Signed)
No vm set up -kg

## 2023-04-12 NOTE — Telephone Encounter (Signed)
Pt LVM returning Katie's call

## 2023-04-12 NOTE — Telephone Encounter (Signed)
Patient returning Katie's call

## 2023-04-13 NOTE — Telephone Encounter (Signed)
Patient aware and given number to call to schedule appt

## 2023-05-10 ENCOUNTER — Ambulatory Visit (HOSPITAL_COMMUNITY): Admission: RE | Admit: 2023-05-10 | Payer: PPO | Source: Ambulatory Visit

## 2023-05-11 ENCOUNTER — Other Ambulatory Visit: Payer: Self-pay | Admitting: Family Medicine

## 2023-05-15 DIAGNOSIS — M545 Low back pain, unspecified: Secondary | ICD-10-CM | POA: Diagnosis not present

## 2023-05-15 DIAGNOSIS — N10 Acute tubulo-interstitial nephritis: Secondary | ICD-10-CM | POA: Diagnosis not present

## 2023-05-15 DIAGNOSIS — Z6828 Body mass index (BMI) 28.0-28.9, adult: Secondary | ICD-10-CM | POA: Diagnosis not present

## 2023-05-15 DIAGNOSIS — M62830 Muscle spasm of back: Secondary | ICD-10-CM | POA: Diagnosis not present

## 2023-05-15 DIAGNOSIS — M25569 Pain in unspecified knee: Secondary | ICD-10-CM | POA: Diagnosis not present

## 2023-05-15 DIAGNOSIS — M519 Unspecified thoracic, thoracolumbar and lumbosacral intervertebral disc disorder: Secondary | ICD-10-CM | POA: Diagnosis not present

## 2023-05-15 DIAGNOSIS — N201 Calculus of ureter: Secondary | ICD-10-CM | POA: Diagnosis not present

## 2023-05-15 DIAGNOSIS — E663 Overweight: Secondary | ICD-10-CM | POA: Diagnosis not present

## 2023-05-15 DIAGNOSIS — R030 Elevated blood-pressure reading, without diagnosis of hypertension: Secondary | ICD-10-CM | POA: Diagnosis not present

## 2023-05-15 DIAGNOSIS — M5416 Radiculopathy, lumbar region: Secondary | ICD-10-CM | POA: Diagnosis not present

## 2023-05-15 DIAGNOSIS — N200 Calculus of kidney: Secondary | ICD-10-CM | POA: Diagnosis not present

## 2023-06-06 DIAGNOSIS — B351 Tinea unguium: Secondary | ICD-10-CM | POA: Diagnosis not present

## 2023-06-06 DIAGNOSIS — I739 Peripheral vascular disease, unspecified: Secondary | ICD-10-CM | POA: Diagnosis not present

## 2023-06-06 DIAGNOSIS — M79675 Pain in left toe(s): Secondary | ICD-10-CM | POA: Diagnosis not present

## 2023-06-06 DIAGNOSIS — M79674 Pain in right toe(s): Secondary | ICD-10-CM | POA: Diagnosis not present

## 2023-06-09 ENCOUNTER — Ambulatory Visit: Payer: PPO | Admitting: Family Medicine

## 2023-06-09 ENCOUNTER — Encounter: Payer: Self-pay | Admitting: Internal Medicine

## 2023-06-09 ENCOUNTER — Ambulatory Visit: Payer: PPO | Attending: Internal Medicine | Admitting: Internal Medicine

## 2023-06-09 VITALS — BP 128/64 | HR 76 | Ht 63.0 in | Wt 168.0 lb

## 2023-06-09 DIAGNOSIS — I2510 Atherosclerotic heart disease of native coronary artery without angina pectoris: Secondary | ICD-10-CM | POA: Diagnosis not present

## 2023-06-09 DIAGNOSIS — E7800 Pure hypercholesterolemia, unspecified: Secondary | ICD-10-CM | POA: Diagnosis not present

## 2023-06-09 NOTE — Progress Notes (Signed)
Cardiology Office Note   Date:  06/09/2023   ID:  Kristen, Cox [birth year], MRN [id number]  PCP:  Kerri Perches, MD  Cardiologist:   Dietrich Pates, MD   Patient referred back for eval of CAD      History of Present Illness: Kristen Cox is a 82 y.o. female with a history of DVT (R leg, 2018), HL, HTN, palpitations/ tachycardia  and hyperparathyroidism  I saw the pt in 2022   Monitor showed average HR 84 bpm   Occasional PACs, PVCs.  NSVT  5 beats   One episode at SVT lasting 13 beats    Pt is followed by Berenice Primas   Seen in Aug 2024  She had CTs of chest earlier this year that showed atherosclerosis of aorta and coronary arteriers  The pt denies CP  She denies SOB   Knees limit her activity     Does not walk regularly        Current Meds  Medication Sig   amLODipine (NORVASC) 5 MG tablet TAKE 1 TABLET(5 MG) BY MOUTH DAILY   aspirin EC 81 MG tablet Take 81 mg by mouth daily.   atorvastatin (LIPITOR) 40 MG tablet TAKE 1 TABLET(40 MG) BY MOUTH DAILY   carvedilol (COREG) 12.5 MG tablet TAKE 1 TABLET(12.5 MG) BY MOUTH TWICE DAILY WITH A MEAL   cholecalciferol (VITAMIN D3) 25 MCG (1000 UNIT) tablet Take 1,000 Units by mouth daily.   Flaxseed, Linseed, (FLAXSEED OIL) 1200 MG CAPS Take by mouth.   montelukast (SINGULAIR) 10 MG tablet TAKE 1 TABLET(10 MG) BY MOUTH AT BEDTIME   olmesartan (BENICAR) 20 MG tablet Take 1 tablet (20 mg total) by mouth daily.     Allergies:   Amlodipine, Benazepril, and Metoprolol   Past Medical History:  Diagnosis Date   Deep vein thrombosis (DVT) (HCC) 08/2016   Right leg   Dry eyes 2010   on restasis - Dr. Charise Killian    Gout 2009   Hyperlipidemia    Hypertension    Spinal stenosis     Past Surgical History:  Procedure Laterality Date   CESAREAN SECTION     COLONOSCOPY  04/09/2009   YQM:VHQI papilla otherwise, normal rectum/Left-sided diverticula/Cecal polyp status post cold snare and removal/The remainder of the colonic mucosa  appeared normal. Adenomatous polyps, next TCS 03/2016 per RMR.   COLONOSCOPY N/A 05/19/2016   Procedure: COLONOSCOPY;  Surgeon: Corbin Ade, MD;  Location: [facility];  Service: Endoscopy;  Laterality: N/A;  1:00 PM     Social History:  The patient  reports that she quit smoking about 39 years ago. Her smoking use included cigarettes. She started smoking about 49 years ago. She has a 5 pack-year smoking history. She has never used smokeless tobacco. She reports that she does not drink alcohol and does not use drugs.   Family History:  The patient's family history includes Cirrhosis in her brother; Diabetes in her sister; Pulmonary embolism in her sister; Stroke in her father and mother.    ROS:  Please see the history of present illness. All other systems are reviewed and  Negative to the above problem except as noted.    PHYSICAL EXAM: VS:  BP 128/64   Pulse 76   Ht 5\' 3"  (1.6 m)   Wt 168 lb (76.2 kg)   SpO2 98%   BMI 29.76 kg/m   GEN: Well nourished, well developed, in no acute distress  HEENT: normal  Neck:  no JVD, carotid bruit Cardiac: RRR; no murmur  Trace LE   edema  Respiratory:  clear to auscultation GI: soft, nontender   No hepatomegaly    EKG:  EKG not done   EKG on 12/03/20:  Probable ST with PVCs 1 couplet   115 bpm   Septal infarct On 07/08/20:  ST  113 bpm  Occasional PVC     Lipid Panel    Component Value Date/Time   CHOL 247 (H) 04/11/2023 0852   TRIG 132 04/11/2023 0852   HDL 44 04/11/2023 0852   CHOLHDL 5.6 (H) 04/11/2023 0852   CHOLHDL 4.4 06/06/2019 1422   VLDL 19 02/11/2016 0718   LDLCALC 179 (H) 04/11/2023 0852   LDLCALC 139 (H) 06/06/2019 1422      Wt Readings from Last 3 Encounters:  06/09/23 168 lb (76.2 kg)  03/30/23 165 lb (74.8 kg)  03/28/23 164 lb 12.8 oz (74.8 kg)      ASSESSMENT AND PLAN:   1 CAD   I have reviewed CT scan that pt had in may (chest, no contrast)   The pt has extensive coronary artery calcifications    SOme  calcifications of aorta as well   SHe is not very active   No anginal symptoms  With extent of calcifications I would recomm Lexiscan PET/CT to eval ischemia, flow reserve  2  HL   Pt's last LDL was 179, HDL 44  Trig 478   She was started on statin in Aug   Will check lipomed       3  Hx tachycardia  HR is good today       Follow up will be based on test results   IF OK follow up in spring     Current medicines are reviewed at length with the patient today.  The patient does not have concerns regarding medicines.  Signed, Dietrich Pates, MD  06/09/2023 10:04 AM    [facility] [address] [place], [place], [place]  [address] Phone: [phone]; Fax: [phone]

## 2023-06-09 NOTE — Patient Instructions (Signed)
Medication Instructions:  Your physician recommends that you continue on your current medications as directed. Please refer to the Current Medication list given to you today.  *If you need a refill on your cardiac medications before your next appointment, please call your pharmacy*   Lab Work: Your physician recommends that you return for lab work in: Today at [facility]   If you have labs (blood work) drawn today and your tests are completely normal, you will receive your results only by: [employer] (if you have MyChart) OR A paper copy in the mail If you have any lab test that is abnormal or we need to change your treatment, we will call you to review the results.   Testing/Procedures: How to Prepare for Your Cardiac PET/CT Stress Test:  1. Please do not take these medications before your test:   Medications that may interfere with the cardiac pharmacological stress agent (ex. nitrates - including erectile dysfunction medications, isosorbide mononitrate, tamulosin or beta-blockers) the day of the exam. (Erectile dysfunction medication should be held for at least 72 hrs prior to test) Theophylline containing medications for 12 hours. Dipyridamole 48 hours prior to the test. Your remaining medications may be taken with water.  2. Nothing to eat or drink, except water, 3 hours prior to arrival time.   NO caffeine/decaffeinated products, or chocolate 12 hours prior to arrival.  3. NO perfume, cologne or lotion on chest or abdomen area.          - FEMALES - Please avoid wearing dresses to this appointment.  4. Total time is 1 to 2 hours; you may want to bring reading material for the waiting time.  5. Please report to Radiology at the [facility] Main Entrance 30 minutes early for your test.  [address] [place], [place] [address]  6. Please report to Radiology at [facility] Main Entrance, medical mall, 30 mins prior to your test.  [address]  [place], [place]  [phone]  Diabetic Preparation:  Hold oral medications. You may take NPH and Lantus insulin. Do not take Humalog or Humulin R (Regular Insulin) the day of your test. Check blood sugars prior to leaving the house. If able to eat breakfast prior to 3 hour fasting, you may take all medications, including your insulin, Do not worry if you miss your breakfast dose of insulin - start at your next meal. Patients who wear a continuous glucose monitor MUST remove the device prior to scanning.  IF YOU THINK YOU MAY BE PREGNANT, OR ARE NURSING PLEASE INFORM THE TECHNOLOGIST.  In preparation for your appointment, medication and supplies will be purchased.  Appointment availability is limited, so if you need to cancel or reschedule, please call the Radiology Department at [phone] [facility]) OR [phone] [facility])  24 hours in advance to avoid a cancellation fee of $100.00  What to Expect After you Arrive:  Once you arrive and check in for your appointment, you will be taken to a preparation room within the Radiology Department.  A technologist or Nurse will obtain your medical history, verify that you are correctly prepped for the exam, and explain the procedure.  Afterwards,  an IV will be started in your arm and electrodes will be placed on your skin for EKG monitoring during the stress portion of the exam. Then you will be escorted to the PET/CT scanner.  There, staff will get you positioned on the scanner and obtain a blood pressure  and EKG.  During the exam, you will continue to be connected to the EKG and blood pressure machines.  A small, safe amount of a radioactive tracer will be injected in your IV to obtain a series of pictures of your heart along with an injection of a stress agent.    After your Exam:  It is recommended that you eat a meal and drink a caffeinated beverage to counter act any effects of the stress agent.  Drink plenty of fluids for  the remainder of the day and urinate frequently for the first couple of hours after the exam.  Your doctor will inform you of your test results within 7-10 business days.  For more information and frequently asked questions, please visit our website : [email or web address]  For questions about your test or how to prepare for your test, please call: Cardiac Imaging Nurse Navigators Office: [phone]    Follow-Up: At [facility], you and your health needs are our priority.  As part of our continuing mission to provide you with exceptional heart care, we have created designated Provider Care Teams.  These Care Teams include your primary Cardiologist (physician) and Advanced Practice Providers (APPs -  Physician Assistants and Nurse Practitioners) who all work together to provide you with the care you need, when you need it.  We recommend signing up for the patient portal called "MyChart".  Sign up information is provided on this After Visit Summary.  MyChart is used to connect with patients for Virtual Visits (Telemedicine).  Patients are able to view lab/test results, encounter notes, upcoming appointments, etc.  Non-urgent messages can be sent to your provider as well.   To learn more about what you can do with MyChart, go to [email or web address].    Your next appointment:   6 month(s)  Provider:   Dietrich Pates, MD    Other Instructions Thank you for choosing [facility]!

## 2023-06-15 ENCOUNTER — Encounter: Payer: Self-pay | Admitting: Family Medicine

## 2023-06-15 ENCOUNTER — Ambulatory Visit (INDEPENDENT_AMBULATORY_CARE_PROVIDER_SITE_OTHER): Payer: PPO | Admitting: Family Medicine

## 2023-06-15 VITALS — BP 148/79 | HR 88 | Ht 63.0 in | Wt 162.1 lb

## 2023-06-15 DIAGNOSIS — Z23 Encounter for immunization: Secondary | ICD-10-CM | POA: Diagnosis not present

## 2023-06-15 DIAGNOSIS — I10 Essential (primary) hypertension: Secondary | ICD-10-CM

## 2023-06-15 DIAGNOSIS — E785 Hyperlipidemia, unspecified: Secondary | ICD-10-CM | POA: Diagnosis not present

## 2023-06-15 NOTE — Patient Instructions (Signed)
Keep annual appointment as before, call if you need me sooner  Will watch blood pressure  Lower fat intake  Fasting lipid, cmp and eGFR 3 to 5 days before January appointment  It is important that you exercise regularly at least 30 minutes 5 times a week. If you develop chest pain, have severe difficulty breathing, or feel very tired, stop exercising immediately and seek medical attention   Thanks for choosing [facility], we consider it a privelige to serve you.

## 2023-06-16 LAB — LIPOPROTEIN A (LPA): Lipoprotein (a): 316.5 nmol/L — ABNORMAL HIGH (ref <75.0)

## 2023-06-16 LAB — NMR, LIPOPROFILE

## 2023-06-16 LAB — APOLIPOPROTEIN B: Apolipoprotein B: 94 mg/dL — ABNORMAL HIGH (ref <90)

## 2023-06-20 ENCOUNTER — Encounter: Payer: Self-pay | Admitting: Family Medicine

## 2023-06-20 DIAGNOSIS — Z23 Encounter for immunization: Secondary | ICD-10-CM | POA: Insufficient documentation

## 2023-06-20 NOTE — Assessment & Plan Note (Signed)
Elevated at vist , will re eval in 8 to  10 weeks DASH diet and commitment to daily physical activity for a minimum of 30 minutes discussed and encouraged, as a part of hypertension management. The importance of attaining a healthy weight is also discussed.     06/15/2023    3:41 PM 06/15/2023    3:39 PM 06/09/2023    9:37 AM 03/30/2023   10:56 AM 03/30/2023   10:54 AM 03/28/2023    9:37 AM 03/28/2023    9:26 AM  BP/Weight  Systolic BP 148 151 128 162 162 150 166  Diastolic BP 79 75 64 90 76 90 92  Wt. (Lbs)  162.08 168  165  164.8  BMI  28.71 kg/m2 29.76 kg/m2  29.23 kg/m2  29.19 kg/m2

## 2023-06-20 NOTE — Assessment & Plan Note (Signed)
Hyperlipidemia:Low fat diet discussed and encouraged.   Lipid Panel  Lab Results  Component Value Date   CHOL 247 (H) 04/11/2023   HDL 44 04/11/2023   LDLCALC 179 (H) 04/11/2023   TRIG 132 04/11/2023   CHOLHDL 5.6 (H) 04/11/2023     Updated lab needed at/ before next visit.

## 2023-06-20 NOTE — Progress Notes (Signed)
Kristen Cox     MRN: [id number]      DOB: 07-16-1941  Chief Complaint  Patient presents with   Follow-up    Follow up, allergy medicine causing cramps     HPI Kristen Cox is here for follow up and re-evaluation of chronic medical conditions, medication management and review of any available recent lab and radiology data.  Preventive health is updated, specifically  Cancer screening and Immunization.   Questions or concerns regarding consultations or procedures which the PT has had in the interim are  addressed.Has been evaluated by Cardiology and has upcoming tests ROS Denies recent fever or chills. Denies sinus pressure, nasal congestion, ear pain or sore throat. Denies chest congestion, productive cough or wheezing. Denies chest pains, palpitations and leg swelling Denies abdominal pain, nausea, vomiting,diarrhea or constipation.   Denies dysuria, frequency, hesitancy or incontinence. Denies uncontrolled  joint pain, swelling and limitation in mobility. Denies headaches, seizures, numbness, or tingling. Denies depression, anxiety or insomnia. Denies skin break down or rash.   PE  BP (!) 148/79 (BP Location: Left Arm, Patient Position: Sitting, Cuff Size: Large)   Pulse 88   Ht 5\' 3"  (1.6 m)   Wt 162 lb 1.3 oz (73.5 kg)   SpO2 95%   BMI 28.71 kg/m   Patient alert and oriented and in no cardiopulmonary distress.  HEENT: No facial asymmetry, EOMI,     Neck supple .  Chest: Clear to auscultation bilaterally.  CVS: S1, S2 no murmurs, no S3.Regular rate.  ABD: Soft non tender.   Ext: No edema  MS: Decreased  ROM spine, shoulders, hips and knees.  Skin: Intact, no ulcerations or rash noted.  Psych: Good eye contact, normal affect. Memory intact not anxious or depressed appearing.  CNS: CN 2-12 intact, power,  normal throughout.no focal deficits noted.   Assessment & Plan  Encounter for immunization After obtaining informed consent, the vaccine is   administered , with no adverse effect noted at the time of administration.   Essential hypertension Elevated at vist , will re eval in 8 to  10 weeks DASH diet and commitment to daily physical activity for a minimum of 30 minutes discussed and encouraged, as a part of hypertension management. The importance of attaining a healthy weight is also discussed.     06/15/2023    3:41 PM 06/15/2023    3:39 PM 06/09/2023    9:37 AM 03/30/2023   10:56 AM 03/30/2023   10:54 AM 03/28/2023    9:37 AM 03/28/2023    9:26 AM  BP/Weight  Systolic BP 148 151 128 162 162 150 166  Diastolic BP 79 75 64 90 76 90 92  Wt. (Lbs)  162.08 168  165  164.8  BMI  28.71 kg/m2 29.76 kg/m2  29.23 kg/m2  29.19 kg/m2       Hyperlipemia Hyperlipidemia:Low fat diet discussed and encouraged.   Lipid Panel  Lab Results  Component Value Date   CHOL 247 (H) 04/11/2023   HDL 44 04/11/2023   LDLCALC 179 (H) 04/11/2023   TRIG 132 04/11/2023   CHOLHDL 5.6 (H) 04/11/2023     Updated lab needed at/ before next visit.

## 2023-06-20 NOTE — Assessment & Plan Note (Signed)
After obtaining informed consent, the vaccine is  administered , with no adverse effect noted at the time of administration.  

## 2023-06-21 ENCOUNTER — Other Ambulatory Visit: Payer: Self-pay

## 2023-06-21 DIAGNOSIS — E7800 Pure hypercholesterolemia, unspecified: Secondary | ICD-10-CM

## 2023-06-27 DIAGNOSIS — I10 Essential (primary) hypertension: Secondary | ICD-10-CM | POA: Diagnosis not present

## 2023-06-27 DIAGNOSIS — E785 Hyperlipidemia, unspecified: Secondary | ICD-10-CM | POA: Diagnosis not present

## 2023-06-27 DIAGNOSIS — E7800 Pure hypercholesterolemia, unspecified: Secondary | ICD-10-CM | POA: Diagnosis not present

## 2023-06-28 ENCOUNTER — Encounter: Payer: Self-pay | Admitting: Family Medicine

## 2023-06-28 LAB — CMP14+EGFR
ALT: 13 [IU]/L (ref 0–32)
AST: 19 [IU]/L (ref 0–40)
Albumin: 3.9 g/dL (ref 3.7–4.7)
Alkaline Phosphatase: 78 [IU]/L (ref 44–121)
BUN/Creatinine Ratio: 10 — ABNORMAL LOW (ref 12–28)
BUN: 9 mg/dL (ref 8–27)
Bilirubin Total: 0.7 mg/dL (ref 0.0–1.2)
CO2: 21 mmol/L (ref 20–29)
Calcium: 10.2 mg/dL (ref 8.7–10.3)
Chloride: 102 mmol/L (ref 96–106)
Creatinine, Ser: 0.86 mg/dL (ref 0.57–1.00)
Globulin, Total: 2.9 g/dL (ref 1.5–4.5)
Glucose: 105 mg/dL — ABNORMAL HIGH (ref 70–99)
Potassium: 4.5 mmol/L (ref 3.5–5.2)
Sodium: 139 mmol/L (ref 134–144)
Total Protein: 6.8 g/dL (ref 6.0–8.5)
eGFR: 67 mL/min/{1.73_m2} (ref >59)

## 2023-06-28 LAB — NMR, LIPOPROFILE
Cholesterol, Total: 158 mg/dL (ref 100–199)
HDL Particle Number: 27.4 umol/L — ABNORMAL LOW (ref >=30.5)
HDL-C: 42 mg/dL (ref >39)
LDL Particle Number: 1019 nmol/L — ABNORMAL HIGH (ref <1000)
LDL Size: 21.5 nm (ref >20.5)
LDL-C (NIH Calc): 92 mg/dL (ref 0–99)
LP-IR Score: 33 (ref <=45)
Small LDL Particle Number: 375 nmol/L (ref <=527)
Triglycerides: 133 mg/dL (ref 0–149)

## 2023-06-28 LAB — LIPID PANEL
Chol/HDL Ratio: 4 ratio (ref 0.0–4.4)
Cholesterol, Total: 158 mg/dL (ref 100–199)
HDL: 40 mg/dL (ref >39)
LDL Chol Calc (NIH): 95 mg/dL (ref 0–99)
Triglycerides: 128 mg/dL (ref 0–149)
VLDL Cholesterol Cal: 23 mg/dL (ref 5–40)

## 2023-06-29 ENCOUNTER — Telehealth: Payer: Self-pay

## 2023-06-29 DIAGNOSIS — E7800 Pure hypercholesterolemia, unspecified: Secondary | ICD-10-CM

## 2023-06-29 MED ORDER — EZETIMIBE 10 MG PO TABS
10.0000 mg | ORAL_TABLET | Freq: Every day | ORAL | 3 refills | Status: DC
Start: 2023-06-29 — End: 2024-04-24

## 2023-06-29 NOTE — Telephone Encounter (Signed)
Pt notified and verbalized understanding.

## 2023-06-29 NOTE — Telephone Encounter (Signed)
-----   Message from Nurse Dewayne Hatch B sent at 06/29/2023  8:17 AM EST -----  ----- Message ----- From: Frutoso Schatz, RN Sent: 06/29/2023   7:44 AM EST To: Bertram Millard, RN   ----- Message ----- From: Pricilla Riffle, MD Sent: 06/28/2023   9:05 PM EST To: Cv Div [facility] Triage  LDL Is 92 with particles over 1000 Given signficant CAD on CT scan should be lower Goal close to 70   I would recomm adding Zetia to regimen 10 mg    FOllow up lipomed and liver panel in 8 to 12 wks

## 2023-06-30 ENCOUNTER — Telehealth (HOSPITAL_COMMUNITY): Payer: Self-pay | Admitting: Emergency Medicine

## 2023-07-01 ENCOUNTER — Telehealth (HOSPITAL_COMMUNITY): Payer: Self-pay | Admitting: Emergency Medicine

## 2023-07-01 NOTE — Telephone Encounter (Signed)
Reaching out to patient to offer assistance regarding upcoming cardiac imaging study; pt verbalizes understanding of appt date/time, parking situation and where to check in, pre-test NPO status and medications ordered, and verified current allergies; name and call back number provided for further questions should they arise Cayne Yom RN Navigator Cardiac Imaging [facility] [phone] office [phone] cell 

## 2023-07-05 ENCOUNTER — Encounter (HOSPITAL_COMMUNITY)
Admission: RE | Admit: 2023-07-05 | Discharge: 2023-07-05 | Disposition: A | Discharge status: Home / Self Care | Payer: PPO | Source: Ambulatory Visit | Attending: Internal Medicine | Admitting: Internal Medicine

## 2023-07-05 DIAGNOSIS — I2510 Atherosclerotic heart disease of native coronary artery without angina pectoris: Secondary | ICD-10-CM | POA: Insufficient documentation

## 2023-07-05 LAB — NM PET CT CARDIAC PERFUSION MULTI W/ABSOLUTE BLOODFLOW
LV dias vol: 58 mL (ref 46–106)
MBFR: 1.97
Nuc Rest EF: 52 %
Nuc Stress EF: 66 %
Peak HR: 107 {beats}/min
Rest HR: 74 {beats}/min
Rest MBF: 0.99 ml/g/min
Rest Nuclear Isotope Dose: 19.2 mCi
Rest perfusion cavity size (mL): 58 mL
ST Depression (mm): 0 mm
Stress MBF: 1.95 ml/g/min
Stress Nuclear Isotope Dose: 19 mCi
Stress perfusion cavity size (mL): 61 mL
TID: 1

## 2023-07-05 MED ORDER — REGADENOSON 0.4 MG/5ML IV SOLN
0.4000 mg | Freq: Once | INTRAVENOUS | Status: AC
  Administered 2023-07-05: 0.4 mg via INTRAVENOUS

## 2023-07-05 MED ORDER — RUBIDIUM RB82 GENERATOR (RUBYFILL)
19.4000 | PACK | Freq: Once | INTRAVENOUS | Status: AC
  Administered 2023-07-05: 19.2 via INTRAVENOUS

## 2023-07-05 MED ORDER — REGADENOSON 0.4 MG/5ML IV SOLN
INTRAVENOUS | Status: AC
  Filled 2023-07-05: qty 5

## 2023-07-05 MED ORDER — RUBIDIUM RB82 GENERATOR (RUBYFILL)
19.4000 | PACK | Freq: Once | INTRAVENOUS | Status: AC
  Administered 2023-07-05: 19.04 via INTRAVENOUS

## 2023-07-05 NOTE — Progress Notes (Signed)
Tolerated test well w/o symptoms

## 2023-07-06 ENCOUNTER — Telehealth: Payer: Self-pay

## 2023-07-06 DIAGNOSIS — I359 Nonrheumatic aortic valve disorder, unspecified: Secondary | ICD-10-CM

## 2023-07-06 NOTE — Telephone Encounter (Signed)
-----   Message from Valentine sent at 07/06/2023 12:17 AM EST ----- Stress test shows normal blood flow to the heart   No ischemia  Aortic valve appears calcified  Set up for echo    There is a cystic structure in front of heart. Probably benign but will need to be followed over time   Will forward to Dr Lodema Hong

## 2023-07-06 NOTE — Telephone Encounter (Signed)
Spoke with pt and went over the result note from Dr. Tenny Craw. Pt verbalized understanding of the plan of care and having an echo completed. Pt told to call back with any questions.

## 2023-07-11 ENCOUNTER — Ambulatory Visit (HOSPITAL_COMMUNITY): Payer: PPO | Attending: Internal Medicine

## 2023-07-11 DIAGNOSIS — I359 Nonrheumatic aortic valve disorder, unspecified: Secondary | ICD-10-CM | POA: Diagnosis not present

## 2023-07-11 LAB — ECHOCARDIOGRAM COMPLETE
Area-P 1/2: 3.88 cm2
S' Lateral: 2.9 cm

## 2023-07-12 ENCOUNTER — Telehealth: Payer: Self-pay | Admitting: Internal Medicine

## 2023-07-12 NOTE — Telephone Encounter (Signed)
Spoke to pt's niece, okay per verbal consent of pt who was present, who verbalized understanding of pt's testing results. Pt's niece had no questions or concerns at this time.

## 2023-07-12 NOTE — Telephone Encounter (Signed)
Niece Corrie Dandy) returned staff call and stated she is with the patient now and can reach them at [phone].

## 2023-07-12 NOTE — Telephone Encounter (Signed)
Spoke to pt's niece Ms. Foster, and advised that she is not on pt's DPR, so we would not be able to release medical information to her. Pt's niece stated that she would go to pt's house so pt can give verbal consent for our office to speak to pt.

## 2023-07-12 NOTE — Telephone Encounter (Signed)
Niece Corrie Dandy) returned staff call regarding results.

## 2023-07-13 ENCOUNTER — Ambulatory Visit: Payer: PPO

## 2023-07-13 VITALS — BP 138/78

## 2023-07-13 DIAGNOSIS — Z780 Asymptomatic menopausal state: Secondary | ICD-10-CM | POA: Diagnosis not present

## 2023-07-13 DIAGNOSIS — Z0000 Encounter for general adult medical examination without abnormal findings: Secondary | ICD-10-CM | POA: Diagnosis not present

## 2023-07-13 NOTE — Progress Notes (Addendum)
Subjective:   Kristen Cox is a 82 y.o. female who presents for Medicare Annual (Subsequent) preventive examination.  Visit Complete: In person  Patient Medicare AWV questionnaire was completed by the patient on 07/13/23; I have confirmed that all information answered by patient is correct and no changes since this date.  Cardiac Risk Factors include: dyslipidemia;hypertension;smoking/ tobacco exposure;advanced age (>3men, >49 women)     Objective:    Today's Vitals   07/13/23 1030 07/13/23 1031  BP: 138/78   PainSc:  0-No pain   There is no height or weight on file to calculate BMI.     07/05/2022    3:21 PM 06/22/2021    3:07 PM 06/19/2020    1:23 PM 12/20/2017   10:00 AM 11/08/2017    5:03 PM 02/14/2017   10:30 AM 05/19/2016   12:02 PM  Advanced Directives  Does Patient Have a Medical Advance Directive? No No No No No No No  Would patient like information on creating a medical advance directive? No - Patient declined No - Patient declined No - Patient declined Yes (MAU/Ambulatory/Procedural Areas - Information given)  Yes (MAU/Ambulatory/Procedural Areas - Information given) No - patient declined information    Current Medications (verified) Outpatient Encounter Medications as of 07/13/2023  Medication Sig   amLODipine (NORVASC) 5 MG tablet TAKE 1 TABLET(5 MG) BY MOUTH DAILY   aspirin EC 81 MG tablet Take 81 mg by mouth daily.   atorvastatin (LIPITOR) 40 MG tablet TAKE 1 TABLET(40 MG) BY MOUTH DAILY   carvedilol (COREG) 12.5 MG tablet TAKE 1 TABLET(12.5 MG) BY MOUTH TWICE DAILY WITH A MEAL   cholecalciferol (VITAMIN D3) 25 MCG (1000 UNIT) tablet Take 1,000 Units by mouth daily.   ezetimibe (ZETIA) 10 MG tablet Take 1 tablet (10 mg total) by mouth daily.   Flaxseed, Linseed, (FLAXSEED OIL) 1200 MG CAPS Take by mouth.   olmesartan (BENICAR) 20 MG tablet Take 1 tablet (20 mg total) by mouth daily.   No facility-administered encounter medications on file as of  07/13/2023.    Allergies (verified) Amlodipine, Benazepril, and Metoprolol   History: Past Medical History:  Diagnosis Date   Deep vein thrombosis (DVT) (HCC) 08/2016   Right leg   Dry eyes 2010   on restasis - Dr. Charise Killian    Gout 2009   Hyperlipidemia    Hypertension    Spinal stenosis    Past Surgical History:  Procedure Laterality Date   CESAREAN SECTION     COLONOSCOPY  04/09/2009   ZOX:WRUE papilla otherwise, normal rectum/Left-sided diverticula/Cecal polyp status post cold snare and removal/The remainder of the colonic mucosa appeared normal. Adenomatous polyps, next TCS 03/2016 per RMR.   COLONOSCOPY N/A 05/19/2016   Procedure: COLONOSCOPY;  Surgeon: Corbin Ade, MD;  Location: [facility];  Service: Endoscopy;  Laterality: N/A;  1:00 PM   Family History  Problem Relation Age of Onset   Stroke Mother    Stroke Father    Diabetes Sister    Pulmonary embolism Sister    Cirrhosis Brother    Colon cancer Neg Hx    Social History   Socioeconomic History   Marital status: Widowed    Spouse name: Not on file   Number of children: 2   Years of education: Not on file   Highest education level: 10th grade  Occupational History   Occupation: retired   Tobacco Use   Smoking status: Former    Current packs/day: 0.00  Average packs/day: 0.5 packs/day for 10.0 years (5.0 ttl pk-yrs)    Types: Cigarettes    Start date: 10/06/1973    Quit date: 10/07/1983    Years since quitting: 39.7   Smokeless tobacco: Never  Vaping Use   Vaping status: Never Used  Substance and Sexual Activity   Alcohol use: No   Drug use: No   Sexual activity: Not Currently    Birth control/protection: Post-menopausal  Other Topics Concern   Not on file  Social History Narrative   Not on file   Social Determinants of Health   Financial Resource Strain: Low Risk  (07/13/2023)   Overall Financial Resource Strain (CARDIA)    Difficulty of Paying Living Expenses: Not hard at all  Food  Insecurity: No Food Insecurity (07/13/2023)   Hunger Vital Sign    Worried About Running Out of Food in the Last Year: Never true    Ran Out of Food in the Last Year: Never true  Transportation Needs: No Transportation Needs (07/13/2023)   PRAPARE - Administrator, Civil Service (Medical): No    Lack of Transportation (Non-Medical): No  Physical Activity: Sufficiently Active (07/13/2023)   Exercise Vital Sign    Days of Exercise per Week: 5 days    Minutes of Exercise per Session: 30 min  Stress: No Stress Concern Present (07/13/2023)   [employer] of Occupational Health - Occupational Stress Questionnaire    Feeling of Stress : Not at all  Social Connections: Socially Isolated (07/13/2023)   Social Connection and Isolation Panel [NHANES]    Frequency of Communication with Friends and Family: Twice a week    Frequency of Social Gatherings with Friends and Family: Three times a week    Attends Religious Services: Never    Active Member of Clubs or Organizations: No    Attends Banker Meetings: Never    Marital Status: Widowed    Tobacco Counseling Counseling given: Not Answered   Clinical Intake:  Pre-visit preparation completed: Yes  Pain : No/denies pain Pain Score: 0-No pain     Nutritional Status: BMI 25 -29 Overweight Diabetes: No  How often do you need to have someone help you when you read instructions, pamphlets, or other written materials from your doctor or pharmacy?: 1 - Never  Interpreter Needed?: No      Activities of Daily Living    07/13/2023   10:35 AM  In your present state of health, do you have any difficulty performing the following activities:  Hearing? 0  Vision? 0  Difficulty concentrating or making decisions? 0  Walking or climbing stairs? 1  Comment still climbs them with caution  Dressing or bathing? 0  Doing errands, shopping? 0  Preparing Food and eating ? N  Using the Toilet? N  In the past six  months, have you accidently leaked urine? N  Do you have problems with loss of bowel control? N  Managing your Medications? N  Managing your Finances? N  Housekeeping or managing your Housekeeping? N    Patient Care Team: Kerri Perches, MD as PCP - General Pricilla Riffle, MD as PCP - Cardiology (Cardiology) Jena Gauss Gerrit Friends, MD as Consulting Physician (Gastroenterology) Roma Kayser, MD as Consulting Physician (Endocrinology) Lazaro Arms, MD as Consulting Physician (Obstetrics and Gynecology) Eugenia Mcalpine, MD (Inactive) as Consulting Physician (Orthopedic Surgery)  Indicate any recent Medical Services you may have received from other than [facility] providers in the past year (date  may be approximate).     Assessment:   This is a routine wellness examination for [facility].  Hearing/Vision screen No results found.   Goals Addressed             This Visit's Progress    Exercise 3x per week (30 min per time)   On track    Recommend starting a routine exercise program at least 3 days a week for 30-45 minutes at a time as tolerated.       Patient Stated   On track    Spending time with grand daughter      Patient Stated   On track    Patient stated that her goal is to continue to be able to work on puzzles and read.     Protect My Health   On track    Stay well.       Depression Screen    06/15/2023    3:40 PM 03/30/2023   10:55 AM 02/16/2023    9:20 AM 01/04/2023    8:39 AM 09/27/2022    8:27 AM 09/14/2022    8:37 AM 08/12/2022    9:02 AM  PHQ 2/9 Scores  PHQ - 2 Score 0 0 0 0 3 2 0  PHQ- 9 Score     3 2     Fall Risk    07/13/2023   10:35 AM 06/15/2023    3:40 PM 03/30/2023   10:55 AM 02/16/2023    9:20 AM 01/04/2023    8:38 AM  Fall Risk   Falls in the past year? 0 0 0 0 Exclusion - non ambulatory  Number falls in past yr: 0 0 0 0   Injury with Fall? 0 0 0 0   Risk for fall due to :  No Fall Risks No Fall Risks No Fall Risks   Follow up Falls  evaluation completed Falls evaluation completed Falls evaluation completed Falls evaluation completed     MEDICARE RISK AT HOME: Medicare Risk at Home Any stairs in or around the home?: Yes If so, are there any without handrails?: No Home free of loose throw rugs in walkways, pet beds, electrical cords, etc?: No Adequate lighting in your home to reduce risk of falls?: No Life alert?: No Use of a cane, walker or w/c?: Yes Grab bars in the bathroom?: Yes Shower chair or bench in shower?: No Elevated toilet seat or a handicapped toilet?: No  TIMED UP AND GO:  Was the test performed?  Yes  Length of time to ambulate 10 feet: 8 sec Gait slow and steady with assistive device    Cognitive Function:    06/22/2021    3:08 PM  MMSE - Mini Mental State Exam  Not completed: Unable to complete        07/13/2023   10:37 AM 07/05/2022    3:25 PM 06/22/2021    3:08 PM 06/19/2020    1:25 PM 06/19/2019    1:34 PM  6CIT Screen  What Year? 0 points 0 points 0 points 0 points 0 points  What month? 0 points 0 points 0 points 0 points 0 points  What time? 0 points 0 points 0 points 0 points 0 points  Count back from 20 0 points 0 points 0 points 2 points 0 points  Months in reverse 2 points 0 points 2 points 0 points 0 points  Repeat phrase 0 points 0 points 0 points 10 points 2 points  Total Score 2  points 0 points 2 points 12 points 2 points    Immunizations Immunization History  Administered Date(s) Administered   Fluad Quad(high Dose 65+) 06/06/2019, 07/08/2020, 07/28/2020, 07/14/2021, 05/07/2022   Fluad Trivalent(High Dose 65+) 06/15/2023   Influenza, High Dose Seasonal PF 06/05/2020   Influenza,inj,Quad PF,6+ Mos 06/12/2014, 08/21/2015, 07/08/2016, 06/23/2017, 04/18/2018   Pneumococcal Conjugate-13 11/13/2014   Pneumococcal Polysaccharide-23 02/17/2016   Tdap 02/01/2011   Zoster Recombinant(Shingrix) 03/30/2023, 06/01/2023    TDAP status: Up to date  Flu Vaccine status: Up  to date  Pneumococcal vaccine status: Up to date  Covid-19 vaccine status: Declined, Education has been provided regarding the importance of this vaccine but patient still declined. Advised may receive this vaccine at local pharmacy or Health Dept.or vaccine clinic. Aware to provide a copy of the vaccination record if obtained from local pharmacy or Health Dept. Verbalized acceptance and understanding.  Qualifies for Shingles Vaccine? Yes   Zostavax completed Yes   Shingrix Completed?: Yes  Screening Tests Health Maintenance  Topic Date Due   COVID-19 Vaccine (1) Never done   DTaP/Tdap/Td (2 - Td or Tdap) 01/31/2021   Medicare Annual Wellness (AWV)  07/12/2024   Pneumonia Vaccine 23+ Years old  Completed   INFLUENZA VACCINE  Completed   DEXA SCAN  Completed   Zoster Vaccines- Shingrix  Completed   HPV VACCINES  Aged Out   Hepatitis C Screening  Discontinued    Health Maintenance  Health Maintenance Due  Topic Date Due   COVID-19 Vaccine (1) Never done   DTaP/Tdap/Td (2 - Td or Tdap) 01/31/2021    Colorectal cancer screening: No longer required.   Mammogram status: Completed 03/07/23. Repeat every year  Bone Density status: Ordered today. Pt provided with contact info and advised to call to schedule appt.  Lung Cancer Screening: (Low Dose CT Chest recommended if Age 44-80 years, 20 pack-year currently smoking OR have quit w/in 15years.) does not qualify.   Lung Cancer Screening Referral: na  Additional Screening:  Hepatitis C Screening: does qualify; Completed   Vision Screening: Recommended annual ophthalmology exams for early detection of glaucoma and other disorders of the eye. Is the patient up to date with their annual eye exam?  Yes  Who is the provider or what is the name of the office in which the patient attends annual eye exams? Goes in April for her eye exam  If pt is not established with a provider, would they like to be referred to a provider to establish  care? No .   Dental Screening: Recommended annual dental exams for proper oral hygiene    Community Resource Referral / Chronic Care Management: CRR required this visit?  No   CCM required this visit?  No     Plan:     I have personally reviewed and noted the following in the patient's chart:   Medical and social history Use of alcohol, tobacco or illicit drugs  Current medications and supplements including opioid prescriptions. Patient is not currently taking opioid prescriptions. Functional ability and status Nutritional status Physical activity Advanced directives List of other physicians Hospitalizations, surgeries, and ER visits in previous 12 months Vitals Screenings to include cognitive, depression, and falls Referrals and appointments  In addition, I have reviewed and discussed with patient certain preventive protocols, quality metrics, and best practice recommendations. A written personalized care plan for preventive services as well as general preventive health recommendations were provided to patient.     Abner Greenspan, LPN  07/13/2023   After Visit Summary: (In Person-Printed) AVS printed and given to the patient  Nurse Notes:

## 2023-07-13 NOTE — Patient Instructions (Signed)
  Kristen Cox , Thank you for taking time to come for your Medicare Wellness Visit. I appreciate your ongoing commitment to your health goals. Please review the following plan we discussed and let me know if I can assist you in the future.   These are the goals we discussed:  Goals      Exercise 3x per week (30 min per time)     Recommend starting a routine exercise program at least 3 days a week for 30-45 minutes at a time as tolerated.       Patient Stated     Spending time with grand daughter      Patient Stated     Patient stated that her goal is to continue to be able to work on puzzles and read.     Protect My Health     Stay well.         This is a list of the screening recommended for you and due dates:  Health Maintenance  Topic Date Due   COVID-19 Vaccine (1) Never done   DTaP/Tdap/Td vaccine (2 - Td or Tdap) 01/31/2021   Medicare Annual Wellness Visit  07/12/2024   Pneumonia Vaccine  Completed   Flu Shot  Completed   DEXA scan (bone density measurement)  Completed   Zoster (Shingles) Vaccine  Completed   HPV Vaccine  Aged Out   Hepatitis C Screening  Discontinued

## 2023-07-26 ENCOUNTER — Other Ambulatory Visit: Payer: Self-pay | Admitting: Family Medicine

## 2023-08-22 DIAGNOSIS — I739 Peripheral vascular disease, unspecified: Secondary | ICD-10-CM | POA: Diagnosis not present

## 2023-08-22 DIAGNOSIS — M79675 Pain in left toe(s): Secondary | ICD-10-CM | POA: Diagnosis not present

## 2023-08-22 DIAGNOSIS — M79674 Pain in right toe(s): Secondary | ICD-10-CM | POA: Diagnosis not present

## 2023-08-22 DIAGNOSIS — B351 Tinea unguium: Secondary | ICD-10-CM | POA: Diagnosis not present

## 2023-08-31 ENCOUNTER — Encounter: Payer: PPO | Admitting: Family Medicine

## 2023-09-06 ENCOUNTER — Telehealth: Payer: Self-pay | Admitting: Family Medicine

## 2023-09-06 NOTE — Telephone Encounter (Signed)
 Copied from CRM [id number]. Topic: General - Other >> Sep 06, 2023  1:39 PM Deleta HERO wrote: Reason for CRM: Melissa calling from [facility] to request procedure authorization from PCP for this patient's scheduled appointment on 01/17 at 11:00a for CT Chest Wo Contrast.  Melissa's Callback #: [phone] [id number]

## 2023-09-07 NOTE — Telephone Encounter (Signed)
 All HTA have end date 08/23/23

## 2023-09-08 ENCOUNTER — Other Ambulatory Visit: Payer: Self-pay | Admitting: Family Medicine

## 2023-09-09 ENCOUNTER — Ambulatory Visit (HOSPITAL_COMMUNITY)
Admission: RE | Admit: 2023-09-09 | Discharge: 2023-09-09 | Disposition: A | Discharge status: Home / Self Care | Payer: Medicare HMO | Source: Ambulatory Visit | Attending: Family Medicine | Admitting: Family Medicine

## 2023-09-09 DIAGNOSIS — J9859 Other diseases of mediastinum, not elsewhere classified: Secondary | ICD-10-CM | POA: Diagnosis not present

## 2023-09-09 DIAGNOSIS — J479 Bronchiectasis, uncomplicated: Secondary | ICD-10-CM | POA: Diagnosis not present

## 2023-09-09 DIAGNOSIS — I700 Atherosclerosis of aorta: Secondary | ICD-10-CM | POA: Diagnosis not present

## 2023-09-20 ENCOUNTER — Ambulatory Visit (INDEPENDENT_AMBULATORY_CARE_PROVIDER_SITE_OTHER): Payer: Medicare HMO | Admitting: Family Medicine

## 2023-09-20 ENCOUNTER — Encounter: Payer: Self-pay | Admitting: Family Medicine

## 2023-09-20 VITALS — BP 154/77 | HR 80 | Ht 63.0 in | Wt 160.1 lb

## 2023-09-20 DIAGNOSIS — Z0001 Encounter for general adult medical examination with abnormal findings: Secondary | ICD-10-CM | POA: Diagnosis not present

## 2023-09-20 DIAGNOSIS — M170 Bilateral primary osteoarthritis of knee: Secondary | ICD-10-CM | POA: Diagnosis not present

## 2023-09-20 DIAGNOSIS — J309 Allergic rhinitis, unspecified: Secondary | ICD-10-CM

## 2023-09-20 DIAGNOSIS — J9859 Other diseases of mediastinum, not elsewhere classified: Secondary | ICD-10-CM | POA: Diagnosis not present

## 2023-09-20 DIAGNOSIS — I10 Essential (primary) hypertension: Secondary | ICD-10-CM

## 2023-09-20 MED ORDER — AMLODIPINE BESYLATE 2.5 MG PO TABS: 2.5000 mg | ORAL_TABLET | Freq: Every day | ORAL | 3 refills | Status: DC

## 2023-09-20 MED ORDER — LEVOCETIRIZINE DIHYDROCHLORIDE 5 MG PO TABS: 5.0000 mg | ORAL_TABLET | Freq: Every evening | ORAL | 3 refills | Status: DC

## 2023-09-20 MED ORDER — PREDNISONE 5 MG PO TABS: 5.0000 mg | ORAL_TABLET | Freq: Two times a day (BID) | ORAL | 0 refills | Status: AC

## 2023-09-20 NOTE — Patient Instructions (Signed)
F/u in 8 to 10 weeks, reevaluate blood pressure  New additional medication for blood pressure is amlodipine 2.5 mg 1 daily.  Please continue your current blood pressure medicines which are amlodipine 5 mg daily carvedilol 1 twice daily and olmesartan 1 daily you can neurology  Thanks for choosing [facility], we consider it a privelige to serve you.   Prednisone is prescribed short term fopr your allergies ans wellas the knee pain  Get fasting blood work for Dr Tenny Craw 2nd week in Feb please  Thanks for choosing [facility], we consider it a privelige to serve you.

## 2023-09-21 DIAGNOSIS — Z0001 Encounter for general adult medical examination with abnormal findings: Secondary | ICD-10-CM | POA: Insufficient documentation

## 2023-09-21 NOTE — Assessment & Plan Note (Addendum)
Currently experiencing flare of left knee pain and stiffness, short course of oral prednisone is prescribed, will call for orhto eval for intra articular if needed

## 2023-09-21 NOTE — Assessment & Plan Note (Signed)
Annual exam as documented.  Immunization needs are specifically addressed at this visit.

## 2023-09-21 NOTE — Assessment & Plan Note (Signed)
Uncontrolled add amlodipine 2.5 mg and re eval in 6 to 8 weeks DASH diet and commitment to daily physical activity for a minimum of 30 minutes discussed and encouraged, as a part of hypertension management. The importance of attaining a healthy weight is also discussed.     09/20/2023   11:12 AM 09/20/2023   11:10 AM 07/13/2023   10:30 AM 07/05/2023    9:55 AM 07/05/2023    9:54 AM 07/05/2023    9:53 AM 07/05/2023    9:52 AM  BP/Weight  Systolic BP 154 161 138 108 110 109 121  Diastolic BP 77 76 78 56 48 47 53  Wt. (Lbs)  160.08       BMI  28.36 kg/m2

## 2023-09-21 NOTE — Assessment & Plan Note (Signed)
Currently uncontrolled , short course prednisone

## 2023-09-21 NOTE — Telephone Encounter (Signed)
Copied from CRM [id number]. Topic: Clinical - Medication Question >> Sep 21, 2023 11:53 AM Shelah Lewandowsky wrote: Reason for CRM: patient has question about medication dosage levocetirizine (XYZAL) 5 MG tablet, [phone]

## 2023-09-21 NOTE — Progress Notes (Signed)
    Kristen Cox     MRN: [id number]      DOB: 1941-06-15  Chief Complaint  Patient presents with   Annual Exam    CPE     HPI: Patient is in for annual physical exam. Uncontrolled blood pressure is addressed C/o increased left knee pain ax 2 weeks also uncontrolled allergy symptoms of clear nasal drainage and sinus congestion, has dry coughit. Recent labs,  are reviewed. Immunization is reviewed , and  updated if needed.   PE: BP (!) 154/77 (BP Location: Left Arm, Patient Position: Sitting)   Pulse 80   Ht 5\' 3"  (1.6 m)   Wt 160 lb 1.3 oz (72.6 kg)   SpO2 94%   BMI 28.36 kg/m   Pleasant  female, alert and oriented x 3, in no cardio-pulmonary distress. Afebrile. HEENT No facial trauma or asymetry. Sinuses non tender. Posisive nasal congestion Extra occullar muscles intact.. External ears normal, . Neck: decreased ROM no adenopathy,JVD or thyromegaly.No bruits.  Chest: Clear to ascultation bilaterally.No crackles or wheezes. Non tender to palpation   Cardiovascular system; Heart sounds normal,  S1 and  S2 ,no S3.  Systolic  murmur,Peripheral pulses normal.  Abdomen: Soft, non tender,  .   Musculoskeletal exam: Decreased  ROM of spine, hips , shoulders and knees.  deformity ,swelling and crepitus noted.in both knees No muscle wasting or atrophy.   Neurologic: Cranial nerves 2 to 12 intact. Power, tone ,sensation and reflexes normal throughout. No disturbance in gait. No tremor.  Skin: Intact, no ulceration, erythema , scaling or rash noted. Pigmentation normal throughout  Psych; Normal mood and affect. Judgement and concentration normal   Assessment & Plan:  Encounter for Medicare annual examination with abnormal findings Annual exam as documented.  Immunization  needs are specifically addressed at this visit.   Essential hypertension Uncontrolled add amlodipine 2.5 mg and re eval in 6 to 8 weeks DASH diet and commitment to daily physical  activity for a minimum of 30 minutes discussed and encouraged, as a part of hypertension management. The importance of attaining a healthy weight is also discussed.     09/20/2023   11:12 AM 09/20/2023   11:10 AM 07/13/2023   10:30 AM 07/05/2023    9:55 AM 07/05/2023    9:54 AM 07/05/2023    9:53 AM 07/05/2023    9:52 AM  BP/Weight  Systolic BP 154 161 138 108 110 109 121  Diastolic BP 77 76 78 56 48 47 53  Wt. (Lbs)  160.08       BMI  28.36 kg/m2            Arthritis of both knees Currently experiencing flare of left knee pain and stiffness, short course of oral prednisone is prescribed, will call for orhto eval for intra articular if needed  Allergic rhinitis Currently uncontrolled , short course prednisone

## 2023-09-23 ENCOUNTER — Telehealth: Payer: Self-pay

## 2023-09-23 NOTE — Telephone Encounter (Signed)
Patient called about xyzal she is aware directions are one tablet every evening

## 2023-09-23 NOTE — Telephone Encounter (Signed)
Copied from CRM [id number]. Topic: Clinical - Medication Question >> Sep 21, 2023  4:05 PM Ivette P wrote: Reason for CRM: Pt received medication and is unsure what dosage to take. On the bottle it says one thing and on the prescription paper it says another. Pt would like some guidance and clarification, requesting callback [phone]

## 2023-10-05 DIAGNOSIS — E7800 Pure hypercholesterolemia, unspecified: Secondary | ICD-10-CM | POA: Diagnosis not present

## 2023-10-06 LAB — NMR, LIPOPROFILE
Cholesterol, Total: 139 mg/dL (ref 100–199)
HDL Particle Number: 30.7 umol/L (ref >=30.5)
HDL-C: 49 mg/dL (ref >39)
LDL Particle Number: 699 nmol/L (ref <1000)
LDL Size: 21.6 nmol (ref >20.5)
LDL-C (NIH Calc): 72 mg/dL (ref 0–99)
LP-IR Score: 31 (ref <=45)
Small LDL Particle Number: 226 nmol/L (ref <=527)
Triglycerides: 98 mg/dL (ref 0–149)

## 2023-10-06 LAB — HEPATIC FUNCTION PANEL
ALT: 15 [IU]/L (ref 0–32)
AST: 21 [IU]/L (ref 0–40)
Albumin: 3.8 g/dL (ref 3.7–4.7)
Alkaline Phosphatase: 65 [IU]/L (ref 44–121)
Bilirubin Total: 0.5 mg/dL (ref 0.0–1.2)
Bilirubin, Direct: 0.18 mg/dL (ref 0.00–0.40)
Total Protein: 6.3 g/dL (ref 6.0–8.5)

## 2023-11-16 ENCOUNTER — Other Ambulatory Visit: Payer: Self-pay | Admitting: Family Medicine

## 2023-11-21 ENCOUNTER — Other Ambulatory Visit: Payer: Self-pay | Admitting: Family Medicine

## 2023-11-22 DIAGNOSIS — M1990 Unspecified osteoarthritis, unspecified site: Secondary | ICD-10-CM | POA: Diagnosis not present

## 2023-11-23 ENCOUNTER — Ambulatory Visit (INDEPENDENT_AMBULATORY_CARE_PROVIDER_SITE_OTHER): Payer: Medicare HMO | Admitting: Family Medicine

## 2023-11-23 ENCOUNTER — Encounter: Payer: Self-pay | Admitting: Family Medicine

## 2023-11-23 VITALS — BP 148/70 | HR 84 | Resp 16 | Ht 64.0 in | Wt 159.0 lb

## 2023-11-23 DIAGNOSIS — M79605 Pain in left leg: Secondary | ICD-10-CM

## 2023-11-23 DIAGNOSIS — M1712 Unilateral primary osteoarthritis, left knee: Secondary | ICD-10-CM | POA: Diagnosis not present

## 2023-11-23 DIAGNOSIS — I10 Essential (primary) hypertension: Secondary | ICD-10-CM

## 2023-11-23 NOTE — Patient Instructions (Addendum)
 F/u in 6  weeks re evaluate blood pressure, call if you need me sooner  Use diclofenac gel 4 times daily to left knee for next 5 days, and take tylenol one twice daily for next 5 days  Use ice to the knee and keep it moving to help relieve arthritis flares and pain  Blood pressure is higher than it should be , please let me know wht amlodipine tablet you have  at home, the dose will need to be increased   Careful not to fall  Continue to bring all medications to visits  Thanks for choosing [facility], we consider it a privelige to serve you.  '

## 2023-11-27 ENCOUNTER — Encounter: Payer: Self-pay | Admitting: Family Medicine

## 2023-11-27 NOTE — Assessment & Plan Note (Signed)
 Current flare with increased pain and tenderness in past 3 days unprovoked, continue management started at Urgent care 1 day ago as alrwady getting great results with topical voltaren Declines PT at this time

## 2023-11-27 NOTE — Assessment & Plan Note (Signed)
 Uncontrolled , once dose verified , pt was not taking total amlodipine dose of 7  mg as prescribed , will start the additional 2.5 mg tablet and be re evaluated in 6 weeks

## 2023-11-27 NOTE — Progress Notes (Signed)
   Kristen Cox     MRN: [id number]      DOB: Jan 29, 1941  Chief Complaint  Patient presents with   Leg Swelling    Complains of swelling in left and right leg/calves. States there was significant swelling yesterday which has improved today but still painful to walk on.     HPI Kristen Cox is here with above concern for which she was at UC one day ago. States she is much better since starting  topical diclofenac ROS Denies recent fever or chills. Denies sinus pressure, nasal congestion, ear pain or sore throat. Denies chest congestion, productive cough or wheezing. Denies chest pains, palpitations and leg swelling Denies abdominal pain, nausea, vomiting,diarrhea or constipation.   Denies dysuria, frequency, hesitancy or incontinence. Denies depression, anxiety or insomnia. Denies skin break down or rash.   PE  BP (!) 148/70   Pulse 84   Resp 16   Ht 5\' 4"  (1.626 m)   Wt 159 lb 0.6 oz (72.1 kg)   SpO2 96%   BMI 27.30 kg/m   Patient alert and oriented and in no cardiopulmonary distress.  HEENT: No facial asymmetry, EOMI,     Neck decreased ROM Chest: Clear to auscultation bilaterally.  CVS: S1, S2 no murmurs, no S3.Regular rate.  ABD: Soft non tender.   Ext: No edema, homan's negative, left calf chronically slightly larger than roight  MS: decreased  ROM spine, shoulders, hips and knees.Left knee markedly deformes, slightly swollen , tender medial aspect , no warmth or erythema  Skin: Intact, no ulcerations or rash noted.  Psych: Good eye contact, normal affect. Memory intact not anxious or depressed appearing.  CNS: CN 2-12 intact, power,  normal throughout.no focal deficits noted.   Assessment & Plan  Osteoarthritis of left knee Current flare with increased pain and tenderness in past 3 days unprovoked, continue management started at Urgent care 1 day ago as alrwady getting great results with topical voltaren Declines PT at this time  Left leg pain Homan's  negative , no warmth in calf  , pain referred from knee  Essential hypertension Uncontrolled , once dose verified , pt was not taking total amlodipine dose of 7  mg as prescribed , will start the additional 2.5 mg tablet and be re evaluated in 6 weeks

## 2023-11-27 NOTE — Assessment & Plan Note (Addendum)
 Homan's negative , no warmth in calf  , pain referred from knee

## 2023-11-28 ENCOUNTER — Other Ambulatory Visit: Payer: Self-pay | Admitting: Family Medicine

## 2023-11-28 ENCOUNTER — Telehealth: Payer: Self-pay

## 2023-11-28 NOTE — Telephone Encounter (Signed)
 Copied from CRM [id number]. Topic: Clinical - Medication Question >> Nov 28, 2023 11:00 AM Melissa C wrote: Reason for CRM: patient is currently on 5 mg of amLODipine (NORVASC). If you have any questions you can contact patient.

## 2023-11-28 NOTE — Telephone Encounter (Signed)
 Copied from CRM [id number]. Topic: Clinical - Medication Question >> Nov 28, 2023 10:31 AM Cammy Copa D wrote: Reason for CRM: PT calling because pharmacy has still not received the 2.5mg  dose of her amLODipine (NORVASC). Pharmacy said that they had not received it yet.

## 2023-12-23 ENCOUNTER — Encounter: Payer: Self-pay | Admitting: *Deleted

## 2023-12-23 ENCOUNTER — Ambulatory Visit
Admission: EM | Admit: 2023-12-23 | Discharge: 2023-12-23 | Disposition: A | Discharge status: Home / Self Care | Attending: Family Medicine | Admitting: Family Medicine

## 2023-12-23 DIAGNOSIS — M549 Dorsalgia, unspecified: Secondary | ICD-10-CM

## 2023-12-23 DIAGNOSIS — M542 Cervicalgia: Secondary | ICD-10-CM | POA: Diagnosis not present

## 2023-12-23 MED ORDER — CYCLOBENZAPRINE HCL 5 MG PO TABS: 2.5000 mg | ORAL_TABLET | Freq: Three times a day (TID) | ORAL | 0 refills | Status: DC | PRN

## 2023-12-23 NOTE — Discharge Instructions (Signed)
 I suspect your pain to be muscular in nature.  I have sent over a muscle relaxer and you may use Voltaren gel topically, heat, muscle rubs, massage, stretches.  Follow-up with your primary care provider if not resolving

## 2023-12-23 NOTE — ED Provider Notes (Signed)
 [facility]    CSN: [id number] Arrival date & time: 12/23/23  1251      History   Chief Complaint Chief Complaint  Patient presents with   Neck Pain    HPI Kristen Cox is a 83 y.o. female.   Patient presenting today with almost a week of bilateral neck pain extending down towards shoulders and upper back.  Stiffness with moving the head side-to-side but denies radiation of pain down the arms, numbness, tingling, weakness.  Denies any new injury to the area but does think her pillow may be related.  Has been trying Aspercreme, heat, Tylenol  with only mild relief.  Does have a history of arthritis but states this feels different.    Past Medical History:  Diagnosis Date   Deep vein thrombosis (DVT) (HCC) 08/2016   Right leg   Dry eyes 2010   on restasis - Dr. Barbra Boone    Gout 2009   Hyperlipidemia    Hypertension    Spinal stenosis     Patient Active Problem List   Diagnosis Date Noted   Encounter for Medicare annual examination with abnormal findings 09/21/2023   Aortic valve calcification 04/04/2023   Atherosclerosis of aorta (HCC) 04/04/2023   Coronary atherosclerosis 04/04/2023   Abnormal laboratory test 02/17/2023   Muscle cramps 02/16/2023   Osteoarthritis of left knee 09/27/2022   Mediastinal mass 09/14/2022   Left anterior knee pain 09/14/2022   H/O falling 04/04/2022   Achilles tendon disorder 07/14/2021   Arthritis of both knees 01/20/2021   Palpitations 07/10/2020   Overweight (BMI 25.0-29.9) 04/10/2020   Vitamin D  deficiency 12/19/2019   Nocturnal leg cramps 06/09/2019   Myositis of multiple sites 09/12/2018   Left leg pain 11/08/2017   Shoulder pain, right 11/06/2017   Hyperparathyroidism (HCC) 04/21/2017   History of colonic polyps 05/06/2016   Back pain 03/24/2015   Hypercalcemia 11/19/2013   Prediabetes 04/29/2010   Hyperlipemia 02/13/2010   Gout 01/27/2010   Essential hypertension 01/27/2010   Allergic rhinitis 01/27/2010     Past Surgical History:  Procedure Laterality Date   CESAREAN SECTION     COLONOSCOPY  04/09/2009   WUJ:WJXB papilla otherwise, normal rectum/Left-sided diverticula/Cecal polyp status post cold snare and removal/The remainder of the colonic mucosa appeared normal. Adenomatous polyps, next TCS 03/2016 per RMR.   COLONOSCOPY N/A 05/19/2016   Procedure: COLONOSCOPY;  Surgeon: Suzette Espy, MD;  Location: [facility];  Service: Endoscopy;  Laterality: N/A;  1:00 PM    OB History     Gravida  2   Para  2   Term      Preterm      AB      Living  2      SAB      IAB      Ectopic      Multiple      Live Births               Home Medications    Prior to Admission medications   Medication Sig Start Date End Date Taking? Authorizing Provider  amLODipine  (NORVASC ) 2.5 MG tablet Take 1 tablet (2.5 mg total) by mouth daily. 09/20/23  Yes Towanda Fret, MD  amLODipine  (NORVASC ) 5 MG tablet TAKE 1 TABLET(5 MG) BY MOUTH DAILY 11/16/23  Yes Towanda Fret, MD  aspirin EC 81 MG tablet Take 81 mg by mouth daily.   Yes [provider]  atorvastatin  (LIPITOR) 40 MG tablet TAKE  1 TABLET(40 MG) BY MOUTH DAILY 04/11/23  Yes Towanda Fret, MD  carvedilol  (COREG ) 12.5 MG tablet TAKE 1 TABLET(12.5 MG) BY MOUTH TWICE DAILY WITH A MEAL 09/08/23  Yes Towanda Fret, MD  cholecalciferol  (VITAMIN D3) 25 MCG (1000 UNIT) tablet Take 1,000 Units by mouth daily.   Yes [provider]  cyclobenzaprine  (FLEXERIL ) 5 MG tablet Take 0.5-1 tablets (2.5-5 mg total) by mouth 3 (three) times daily as needed for muscle spasms. Do not drink alcohol or drive while taking this medication.  May cause drowsiness. 12/23/23  Yes Corbin Dess, PA-C  ezetimibe  (ZETIA ) 10 MG tablet Take 1 tablet (10 mg total) by mouth daily. 06/29/23 12/23/23 Yes Elmyra Haggard, MD  Flaxseed, Linseed, (FLAXSEED OIL) 1200 MG CAPS Take by mouth.   Yes [provider]  levocetirizine  (XYZAL ) 5 MG tablet Take 1 tablet (5 mg total) by mouth every evening. 09/20/23  Yes Towanda Fret, MD  olmesartan  (BENICAR ) 20 MG tablet TAKE 1 TABLET(20 MG) BY MOUTH DAILY 11/21/23  Yes Towanda Fret, MD    Family History Family History  Problem Relation Age of Onset   Stroke Mother    Stroke Father    Diabetes Sister    Pulmonary embolism Sister    Cirrhosis Brother    Colon cancer Neg Hx     Social History Social History   Tobacco Use   Smoking status: Former    Current packs/day: 0.00    Average packs/day: 0.5 packs/day for 10.0 years (5.0 ttl pk-yrs)    Types: Cigarettes    Start date: 10/06/1973    Quit date: 10/07/1983    Years since quitting: 40.2   Smokeless tobacco: Never  Vaping Use   Vaping status: Never Used  Substance Use Topics   Alcohol use: No   Drug use: No     Allergies   Amlodipine , Benazepril, and Metoprolol    Review of Systems Review of Systems PER HPI  Physical Exam Triage Vital Signs ED Triage Vitals  Encounter Vitals Group     BP 12/23/23 1259 (!) 148/78     Systolic BP Percentile --      Diastolic BP Percentile --      Pulse Rate 12/23/23 1259 83     Resp 12/23/23 1259 18     Temp 12/23/23 1259 99.2 F (37.3 C)     Temp Source 12/23/23 1259 Oral     SpO2 12/23/23 1259 95 %     Weight --      Height --      Head Circumference --      Peak Flow --      Pain Score 12/23/23 1257 10     Pain Loc --      Pain Education --      Exclude from Growth Chart --    No data found.  Updated Vital Signs BP (!) 148/78 (BP Location: Right Arm)   Pulse 83   Temp 99.2 F (37.3 C) (Oral)   Resp 18   SpO2 95%   Visual Acuity Right Eye Distance:   Left Eye Distance:   Bilateral Distance:    Right Eye Near:   Left Eye Near:    Bilateral Near:     Physical Exam Vitals and nursing note reviewed.  Constitutional:      Appearance: Normal appearance. She is not ill-appearing.  HENT:     Head: Atraumatic.  Eyes:      Extraocular Movements:  Extraocular movements intact.     Conjunctiva/sclera: Conjunctivae normal.  Cardiovascular:     Rate and Rhythm: Normal rate and regular rhythm.     Heart sounds: Normal heart sounds.  Pulmonary:     Effort: Pulmonary effort is normal.     Breath sounds: Normal breath sounds.  Musculoskeletal:        General: Tenderness present. No swelling, deformity or signs of injury. Normal range of motion.     Cervical back: Normal range of motion and neck supple.     Comments: No midline spinal tenderness to palpation diffusely.  Range of motion of the cervical spine intact.  Grip strength full and equal bilateral hands.  Tenderness to palpation diffusely to cervical paraspinal muscles, trapezius, lats bilaterally  Skin:    General: Skin is warm and dry.     Findings: No bruising or erythema.  Neurological:     Mental Status: She is alert and oriented to person, place, and time.     Motor: No weakness.     Gait: Gait normal.     Comments: Bilateral upper extremities neurovascularly intact  Psychiatric:        Mood and Affect: Mood normal.        Thought Content: Thought content normal.        Judgment: Judgment normal.      UC Treatments / Results  Labs (all labs ordered are listed, but only abnormal results are displayed) Labs Reviewed - No data to display  EKG   Radiology No results found.  Procedures Procedures (including critical care time)  Medications Ordered in UC Medications - No data to display  Initial Impression / Assessment and Plan / UC Course  I have reviewed the triage vital signs and the nursing notes.  Pertinent labs & imaging results that were available during my care of the patient were reviewed by me and considered in my medical decision making (see chart for details).     Consistent with muscular pain.  Treat with low-dose Flexeril , Tylenol , Voltaren gel, massage, heat, stretches.  Return for worsening symptoms.  Final Clinical  Impressions(s) / UC Diagnoses   Final diagnoses:  Bilateral neck pain  Upper back pain     Discharge Instructions      I suspect your pain to be muscular in nature.  I have sent over a muscle relaxer and you may use Voltaren gel topically, heat, muscle rubs, massage, stretches.  Follow-up with your primary care provider if not resolving    ED Prescriptions     Medication Sig Dispense Auth. Provider   cyclobenzaprine  (FLEXERIL ) 5 MG tablet Take 0.5-1 tablets (2.5-5 mg total) by mouth 3 (three) times daily as needed for muscle spasms. Do not drink alcohol or drive while taking this medication.  May cause drowsiness. 15 tablet Corbin Dess, [place]      PDMP not reviewed this encounter.   Corbin Dess, [place] 12/23/23 1314

## 2023-12-23 NOTE — ED Triage Notes (Signed)
 Pt states she has arthritis but she has had neck pain which is new since Tuesday. She has been taking tylenol  arthritis which does help only for a little while.

## 2024-01-05 ENCOUNTER — Other Ambulatory Visit: Payer: Self-pay | Admitting: Family Medicine

## 2024-01-10 ENCOUNTER — Other Ambulatory Visit (HOSPITAL_COMMUNITY): Payer: Self-pay

## 2024-01-10 ENCOUNTER — Encounter: Payer: Self-pay | Admitting: Family Medicine

## 2024-01-10 ENCOUNTER — Ambulatory Visit (HOSPITAL_COMMUNITY)
Admission: RE | Admit: 2024-01-10 | Discharge: 2024-01-10 | Disposition: A | Discharge status: Home / Self Care | Source: Ambulatory Visit | Attending: Family Medicine | Admitting: Family Medicine

## 2024-01-10 ENCOUNTER — Telehealth: Payer: Self-pay | Admitting: Pharmacy Technician

## 2024-01-10 ENCOUNTER — Ambulatory Visit: Payer: Self-pay | Admitting: Family Medicine

## 2024-01-10 ENCOUNTER — Ambulatory Visit (INDEPENDENT_AMBULATORY_CARE_PROVIDER_SITE_OTHER): Admitting: Family Medicine

## 2024-01-10 VITALS — BP 128/80 | HR 90 | Resp 16 | Ht 64.0 in | Wt 141.1 lb

## 2024-01-10 DIAGNOSIS — R748 Abnormal levels of other serum enzymes: Secondary | ICD-10-CM | POA: Diagnosis present

## 2024-01-10 DIAGNOSIS — R634 Abnormal weight loss: Secondary | ICD-10-CM | POA: Insufficient documentation

## 2024-01-10 DIAGNOSIS — M170 Bilateral primary osteoarthritis of knee: Secondary | ICD-10-CM

## 2024-01-10 DIAGNOSIS — M25462 Effusion, left knee: Secondary | ICD-10-CM | POA: Diagnosis not present

## 2024-01-10 DIAGNOSIS — R109 Unspecified abdominal pain: Secondary | ICD-10-CM | POA: Diagnosis not present

## 2024-01-10 DIAGNOSIS — J309 Allergic rhinitis, unspecified: Secondary | ICD-10-CM

## 2024-01-10 DIAGNOSIS — M47812 Spondylosis without myelopathy or radiculopathy, cervical region: Secondary | ICD-10-CM | POA: Diagnosis not present

## 2024-01-10 DIAGNOSIS — Z1152 Encounter for screening for COVID-19: Secondary | ICD-10-CM | POA: Diagnosis not present

## 2024-01-10 DIAGNOSIS — I700 Atherosclerosis of aorta: Secondary | ICD-10-CM | POA: Diagnosis not present

## 2024-01-10 DIAGNOSIS — M79605 Pain in left leg: Secondary | ICD-10-CM | POA: Diagnosis not present

## 2024-01-10 DIAGNOSIS — D72829 Elevated white blood cell count, unspecified: Secondary | ICD-10-CM | POA: Diagnosis not present

## 2024-01-10 DIAGNOSIS — R918 Other nonspecific abnormal finding of lung field: Secondary | ICD-10-CM | POA: Diagnosis not present

## 2024-01-10 DIAGNOSIS — M542 Cervicalgia: Secondary | ICD-10-CM | POA: Insufficient documentation

## 2024-01-10 DIAGNOSIS — Z87891 Personal history of nicotine dependence: Secondary | ICD-10-CM | POA: Diagnosis not present

## 2024-01-10 DIAGNOSIS — M6282 Rhabdomyolysis: Secondary | ICD-10-CM | POA: Diagnosis present

## 2024-01-10 DIAGNOSIS — E559 Vitamin D deficiency, unspecified: Secondary | ICD-10-CM | POA: Diagnosis not present

## 2024-01-10 DIAGNOSIS — W19XXXA Unspecified fall, initial encounter: Secondary | ICD-10-CM | POA: Diagnosis present

## 2024-01-10 DIAGNOSIS — R7303 Prediabetes: Secondary | ICD-10-CM | POA: Diagnosis not present

## 2024-01-10 DIAGNOSIS — M1712 Unilateral primary osteoarthritis, left knee: Secondary | ICD-10-CM | POA: Diagnosis not present

## 2024-01-10 DIAGNOSIS — Z7982 Long term (current) use of aspirin: Secondary | ICD-10-CM | POA: Diagnosis not present

## 2024-01-10 DIAGNOSIS — E876 Hypokalemia: Secondary | ICD-10-CM | POA: Diagnosis present

## 2024-01-10 DIAGNOSIS — R7401 Elevation of levels of liver transaminase levels: Secondary | ICD-10-CM | POA: Diagnosis present

## 2024-01-10 DIAGNOSIS — Z6830 Body mass index (BMI) 30.0-30.9, adult: Secondary | ICD-10-CM | POA: Diagnosis not present

## 2024-01-10 DIAGNOSIS — Z823 Family history of stroke: Secondary | ICD-10-CM | POA: Diagnosis not present

## 2024-01-10 DIAGNOSIS — J984 Other disorders of lung: Secondary | ICD-10-CM | POA: Diagnosis not present

## 2024-01-10 DIAGNOSIS — I2510 Atherosclerotic heart disease of native coronary artery without angina pectoris: Secondary | ICD-10-CM | POA: Diagnosis not present

## 2024-01-10 DIAGNOSIS — I10 Essential (primary) hypertension: Secondary | ICD-10-CM

## 2024-01-10 DIAGNOSIS — D539 Nutritional anemia, unspecified: Secondary | ICD-10-CM | POA: Diagnosis present

## 2024-01-10 DIAGNOSIS — M4802 Spinal stenosis, cervical region: Secondary | ICD-10-CM | POA: Diagnosis not present

## 2024-01-10 DIAGNOSIS — D519 Vitamin B12 deficiency anemia, unspecified: Secondary | ICD-10-CM | POA: Diagnosis not present

## 2024-01-10 DIAGNOSIS — E7800 Pure hypercholesterolemia, unspecified: Secondary | ICD-10-CM

## 2024-01-10 DIAGNOSIS — M79661 Pain in right lower leg: Secondary | ICD-10-CM | POA: Diagnosis not present

## 2024-01-10 DIAGNOSIS — K5730 Diverticulosis of large intestine without perforation or abscess without bleeding: Secondary | ICD-10-CM | POA: Diagnosis not present

## 2024-01-10 DIAGNOSIS — R531 Weakness: Secondary | ICD-10-CM | POA: Diagnosis not present

## 2024-01-10 DIAGNOSIS — M19012 Primary osteoarthritis, left shoulder: Secondary | ICD-10-CM | POA: Diagnosis not present

## 2024-01-10 DIAGNOSIS — E785 Hyperlipidemia, unspecified: Secondary | ICD-10-CM | POA: Diagnosis present

## 2024-01-10 DIAGNOSIS — Z888 Allergy status to other drugs, medicaments and biological substances status: Secondary | ICD-10-CM | POA: Diagnosis not present

## 2024-01-10 DIAGNOSIS — E538 Deficiency of other specified B group vitamins: Secondary | ICD-10-CM | POA: Diagnosis present

## 2024-01-10 DIAGNOSIS — M25461 Effusion, right knee: Secondary | ICD-10-CM | POA: Diagnosis not present

## 2024-01-10 DIAGNOSIS — R7989 Other specified abnormal findings of blood chemistry: Secondary | ICD-10-CM | POA: Diagnosis present

## 2024-01-10 DIAGNOSIS — Z79899 Other long term (current) drug therapy: Secondary | ICD-10-CM | POA: Diagnosis not present

## 2024-01-10 DIAGNOSIS — M25422 Effusion, left elbow: Secondary | ICD-10-CM | POA: Diagnosis not present

## 2024-01-10 DIAGNOSIS — Z833 Family history of diabetes mellitus: Secondary | ICD-10-CM | POA: Diagnosis not present

## 2024-01-10 DIAGNOSIS — M79662 Pain in left lower leg: Secondary | ICD-10-CM | POA: Diagnosis not present

## 2024-01-10 DIAGNOSIS — J189 Pneumonia, unspecified organism: Secondary | ICD-10-CM | POA: Diagnosis present

## 2024-01-10 DIAGNOSIS — Z043 Encounter for examination and observation following other accident: Secondary | ICD-10-CM | POA: Diagnosis not present

## 2024-01-10 DIAGNOSIS — E43 Unspecified severe protein-calorie malnutrition: Secondary | ICD-10-CM | POA: Diagnosis not present

## 2024-01-10 DIAGNOSIS — M1711 Unilateral primary osteoarthritis, right knee: Secondary | ICD-10-CM | POA: Diagnosis not present

## 2024-01-10 DIAGNOSIS — M25522 Pain in left elbow: Secondary | ICD-10-CM | POA: Diagnosis not present

## 2024-01-10 DIAGNOSIS — M65961 Unspecified synovitis and tenosynovitis, right lower leg: Secondary | ICD-10-CM | POA: Diagnosis not present

## 2024-01-10 DIAGNOSIS — M6281 Muscle weakness (generalized): Secondary | ICD-10-CM | POA: Diagnosis not present

## 2024-01-10 DIAGNOSIS — R2689 Other abnormalities of gait and mobility: Secondary | ICD-10-CM | POA: Diagnosis not present

## 2024-01-10 DIAGNOSIS — N83202 Unspecified ovarian cyst, left side: Secondary | ICD-10-CM | POA: Diagnosis present

## 2024-01-10 DIAGNOSIS — E669 Obesity, unspecified: Secondary | ICD-10-CM | POA: Diagnosis present

## 2024-01-10 DIAGNOSIS — R9082 White matter disease, unspecified: Secondary | ICD-10-CM | POA: Diagnosis not present

## 2024-01-10 DIAGNOSIS — M79604 Pain in right leg: Secondary | ICD-10-CM | POA: Diagnosis not present

## 2024-01-10 DIAGNOSIS — Z86718 Personal history of other venous thrombosis and embolism: Secondary | ICD-10-CM | POA: Diagnosis not present

## 2024-01-10 MED ORDER — MONTELUKAST SODIUM 10 MG PO TABS: 10.0000 mg | ORAL_TABLET | Freq: Every day | ORAL | 5 refills | Status: DC

## 2024-01-10 MED ORDER — LEVOCETIRIZINE DIHYDROCHLORIDE 5 MG PO TABS: ORAL_TABLET | ORAL | 5 refills | Status: DC

## 2024-01-10 MED ORDER — CYCLOBENZAPRINE HCL 5 MG PO TABS: ORAL_TABLET | ORAL | 0 refills | Status: DC

## 2024-01-10 MED ORDER — METHYLPREDNISOLONE ACETATE 80 MG/ML IJ SUSP
40.0000 mg | Freq: Once | INTRAMUSCULAR | Status: AC
  Administered 2024-01-10: 40 mg via INTRAMUSCULAR

## 2024-01-10 MED ORDER — PREDNISONE 5 MG PO TABS: 5.0000 mg | ORAL_TABLET | Freq: Two times a day (BID) | ORAL | 0 refills | Status: DC

## 2024-01-10 NOTE — Assessment & Plan Note (Signed)
 Worsening with in stab ility, depo medrol  40 mg iM and 5 day course of pred twice daily , 5 mg and Ortho referral

## 2024-01-10 NOTE — Patient Instructions (Addendum)
 F/U in 4 to 5 weeks, call if you need me sooner.  For uncontrolled allergies increase Zyrtec  to twice daily and add montelukast  1 daily.  Blood pressure is well controlled on current meds  For uncontrolled neck pain radiating down shoulders into upper upper and lower arms and hands, Depo-Medrol  40 mg IM in the office to be followed by prednisone  5 mg twice daily for 5 days only.  For neck spasm, Flexeril  1 twice daily for 5 days and at bedtime only as needed.  You are referred to orthopedics regarding uncontrolled neck pain as well as bilateral knee pain with instability.  Please get x-ray of your neck today.  You will be called with results.  Labs today CBC Chem-7 and EGFR TSH and vitamin D  levels.  It is important that you eat regularly healthy food as you have lost a significant amount of weight in a short period of time and your niece who is with you reports that you really eat very little.  To be healthy and feel better you do need to eat on a regular schedule.

## 2024-01-10 NOTE — Assessment & Plan Note (Signed)
 Patient educated about the importance of limiting  Carbohydrate intake , the need to commit to daily physical activity for a minimum of 30 minutes , and to commit weight loss. The fact that changes in all these areas will reduce or eliminate all together the development of diabetes is stressed.      Latest Ref Rng & Units 10/05/2023    8:41 AM 06/27/2023    9:15 AM 06/27/2023    9:14 AM 06/09/2023   10:55 AM 04/11/2023    8:52 AM  Diabetic Labs  Chol 100 - 199 mg/dL   161   096   HDL >04 mg/dL   40   44   Calc LDL 0 - 99 mg/dL   95   540   Triglycerides 0 - 149 mg/dL 98  981  191  CANCELED  132   Creatinine 0.57 - 1.00 mg/dL   4.78   2.95       02/10/3085    9:31 AM 01/10/2024    9:01 AM 12/23/2023   12:59 PM 11/23/2023   11:12 AM 11/23/2023   11:05 AM 11/23/2023   10:33 AM 09/20/2023   11:12 AM  BP/Weight  Systolic BP 128 135 148 148 154 169 154  Diastolic BP 80 72 78 70 70 80 77  Wt. (Lbs)  141.08    159.04   BMI  24.22 kg/m2    27.3 kg/m2        No data to display

## 2024-01-10 NOTE — Progress Notes (Signed)
 Kristen Cox     MRN: [id number]      DOB: [birth year]  Chief Complaint  Patient presents with   Hypertension    6 week follow up    Neck Pain    Was seen in UC on 05/02 for bilateral neck pain x2 weeks. Still no better. States very stiff and pain radiates down both shoulders.     HPI Ms. Kristen Cox is here for follow up and re-evaluation of chronic medical conditions, medication management and review of any available recent lab and radiology data.  Preventive health is updated, specifically  Cancer screening and Immunization.   Questions or concerns regarding consultations or procedures which the PT has had in the interim are  addressed. Concerns as above   ROS Denies recent fever or chills. Increased nasal drainage, watery eyes and cough, no fever , chills or sputum. Denies chest congestion, productive cough or wheezing. Denies chest pains, palpitations and leg swelling Denies abdominal pain, nausea, vomiting,diarrhea or constipation.   Denies dysuria, frequency, hesitancy or incontinence.  Denies headaches, seizures, numbness, or tingling. Denies depression, anxiety or insomnia. Denies skin break down or rash.   PE  BP 128/80   Pulse 90   Resp 16   Ht 5\' 4"  (1.626 m)   Wt 141 lb 1.3 oz (64 kg)   SpO2 94%   BMI 24.22 kg/m   Patient alert and oriented and in no cardiopulmonary distress.  HEENT: No facial asymmetry, EOMI, Nasal congestion , no sinus tenderness    Neckdecreased ROM with left spasmChest: Clear to auscultation bilaterally.  CVS: S1, S2 no murmurs, no S3.Regular rate.  ABD: Soft non tender.   Ext: No edema  MS: decreased  ROM spine, shoulders, hips and knees.  Skin: Intact, no ulcerations or rash noted.  Psych: Good eye contact, normal affect. Memory intact not anxious or depressed appearing.  CNS: CN 2-12 intact, power,  normal throughout.no focal deficits noted.   Assessment & Plan  Allergic rhinitis Uncontrolled inc zyrtec  to twice daily  and add singulair   Neck pain Rated at 10 x 2 weeks radiaites down hands Depo medrol  40 mg I'm , x ray , pred x 5 days , ortho eval  Osteoarthritis of both knees Reports worsening pain and instability refer Ortho  Arthritis of both knees Worsening with in stab ility, depo medrol  40 mg iM and 5 day course of pred twice daily , 5 mg and Ortho referral  Hyperlipemia Controlled, no change in medication   Essential hypertension Controlled, no change in medication DASH diet and commitment to daily physical activity for a minimum of 30 minutes discussed and encouraged, as a part of hypertension management. The importance of attaining a healthy weight is also discussed.     01/10/2024    9:31 AM 01/10/2024    9:01 AM 12/23/2023   12:59 PM 11/23/2023   11:12 AM 11/23/2023   11:05 AM 11/23/2023   10:33 AM 09/20/2023   11:12 AM  BP/Weight  Systolic BP 128 135 148 148 154 169 154  Diastolic BP 80 72 78 70 70 80 77  Wt. (Lbs)  141.08    159.04   BMI  24.22 kg/m2    27.3 kg/m2        Weight loss, abnormal 19 pound weight loss noted in 2 months, reportedly eats very l little needs to change this  as weight loss excessive, update labs today  Prediabetes Patient educated about the importance of limiting  Carbohydrate  intake , the need to commit to daily physical activity for a minimum of 30 minutes , and to commit weight loss. The fact that changes in all these areas will reduce or eliminate all together the development of diabetes is stressed.      Latest Ref Rng & Units 10/05/2023    8:41 AM 06/27/2023    9:15 AM 06/27/2023    9:14 AM 06/09/2023   10:55 AM 04/11/2023    8:52 AM  Diabetic Labs  Chol 100 - 199 mg/dL   409   811   HDL >91 mg/dL   40   44   Calc LDL 0 - 99 mg/dL   95   478   Triglycerides 0 - 149 mg/dL 98  295  621  CANCELED  132   Creatinine 0.57 - 1.00 mg/dL   3.08   6.57       8/46/9629    9:31 AM 01/10/2024    9:01 AM 12/23/2023   12:59 PM 11/23/2023   11:12 AM 11/23/2023    11:05 AM 11/23/2023   10:33 AM 09/20/2023   11:12 AM  BP/Weight  Systolic BP 128 135 148 148 154 169 154  Diastolic BP 80 72 78 70 70 80 77  Wt. (Lbs)  141.08    159.04   BMI  24.22 kg/m2    27.3 kg/m2        No data to display

## 2024-01-10 NOTE — Assessment & Plan Note (Signed)
 Uncontrolled inc zyrtec  to twice daily and add singulair 

## 2024-01-10 NOTE — Assessment & Plan Note (Signed)
 Controlled, no change in medication

## 2024-01-10 NOTE — Telephone Encounter (Signed)
 Pharmacy Patient Advocate Encounter   Received notification from CoverMyMeds that prior authorization for Levocetirizine Dihydrochloride  5MG  tablets is required/requested.   Insurance verification completed.   The patient is insured through [place] .   Per test claim: PA required; PA submitted to above mentioned insurance via CoverMyMeds Key/confirmation #/E[facility] Status is pending  This is a request for a quantity limit exception. Pt needs #60/30 days and plan allowable is #30/30 days.

## 2024-01-10 NOTE — Assessment & Plan Note (Signed)
 Reports worsening pain and instability refer Ortho

## 2024-01-10 NOTE — Telephone Encounter (Signed)
 Pharmacy Patient Advocate Encounter  Received notification from [employer] that Prior Authorization for Levocetirizine Dihydrochloride  5MG  tablets has been APPROVED from 01/10/2024 to 08/22/2024. Ran test claim, Copay is $0.00. This test claim was processed through [facility]- copay amounts may vary at other pharmacies due to pharmacy/plan contracts, or as the patient moves through the different stages of their insurance plan.   PA #/Case ID/Reference #: [id number]

## 2024-01-10 NOTE — Assessment & Plan Note (Signed)
 Controlled, no change in medication DASH diet and commitment to daily physical activity for a minimum of 30 minutes discussed and encouraged, as a part of hypertension management. The importance of attaining a healthy weight is also discussed.     01/10/2024    9:31 AM 01/10/2024    9:01 AM 12/23/2023   12:59 PM 11/23/2023   11:12 AM 11/23/2023   11:05 AM 11/23/2023   10:33 AM 09/20/2023   11:12 AM  BP/Weight  Systolic BP 128 135 148 148 154 169 154  Diastolic BP 80 72 78 70 70 80 77  Wt. (Lbs)  141.08    159.04   BMI  24.22 kg/m2    27.3 kg/m2

## 2024-01-10 NOTE — Assessment & Plan Note (Signed)
 Rated at 10 x 2 weeks radiaites down hands Depo medrol  40 mg I'm , x ray , pred x 5 days , ortho eval

## 2024-01-10 NOTE — Assessment & Plan Note (Signed)
 19 pound weight loss noted in 2 months, reportedly eats very l little needs to change this  as weight loss excessive, update labs today

## 2024-01-11 ENCOUNTER — Other Ambulatory Visit: Payer: Self-pay

## 2024-01-11 DIAGNOSIS — D619 Aplastic anemia, unspecified: Secondary | ICD-10-CM

## 2024-01-11 LAB — CBC WITH DIFFERENTIAL/PLATELET
Basophils Absolute: 0 10*3/uL (ref 0.0–0.2)
Basos: 0 %
EOS (ABSOLUTE): 0 10*3/uL (ref 0.0–0.4)
Eos: 0 %
Hematocrit: 32.3 % — ABNORMAL LOW (ref 34.0–46.6)
Hemoglobin: 10.7 g/dL — ABNORMAL LOW (ref 11.1–15.9)
Immature Grans (Abs): 0 10*3/uL (ref 0.0–0.1)
Immature Granulocytes: 0 %
Lymphocytes Absolute: 2.7 10*3/uL (ref 0.7–3.1)
Lymphs: 30 %
MCH: 37.2 pg — ABNORMAL HIGH (ref 26.6–33.0)
MCHC: 33.1 g/dL (ref 31.5–35.7)
MCV: 112 fL — ABNORMAL HIGH (ref 79–97)
Monocytes Absolute: 1 10*3/uL — ABNORMAL HIGH (ref 0.1–0.9)
Monocytes: 11 %
Neutrophils Absolute: 5.2 10*3/uL (ref 1.4–7.0)
Neutrophils: 59 %
Platelets: 608 10*3/uL — ABNORMAL HIGH (ref 150–450)
RBC: 2.88 x10E6/uL — ABNORMAL LOW (ref 3.77–5.28)
RDW: 14.3 % (ref 11.7–15.4)
WBC: 8.9 10*3/uL (ref 3.4–10.8)

## 2024-01-11 LAB — CMP14+EGFR
ALT: 16 IU/L (ref 0–32)
AST: 22 IU/L (ref 0–40)
Albumin: 3.9 g/dL (ref 3.7–4.7)
Alkaline Phosphatase: 100 IU/L (ref 44–121)
BUN/Creatinine Ratio: 15 (ref 12–28)
BUN: 11 mg/dL (ref 8–27)
Bilirubin Total: 0.4 mg/dL (ref 0.0–1.2)
CO2: 22 mmol/L (ref 20–29)
Calcium: 10.3 mg/dL (ref 8.7–10.3)
Chloride: 103 mmol/L (ref 96–106)
Creatinine, Ser: 0.71 mg/dL (ref 0.57–1.00)
Globulin, Total: 2.9 g/dL (ref 1.5–4.5)
Glucose: 91 mg/dL (ref 70–99)
Potassium: 5 mmol/L (ref 3.5–5.2)
Sodium: 139 mmol/L (ref 134–144)
Total Protein: 6.8 g/dL (ref 6.0–8.5)
eGFR: 85 mL/min/{1.73_m2} (ref >59)

## 2024-01-11 LAB — VITAMIN D 25 HYDROXY (VIT D DEFICIENCY, FRACTURES): Vit D, 25-Hydroxy: 50.6 ng/mL (ref 30.0–100.0)

## 2024-01-11 LAB — TSH: TSH: 1.52 u[IU]/mL (ref 0.450–4.500)

## 2024-01-13 ENCOUNTER — Emergency Department (HOSPITAL_COMMUNITY)

## 2024-01-13 ENCOUNTER — Encounter (HOSPITAL_COMMUNITY): Payer: Self-pay | Admitting: Emergency Medicine

## 2024-01-13 ENCOUNTER — Other Ambulatory Visit: Payer: Self-pay

## 2024-01-13 ENCOUNTER — Ambulatory Visit: Payer: Self-pay

## 2024-01-13 ENCOUNTER — Inpatient Hospital Stay (HOSPITAL_COMMUNITY)
Admission: EM | Admit: 2024-01-13 | Discharge: 2024-01-19 | DRG: 194 | Disposition: A | Discharge status: Skilled Nursing Facility | Attending: Internal Medicine | Admitting: Internal Medicine

## 2024-01-13 DIAGNOSIS — M170 Bilateral primary osteoarthritis of knee: Secondary | ICD-10-CM | POA: Diagnosis present

## 2024-01-13 DIAGNOSIS — R7401 Elevation of levels of liver transaminase levels: Secondary | ICD-10-CM | POA: Diagnosis present

## 2024-01-13 DIAGNOSIS — Z7982 Long term (current) use of aspirin: Secondary | ICD-10-CM

## 2024-01-13 DIAGNOSIS — Z86718 Personal history of other venous thrombosis and embolism: Secondary | ICD-10-CM | POA: Diagnosis not present

## 2024-01-13 DIAGNOSIS — Z6830 Body mass index (BMI) 30.0-30.9, adult: Secondary | ICD-10-CM | POA: Diagnosis not present

## 2024-01-13 DIAGNOSIS — Z043 Encounter for examination and observation following other accident: Secondary | ICD-10-CM | POA: Diagnosis not present

## 2024-01-13 DIAGNOSIS — J189 Pneumonia, unspecified organism: Principal | ICD-10-CM | POA: Diagnosis present

## 2024-01-13 DIAGNOSIS — Z1152 Encounter for screening for COVID-19: Secondary | ICD-10-CM

## 2024-01-13 DIAGNOSIS — E876 Hypokalemia: Secondary | ICD-10-CM | POA: Diagnosis present

## 2024-01-13 DIAGNOSIS — D539 Nutritional anemia, unspecified: Secondary | ICD-10-CM | POA: Diagnosis present

## 2024-01-13 DIAGNOSIS — R748 Abnormal levels of other serum enzymes: Secondary | ICD-10-CM | POA: Diagnosis present

## 2024-01-13 DIAGNOSIS — Z888 Allergy status to other drugs, medicaments and biological substances status: Secondary | ICD-10-CM

## 2024-01-13 DIAGNOSIS — R2689 Other abnormalities of gait and mobility: Secondary | ICD-10-CM | POA: Diagnosis not present

## 2024-01-13 DIAGNOSIS — M25522 Pain in left elbow: Secondary | ICD-10-CM | POA: Diagnosis not present

## 2024-01-13 DIAGNOSIS — M79662 Pain in left lower leg: Secondary | ICD-10-CM | POA: Diagnosis not present

## 2024-01-13 DIAGNOSIS — D72829 Elevated white blood cell count, unspecified: Secondary | ICD-10-CM | POA: Diagnosis not present

## 2024-01-13 DIAGNOSIS — Z87891 Personal history of nicotine dependence: Secondary | ICD-10-CM

## 2024-01-13 DIAGNOSIS — M6282 Rhabdomyolysis: Secondary | ICD-10-CM

## 2024-01-13 DIAGNOSIS — R918 Other nonspecific abnormal finding of lung field: Secondary | ICD-10-CM | POA: Diagnosis not present

## 2024-01-13 DIAGNOSIS — N83202 Unspecified ovarian cyst, left side: Secondary | ICD-10-CM | POA: Diagnosis present

## 2024-01-13 DIAGNOSIS — R7989 Other specified abnormal findings of blood chemistry: Secondary | ICD-10-CM | POA: Diagnosis present

## 2024-01-13 DIAGNOSIS — Z823 Family history of stroke: Secondary | ICD-10-CM | POA: Diagnosis not present

## 2024-01-13 DIAGNOSIS — Z79899 Other long term (current) drug therapy: Secondary | ICD-10-CM | POA: Diagnosis not present

## 2024-01-13 DIAGNOSIS — E538 Deficiency of other specified B group vitamins: Secondary | ICD-10-CM | POA: Diagnosis present

## 2024-01-13 DIAGNOSIS — M25461 Effusion, right knee: Secondary | ICD-10-CM | POA: Diagnosis not present

## 2024-01-13 DIAGNOSIS — E43 Unspecified severe protein-calorie malnutrition: Secondary | ICD-10-CM | POA: Diagnosis not present

## 2024-01-13 DIAGNOSIS — I10 Essential (primary) hypertension: Secondary | ICD-10-CM | POA: Diagnosis present

## 2024-01-13 DIAGNOSIS — M542 Cervicalgia: Secondary | ICD-10-CM | POA: Diagnosis present

## 2024-01-13 DIAGNOSIS — R109 Unspecified abdominal pain: Secondary | ICD-10-CM | POA: Diagnosis not present

## 2024-01-13 DIAGNOSIS — K5730 Diverticulosis of large intestine without perforation or abscess without bleeding: Secondary | ICD-10-CM | POA: Diagnosis not present

## 2024-01-13 DIAGNOSIS — R531 Weakness: Principal | ICD-10-CM | POA: Diagnosis present

## 2024-01-13 DIAGNOSIS — M19012 Primary osteoarthritis, left shoulder: Secondary | ICD-10-CM | POA: Diagnosis not present

## 2024-01-13 DIAGNOSIS — I2510 Atherosclerotic heart disease of native coronary artery without angina pectoris: Secondary | ICD-10-CM | POA: Diagnosis not present

## 2024-01-13 DIAGNOSIS — M1712 Unilateral primary osteoarthritis, left knee: Secondary | ICD-10-CM | POA: Diagnosis not present

## 2024-01-13 DIAGNOSIS — M79661 Pain in right lower leg: Secondary | ICD-10-CM | POA: Diagnosis not present

## 2024-01-13 DIAGNOSIS — Z833 Family history of diabetes mellitus: Secondary | ICD-10-CM

## 2024-01-13 DIAGNOSIS — M79605 Pain in left leg: Secondary | ICD-10-CM | POA: Diagnosis not present

## 2024-01-13 DIAGNOSIS — M25462 Effusion, left knee: Secondary | ICD-10-CM | POA: Diagnosis not present

## 2024-01-13 DIAGNOSIS — E785 Hyperlipidemia, unspecified: Secondary | ICD-10-CM | POA: Diagnosis present

## 2024-01-13 DIAGNOSIS — J984 Other disorders of lung: Secondary | ICD-10-CM | POA: Diagnosis not present

## 2024-01-13 DIAGNOSIS — R262 Difficulty in walking, not elsewhere classified: Secondary | ICD-10-CM

## 2024-01-13 DIAGNOSIS — E669 Obesity, unspecified: Secondary | ICD-10-CM | POA: Diagnosis present

## 2024-01-13 DIAGNOSIS — J181 Lobar pneumonia, unspecified organism: Secondary | ICD-10-CM | POA: Insufficient documentation

## 2024-01-13 DIAGNOSIS — W19XXXA Unspecified fall, initial encounter: Secondary | ICD-10-CM | POA: Diagnosis present

## 2024-01-13 DIAGNOSIS — R9082 White matter disease, unspecified: Secondary | ICD-10-CM | POA: Diagnosis not present

## 2024-01-13 DIAGNOSIS — M79604 Pain in right leg: Secondary | ICD-10-CM | POA: Diagnosis not present

## 2024-01-13 DIAGNOSIS — I700 Atherosclerosis of aorta: Secondary | ICD-10-CM | POA: Diagnosis not present

## 2024-01-13 DIAGNOSIS — M6281 Muscle weakness (generalized): Secondary | ICD-10-CM | POA: Diagnosis not present

## 2024-01-13 DIAGNOSIS — Z9181 History of falling: Secondary | ICD-10-CM

## 2024-01-13 DIAGNOSIS — M25422 Effusion, left elbow: Secondary | ICD-10-CM | POA: Diagnosis not present

## 2024-01-13 DIAGNOSIS — Z860100 Personal history of colon polyps, unspecified: Secondary | ICD-10-CM

## 2024-01-13 DIAGNOSIS — M1711 Unilateral primary osteoarthritis, right knee: Secondary | ICD-10-CM | POA: Diagnosis not present

## 2024-01-13 DIAGNOSIS — D519 Vitamin B12 deficiency anemia, unspecified: Secondary | ICD-10-CM | POA: Diagnosis not present

## 2024-01-13 HISTORY — DX: Pneumonia, unspecified organism: J18.9

## 2024-01-13 HISTORY — DX: Rhabdomyolysis: M62.82

## 2024-01-13 LAB — CBC WITH DIFFERENTIAL/PLATELET
Abs Immature Granulocytes: 0.05 10*3/uL (ref 0.00–0.07)
Basophils Absolute: 0 10*3/uL (ref 0.0–0.1)
Basophils Relative: 0 %
Eosinophils Absolute: 0 10*3/uL (ref 0.0–0.5)
Eosinophils Relative: 0 %
HCT: 31.1 % — ABNORMAL LOW (ref 36.0–46.0)
Hemoglobin: 10.6 g/dL — ABNORMAL LOW (ref 12.0–15.0)
Immature Granulocytes: 0 %
Lymphocytes Relative: 17 %
Lymphs Abs: 2.3 10*3/uL (ref 0.7–4.0)
MCH: 37.3 pg — ABNORMAL HIGH (ref 26.0–34.0)
MCHC: 34.1 g/dL (ref 30.0–36.0)
MCV: 109.5 fL — ABNORMAL HIGH (ref 80.0–100.0)
Monocytes Absolute: 1.7 10*3/uL — ABNORMAL HIGH (ref 0.1–1.0)
Monocytes Relative: 13 %
Neutro Abs: 9.6 10*3/uL — ABNORMAL HIGH (ref 1.7–7.7)
Neutrophils Relative %: 70 %
Platelets: 481 10*3/uL — ABNORMAL HIGH (ref 150–400)
RBC: 2.84 MIL/uL — ABNORMAL LOW (ref 3.87–5.11)
RDW: 14.9 % (ref 11.5–15.5)
WBC: 13.6 10*3/uL — ABNORMAL HIGH (ref 4.0–10.5)
nRBC: 0.2 % (ref 0.0–0.2)

## 2024-01-13 LAB — COMPREHENSIVE METABOLIC PANEL WITH GFR
ALT: 61 U/L — ABNORMAL HIGH (ref 0–44)
AST: 74 U/L — ABNORMAL HIGH (ref 15–41)
Albumin: 3.4 g/dL — ABNORMAL LOW (ref 3.5–5.0)
Alkaline Phosphatase: 71 U/L (ref 38–126)
Anion gap: 7 (ref 5–15)
BUN: 17 mg/dL (ref 8–23)
CO2: 24 mmol/L (ref 22–32)
Calcium: 9.7 mg/dL (ref 8.9–10.3)
Chloride: 104 mmol/L (ref 98–111)
Creatinine, Ser: 0.66 mg/dL (ref 0.44–1.00)
GFR, Estimated: >60 mL/min (ref >60)
Glucose, Bld: 106 mg/dL — ABNORMAL HIGH (ref 70–99)
Potassium: 3.5 mmol/L (ref 3.5–5.1)
Sodium: 135 mmol/L (ref 135–145)
Total Bilirubin: 0.6 mg/dL (ref 0.0–1.2)
Total Protein: 7.1 g/dL (ref 6.5–8.1)

## 2024-01-13 LAB — CK: Total CK: 2007 U/L — ABNORMAL HIGH (ref 38–234)

## 2024-01-13 LAB — TROPONIN I (HIGH SENSITIVITY)
Troponin I (High Sensitivity): 54 ng/L — ABNORMAL HIGH (ref <18)
Troponin I (High Sensitivity): 48 ng/L — ABNORMAL HIGH (ref <18)

## 2024-01-13 LAB — IRON AND TIBC
Iron: 61 ug/dL (ref 28–170)
Saturation Ratios: 24 % (ref 10.4–31.8)
TIBC: 250 ug/dL (ref 250–450)
UIBC: 189 ug/dL

## 2024-01-13 LAB — VITAMIN B12: Vitamin B-12: 51 pg/mL — ABNORMAL LOW (ref 180–914)

## 2024-01-13 LAB — RETICULOCYTES
Immature Retic Fract: 17.3 % — ABNORMAL HIGH (ref 2.3–15.9)
RBC.: 2.82 MIL/uL — ABNORMAL LOW (ref 3.87–5.11)
Retic Count, Absolute: 94.8 10*3/uL (ref 19.0–186.0)
Retic Ct Pct: 3.4 % — ABNORMAL HIGH (ref 0.4–3.1)

## 2024-01-13 LAB — URINALYSIS, ROUTINE W REFLEX MICROSCOPIC
Bacteria, UA: NONE SEEN
Bilirubin Urine: NEGATIVE
Glucose, UA: NEGATIVE mg/dL
Hgb urine dipstick: NEGATIVE
Ketones, ur: NEGATIVE mg/dL
Nitrite: NEGATIVE
Protein, ur: NEGATIVE mg/dL
Specific Gravity, Urine: 1.008 (ref 1.005–1.030)
pH: 6 (ref 5.0–8.0)

## 2024-01-13 LAB — FOLATE: Folate: 19.3 ng/mL (ref >5.9)

## 2024-01-13 LAB — FERRITIN: Ferritin: 163 ng/mL (ref 11–307)

## 2024-01-13 MED ORDER — IOHEXOL 300 MG/ML  SOLN
100.0000 mL | Freq: Once | INTRAMUSCULAR | Status: AC | PRN
  Administered 2024-01-13: 100 mL via INTRAVENOUS

## 2024-01-13 MED ORDER — AMLODIPINE BESYLATE 5 MG PO TABS
2.5000 mg | ORAL_TABLET | Freq: Every day | ORAL | Status: DC
  Administered 2024-01-14 – 2024-01-19 (×6): 2.5 mg via ORAL
  Filled 2024-01-13 (×6): qty 1

## 2024-01-13 MED ORDER — HYDRALAZINE HCL 20 MG/ML IJ SOLN: 5.0000 mg | INTRAMUSCULAR | Status: DC | PRN

## 2024-01-13 MED ORDER — LACTATED RINGERS IV BOLUS
500.0000 mL | Freq: Once | INTRAVENOUS | Status: AC
  Administered 2024-01-13: 500 mL via INTRAVENOUS

## 2024-01-13 MED ORDER — ACETAMINOPHEN 325 MG PO TABS
650.0000 mg | ORAL_TABLET | Freq: Three times a day (TID) | ORAL | Status: DC | PRN
  Administered 2024-01-14 – 2024-01-15 (×2): 650 mg via ORAL
  Filled 2024-01-13 (×2): qty 2

## 2024-01-13 MED ORDER — LACTATED RINGERS IV SOLN: INTRAVENOUS | Status: AC

## 2024-01-13 MED ORDER — LORATADINE 10 MG PO TABS
10.0000 mg | ORAL_TABLET | Freq: Every evening | ORAL | Status: DC
  Administered 2024-01-13 – 2024-01-18 (×6): 10 mg via ORAL
  Filled 2024-01-13 (×6): qty 1

## 2024-01-13 MED ORDER — ASPIRIN 81 MG PO TBEC
81.0000 mg | DELAYED_RELEASE_TABLET | Freq: Every day | ORAL | Status: DC
  Administered 2024-01-14 – 2024-01-19 (×6): 81 mg via ORAL
  Filled 2024-01-13 (×6): qty 1

## 2024-01-13 MED ORDER — HEPARIN SODIUM (PORCINE) 5000 UNIT/ML IJ SOLN
5000.0000 [IU] | Freq: Three times a day (TID) | INTRAMUSCULAR | Status: DC
  Administered 2024-01-13 – 2024-01-14 (×3): 5000 [IU] via SUBCUTANEOUS
  Filled 2024-01-13 (×3): qty 1

## 2024-01-13 MED ORDER — ALBUTEROL SULFATE (2.5 MG/3ML) 0.083% IN NEBU: 2.5000 mg | INHALATION_SOLUTION | RESPIRATORY_TRACT | Status: DC | PRN

## 2024-01-13 MED ORDER — MONTELUKAST SODIUM 10 MG PO TABS
10.0000 mg | ORAL_TABLET | Freq: Every day | ORAL | Status: DC
  Administered 2024-01-13 – 2024-01-18 (×6): 10 mg via ORAL
  Filled 2024-01-13 (×6): qty 1

## 2024-01-13 MED ORDER — SODIUM CHLORIDE 0.9 % IV SOLN
1.0000 g | Freq: Once | INTRAVENOUS | Status: AC
  Administered 2024-01-13: 1 g via INTRAVENOUS
  Filled 2024-01-13: qty 10

## 2024-01-13 MED ORDER — LEVOCETIRIZINE DIHYDROCHLORIDE 5 MG PO TABS: 5.0000 mg | ORAL_TABLET | Freq: Every evening | ORAL | Status: DC

## 2024-01-13 MED ORDER — SODIUM CHLORIDE 0.9 % IV SOLN
500.0000 mg | INTRAVENOUS | Status: AC
  Administered 2024-01-14 – 2024-01-18 (×5): 500 mg via INTRAVENOUS
  Filled 2024-01-13 (×5): qty 5

## 2024-01-13 MED ORDER — SODIUM CHLORIDE 0.9 % IV SOLN
500.0000 mg | Freq: Once | INTRAVENOUS | Status: AC
  Administered 2024-01-13: 500 mg via INTRAVENOUS
  Filled 2024-01-13: qty 5

## 2024-01-13 MED ORDER — SODIUM CHLORIDE 0.9 % IV SOLN
2.0000 g | INTRAVENOUS | Status: AC
  Administered 2024-01-14 – 2024-01-18 (×5): 2 g via INTRAVENOUS
  Filled 2024-01-13 (×5): qty 20

## 2024-01-13 MED ORDER — CARVEDILOL 12.5 MG PO TABS
12.5000 mg | ORAL_TABLET | Freq: Two times a day (BID) | ORAL | Status: DC
  Administered 2024-01-13 – 2024-01-19 (×12): 12.5 mg via ORAL
  Filled 2024-01-13 (×14): qty 1

## 2024-01-13 NOTE — ED Triage Notes (Addendum)
 Pt brought in by family. Pt and family reports that she had fallen twice this morning.  Pt states that she tripped and fell onto her stomach and was unable to get up on her own. One unwitnessed fall, unknown down time, second fall was witnessed by son.  Denies head strike, denies blood thinners. Pt denies pain anywhere. Pt states that she has been feeling weak over the last few days in her legs but today was worse.   Pt is alert oriented in triage.

## 2024-01-13 NOTE — H&P (Signed)
 [facility] H&P   Patient Demographics:    Kristen Cox, is a 83 y.o. female  MRN: [id number]   DOB - April 06, 1941  Admit Date - 01/13/2024  Outpatient Primary MD for the patient is Towanda Fret, MD  Referring MD/NP/PA: PA Rigney  Patient coming from: Home  Chief Complaint  Patient presents with   Fall   Weakness      HPI:    Kristen Cox  is a 83 y.o. female, past medical history of hypertension, hyperlipidemia, she presents to ED due to fall, weakness, patient report progressive weakness over the last week, deconditioning, frailty, difficulty ambulating and walking, but she denies any focal deficits, she had 2 falls this morning, she denies loss of consciousness, head trauma, or any anticoagulation use, no chest pain, no shortness of breath, she does report cough and congestion over the last few days as well. - NAD chest x-ray significant for left lung base pneumonia with atelectasis, and white blood cell was elevated at 13.6, total CK was elevated at 2007, troponins were elevated 54> 48, but her EKG is nonacute and she denies any chest pain or shortness of breath, [facility] consulted to admit    Review of systems:      A full 10 point Review of Systems was done, except as stated above, all other Review of Systems were negative.   With Past History of the following :    Past Medical History:  Diagnosis Date   Deep vein thrombosis (DVT) (HCC) 08/2016   Right leg   Dry eyes 2010   on restasis - Dr. Barbra Boone    Gout 2009   Hyperlipidemia    Hypertension    Spinal stenosis       Past Surgical History:  Procedure Laterality Date   CESAREAN SECTION     COLONOSCOPY  04/09/2009   VQQ:VZDG papilla otherwise, normal rectum/Left-sided diverticula/Cecal polyp status post cold snare and removal/The remainder of the colonic mucosa appeared normal. Adenomatous polyps,  next TCS 03/2016 per RMR.   COLONOSCOPY N/A 05/19/2016   Procedure: COLONOSCOPY;  Surgeon: Suzette Espy, MD;  Location: [facility];  Service: Endoscopy;  Laterality: N/A;  1:00 PM      Social History:     Social History   Tobacco Use   Smoking status: Former    Current packs/day: 0.00    Average packs/day: 0.5 packs/day for 10.0 years (5.0 ttl pk-yrs)    Types: Cigarettes    Start date: 10/06/1973    Quit date: 10/07/1983    Years since quitting: 40.2   Smokeless tobacco: Never  Substance Use Topics   Alcohol use: No       Family History :     Family History  Problem Relation Age of Onset   Stroke Mother    Stroke Father    Diabetes Sister    Pulmonary embolism Sister  Cirrhosis Brother    Colon cancer Neg Hx       Home Medications:   Prior to Admission medications   Medication Sig Start Date End Date Taking? Authorizing Provider  acetaminophen  (TYLENOL ) 650 MG CR tablet Take 650 mg by mouth every 8 (eight) hours as needed for pain.   Yes [provider]  amLODipine  (NORVASC ) 2.5 MG tablet Take 1 tablet (2.5 mg total) by mouth daily. 09/20/23  Yes Towanda Fret, MD  aspirin EC 81 MG tablet Take 81 mg by mouth daily.   Yes [provider]  atorvastatin  (LIPITOR) 40 MG tablet TAKE 1 TABLET(40 MG) BY MOUTH DAILY 04/11/23  Yes Towanda Fret, MD  carvedilol  (COREG ) 12.5 MG tablet TAKE 1 TABLET(12.5 MG) BY MOUTH TWICE DAILY WITH A MEAL 01/05/24  Yes Towanda Fret, MD  cholecalciferol  (VITAMIN D3) 25 MCG (1000 UNIT) tablet Take 1,000 Units by mouth daily.   Yes [provider]  cyclobenzaprine  (FLEXERIL ) 5 MG tablet Take one tablet by mouth twice daily for 5 days, then one tablet by mouth at bedtime as needed for neck spasm 01/10/24  Yes Towanda Fret, MD  ezetimibe  (ZETIA ) 10 MG tablet Take 1 tablet (10 mg total) by mouth daily. 06/29/23 01/13/24 Yes Elmyra Haggard, MD  Flaxseed, Linseed, (FLAXSEED OIL) 1200 MG CAPS Take by  mouth.   Yes [provider]  levocetirizine (XYZAL ) 5 MG tablet Take one tablet by mouth two times daily 01/10/24  Yes Towanda Fret, MD  montelukast  (SINGULAIR ) 10 MG tablet Take 1 tablet (10 mg total) by mouth at bedtime. 01/10/24  Yes Towanda Fret, MD  olmesartan  (BENICAR ) 20 MG tablet TAKE 1 TABLET(20 MG) BY MOUTH DAILY 11/21/23  Yes Towanda Fret, MD  predniSONE  (DELTASONE ) 5 MG tablet Take 1 tablet (5 mg total) by mouth 2 (two) times daily for 5 days. 01/10/24 01/15/24 Yes Towanda Fret, MD     Allergies:     Allergies  Allergen Reactions   Amlodipine  Other (See Comments)    Excess cramping and stiffness of hands and legs   Benazepril Other (See Comments)    Patient does not recall being on this medication and/or what reaction she might have had.   Metoprolol  Other (See Comments)    CRAMPS CRAMPS CRAMPS     Physical Exam:   Vitals  Blood pressure 132/76, pulse 75, temperature 97.8 F (36.6 C), temperature source Oral, resp. rate 20, height 5\' 2"  (1.575 m), weight 64 kg, SpO2 98%.   1. General Frail elderly female, laying in bed, no apparent distress  2. Normal affect and insight, Not Suicidal or Homicidal, Awake Alert, Oriented X 3.  3. No F.N deficits, ALL C.Nerves Intact, Strength 5/5 all 4 extremities, Sensation intact all 4 extremities, Plantars down going.  4. Ears and Eyes appear Normal, Conjunctivae clear, PERRLA. Moist Oral Mucosa.  5. Supple Neck, No JVD, No cervical lymphadenopathy appriciated, No Carotid Bruits.  6. Symmetrical Chest wall movement, diminished air entry at the bases, left more than right, scattered Rales at left lung base 7. RRR, No Gallops, Rubs or Murmurs, No Parasternal Heave.  8. Positive Bowel Sounds, Abdomen Soft, No tenderness, No organomegaly appriciated,No rebound -guarding or rigidity.  9.  No Cyanosis, Normal Skin Turgor, No Skin Rash or Bruise.  10. Good muscle tone,  joints appear normal , no  effusions, Normal ROM.    Data Review:    CBC Recent Labs  Lab 01/10/24 [id number]  01/13/24 1041  WBC 8.9 13.6*  HGB 10.7* 10.6*  HCT 32.3* 31.1*  PLT 608* 481*  MCV 112* 109.5*  MCH 37.2* 37.3*  MCHC 33.1 34.1  RDW 14.3 14.9  LYMPHSABS 2.7 2.3  MONOABS  --  1.7*  EOSABS 0.0 0.0  BASOSABS 0.0 0.0   ------------------------------------------------------------------------------------------------------------------  Chemistries  Recent Labs  Lab 01/10/24 0948 01/13/24 1132  NA 139 135  K 5.0 3.5  CL 103 104  CO2 22 24  GLUCOSE 91 106*  BUN 11 17  CREATININE 0.71 0.66  CALCIUM  10.3 9.7  AST 22 74*  ALT 16 61*  ALKPHOS 100 71  BILITOT 0.4 0.6   ------------------------------------------------------------------------------------------------------------------ estimated creatinine clearance is 47.7 mL/min (by C-G formula based on SCr of 0.66 mg/dL). ------------------------------------------------------------------------------------------------------------------ No results for input(s): "TSH", "T4TOTAL", "T3FREE", "THYROIDAB" in the last 72 hours.  Invalid input(s): "FREET3"  Coagulation profile No results for input(s): "INR", "PROTIME" in the last 168 hours. ------------------------------------------------------------------------------------------------------------------- No results for input(s): "DDIMER" in the last 72 hours. -------------------------------------------------------------------------------------------------------------------  Cardiac Enzymes No results for input(s): "CKMB", "TROPONINI", "MYOGLOBIN" in the last 168 hours.  Invalid input(s): "CK" ------------------------------------------------------------------------------------------------------------------    Component Value Date/Time   BNP 40.0 01/20/2021 0826     ---------------------------------------------------------------------------------------------------------------  Urinalysis     Component Value Date/Time   COLORURINE STRAW (A) 01/13/2024 1139   APPEARANCEUR CLEAR 01/13/2024 1139   LABSPEC 1.008 01/13/2024 1139   PHURINE 6.0 01/13/2024 1139   GLUCOSEU NEGATIVE 01/13/2024 1139   HGBUR NEGATIVE 01/13/2024 1139   BILIRUBINUR NEGATIVE 01/13/2024 1139   KETONESUR NEGATIVE 01/13/2024 1139   PROTEINUR NEGATIVE 01/13/2024 1139   NITRITE NEGATIVE 01/13/2024 1139   LEUKOCYTESUR TRACE (A) 01/13/2024 1139    ----------------------------------------------------------------------------------------------------------------   Imaging Results:    CT ABDOMEN PELVIS W CONTRAST Result Date: 01/13/2024 CLINICAL DATA:  Fall with abdominal pain EXAM: CT ABDOMEN AND PELVIS WITH CONTRAST TECHNIQUE: Multidetector CT imaging of the abdomen and pelvis was performed using the standard protocol following bolus administration of intravenous contrast. RADIATION DOSE REDUCTION: This exam was performed according to the departmental dose-optimization program which includes automated exposure control, adjustment of the mA and/or kV according to patient size and/or use of iterative reconstruction technique. CONTRAST:  OMNIPAQUE  IOHEXOL  300 MG/ML  SOLN COMPARISON:  CT 05/21/2013, pelvic ultrasound 05/23/2013 FINDINGS: Lower chest: Lung bases demonstrate no acute airspace disease. Coronary vascular calcification. Partially visualized cystic and solid mass in the anterior mediastinum, measures 2.7 by 2.2 cm, previously 2.7 cm, and stable dating back to 08/26/2022 chest CT. Hepatobiliary: Multiple hepatic cysts. Dystrophic calcification within the cyst at the right posterior hepatic dome. No calcified gallstones. No biliary dilatation Pancreas: Unremarkable. No pancreatic ductal dilatation or surrounding inflammatory changes. Spleen: Splenic granuloma. Interval splenic atrophy compared with 2014 exam. Adrenals/Urinary Tract: Adrenal glands are normal. Kidneys show no hydronephrosis. Urinary bladder is  distended Stomach/Bowel: Stomach nonenlarged. There is no dilated small bowel. Diverticular disease of the left colon. Negative appendix. No acute bowel wall thickening Vascular/Lymphatic: Aortic atherosclerosis. No enlarged abdominal or pelvic lymph nodes. Reproductive: Solid-appearing left adnexal mass measuring 4.6 x 3.8 cm, previously 3.5 cm. Uterus otherwise unremarkable Other: Negative for pelvic effusion or free air. Musculoskeletal: Multilevel degenerative changes. No acute osseous abnormality. IMPRESSION: 1. No CT evidence for acute intra-abdominal or pelvic abnormality. 2. Diverticular disease of the left colon without acute inflammatory process. 3. 4.6 cm solid-appearing left adnexal mass, slightly increased in size compared with 2014 exam. This was thought to represent either opened undulated fibroid or  solid ovarian mass by prior imaging. Given slight interval growth, follow-up nonemergent MRI could be considered if deemed clinically appropriate given patient age 64. Aortic atherosclerosis. Aortic Atherosclerosis (ICD10-I70.0). Electronically Signed   By: Esmeralda Hedge M.D.   On: 01/13/2024 16:56   CT Head Wo Contrast Result Date: 01/13/2024 CLINICAL DATA:  Multiple falls EXAM: CT HEAD WITHOUT CONTRAST CT CERVICAL SPINE WITHOUT CONTRAST TECHNIQUE: Multidetector CT imaging of the head and cervical spine was performed following the standard protocol without intravenous contrast. Multiplanar CT image reconstructions of the cervical spine were also generated. RADIATION DOSE REDUCTION: This exam was performed according to the departmental dose-optimization program which includes automated exposure control, adjustment of the mA and/or kV according to patient size and/or use of iterative reconstruction technique. COMPARISON:  None Available. FINDINGS: CT HEAD FINDINGS Brain: No evidence of acute infarction, hemorrhage, hydrocephalus, extra-axial collection or mass lesion/mass effect. Periventricular white  matter hypodensity. Nonacute lacunar infarction of the right lentiform nuclei and corona radiata. Vascular: No hyperdense vessel or unexpected calcification. Skull: Normal. Negative for fracture or focal lesion. Sinuses/Orbits: No acute finding. Other: None. CT CERVICAL SPINE FINDINGS Alignment: Degenerative straightening of the normal cervical lordosis. Skull base and vertebrae: No acute fracture. No primary bone lesion or focal pathologic process. Soft tissues and spinal canal: No prevertebral fluid or swelling. No visible canal hematoma. Disc levels: Moderate multilevel cervical disc degenerative disease from C4-C7. Upper chest: Negative. Other: None. IMPRESSION: 1. No acute intracranial pathology. Small-vessel white matter disease and nonacute lacunar infarction of the right lentiform nuclei and corona radiata. 2. No fracture or static subluxation of the cervical spine. 3. Moderate multilevel cervical disc degenerative disease from C4-C7. Electronically Signed   By: Fredricka Jenny M.D.   On: 01/13/2024 15:05   CT Cervical Spine Wo Contrast Result Date: 01/13/2024 CLINICAL DATA:  Multiple falls EXAM: CT HEAD WITHOUT CONTRAST CT CERVICAL SPINE WITHOUT CONTRAST TECHNIQUE: Multidetector CT imaging of the head and cervical spine was performed following the standard protocol without intravenous contrast. Multiplanar CT image reconstructions of the cervical spine were also generated. RADIATION DOSE REDUCTION: This exam was performed according to the departmental dose-optimization program which includes automated exposure control, adjustment of the mA and/or kV according to patient size and/or use of iterative reconstruction technique. COMPARISON:  None Available. FINDINGS: CT HEAD FINDINGS Brain: No evidence of acute infarction, hemorrhage, hydrocephalus, extra-axial collection or mass lesion/mass effect. Periventricular white matter hypodensity. Nonacute lacunar infarction of the right lentiform nuclei and corona  radiata. Vascular: No hyperdense vessel or unexpected calcification. Skull: Normal. Negative for fracture or focal lesion. Sinuses/Orbits: No acute finding. Other: None. CT CERVICAL SPINE FINDINGS Alignment: Degenerative straightening of the normal cervical lordosis. Skull base and vertebrae: No acute fracture. No primary bone lesion or focal pathologic process. Soft tissues and spinal canal: No prevertebral fluid or swelling. No visible canal hematoma. Disc levels: Moderate multilevel cervical disc degenerative disease from C4-C7. Upper chest: Negative. Other: None. IMPRESSION: 1. No acute intracranial pathology. Small-vessel white matter disease and nonacute lacunar infarction of the right lentiform nuclei and corona radiata. 2. No fracture or static subluxation of the cervical spine. 3. Moderate multilevel cervical disc degenerative disease from C4-C7. Electronically Signed   By: Fredricka Jenny M.D.   On: 01/13/2024 15:05   DG Chest Port 1 View Result Date: 01/13/2024 CLINICAL DATA:  Weakness. EXAM: PORTABLE CHEST 1 VIEW COMPARISON:  07/28/2022 FINDINGS: The cardio pericardial silhouette is enlarged. Right lung clear. There is some collapse/consolidative opacity at the left  base and probable tiny left pleural effusion. No acute bony abnormality. IMPRESSION: Collapse/consolidative opacity at the left base with probable tiny left pleural effusion. Electronically Signed   By: Donnal Fusi M.D.   On: 01/13/2024 12:57    EKG:  Vent. rate 83 BPM PR interval 178 ms QRS duration 91 ms QT/QTcB 364/428 ms P-R-T axes 45 26 34 Sinus rhythm  Assessment and plan    Principal Problem:   Pneumonia Active Problems:   Hyperlipemia   Essential hypertension   H/O falling   Rhabdomyolysis   Lobar pneumonia (HCC)  Pneumonia - Reports cough, congestion, has leukocytosis, chest x-ray significant for left lung base opacity - Pneumonia pathway, check sputum culture, Legionella antigen and strep pneumonia  antigen - With significant atelectasis, she was encouraged to use incentive spirometer and flutter valve - Continue with incentive spirometry and flutter valve.  Fall Generalized weakness - Will consult PT/OT  Rhabdomyolysis  -she has been on the floor for prolonged period, total CK is elevated, avoid nephrotoxic medications, hold statin and continue with IV fluids  Hypertension - Initial blood pressure on the lower side, but responded to fluid, for now we will just keep on Coreg , hold amlodipine  and Benicar , will keep on as needed hydralazine  Hyperlipidemia - Hold statin and Zetia  for now, resume once stable and total CK normalized  Elevated troponins -she denies any chest pain, shortness of breath. non-ACS pattern, this is most likely in the setting of rhabdomyolysis - EKG nonacute    DVT Prophylaxis Heparin   AM Labs Ordered, also please review Full Orders  Family Communication: Admission, patients condition and plan of care including tests being ordered have been discussed with the patient who indicate understanding and agree with the plan and Code Status.  Code Status Full  Likely DC to  home vs SNF  Condition GUARDED    Consults called: none    Admission status: inpatient    Time spent in minutes : 70 minutes   Seena Dadds M.D on 01/13/2024 at 5:53 PM   [facility] - Office  [phone]

## 2024-01-13 NOTE — Progress Notes (Signed)
   01/13/24 2040  TOC Brief Assessment  Insurance and Status Reviewed  Patient has primary care physician Yes  Home environment has been reviewed From home  Prior level of function: Independent  Prior/Current Home Services No current home services  Social Drivers of Health Review SDOH reviewed no interventions necessary  Readmission risk has been reviewed Yes  Transition of care needs no transition of care needs at this time   Transition of Care Department [facility]) has reviewed patient and no other TOC needs have been identified at this time. We will continue to monitor patient advancement through interdisciplinary progression rounds. If new patient needs arise, please place a TOC consult.

## 2024-01-13 NOTE — Telephone Encounter (Signed)
  Chief Complaint: 2 falls today. Pt was on the floor for an unknown amount of time after first fall. Symptoms: weakness, skinned knees. Head up against bathtub, neck was injured earlier this week Frequency: today  - this morning Pertinent Negatives: Patient denies  Disposition: [x] ED /[] Urgent Care (no appt availability in office) / [] Appointment(In office/virtual)/ []  [facility]/ [] Home Care/ [] Refused Recommended Disposition /[] [facility]/ []  Follow-up with PCP Additional Notes: Call from pt's niece. Pt has fallen 2 times this morning. First fall happened at unknown time, unknown how long pt was on the bathroom floor. 2nd fall occurred while getting pt up from the floor. Pt is weak. Knees skinned. Pt injured her beck earlier this week from another fall. Per niece, pt is alert and oriented. Niece will take pt to ED for evaluation.    Copied from CRM [id number]. Topic: Clinical - Red Word Triage >> Jan 13, 2024  8:27 AM El Gravely T wrote: Red Word that prompted transfer to Nurse Triage: Patient fell twice this morning, hitting her head, now unable to walk. Reason for Disposition  [1] Unable to get up until help (e.g., caregiver, family, friend) arrived AND [2] on the ground 1 hour or more  Answer Assessment - Initial Assessment Questions 1. MECHANISM: "How did the fall happen?"     Got up to bathroom 2. DOMESTIC VIOLENCE AND ELDER ABUSE SCREENING: "Did you fall because someone pushed you or tried to hurt you?" If Yes, ask: "Are you safe now?"     no 3. ONSET: "When did the fall happen?" (e.g., minutes, hours, or days ago)     This morning 4. LOCATION: "What part of the body hit the ground?" (e.g., back, buttocks, head, hips, knees, hands, head, stomach)     Head on bathtub - skinned knees 5. INJURY: "Did you hurt (injure) yourself when you fell?" If Yes, ask: "What did you injure? Tell me more about this?" (e.g., body area; type of injury; pain severity)"     Head was up  against bathtub, neck  at weird angle. Knees are skinned 10. CAUSE: "What do you think caused the fall (or falling)?" (e.g., tripped, dizzy spell)       Weakness  Protocols used: Falls and [facility]

## 2024-01-13 NOTE — Progress Notes (Signed)
 ED TO INPATIENT HANDOFF REPORT  ED Nurse Name and Phone #: Gwendloyn Lemming [phone]  S Name/Age/Gender Kristen Cox 83 y.o. female Room/Bed: APA10/APA10  Code Status   Code Status: Full Code  Home/SNF/Other Home Patient oriented to: self, place, and time Is this baseline? Yes   Triage Complete: Triage complete  Chief Complaint Pneumonia [J18.9]  Triage Note Pt brought in by family. Pt and family reports that she had fallen twice this morning.  Pt states that she tripped and fell onto her stomach and was unable to get up on her own. One unwitnessed fall, unknown down time, second fall was witnessed by son.  Denies head strike, denies blood thinners. Pt denies pain anywhere. Pt states that she has been feeling weak over the last few days in her legs but today was worse.   Pt is alert oriented in triage.    Allergies Allergies  Allergen Reactions   Amlodipine  Other (See Comments)    Excess cramping and stiffness of hands and legs   Benazepril Other (See Comments)    Patient does not recall being on this medication and/or what reaction she might have had.   Metoprolol  Other (See Comments)    CRAMPS CRAMPS CRAMPS    Level of Care/Admitting Diagnosis ED Disposition     ED Disposition  Admit   Condition  --   Comment  Hospital Area: [facility] [[id number]]  Level of Care: Med-Surg [16]  Covid Evaluation: Asymptomatic - no recent exposure (last 10 days) testing not required  Diagnosis: Pneumonia [132440]  Admitting Physician: Shyrl Doyne  Attending Physician: Osborne Blazer, DAWOOD S [[id number]]  Certification:: I certify this patient will need inpatient services for at least 2 midnights  Expected Medical Readiness: 01/15/2024          B Medical/Surgery History Past Medical History:  Diagnosis Date   Deep vein thrombosis (DVT) (HCC) 08/2016   Right leg   Dry eyes 2010   on restasis - Dr. Barbra Boone    Gout 2009   Hyperlipidemia    Hypertension     Spinal stenosis    Past Surgical History:  Procedure Laterality Date   CESAREAN SECTION     COLONOSCOPY  04/09/2009   NUU:VOZD papilla otherwise, normal rectum/Left-sided diverticula/Cecal polyp status post cold snare and removal/The remainder of the colonic mucosa appeared normal. Adenomatous polyps, next TCS 03/2016 per RMR.   COLONOSCOPY N/A 05/19/2016   Procedure: COLONOSCOPY;  Surgeon: Suzette Espy, MD;  Location: [facility];  Service: Endoscopy;  Laterality: N/A;  1:00 PM     A IV Location/Drains/Wounds Patient Lines/Drains/Airways Status     Active Line/Drains/Airways     Name Placement date Placement time Site Days   Peripheral IV 01/13/24 20 G Anterior;Left;Proximal Forearm 01/13/24  1044  Forearm  less than 1            Intake/Output Last 24 hours  Intake/Output Summary (Last 24 hours) at 01/13/2024 1756 Last data filed at 01/13/2024 1341 Gross per 24 hour  Intake 500 ml  Output --  Net 500 ml    Labs/Imaging Results for orders placed or performed during the hospital encounter of 01/13/24 (from the past 48 hours)  Troponin I (High Sensitivity)     Status: Abnormal   Collection Time: 01/13/24 10:41 AM  Result Value Ref Range   Troponin I (High Sensitivity) 54 (H) <18 ng/L    Comment: (NOTE) Elevated high sensitivity troponin I (hsTnI) values and significant  changes across  serial measurements may suggest ACS but many other  chronic and acute conditions are known to elevate hsTnI results.  Refer to the "Links" section for chest pain algorithms and additional  guidance. Performed at [facility], [address] [place], [place] [address]   CBC with Differential     Status: Abnormal   Collection Time: 01/13/24 10:41 AM  Result Value Ref Range   WBC 13.6 (H) 4.0 - 10.5 K/uL   RBC 2.84 (L) 3.87 - 5.11 MIL/uL   Hemoglobin 10.6 (L) 12.0 - 15.0 g/dL   HCT 19.1 (L) 47.8 - 29.5 %   MCV 109.5 (H) 80.0 - 100.0 fL   MCH 37.3 (H) 26.0 - 34.0 pg   MCHC 34.1  30.0 - 36.0 g/dL   RDW 62.1 30.8 - 65.7 %   Platelets 481 (H) 150 - 400 K/uL   nRBC 0.2 0.0 - 0.2 %   Neutrophils Relative % 70 %   Neutro Abs 9.6 (H) 1.7 - 7.7 K/uL   Lymphocytes Relative 17 %   Lymphs Abs 2.3 0.7 - 4.0 K/uL   Monocytes Relative 13 %   Monocytes Absolute 1.7 (H) 0.1 - 1.0 K/uL   Eosinophils Relative 0 %   Eosinophils Absolute 0.0 0.0 - 0.5 K/uL   Basophils Relative 0 %   Basophils Absolute 0.0 0.0 - 0.1 K/uL   Immature Granulocytes 0 %   Abs Immature Granulocytes 0.05 0.00 - 0.07 K/uL    Comment: Performed at [facility], [address], [place], [place] [address]  Vitamin B12     Status: Abnormal   Collection Time: 01/13/24 10:41 AM  Result Value Ref Range   Vitamin B-12 51 (L) 180 - 914 pg/mL    Comment: (NOTE) This assay is not validated for testing neonatal or myeloproliferative syndrome specimens for Vitamin B12 levels. Performed at [facility], [address], [place], [place] [address]   Folate     Status: None   Collection Time: 01/13/24 10:41 AM  Result Value Ref Range   Folate 19.3 >5.9 ng/mL    Comment: Performed at [facility], [address], [place], [place] [address]  Iron and TIBC     Status: None   Collection Time: 01/13/24 10:41 AM  Result Value Ref Range   Iron 61 28 - 170 ug/dL   TIBC 401 027 - 253 ug/dL   Saturation Ratios 24 10.4 - 31.8 %   UIBC 189 ug/dL    Comment: Performed at [facility], [address], [place], [place] [address]  Ferritin     Status: None   Collection Time: 01/13/24 10:41 AM  Result Value Ref Range   Ferritin 163 11 - 307 ng/mL    Comment: Performed at [facility], [address] [place], [place] [address]  Reticulocytes     Status: Abnormal   Collection Time: 01/13/24 10:41 AM  Result Value Ref Range   Retic Ct Pct 3.4 (H) 0.4 - 3.1 %   RBC. 2.82 (L) 3.87 - 5.11 MIL/uL   Retic Count, Absolute 94.8 19.0 - 186.0 K/uL   Immature Retic Fract 17.3 (H) 2.3 - 15.9 %    Comment: Performed at [facility], [address], [place], [place] [address]  CK     Status: Abnormal   Collection Time: 01/13/24 11:32 AM  Result Value Ref Range   Total CK 2,007 (H) 38 - 234 U/L    Comment: Performed at [facility], [address] [place], [place]  [address]  Comprehensive metabolic panel with GFR     Status: Abnormal   Collection Time: 01/13/24 11:32 AM  Result Value Ref Range   Sodium 135 135 - 145 mmol/L   Potassium 3.5 3.5 - 5.1 mmol/L   Chloride 104 98 - 111 mmol/L   CO2 24 22 - 32 mmol/L   Glucose, Bld 106 (H) 70 - 99 mg/dL    Comment: Glucose reference range applies only to samples taken after fasting for at least 8 hours.   BUN 17 8 - 23 mg/dL   Creatinine, Ser 2.13 0.44 - 1.00 mg/dL   Calcium  9.7 8.9 - 10.3 mg/dL   Total Protein 7.1 6.5 - 8.1 g/dL   Albumin 3.4 (L) 3.5 - 5.0 g/dL   AST 74 (H) 15 - 41 U/L   ALT 61 (H) 0 - 44 U/L   Alkaline Phosphatase 71 38 - 126 U/L   Total Bilirubin 0.6 0.0 - 1.2 mg/dL   GFR, Estimated >08 >65 mL/min    Comment: (NOTE) Calculated using the CKD-EPI Creatinine Equation (2021)    Anion gap 7 5 - 15    Comment: Performed at [facility], [address] [place], [place] [address]  Urinalysis, Routine w reflex microscopic -Urine, Clean Catch     Status: Abnormal   Collection Time: 01/13/24 11:39 AM  Result Value Ref Range   Color, Urine STRAW (A) YELLOW   APPearance CLEAR CLEAR   Specific Gravity, Urine 1.008 1.005 - 1.030   pH 6.0 5.0 - 8.0   Glucose, UA NEGATIVE NEGATIVE mg/dL   Hgb urine dipstick NEGATIVE NEGATIVE   Bilirubin Urine NEGATIVE NEGATIVE   Ketones, ur NEGATIVE NEGATIVE mg/dL   Protein, ur NEGATIVE NEGATIVE mg/dL   Nitrite NEGATIVE NEGATIVE   Leukocytes,Ua TRACE (A) NEGATIVE   RBC / HPF 0-5 0 - 5 RBC/hpf   WBC, UA 0-5 0 - 5 WBC/hpf   Bacteria, UA NONE SEEN NONE SEEN   Squamous Epithelial / HPF 0-5 0 - 5 /HPF    Comment: Performed at [facility], [address], [place], [place] [address]  Troponin I (High Sensitivity)     Status:  Abnormal   Collection Time: 01/13/24 12:26 PM  Result Value Ref Range   Troponin I (High Sensitivity) 48 (H) <18 ng/L    Comment: (NOTE) Elevated high sensitivity troponin I (hsTnI) values and significant  changes across serial measurements may suggest ACS but many other  chronic and acute conditions are known to elevate hsTnI results.  Refer to the "Links" section for chest pain algorithms and additional  guidance. Performed at [facility], [address], [place], [place] [address]    CT ABDOMEN PELVIS W CONTRAST Result Date: 01/13/2024 CLINICAL DATA:  Fall with abdominal pain EXAM: CT ABDOMEN AND PELVIS WITH CONTRAST TECHNIQUE: Multidetector CT imaging of the abdomen and pelvis was performed using the standard protocol following bolus administration of intravenous contrast. RADIATION DOSE REDUCTION: This exam was performed according to the departmental dose-optimization program which includes automated exposure control, adjustment of the mA and/or kV according to patient size and/or use of iterative reconstruction technique. CONTRAST:  OMNIPAQUE  IOHEXOL  300 MG/ML  SOLN COMPARISON:  CT 05/21/2013, pelvic ultrasound 05/23/2013 FINDINGS: Lower chest: Lung bases demonstrate no acute airspace disease. Coronary vascular calcification. Partially visualized cystic and solid mass in the anterior mediastinum, measures 2.7 by 2.2 cm, previously 2.7 cm, and stable dating back to 08/26/2022 chest CT. Hepatobiliary: Multiple hepatic cysts. Dystrophic calcification within the cyst  at the right posterior hepatic dome. No calcified gallstones. No biliary dilatation Pancreas: Unremarkable. No pancreatic ductal dilatation or surrounding inflammatory changes. Spleen: Splenic granuloma. Interval splenic atrophy compared with 2014 exam. Adrenals/Urinary Tract: Adrenal glands are normal. Kidneys show no hydronephrosis. Urinary bladder is distended Stomach/Bowel: Stomach nonenlarged. There is no dilated small bowel.  Diverticular disease of the left colon. Negative appendix. No acute bowel wall thickening Vascular/Lymphatic: Aortic atherosclerosis. No enlarged abdominal or pelvic lymph nodes. Reproductive: Solid-appearing left adnexal mass measuring 4.6 x 3.8 cm, previously 3.5 cm. Uterus otherwise unremarkable Other: Negative for pelvic effusion or free air. Musculoskeletal: Multilevel degenerative changes. No acute osseous abnormality. IMPRESSION: 1. No CT evidence for acute intra-abdominal or pelvic abnormality. 2. Diverticular disease of the left colon without acute inflammatory process. 3. 4.6 cm solid-appearing left adnexal mass, slightly increased in size compared with 2014 exam. This was thought to represent either opened undulated fibroid or solid ovarian mass by prior imaging. Given slight interval growth, follow-up nonemergent MRI could be considered if deemed clinically appropriate given patient age 73. Aortic atherosclerosis. Aortic Atherosclerosis (ICD10-I70.0). Electronically Signed   By: Esmeralda Hedge M.D.   On: 01/13/2024 16:56   CT Head Wo Contrast Result Date: 01/13/2024 CLINICAL DATA:  Multiple falls EXAM: CT HEAD WITHOUT CONTRAST CT CERVICAL SPINE WITHOUT CONTRAST TECHNIQUE: Multidetector CT imaging of the head and cervical spine was performed following the standard protocol without intravenous contrast. Multiplanar CT image reconstructions of the cervical spine were also generated. RADIATION DOSE REDUCTION: This exam was performed according to the departmental dose-optimization program which includes automated exposure control, adjustment of the mA and/or kV according to patient size and/or use of iterative reconstruction technique. COMPARISON:  None Available. FINDINGS: CT HEAD FINDINGS Brain: No evidence of acute infarction, hemorrhage, hydrocephalus, extra-axial collection or mass lesion/mass effect. Periventricular white matter hypodensity. Nonacute lacunar infarction of the right lentiform nuclei and  corona radiata. Vascular: No hyperdense vessel or unexpected calcification. Skull: Normal. Negative for fracture or focal lesion. Sinuses/Orbits: No acute finding. Other: None. CT CERVICAL SPINE FINDINGS Alignment: Degenerative straightening of the normal cervical lordosis. Skull base and vertebrae: No acute fracture. No primary bone lesion or focal pathologic process. Soft tissues and spinal canal: No prevertebral fluid or swelling. No visible canal hematoma. Disc levels: Moderate multilevel cervical disc degenerative disease from C4-C7. Upper chest: Negative. Other: None. IMPRESSION: 1. No acute intracranial pathology. Small-vessel white matter disease and nonacute lacunar infarction of the right lentiform nuclei and corona radiata. 2. No fracture or static subluxation of the cervical spine. 3. Moderate multilevel cervical disc degenerative disease from C4-C7. Electronically Signed   By: Fredricka Jenny M.D.   On: 01/13/2024 15:05   CT Cervical Spine Wo Contrast Result Date: 01/13/2024 CLINICAL DATA:  Multiple falls EXAM: CT HEAD WITHOUT CONTRAST CT CERVICAL SPINE WITHOUT CONTRAST TECHNIQUE: Multidetector CT imaging of the head and cervical spine was performed following the standard protocol without intravenous contrast. Multiplanar CT image reconstructions of the cervical spine were also generated. RADIATION DOSE REDUCTION: This exam was performed according to the departmental dose-optimization program which includes automated exposure control, adjustment of the mA and/or kV according to patient size and/or use of iterative reconstruction technique. COMPARISON:  None Available. FINDINGS: CT HEAD FINDINGS Brain: No evidence of acute infarction, hemorrhage, hydrocephalus, extra-axial collection or mass lesion/mass effect. Periventricular white matter hypodensity. Nonacute lacunar infarction of the right lentiform nuclei and corona radiata. Vascular: No hyperdense vessel or unexpected calcification. Skull: Normal.  Negative for fracture or focal  lesion. Sinuses/Orbits: No acute finding. Other: None. CT CERVICAL SPINE FINDINGS Alignment: Degenerative straightening of the normal cervical lordosis. Skull base and vertebrae: No acute fracture. No primary bone lesion or focal pathologic process. Soft tissues and spinal canal: No prevertebral fluid or swelling. No visible canal hematoma. Disc levels: Moderate multilevel cervical disc degenerative disease from C4-C7. Upper chest: Negative. Other: None. IMPRESSION: 1. No acute intracranial pathology. Small-vessel white matter disease and nonacute lacunar infarction of the right lentiform nuclei and corona radiata. 2. No fracture or static subluxation of the cervical spine. 3. Moderate multilevel cervical disc degenerative disease from C4-C7. Electronically Signed   By: Fredricka Jenny M.D.   On: 01/13/2024 15:05   DG Chest Port 1 View Result Date: 01/13/2024 CLINICAL DATA:  Weakness. EXAM: PORTABLE CHEST 1 VIEW COMPARISON:  07/28/2022 FINDINGS: The cardio pericardial silhouette is enlarged. Right lung clear. There is some collapse/consolidative opacity at the left base and probable tiny left pleural effusion. No acute bony abnormality. IMPRESSION: Collapse/consolidative opacity at the left base with probable tiny left pleural effusion. Electronically Signed   By: Donnal Fusi M.D.   On: 01/13/2024 12:57    Pending Labs Unresulted Labs (From admission, onward)     Start     Ordered   01/14/24 0500  Basic metabolic panel  Tomorrow morning,   R        01/13/24 1754   01/14/24 0500  CBC  Tomorrow morning,   R        01/13/24 1754   01/14/24 0500  CK  Tomorrow morning,   R        01/13/24 1723   01/13/24 1755  Legionella Pneumophila Serogp 1 Ur Ag  (COPD / Pneumonia / Cellulitis / Lower Extremity Wound (Diabetic Foot Infection))  Once,   R        01/13/24 1754   01/13/24 1755  Strep pneumoniae urinary antigen  (COPD / Pneumonia / Cellulitis / Lower Extremity Wound  (Diabetic Foot Infection))  Once,   R        01/13/24 1754   01/13/24 1755  Expectorated Sputum Assessment w Gram Stain, Rflx to Resp Cult  (COPD / Pneumonia / Cellulitis / Lower Extremity Wound (Diabetic Foot Infection))  Once,   R        01/13/24 1754            Vitals/Pain Today's Vitals   01/13/24 1645 01/13/24 1745 01/13/24 1749 01/13/24 1755  BP: (!) 147/105 132/76  114/78  Pulse:    78  Resp: (!) 22 20  19   Temp:    97.9 F (36.6 C)  TempSrc:    Oral  SpO2:    97%  Weight:      Height:      PainSc:   0-No pain     Isolation Precautions No active isolations  Medications Medications  lactated ringers infusion ( Intravenous New Bag/Given 01/13/24 1443)  cefTRIAXone  (ROCEPHIN ) 1 g in sodium chloride  0.9 % 100 mL IVPB (has no administration in time range)  acetaminophen  (TYLENOL ) tablet 650 mg (has no administration in time range)  aspirin EC tablet 81 mg (has no administration in time range)  amLODipine  (NORVASC ) tablet 2.5 mg (has no administration in time range)  carvedilol  (COREG ) tablet 12.5 mg (has no administration in time range)  levocetirizine (XYZAL ) tablet 5 mg (has no administration in time range)  montelukast  (SINGULAIR ) tablet 10 mg (has no administration in time range)  cefTRIAXone  (ROCEPHIN ) 2 g in  sodium chloride  0.9 % 100 mL IVPB (has no administration in time range)  azithromycin  (ZITHROMAX ) 500 mg in sodium chloride  0.9 % 250 mL IVPB (has no administration in time range)  heparin injection 5,000 Units (has no administration in time range)  albuterol (PROVENTIL) (2.5 MG/3ML) 0.083% nebulizer solution 2.5 mg (has no administration in time range)  hydrALAZINE (APRESOLINE) injection 5 mg (has no administration in time range)  lactated ringers bolus 500 mL (0 mLs Intravenous Stopped 01/13/24 1341)  iohexol  (OMNIPAQUE ) 300 MG/ML solution 100 mL (100 mLs Intravenous Contrast Given 01/13/24 1340)  cefTRIAXone  (ROCEPHIN ) 1 g in sodium chloride  0.9 % 100 mL IVPB  (1 g Intravenous New Bag/Given 01/13/24 1526)  azithromycin  (ZITHROMAX ) 500 mg in sodium chloride  0.9 % 250 mL IVPB (0 mg Intravenous Stopped 01/13/24 1741)    Mobility walks with device / Cane. Lives with son. Fell at home.     Focused Assessments           R Recommendations: See Admitting Provider Note  Report given to:   Additional Notes: Weakness and unknown how long she was down for,

## 2024-01-13 NOTE — ED Provider Notes (Signed)
 [place] EMERGENCY DEPARTMENT AT [facility] Provider Note   CSN: [id number] Arrival date & time: 01/13/24  4098     History  Chief Complaint  Patient presents with   Fall   Weakness    SHALANDRA LEU is a 83 y.o. female.  Patient is an 83 year old female who presents emergency department secondary to progressive worsening weakness over the past week.  Patient notes that she is normally ambulatory without difficulty but notes that she has had increased weakness in her legs and has had 2 falls this morning.  Patient notes that the initial fall she did fall directly onto her abdomen.  She denies any associated chest pain or shortness of breath.  She has had no dizziness, lightheadedness or syncopal events.  She denies any associated numbness, paresthesias or unilateral weakness.  She denies striking her head during the fall this morning but does note that she is having some pain in her neck.  She denies any associated nausea, vomiting, diarrhea.  She denies any changes in urination to include dysuria or hematuria.   Fall  Weakness      Home Medications Prior to Admission medications   Medication Sig Start Date End Date Taking? Authorizing Provider  amLODipine  (NORVASC ) 2.5 MG tablet Take 1 tablet (2.5 mg total) by mouth daily. 09/20/23   Towanda Fret, MD  amLODipine  (NORVASC ) 5 MG tablet TAKE 1 TABLET(5 MG) BY MOUTH DAILY 11/16/23   Towanda Fret, MD  aspirin EC 81 MG tablet Take 81 mg by mouth daily.    [provider]  atorvastatin  (LIPITOR) 40 MG tablet TAKE 1 TABLET(40 MG) BY MOUTH DAILY 04/11/23   Towanda Fret, MD  carvedilol  (COREG ) 12.5 MG tablet TAKE 1 TABLET(12.5 MG) BY MOUTH TWICE DAILY WITH A MEAL 01/05/24   Towanda Fret, MD  cholecalciferol  (VITAMIN D3) 25 MCG (1000 UNIT) tablet Take 1,000 Units by mouth daily.    [provider]  cyclobenzaprine  (FLEXERIL ) 5 MG tablet Take one tablet by mouth twice daily for 5 days,  then one tablet by mouth at bedtime as needed for neck spasm 01/10/24   Towanda Fret, MD  ezetimibe  (ZETIA ) 10 MG tablet Take 1 tablet (10 mg total) by mouth daily. 06/29/23 12/23/23  Elmyra Haggard, MD  Flaxseed, Linseed, (FLAXSEED OIL) 1200 MG CAPS Take by mouth.    [provider]  levocetirizine (XYZAL ) 5 MG tablet Take one tablet by mouth two times daily 01/10/24   Towanda Fret, MD  montelukast  (SINGULAIR ) 10 MG tablet Take 1 tablet (10 mg total) by mouth at bedtime. 01/10/24   Towanda Fret, MD  olmesartan  (BENICAR ) 20 MG tablet TAKE 1 TABLET(20 MG) BY MOUTH DAILY 11/21/23   Towanda Fret, MD  predniSONE  (DELTASONE ) 5 MG tablet Take 1 tablet (5 mg total) by mouth 2 (two) times daily for 5 days. 01/10/24 01/15/24  Towanda Fret, MD      Allergies    Amlodipine , Benazepril, and Metoprolol     Review of Systems   Review of Systems  Neurological:  Positive for weakness.    Physical Exam Updated Vital Signs BP 136/70 (BP Location: Right Arm)   Pulse 97   Temp 97.8 F (36.6 C) (Oral)   Resp 17   Ht 5\' 2"  (1.575 m)   Wt 64 kg   SpO2 98%   BMI 25.80 kg/m  Physical Exam Vitals and nursing note reviewed.  Constitutional:  Appearance: Normal appearance.  HENT:     Head: Normocephalic and atraumatic.     Nose: Nose normal.     Mouth/Throat:     Mouth: Mucous membranes are moist.  Eyes:     Extraocular Movements: Extraocular movements intact.     Conjunctiva/sclera: Conjunctivae normal.     Pupils: Pupils are equal, round, and reactive to light.  Cardiovascular:     Rate and Rhythm: Normal rate and regular rhythm.     Pulses: Normal pulses.     Heart sounds: Normal heart sounds. No murmur heard.    No gallop.  Pulmonary:     Effort: Pulmonary effort is normal. No respiratory distress.     Breath sounds: Normal breath sounds. No stridor. No wheezing, rhonchi or rales.  Abdominal:     General: Abdomen is flat. Bowel sounds are normal. There  is no distension.     Palpations: Abdomen is soft.     Tenderness: There is no abdominal tenderness. There is no guarding.  Musculoskeletal:        General: No swelling, tenderness, deformity or signs of injury. Normal range of motion.     Cervical back: Normal range of motion and neck supple. No rigidity.     Right lower leg: Edema present.     Left lower leg: Edema present.     Comments: Nontender palpation of the thoracic and lumbar spine, no step-off or deformity  Skin:    General: Skin is warm and dry.     Findings: No bruising or rash.  Neurological:     General: No focal deficit present.     Mental Status: She is alert and oriented to person, place, and time. Mental status is at baseline.     Cranial Nerves: No cranial nerve deficit.     Sensory: No sensory deficit.     Coordination: Coordination normal.     Gait: Gait normal.  Psychiatric:        Mood and Affect: Mood normal.        Behavior: Behavior normal.        Thought Content: Thought content normal.        Judgment: Judgment normal.     ED Results / Procedures / Treatments   Labs (all labs ordered are listed, but only abnormal results are displayed) Labs Reviewed  COMPREHENSIVE METABOLIC PANEL WITH GFR  CBC WITH DIFFERENTIAL/PLATELET  URINALYSIS, ROUTINE W REFLEX MICROSCOPIC  VITAMIN B12  FOLATE  IRON AND TIBC  FERRITIN  RETICULOCYTES  TROPONIN I (HIGH SENSITIVITY)    EKG None  Radiology No results found.  Procedures Procedures    Medications Ordered in ED Medications - No data to display  ED Course/ Medical Decision Making/ A&P                                 Medical Decision Making Amount and/or Complexity of Data Reviewed Labs: ordered. Radiology: ordered.  Risk Prescription drug management. Decision regarding hospitalization.   This patient presents to the ED for concern of generalized weakness, fall, this involves an extensive number of treatment options, and is a complaint that  carries with it a high risk of complications and morbidity.  The differential diagnosis includes acute kidney injury, rhabdomyolysis, intra-abdominal hemorrhage, intracranial hemorrhage, CVA, TIA, pneumonia   Co morbidities that complicate the patient evaluation  Chronic ambulatory dysfunction   Additional history obtained:  Additional history obtained from family  External records from outside source obtained and reviewed including records   Lab Tests:  I Ordered, and personally interpreted labs.  The pertinent results include: Leukocytosis, anemia, normal kidney function, elevated AST and ALT, normal electrolytes, elevated CK, elevated troponins but flat, unremarkable urinalysis   Imaging Studies ordered:  I ordered imaging studies including CT head, CT cervical spine, CT abdomen and pelvis, chest x-ray I independently visualized and interpreted imaging which showed no acute intracranial hemorrhage, no cervical spine fracture, left ovarian cyst, left lobar consolidation I agree with the radiologist interpretation   Cardiac Monitoring: / EKG:  The patient was maintained on a cardiac monitor.  I personally viewed and interpreted the cardiac monitored which showed an underlying rhythm of: Normal sinus rhythm, no ST/T wave changes, no ischemic changes, no STEMI   Consultations Obtained:  I requested consultation with the hospitalist,  and discussed lab and imaging findings as well as pertinent plan - they recommend: Admission   Problem List / ED Course / Critical interventions / Medication management  Patient is doing well at this time and does remain stable.  Discussed with patient and family that she does appear to be in rhabdomyolysis at this point.  Did provide patient with antibiotics given the consolidation noted on chest x-ray and her elevated white blood cell count.  Patient does have stable vital signs at this point and does not meet sepsis criteria.  CT scan of abdomen  and pelvis demonstrated a left ovarian cyst and patient was made aware of this and need for follow-up for further imaging.  Troponins were elevated but flat.  Patient has been started on slow IV fluids at this point to avoid fluid overload.  Have discussed patient case with Dr. Osborne Blazer with the hospitalist service who has excepted for admission. I ordered medication including Rocephin , azithromycin , IV fluids for possible pneumonia, right of a lysis Reevaluation of the patient after these medicines showed that the patient improved I have reviewed the patients home medicines and have made adjustments as needed   Social Determinants of Health:  None   Test / Admission - Considered:  Admission        Final Clinical Impression(s) / ED Diagnoses Final diagnoses:  None    Rx / DC Orders ED Discharge Orders     None         Tierre Gerard D, PA-C 01/13/24 1728    Guadalupe Lee, MD 01/19/24 1035

## 2024-01-14 DIAGNOSIS — J189 Pneumonia, unspecified organism: Secondary | ICD-10-CM | POA: Diagnosis not present

## 2024-01-14 LAB — BASIC METABOLIC PANEL WITH GFR
Anion gap: 3 — ABNORMAL LOW (ref 5–15)
BUN: 18 mg/dL (ref 8–23)
CO2: 26 mmol/L (ref 22–32)
Calcium: 8.9 mg/dL (ref 8.9–10.3)
Chloride: 109 mmol/L (ref 98–111)
Creatinine, Ser: 0.56 mg/dL (ref 0.44–1.00)
GFR, Estimated: >60 mL/min (ref >60)
Glucose, Bld: 98 mg/dL (ref 70–99)
Potassium: 3.3 mmol/L — ABNORMAL LOW (ref 3.5–5.1)
Sodium: 138 mmol/L (ref 135–145)

## 2024-01-14 LAB — STREP PNEUMONIAE URINARY ANTIGEN: Strep Pneumo Urinary Antigen: NEGATIVE

## 2024-01-14 LAB — CBC
HCT: 28.3 % — ABNORMAL LOW (ref 36.0–46.0)
Hemoglobin: 9.4 g/dL — ABNORMAL LOW (ref 12.0–15.0)
MCH: 36.7 pg — ABNORMAL HIGH (ref 26.0–34.0)
MCHC: 33.2 g/dL (ref 30.0–36.0)
MCV: 110.5 fL — ABNORMAL HIGH (ref 80.0–100.0)
Platelets: 429 10*3/uL — ABNORMAL HIGH (ref 150–400)
RBC: 2.56 MIL/uL — ABNORMAL LOW (ref 3.87–5.11)
RDW: 15.1 % (ref 11.5–15.5)
WBC: 11.6 10*3/uL — ABNORMAL HIGH (ref 4.0–10.5)
nRBC: 0.3 % — ABNORMAL HIGH (ref 0.0–0.2)

## 2024-01-14 LAB — RESP PANEL BY RT-PCR (RSV, FLU A&B, COVID)  RVPGX2
Influenza A by PCR: NEGATIVE
Influenza B by PCR: NEGATIVE
Resp Syncytial Virus by PCR: NEGATIVE
SARS Coronavirus 2 by RT PCR: NEGATIVE

## 2024-01-14 LAB — MAGNESIUM: Magnesium: 1.6 mg/dL — ABNORMAL LOW (ref 1.7–2.4)

## 2024-01-14 LAB — CK: Total CK: 1503 U/L — ABNORMAL HIGH (ref 38–234)

## 2024-01-14 MED ORDER — MAGNESIUM SULFATE 2 GM/50ML IV SOLN
2.0000 g | Freq: Once | INTRAVENOUS | Status: AC
  Administered 2024-01-14: 2 g via INTRAVENOUS
  Filled 2024-01-14: qty 50

## 2024-01-14 MED ORDER — CYANOCOBALAMIN 1000 MCG/ML IJ SOLN
1000.0000 ug | Freq: Once | INTRAMUSCULAR | Status: AC
  Administered 2024-01-14: 1000 ug via SUBCUTANEOUS
  Filled 2024-01-14: qty 1

## 2024-01-14 MED ORDER — POTASSIUM CHLORIDE CRYS ER 20 MEQ PO TBCR
40.0000 meq | EXTENDED_RELEASE_TABLET | Freq: Once | ORAL | Status: AC
  Administered 2024-01-14: 40 meq via ORAL
  Filled 2024-01-14: qty 2

## 2024-01-14 MED ORDER — VITAMIN B-12 1000 MCG PO TABS
1000.0000 ug | ORAL_TABLET | Freq: Every day | ORAL | Status: DC
  Administered 2024-01-15 – 2024-01-19 (×5): 1000 ug via ORAL
  Filled 2024-01-14 (×5): qty 1

## 2024-01-14 MED ORDER — LACTATED RINGERS IV SOLN: INTRAVENOUS | Status: AC

## 2024-01-14 MED ORDER — ENOXAPARIN SODIUM 40 MG/0.4ML IJ SOSY
40.0000 mg | PREFILLED_SYRINGE | INTRAMUSCULAR | Status: DC
  Administered 2024-01-15 – 2024-01-19 (×5): 40 mg via SUBCUTANEOUS
  Filled 2024-01-14 (×5): qty 0.4

## 2024-01-14 NOTE — Plan of Care (Signed)

## 2024-01-14 NOTE — Progress Notes (Signed)
 PROGRESS NOTE  Kristen Cox ZOX:[id number] DOB: [birth year] DOA: 01/13/2024 PCP: Towanda Fret, MD  HPI/Recap of past 24 hours: Kristen Cox is a 83 y.o. female, PMH of HTN, HLD presented to ED due to likely mechanical fall, with progressive weakness over the last week. Denies any LOC, head trauma.  In the ED, vital signs fairly stable.  Labs with mildly elevated AST, ALT, elevated CK, elevated troponin 54-->48, leukocytosis, B12 deficiency. Chest x-ray significant for left lung base pneumonia with atelectasis. EKG is non-acute.  Patient admitted for further management.    Today, patient denied any new complaints, discussed extensively with niece at bedside.     Assessment/Plan: Principal Problem:   Pneumonia Active Problems:   Hyperlipemia   Essential hypertension   H/O falling   Rhabdomyolysis   Lobar pneumonia (HCC)   Likely CAP Afebrile with resolving leukocytosis BC x 2 not collected Urine strep pneumo negative, Legionella pending CXR significant for left lung base opacity Continue ceftriaxone , azithromycin  Incentive spirometer and flutter valve   Fall Generalized weakness Likely 2/2 above PT/OT Fall precautions   Rhabdomyolysis  CK 2007-->1503 Likely 2/2 above Continue IV fluids  Mild elevated LFTs AST 74, ALT 61, likely 2/2 above Daily CMP  Mild elevated troponin Flat trend, denies any chest pain, shortness of breath EKG with no acute ST changes Likely 2/2 rhabdo  Hypokalemia/hypomagnesemia Replace as needed  Macrocytic anemia Vitamin B12 deficiency B12--> 51 Supplement B12  Hypertension BP stable Continue amlodipine , Coreg    Hyperlipidemia Hold statin and Zetia  for now, resume once stable and total CK normalized  Obesity type I Lifestyle modification advised       Estimated body mass index is 30.97 kg/m as calculated from the following:   Height as of this encounter: 5\' 2"  (1.575 m).   Weight as of this encounter: 76.8 kg.      Code Status: Full  Family Communication: Niece at bedside  Disposition Plan: Status is: Inpatient Remains inpatient appropriate because: Level of care      Consultants: None  Procedures: None  Antimicrobials: Ceftriaxone  Azithromycin   DVT prophylaxis: Lovenox    Objective: Vitals:   01/13/24 1930 01/14/24 0007 01/14/24 0420 01/14/24 0822  BP:  139/71 122/61 130/68  Pulse:  77 74 72  Resp:  16 18 16   Temp:  98.4 F (36.9 C) 98.2 F (36.8 C) 98.1 F (36.7 C)  TempSrc:  Oral Oral Oral  SpO2:  95% 98% 98%  Weight: 76.8 kg     Height: 5\' 2"  (1.575 m)       Intake/Output Summary (Last 24 hours) at 01/14/2024 1450 Last data filed at 01/14/2024 0600 Gross per 24 hour  Intake 1246.25 ml  Output 500 ml  Net 746.25 ml   Filed Weights   01/13/24 0926 01/13/24 1930  Weight: 64 kg 76.8 kg    Exam: General: NAD  Cardiovascular: S1, S2 present Respiratory: CTAB Abdomen: Soft, nontender, nondistended, bowel sounds present Musculoskeletal: No bilateral pedal edema noted Skin: Normal Psychiatry: Normal mood     Data Reviewed: CBC: Recent Labs  Lab 01/10/24 0948 01/13/24 1041 01/14/24 0414  WBC 8.9 13.6* 11.6*  NEUTROABS 5.2 9.6*  --   HGB 10.7* 10.6* 9.4*  HCT 32.3* 31.1* 28.3*  MCV 112* 109.5* 110.5*  PLT 608* 481* 429*   Basic Metabolic Panel: Recent Labs  Lab 01/10/24 0948 01/13/24 1132 01/14/24 0414 01/14/24 0720  NA 139 135 138  --   K 5.0 3.5 3.3*  --  CL 103 104 109  --   CO2 22 24 26   --   GLUCOSE 91 106* 98  --   BUN 11 17 18   --   CREATININE 0.71 0.66 0.56  --   CALCIUM  10.3 9.7 8.9  --   MG  --   --   --  1.6*   GFR: Estimated Creatinine Clearance: 52 mL/min (by C-G formula based on SCr of 0.56 mg/dL). Liver Function Tests: Recent Labs  Lab 01/10/24 0948 01/13/24 1132  AST 22 74*  ALT 16 61*  ALKPHOS 100 71  BILITOT 0.4 0.6  PROT 6.8 7.1  ALBUMIN 3.9 3.4*   No results for input(s): "LIPASE", "AMYLASE" in the  last 168 hours. No results for input(s): "AMMONIA" in the last 168 hours. Coagulation Profile: No results for input(s): "INR", "PROTIME" in the last 168 hours. Cardiac Enzymes: Recent Labs  Lab 01/13/24 1132 01/14/24 0414  CKTOTAL 2,007* 1,503*   BNP (last 3 results) No results for input(s): "PROBNP" in the last 8760 hours. HbA1C: No results for input(s): "HGBA1C" in the last 72 hours. CBG: No results for input(s): "GLUCAP" in the last 168 hours. Lipid Profile: No results for input(s): "CHOL", "HDL", "LDLCALC", "TRIG", "CHOLHDL", "LDLDIRECT" in the last 72 hours. Thyroid  Function Tests: No results for input(s): "TSH", "T4TOTAL", "FREET4", "T3FREE", "THYROIDAB" in the last 72 hours. Anemia Panel: Recent Labs    01/13/24 1041  VITAMINB12 51*  FOLATE 19.3  FERRITIN 163  TIBC 250  IRON 61  RETICCTPCT 3.4*   Urine analysis:    Component Value Date/Time   COLORURINE STRAW (A) 01/13/2024 1139   APPEARANCEUR CLEAR 01/13/2024 1139   LABSPEC 1.008 01/13/2024 1139   PHURINE 6.0 01/13/2024 1139   GLUCOSEU NEGATIVE 01/13/2024 1139   HGBUR NEGATIVE 01/13/2024 1139   BILIRUBINUR NEGATIVE 01/13/2024 1139   KETONESUR NEGATIVE 01/13/2024 1139   PROTEINUR NEGATIVE 01/13/2024 1139   NITRITE NEGATIVE 01/13/2024 1139   LEUKOCYTESUR TRACE (A) 01/13/2024 1139   Sepsis Labs: @LABRCNTIP (procalcitonin:4,lacticidven:4)  )No results found for this or any previous visit (from the past 240 hours).    Studies: No results found.  Scheduled Meds:  amLODipine   2.5 mg Oral Daily   aspirin EC  81 mg Oral Daily   carvedilol   12.5 mg Oral BID WC   heparin  5,000 Units Subcutaneous Q8H   loratadine   10 mg Oral QPM   montelukast   10 mg Oral QHS    Continuous Infusions:  azithromycin  500 mg (01/14/24 1438)   cefTRIAXone  (ROCEPHIN )  IV       LOS: 1 day     Deshan Hemmelgarn J [place] Festa, MD [facility]  If 7PM-7AM, please contact night-coverage [email or web address] 01/14/2024, 2:50 PM

## 2024-01-15 ENCOUNTER — Inpatient Hospital Stay (HOSPITAL_COMMUNITY)

## 2024-01-15 DIAGNOSIS — M25462 Effusion, left knee: Secondary | ICD-10-CM | POA: Diagnosis not present

## 2024-01-15 DIAGNOSIS — M79661 Pain in right lower leg: Secondary | ICD-10-CM | POA: Diagnosis not present

## 2024-01-15 DIAGNOSIS — M1711 Unilateral primary osteoarthritis, right knee: Secondary | ICD-10-CM | POA: Diagnosis not present

## 2024-01-15 DIAGNOSIS — M25461 Effusion, right knee: Secondary | ICD-10-CM | POA: Diagnosis not present

## 2024-01-15 DIAGNOSIS — J189 Pneumonia, unspecified organism: Secondary | ICD-10-CM | POA: Diagnosis not present

## 2024-01-15 DIAGNOSIS — M79662 Pain in left lower leg: Secondary | ICD-10-CM | POA: Diagnosis not present

## 2024-01-15 LAB — COMPREHENSIVE METABOLIC PANEL WITH GFR
ALT: 39 U/L (ref 0–44)
AST: 41 U/L (ref 15–41)
Albumin: 2.9 g/dL — ABNORMAL LOW (ref 3.5–5.0)
Alkaline Phosphatase: 70 U/L (ref 38–126)
Anion gap: 4 — ABNORMAL LOW (ref 5–15)
BUN: 13 mg/dL (ref 8–23)
CO2: 23 mmol/L (ref 22–32)
Calcium: 8.9 mg/dL (ref 8.9–10.3)
Chloride: 106 mmol/L (ref 98–111)
Creatinine, Ser: 0.56 mg/dL (ref 0.44–1.00)
GFR, Estimated: >60 mL/min (ref >60)
Glucose, Bld: 114 mg/dL — ABNORMAL HIGH (ref 70–99)
Potassium: 4.7 mmol/L (ref 3.5–5.1)
Sodium: 133 mmol/L — ABNORMAL LOW (ref 135–145)
Total Bilirubin: 1 mg/dL (ref 0.0–1.2)
Total Protein: 6.1 g/dL — ABNORMAL LOW (ref 6.5–8.1)

## 2024-01-15 LAB — MAGNESIUM: Magnesium: 1.8 mg/dL (ref 1.7–2.4)

## 2024-01-15 LAB — CBC
HCT: 33.7 % — ABNORMAL LOW (ref 36.0–46.0)
Hemoglobin: 11.2 g/dL — ABNORMAL LOW (ref 12.0–15.0)
MCH: 37.8 pg — ABNORMAL HIGH (ref 26.0–34.0)
MCHC: 33.2 g/dL (ref 30.0–36.0)
MCV: 113.9 fL — ABNORMAL HIGH (ref 80.0–100.0)
Platelets: 369 10*3/uL (ref 150–400)
RBC: 2.96 MIL/uL — ABNORMAL LOW (ref 3.87–5.11)
RDW: 15.6 % — ABNORMAL HIGH (ref 11.5–15.5)
WBC: 14.6 10*3/uL — ABNORMAL HIGH (ref 4.0–10.5)
nRBC: 0.3 % — ABNORMAL HIGH (ref 0.0–0.2)

## 2024-01-15 LAB — CK: Total CK: 846 U/L — ABNORMAL HIGH (ref 38–234)

## 2024-01-15 LAB — PROCALCITONIN: Procalcitonin: <0.1 ng/mL

## 2024-01-15 MED ORDER — ONDANSETRON HCL 4 MG/2ML IJ SOLN
4.0000 mg | Freq: Three times a day (TID) | INTRAMUSCULAR | Status: DC | PRN
  Administered 2024-01-15: 4 mg via INTRAVENOUS
  Filled 2024-01-15: qty 2

## 2024-01-15 MED ORDER — POLYETHYLENE GLYCOL 3350 17 G PO PACK
17.0000 g | PACK | Freq: Every day | ORAL | Status: DC
  Administered 2024-01-15 – 2024-01-19 (×5): 17 g via ORAL
  Filled 2024-01-15 (×5): qty 1

## 2024-01-15 MED ORDER — LACTATED RINGERS IV SOLN: INTRAVENOUS | Status: DC

## 2024-01-15 MED ORDER — HYDROCODONE-ACETAMINOPHEN 5-325 MG PO TABS
1.0000 | ORAL_TABLET | Freq: Four times a day (QID) | ORAL | Status: DC | PRN
  Administered 2024-01-15 – 2024-01-19 (×7): 1 via ORAL
  Filled 2024-01-15 (×8): qty 1

## 2024-01-15 NOTE — Evaluation (Signed)
 Physical Therapy Evaluation Patient Details Name: Kristen Cox MRN: [id number] DOB: 1940/11/05 Today's Date: 01/15/2024  History of Present Illness  Kristen Cox  is a 83 y.o. female, past medical history of hypertension, hyperlipidemia, she presents to ED due to fall, weakness, patient report progressive weakness over the last week, deconditioning, frailty, difficulty ambulating and walking, but she denies any focal deficits, she had 2 falls this morning, she denies loss of consciousness, head trauma, or any anticoagulation use, no chest pain, no shortness of breath, she does report cough and congestion over the last few days as well.  - NAD chest x-ray significant for left lung base pneumonia with atelectasis, and white blood cell was elevated at 13.6, total CK was elevated at 2007, troponins were elevated 54> 48, but her EKG is nonacute and she denies any chest pain or shortness of breath, [facility] consulted to admit   Clinical Impression  Patient demonstrates slow labored movement for sitting up at bedside with c/o severe pain in bilateral knees, once seated required Max assist for scooting to EOB, very unsteady on feet using RW and unable to transfer using RW due to fall risk and pain. Patient required Max assist with knees blocked to transfer to chair and tolerated sitting up after therapy with her niece present. Patient will benefit from continued skilled physical therapy in hospital and recommended venue below to increase strength, balance, endurance for safe ADLs and gait.          If plan is discharge home, recommend the following: A lot of help with bathing/dressing/bathroom;A lot of help with walking and/or transfers;Help with stairs or ramp for entrance;Assistance with cooking/housework   Can travel by private vehicle   No    Equipment Recommendations None recommended by PT  Recommendations for Other Services       Functional Status Assessment Patient has had a recent  decline in their functional status and demonstrates the ability to make significant improvements in function in a reasonable and predictable amount of time.     Precautions / Restrictions Precautions Precautions: Fall Recall of Precautions/Restrictions: Intact Restrictions Weight Bearing Restrictions Per Provider Order: No      Mobility  Bed Mobility Overal bed mobility: Needs Assistance Bed Mobility: Supine to Sit     Supine to sit: Max assist     General bed mobility comments: slow labored movement with c/o severe pain bilateral knees    Transfers Overall transfer level: Needs assistance Equipment used: Rolling walker (2 wheels) Transfers: Sit to/from Stand, Bed to chair/wheelchair/BSC Sit to Stand: Max assist Stand pivot transfers: Max assist         General transfer comment: patient unable to transfers using RW due to buckling of knees/pain, required stand pivot with knees blocked to transfer to chair    Ambulation/Gait                  Stairs            Wheelchair Mobility     Tilt Bed    Modified Rankin (Stroke Patients Only)       Balance Overall balance assessment: Needs assistance Sitting-balance support: Feet supported, Bilateral upper extremity supported Sitting balance-Leahy Scale: Fair Sitting balance - Comments: seated at EOB   Standing balance support: Reliant on assistive device for balance, During functional activity, Bilateral upper extremity supported Standing balance-Leahy Scale: Poor Standing balance comment: using RW  Pertinent Vitals/Pain Pain Assessment Pain Assessment: Faces Faces Pain Scale: Hurts whole lot Pain Location: bilateral knees, right worse than left Pain Descriptors / Indicators: Discomfort, Moaning, Grimacing, Guarding, Sharp Pain Intervention(s): Limited activity within patient's tolerance, Monitored during session, Repositioned    Home Living Family/patient  expects to be discharged to:: Private residence Living Arrangements: Children Available Help at Discharge: Family;Available PRN/intermittently Type of Home: House Home Access: Stairs to enter Entrance Stairs-Rails: Right;Left (to wide to reach both) Entrance Stairs-Number of Steps: 3   Home Layout: One level Home Equipment: Agricultural consultant (2 wheels);Cane - single point;Grab bars - tub/shower      Prior Function Prior Level of Function : Independent/Modified Independent             Mobility Comments: Household and short distanced community ambulation using RW PRN ADLs Comments: Independent for household, assisted for community ADL's     Extremity/Trunk Assessment   Upper Extremity Assessment Upper Extremity Assessment: Defer to OT evaluation    Lower Extremity Assessment Lower Extremity Assessment: Generalized weakness    Cervical / Trunk Assessment Cervical / Trunk Assessment: Kyphotic  Communication   Communication Communication: No apparent difficulties    Cognition Arousal: Alert Behavior During Therapy: WFL for tasks assessed/performed   PT - Cognitive impairments: No apparent impairments                         Following commands: Intact       Cueing Cueing Techniques: Verbal cues, Tactile cues     General Comments      Exercises     Assessment/Plan    PT Assessment Patient needs continued PT services  PT Problem List Decreased strength;Decreased activity tolerance;Decreased balance;Decreased mobility;Pain       PT Treatment Interventions DME instruction;Gait training;Stair training;Functional mobility training;Therapeutic activities;Therapeutic exercise;Balance training;Patient/family education    PT Goals (Current goals can be found in the Care Plan section)  Acute Rehab PT Goals Patient Stated Goal: return home after rehab PT Goal Formulation: With patient/family Time For Goal Achievement: 01/29/24 Potential to Achieve Goals:  Good    Frequency Min 3X/week     Co-evaluation               AM-PAC PT "6 Clicks" Mobility  Outcome Measure Help needed turning from your back to your side while in a flat bed without using bedrails?: A Lot Help needed moving from lying on your back to sitting on the side of a flat bed without using bedrails?: A Lot Help needed moving to and from a bed to a chair (including a wheelchair)?: A Lot Help needed standing up from a chair using your arms (e.g., wheelchair or bedside chair)?: A Lot Help needed to walk in hospital room?: Total Help needed climbing 3-5 steps with a railing? : Total 6 Click Score: 10    End of Session   Activity Tolerance: Patient tolerated treatment well;Patient limited by fatigue;Patient limited by pain Patient left: in chair;with call bell/phone within reach Nurse Communication: Mobility status PT Visit Diagnosis: Unsteadiness on feet (R26.81);Other abnormalities of gait and mobility (R26.89);Muscle weakness (generalized) (M62.81)    Time: 1610-9604 PT Time Calculation (min) (ACUTE ONLY): 20 min   Charges:   PT Evaluation $PT Eval Moderate Complexity: 1 Mod PT Treatments $Therapeutic Activity: 8-22 mins PT General Charges $$ ACUTE PT VISIT: 1 Visit         12:57 PM, 01/15/24 Walton Guppy, MPT Physical Therapist with [facility]  [phone] office [phone] mobile phone

## 2024-01-15 NOTE — Plan of Care (Signed)

## 2024-01-15 NOTE — Progress Notes (Signed)
   01/15/24 1810  [place] Medicaid [place] Level of Care Screening Tool  Requested Level of Care SNF  Discharge Plan SNF  Orientation Self;Time;Situation  Personal Care Assistance Bathing;Feeding;Dressing  Bathing Assistance Limited assistance  Feeding assistance Limited assistance  Dressing Assistance Limited assistance  Ambulatory Status Limited Assist  Functional Limitations Sight;Hearing;Speech  Sight Info Adequate  Hearing Info Adequate  Speech Info Adequate  Bladder Continent  Bowel Continent  Communication of Needs Verbally  Skin Normal  Respiration Normal  Nutrition Status Diet (Regular)  Special Care Factors PT (By licensed PT);OT (By licensed OT)  PT Frequency 5 times  weekly  OT Frequency 5  times weekly  Additional Special Care Factors Code Status  Code Status Info Full  Contractures Info Not present  Additional Information SSN # [id number]   First choice [facility].

## 2024-01-15 NOTE — Progress Notes (Signed)
 PROGRESS NOTE  Kristen Cox YNW:[id number] DOB: Dec 04, 1940 DOA: 01/13/2024 PCP: Towanda Fret, MD  HPI/Recap of past 24 hours: Kristen Cox is a 83 y.o. female, PMH of HTN, HLD presented to ED due to likely mechanical fall, with progressive weakness over the last week. Denies any LOC, head trauma.  In the ED, vital signs fairly stable.  Labs with mildly elevated AST, ALT, elevated CK, elevated troponin 54-->48, leukocytosis, B12 deficiency. Chest x-ray significant for left lung base pneumonia with atelectasis. EKG is non-acute.  Patient admitted for further management.    Today, patient reports bilateral lower extremity pain, worse around right knee.  Noted shallow knee wound noted bilaterally, likely after fall.     Assessment/Plan: Principal Problem:   Pneumonia Active Problems:   Hyperlipemia   Essential hypertension   H/O falling   Rhabdomyolysis   Lobar pneumonia (HCC)   Likely CAP Afebrile with resolving leukocytosis BC x 2 collected on 5/25, after antibiotics have been initiated Urine strep pneumo negative, Legionella pending Procalcitonin negative CXR significant for left lung base opacity Continue ceftriaxone , azithromycin  Incentive spirometer and flutter valve   Fall Generalized weakness Likely 2/2 above PT/OT-recommending SNF Fall precautions   Rhabdomyolysis  CK 2007-->1503-->846 Likely 2/2 above Continue gentle IV fluids  Mild elevated LFTs AST 74, ALT 61, likely 2/2 above Daily CMP  Mild elevated troponin Flat trend, denies any chest pain, shortness of breath EKG with no acute ST changes Likely 2/2 rhabdo  Bilateral lower extremity knee arthritis Likely exacerbated after fall DG knee x-ray bilateral showed advanced arthritis Pain management, PT/OT  Hypokalemia/hypomagnesemia Replace as needed  Macrocytic anemia Vitamin B12 deficiency B12--> 51 Supplement B12  Hypertension BP stable Continue amlodipine , Coreg     Hyperlipidemia Hold statin and Zetia  for now, resume once stable and total CK normalized  Obesity type I Lifestyle modification advised       Estimated body mass index is 30.97 kg/m as calculated from the following:   Height as of this encounter: 5\' 2"  (1.575 m).   Weight as of this encounter: 76.8 kg.     Code Status: Full  Family Communication: None at bedside  Disposition Plan: Status is: Inpatient Remains inpatient appropriate because: Level of care      Consultants: None  Procedures: None  Antimicrobials: Ceftriaxone  Azithromycin   DVT prophylaxis: Lovenox    Objective: Vitals:   01/14/24 2014 01/15/24 0426 01/15/24 0910 01/15/24 1426  BP: (!) 150/76 (!) 154/76 (!) 157/72 118/87  Pulse: 88 81 80 91  Resp: 18 20 18 17   Temp: 98.2 F (36.8 C) 98.1 F (36.7 C) 98.6 F (37 C) 98.3 F (36.8 C)  TempSrc: Oral Oral Oral   SpO2: 98% 99% 97% 100%  Weight:      Height:        Intake/Output Summary (Last 24 hours) at 01/15/2024 1618 Last data filed at 01/15/2024 1300 Gross per 24 hour  Intake 1284.87 ml  Output 1050 ml  Net 234.87 ml   Filed Weights   01/13/24 0926 01/13/24 1930  Weight: 64 kg 76.8 kg    Exam: General: NAD  Cardiovascular: S1, S2 present Respiratory: CTAB Abdomen: Soft, nontender, nondistended, bowel sounds present Musculoskeletal: No bilateral pedal edema noted Skin: Normal Psychiatry: Normal mood     Data Reviewed: CBC: Recent Labs  Lab 01/10/24 0948 01/13/24 1041 01/14/24 0414 01/15/24 0303  WBC 8.9 13.6* 11.6* 14.6*  NEUTROABS 5.2 9.6*  --   --   HGB 10.7* 10.6* 9.4* 11.2*  HCT 32.3* 31.1* 28.3* 33.7*  MCV 112* 109.5* 110.5* 113.9*  PLT 608* 481* 429* 369   Basic Metabolic Panel: Recent Labs  Lab 01/10/24 0948 01/13/24 1132 01/14/24 0414 01/14/24 0720 01/15/24 0303  NA 139 135 138  --  133*  K 5.0 3.5 3.3*  --  4.7  CL 103 104 109  --  106  CO2 22 24 26   --  23  GLUCOSE 91 106* 98  --  114*  BUN  11 17 18   --  13  CREATININE 0.71 0.66 0.56  --  0.56  CALCIUM  10.3 9.7 8.9  --  8.9  MG  --   --   --  1.6* 1.8   GFR: Estimated Creatinine Clearance: 52 mL/min (by C-G formula based on SCr of 0.56 mg/dL). Liver Function Tests: Recent Labs  Lab 01/10/24 0948 01/13/24 1132 01/15/24 0303  AST 22 74* 41  ALT 16 61* 39  ALKPHOS 100 71 70  BILITOT 0.4 0.6 1.0  PROT 6.8 7.1 6.1*  ALBUMIN 3.9 3.4* 2.9*   No results for input(s): "LIPASE", "AMYLASE" in the last 168 hours. No results for input(s): "AMMONIA" in the last 168 hours. Coagulation Profile: No results for input(s): "INR", "PROTIME" in the last 168 hours. Cardiac Enzymes: Recent Labs  Lab 01/13/24 1132 01/14/24 0414 01/15/24 0303  CKTOTAL 2,007* 1,503* 846*   BNP (last 3 results) No results for input(s): "PROBNP" in the last 8760 hours. HbA1C: No results for input(s): "HGBA1C" in the last 72 hours. CBG: No results for input(s): "GLUCAP" in the last 168 hours. Lipid Profile: No results for input(s): "CHOL", "HDL", "LDLCALC", "TRIG", "CHOLHDL", "LDLDIRECT" in the last 72 hours. Thyroid  Function Tests: No results for input(s): "TSH", "T4TOTAL", "FREET4", "T3FREE", "THYROIDAB" in the last 72 hours. Anemia Panel: Recent Labs    01/13/24 1041  VITAMINB12 51*  FOLATE 19.3  FERRITIN 163  TIBC 250  IRON 61  RETICCTPCT 3.4*   Urine analysis:    Component Value Date/Time   COLORURINE STRAW (A) 01/13/2024 1139   APPEARANCEUR CLEAR 01/13/2024 1139   LABSPEC 1.008 01/13/2024 1139   PHURINE 6.0 01/13/2024 1139   GLUCOSEU NEGATIVE 01/13/2024 1139   HGBUR NEGATIVE 01/13/2024 1139   BILIRUBINUR NEGATIVE 01/13/2024 1139   KETONESUR NEGATIVE 01/13/2024 1139   PROTEINUR NEGATIVE 01/13/2024 1139   NITRITE NEGATIVE 01/13/2024 1139   LEUKOCYTESUR TRACE (A) 01/13/2024 1139   Sepsis Labs: @LABRCNTIP (procalcitonin:4,lacticidven:4)  ) Recent Results (from the past 240 hours)  Resp panel by RT-PCR (RSV, Flu A&B, Covid)  Anterior Nasal Swab     Status: None   Collection Time: 01/14/24  7:18 AM   Specimen: Anterior Nasal Swab  Result Value Ref Range Status   SARS Coronavirus 2 by RT PCR NEGATIVE NEGATIVE Final    Comment: (NOTE) SARS-CoV-2 target nucleic acids are NOT DETECTED.  The SARS-CoV-2 RNA is generally detectable in upper respiratory specimens during the acute phase of infection. The lowest concentration of SARS-CoV-2 viral copies this assay can detect is 138 copies/mL. A negative result does not preclude SARS-Cov-2 infection and should not be used as the sole basis for treatment or other patient management decisions. A negative result may occur with  improper specimen collection/handling, submission of specimen other than nasopharyngeal swab, presence of viral mutation(s) within the areas targeted by this assay, and inadequate number of viral copies(<138 copies/mL). A negative result must be combined with clinical observations, patient history, and epidemiological information. The expected result is Negative.  Fact Sheet  for Patients:  BloggerCourse.com  Fact Sheet for Healthcare Providers:  SeriousBroker.it  This test is no t yet approved or cleared by the [place]  FDA and  has been authorized for detection and/or diagnosis of SARS-CoV-2 by FDA under an Emergency Use Authorization (EUA). This EUA will remain  in effect (meaning this test can be used) for the duration of the COVID-19 declaration under Section 564(b)(1) of the Act, 21 U.S.C.section 360bbb-3(b)(1), unless the authorization is terminated  or revoked sooner.       Influenza A by PCR NEGATIVE NEGATIVE Final   Influenza B by PCR NEGATIVE NEGATIVE Final    Comment: (NOTE) The Xpert Xpress SARS-CoV-2/FLU/RSV plus assay is intended as an aid in the diagnosis of influenza from Nasopharyngeal swab specimens and should not be used as a sole basis for treatment. Nasal washings  and aspirates are unacceptable for Xpert Xpress SARS-CoV-2/FLU/RSV testing.  Fact Sheet for Patients: BloggerCourse.com  Fact Sheet for Healthcare Providers: SeriousBroker.it  This test is not yet approved or cleared by the [place]  FDA and has been authorized for detection and/or diagnosis of SARS-CoV-2 by FDA under an Emergency Use Authorization (EUA). This EUA will remain in effect (meaning this test can be used) for the duration of the COVID-19 declaration under Section 564(b)(1) of the Act, 21 U.S.C. section 360bbb-3(b)(1), unless the authorization is terminated or revoked.     Resp Syncytial Virus by PCR NEGATIVE NEGATIVE Final    Comment: (NOTE) Fact Sheet for Patients: BloggerCourse.com  Fact Sheet for Healthcare Providers: SeriousBroker.it  This test is not yet approved or cleared by the [place]  FDA and has been authorized for detection and/or diagnosis of SARS-CoV-2 by FDA under an Emergency Use Authorization (EUA). This EUA will remain in effect (meaning this test can be used) for the duration of the COVID-19 declaration under Section 564(b)(1) of the Act, 21 U.S.C. section 360bbb-3(b)(1), unless the authorization is terminated or revoked.  Performed at [facility], [address], [place], [place] [address]   Culture, blood (Routine X 2) w Reflex to ID Panel     Status: None (Preliminary result)   Collection Time: 01/15/24  8:10 AM   Specimen: BLOOD  Result Value Ref Range Status   Specimen Description BLOOD RIGHT ANTECUBITAL  Final   Special Requests   Final    AEROBIC BOTTLE ONLY Blood Culture adequate volume Performed at [facility], [address], [place], [place] [address]    Culture PENDING  Incomplete   Report Status PENDING  Incomplete  Culture, blood (Routine X 2) w Reflex to ID Panel     Status: None (Preliminary result)   Collection  Time: 01/15/24  8:10 AM   Specimen: BLOOD  Result Value Ref Range Status   Specimen Description BLOOD BLOOD RIGHT ARM  Final   Special Requests   Final    AEROBIC BOTTLE ONLY Blood Culture adequate volume Performed at [facility], [address] [place], [place] [address]    Culture PENDING  Incomplete   Report Status PENDING  Incomplete      Studies: DG Knee Right Port Result Date: 01/15/2024 EXAM: 1 or 2 VIEW(S) XRAY OF THE RIGHT KNEE 01/15/2024 09:43:57 AM COMPARISON: 02/09/2022 CLINICAL HISTORY: [id number] Fall [id number]. Pt c/o severe bilateral leg pain from knee to foot, right worse than left, recent falls x 2 in the last weak. FINDINGS: BONES AND JOINTS: No acute fracture. No focal osseous lesion. No joint dislocation. Degenerative changes are similar to the prior study.  A large joint effusion is present. Chondrocalcinosis is present. Loose bodies are present. SOFT TISSUES: The soft tissues are unremarkable. IMPRESSION: 1. No acute fracture or dislocation. 2. Large joint effusion, chondrocalcinosis, and loose bodies. Electronically signed by: Audree Leas MD 01/15/2024 11:49 AM EDT RP Workstation: [id number]   DG Tibia/Fibula Right Port Result Date: 01/15/2024 CLINICAL DATA:  Severe bilateral leg pain.  Recent falls. EXAM: PORTABLE RIGHT TIBIA AND FIBULA - 2 VIEW COMPARISON:  01/27/2022. FINDINGS: There are no signs of acute fracture or dislocation. Moderate to advanced degenerative changes noted in the knee with mild ankle joint osteoarthritis. Soft tissues are unremarkable. IMPRESSION: 1. No acute findings. 2. Moderate to advanced knee osteoarthritis. Electronically Signed   By: Kimberley Penman M.D.   On: 01/15/2024 11:49   DG Tibia/Fibula Left Port Result Date: 01/15/2024 CLINICAL DATA:  Severe bilateral leg pain. EXAM: PORTABLE LEFT TIBIA AND FIBULA - 2 VIEW COMPARISON:  01/27/2022. FINDINGS: There are no signs of acute fracture or dislocation. Degenerative changes are identified  within the left knee. There is a curvilinear ossific density adjacent to the medial femoral condyle. Which appears unchanged from the previous exam and may reflect sequelae of remote medial collateral ligament injury. IMPRESSION: 1. No acute findings. 2. Left knee osteoarthritis. Electronically Signed   By: Kimberley Penman M.D.   On: 01/15/2024 11:47   DG Knee Left Port Result Date: 01/15/2024 EXAM: 2 VIEW(S) XRAY OF THE LEFT KNEE 01/15/2024 09:43:57 AM COMPARISON: 01/27/2022 CLINICAL HISTORY: [id number] Fall 324401. Pt c/o severe bilateral leg pain from knee to foot, right worse than left, recent falls x 2 in the last weak FINDINGS: BONES AND JOINTS: No acute fracture. No focal osseous lesion. No joint dislocation. Joint effusion is present. Tri compartmental degenerative changes are similar to the prior study. SOFT TISSUES: Atherosclerotic calcifications are present. IMPRESSION: 1. No acute fracture or dislocation. 2. Joint effusion. 3. Tri compartmental degenerative changes, similar to the prior study. Electronically signed by: Audree Leas MD 01/15/2024 11:47 AM EDT RP Workstation: [id number]    Scheduled Meds:  amLODipine   2.5 mg Oral Daily   aspirin  EC  81 mg Oral Daily   carvedilol   12.5 mg Oral BID WC   vitamin B-12  1,000 mcg Oral Daily   enoxaparin  (LOVENOX ) injection  40 mg Subcutaneous Q24H   loratadine   10 mg Oral QPM   montelukast   10 mg Oral QHS   polyethylene glycol  17 g Oral Daily    Continuous Infusions:  azithromycin  Stopped (01/14/24 1538)   cefTRIAXone  (ROCEPHIN )  IV Stopped (01/14/24 1707)     LOS: 2 days     Veronica Gordon, MD [facility]  If 7PM-7AM, please contact night-coverage [email or web address] 01/15/2024, 4:18 PM

## 2024-01-15 NOTE — Plan of Care (Signed)
  Problem: Acute Rehab PT Goals(only PT should resolve) Goal: Pt Will Go Supine/Side To Sit Outcome: Progressing Flowsheets (Taken 01/15/2024 1258) Pt will go Supine/Side to Sit:  with minimal assist  with moderate assist Goal: Patient Will Transfer Sit To/From Stand Outcome: Progressing Flowsheets (Taken 01/15/2024 1258) Patient will transfer sit to/from stand: with minimal assist Goal: Pt Will Transfer Bed To Chair/Chair To Bed Outcome: Progressing Flowsheets (Taken 01/15/2024 1258) Pt will Transfer Bed to Chair/Chair to Bed: with mod assist Goal: Pt Will Ambulate Outcome: Progressing Flowsheets (Taken 01/15/2024 1258) Pt will Ambulate:  15 feet  with moderate assist  with rolling walker   12:59 PM, 01/15/24 Walton Guppy, MPT Physical Therapist with [facility] [phone] office [phone] mobile phone

## 2024-01-16 ENCOUNTER — Inpatient Hospital Stay (HOSPITAL_COMMUNITY)

## 2024-01-16 DIAGNOSIS — J189 Pneumonia, unspecified organism: Secondary | ICD-10-CM | POA: Diagnosis not present

## 2024-01-16 LAB — LEGIONELLA PNEUMOPHILA SEROGP 1 UR AG: L. pneumophila Serogp 1 Ur Ag: NEGATIVE

## 2024-01-16 LAB — BASIC METABOLIC PANEL WITH GFR
Anion gap: 14 (ref 5–15)
BUN: 15 mg/dL (ref 8–23)
CO2: 21 mmol/L — ABNORMAL LOW (ref 22–32)
Calcium: 8.8 mg/dL — ABNORMAL LOW (ref 8.9–10.3)
Chloride: 97 mmol/L — ABNORMAL LOW (ref 98–111)
Creatinine, Ser: 0.56 mg/dL (ref 0.44–1.00)
GFR, Estimated: >60 mL/min (ref >60)
Glucose, Bld: 96 mg/dL (ref 70–99)
Potassium: 4.3 mmol/L (ref 3.5–5.1)
Sodium: 132 mmol/L — ABNORMAL LOW (ref 135–145)

## 2024-01-16 LAB — CBC
HCT: 31.8 % — ABNORMAL LOW (ref 36.0–46.0)
Hemoglobin: 10.4 g/dL — ABNORMAL LOW (ref 12.0–15.0)
MCH: 36.9 pg — ABNORMAL HIGH (ref 26.0–34.0)
MCHC: 32.7 g/dL (ref 30.0–36.0)
MCV: 112.8 fL — ABNORMAL HIGH (ref 80.0–100.0)
Platelets: 309 10*3/uL (ref 150–400)
RBC: 2.82 MIL/uL — ABNORMAL LOW (ref 3.87–5.11)
RDW: 15.6 % — ABNORMAL HIGH (ref 11.5–15.5)
WBC: 14.9 10*3/uL — ABNORMAL HIGH (ref 4.0–10.5)
nRBC: 0.2 % (ref 0.0–0.2)

## 2024-01-16 LAB — CK: Total CK: 285 U/L — ABNORMAL HIGH (ref 38–234)

## 2024-01-16 NOTE — Plan of Care (Signed)
  Problem: Acute Rehab OT Goals (only OT should resolve) Goal: Pt. Will Perform Grooming Flowsheets (Taken 01/16/2024 1101) Pt Will Perform Grooming:  sitting  with modified independence Goal: Pt. Will Perform Upper Body Dressing Flowsheets (Taken 01/16/2024 1101) Pt Will Perform Upper Body Dressing:  sitting  with modified independence Goal: Pt. Will Perform Lower Body Dressing Flowsheets (Taken 01/16/2024 1101) Pt Will Perform Lower Body Dressing:  sitting/lateral leans  with contact guard assist  with min assist Goal: Pt. Will Transfer To Toilet Flowsheets (Taken 01/16/2024 1101) Pt Will Transfer to Toilet:  with min assist  stand pivot transfer Goal: Pt. Will Perform Toileting-Clothing Manipulation Flowsheets (Taken 01/16/2024 1101) Pt Will Perform Toileting - Clothing Manipulation and hygiene:  with min assist  sitting/lateral leans Goal: Pt/Caregiver Will Perform Home Exercise Program Flowsheets (Taken 01/16/2024 1101) Pt/caregiver will Perform Home Exercise Program:  Increased strength  Both right and left upper extremity  Independently  Kaydance Bowie OT, MOT

## 2024-01-16 NOTE — Evaluation (Signed)
 Occupational Therapy Evaluation Patient Details Name: Kristen Cox MRN: [id number] DOB: [birth year] Today's Date: 01/16/2024   History of Present Illness   Kristen Cox  is a 83 y.o. female, past medical history of hypertension, hyperlipidemia, she presents to ED due to fall, weakness, patient report progressive weakness over the last week, deconditioning, frailty, difficulty ambulating and walking, but she denies any focal deficits, she had 2 falls this morning, she denies loss of consciousness, head trauma, or any anticoagulation use, no chest pain, no shortness of breath, she does report cough and congestion over the last few days as well.  - NAD chest x-ray significant for left lung base pneumonia with atelectasis, and white blood cell was elevated at 13.6, total CK was elevated at 2007, troponins were elevated 54> 48, but her EKG is nonacute and she denies any chest pain or shortness of breath, [facility] consulted to admit     Clinical Impressions Pt agreeable to OT treatment. Pt complaining of much pain in R LE and L UE. Pt required max A for bed mobility and max to total for transfer to the chair without RW due to high risk of pt buckling  B LE. B UE weak with reports of a sore L arm. Noted poor fine motor skills bilaterally. Pt left in the chair with call bell within reach. Pt will benefit from continued OT in the hospital and recommended venue below to increase strength, balance, and endurance for safe ADL's.        If plan is discharge home, recommend the following:   A lot of help with walking and/or transfers;A lot of help with bathing/dressing/bathroom;Assistance with cooking/housework;Assist for transportation;Help with stairs or ramp for entrance     Functional Status Assessment   Patient has had a recent decline in their functional status and demonstrates the ability to make significant improvements in function in a reasonable and predictable amount of time.      Equipment Recommendations   None recommended by OT             Precautions/Restrictions   Precautions Precautions: Fall Recall of Precautions/Restrictions: Intact Restrictions Weight Bearing Restrictions Per Provider Order: No     Mobility Bed Mobility Overal bed mobility: Needs Assistance Bed Mobility: Supine to Sit     Supine to sit: Max assist, HOB elevated     General bed mobility comments: labored; increased pain; HOB slightly elevated    Transfers Overall transfer level: Needs assistance Equipment used: Rolling walker (2 wheels) Transfers: Sit to/from Stand, Bed to chair/wheelchair/BSC Sit to Stand: Max assist, Total assist (once with RW and twice without) Stand pivot transfers: Total assist, Max assist         General transfer comment: Poor ability to maintain weight bearing on B LE. Graded to SPT without RW due to pt seeming to not be able to bear weight on her own safely.      Balance Overall balance assessment: Needs assistance Sitting-balance support: No upper extremity supported, Feet supported Sitting balance-Leahy Scale: Fair Sitting balance - Comments: seated at EOB   Standing balance support: Reliant on assistive device for balance, During functional activity, Bilateral upper extremity supported Standing balance-Leahy Scale: Poor Standing balance comment: using RW                           ADL either performed or assessed with clinical judgement   ADL Overall ADL's : Needs assistance/impaired  Grooming: Set up;Sitting   Upper Body Bathing: Set up;Sitting;Minimal assistance   Lower Body Bathing: Maximal assistance;Total assistance;Sitting/lateral leans   Upper Body Dressing : Set up;Minimal assistance;Sitting   Lower Body Dressing: Maximal assistance;Total assistance;Sitting/lateral leans   Toilet Transfer: Maximal assistance;Total assistance;Stand-pivot Toilet Transfer Details (indicate cue type and reason):  Simulated via EOB to chair without RW Toileting- Clothing Manipulation and Hygiene: Total assistance;Maximal assistance;Bed level               Vision Baseline Vision/History: 1 Wears glasses Ability to See in Adequate Light: 1 Impaired Patient Visual Report: No change from baseline Vision Assessment?: No apparent visual deficits     Perception Perception: Not tested       Praxis Praxis: Not tested       Pertinent Vitals/Pain Pain Assessment Faces Pain Scale: Hurts even more Pain Location: R leg and L arm Pain Descriptors / Indicators: Grimacing, Guarding, Moaning, Discomfort Pain Intervention(s): Limited activity within patient's tolerance, Monitored during session, Repositioned     Extremity/Trunk Assessment Upper Extremity Assessment Upper Extremity Assessment: Generalized weakness (B UE decreased fine motor skills for sequential finger touching. Pt reporting pain in L UE that has started since last night. Reported that L UE is sore.)   Lower Extremity Assessment Lower Extremity Assessment: Defer to PT evaluation   Cervical / Trunk Assessment Cervical / Trunk Assessment: Kyphotic   Communication Communication Communication: No apparent difficulties   Cognition Arousal: Alert Behavior During Therapy: WFL for tasks assessed/performed Cognition: No apparent impairments                               Following commands: Intact       Cueing  General Comments   Cueing Techniques: Verbal cues;Tactile cues                 Home Living Family/patient expects to be discharged to:: Private residence Living Arrangements: Children Available Help at Discharge: Family;Available PRN/intermittently Type of Home: House Home Access: Stairs to enter [employer] of Steps: 3 Entrance Stairs-Rails: Right;Left Home Layout: One level     Bathroom Shower/Tub: Chief Strategy Officer: Standard Bathroom Accessibility: Yes   Home  Equipment: Agricultural consultant (2 wheels);Cane - single point;Grab bars - tub/shower   Additional Comments: per chart      Prior Functioning/Environment Prior Level of Function : Independent/Modified Independent             Mobility Comments: Household and short distanced community ambulation using RW PRN ADLs Comments: Independent for household, assisted for community ADL's; able to cook and clean some.    OT Problem List: Decreased strength;Decreased activity tolerance;Impaired balance (sitting and/or standing);Decreased coordination;Pain;Obesity   OT Treatment/Interventions: Self-care/ADL training;Therapeutic exercise;DME and/or AE instruction;Energy conservation;Therapeutic activities;Patient/family education;Balance training      OT Goals(Current goals can be found in the care plan section)   Acute Rehab OT Goals Patient Stated Goal: improve strength OT Goal Formulation: With patient Time For Goal Achievement: 01/30/24 Potential to Achieve Goals: Fair   OT Frequency:  Min 2X/week                                   End of Session Equipment Utilized During Treatment: Rolling walker (2 wheels);Gait belt Nurse Communication: Mobility status  Activity Tolerance: Patient tolerated treatment well Patient left: in chair;with call bell/phone within reach  OT  Visit Diagnosis: Unsteadiness on feet (R26.81);Other abnormalities of gait and mobility (R26.89);Muscle weakness (generalized) (M62.81);History of falling (Z91.81)                Time: 8295-6213 OT Time Calculation (min): 17 min Charges:  OT General Charges $OT Visit: 1 Visit OT Evaluation $OT Eval Low Complexity: 1 Low  Waylon Hershey OT, MOT  Thurnell Floss 01/16/2024, 10:59 AM

## 2024-01-16 NOTE — Progress Notes (Addendum)
 PROGRESS NOTE  Kristen Cox:[id number] DOB: March 04, 1941 DOA: 01/13/2024 PCP: Towanda Fret, MD  HPI/Recap of past 24 hours: Kristen Cox is a 83 y.o. female, PMH of HTN, HLD presented to ED due to likely mechanical fall, with progressive weakness over the last week. Denies any LOC, head trauma.  In the ED, vital signs fairly stable.  Labs with mildly elevated AST, ALT, elevated CK, elevated troponin 54-->48, leukocytosis, B12 deficiency. Chest x-ray significant for left lung base pneumonia with atelectasis. EKG is non-acute.  Patient admitted for further management.    Today, patient reports feeling much better, denies any new complaints.     Assessment/Plan: Principal Problem:   Pneumonia Active Problems:   Hyperlipemia   Essential hypertension   H/O falling   Rhabdomyolysis   Lobar pneumonia (HCC)   Likely CAP Afebrile with persistent leukocytosis BC x 2 collected on 5/25, after antibiotics have been initiated Urine strep pneumo, Legionella negative Procalcitonin negative CXR significant for left lung base opacity Continue ceftriaxone , azithromycin  for now Incentive spirometer and flutter valve   Fall Generalized weakness Likely 2/2 above PT/OT-recommending SNF Fall precautions   Rhabdomyolysis  CK 2007-->1503-->846-->285 Likely 2/2 above S/p IVF  Mild elevated LFTs AST 74, ALT 61, likely 2/2 above Resolved  Mild elevated troponin Flat trend, denies any chest pain, shortness of breath EKG with no acute ST changes Likely 2/2 rhabdo  Bilateral lower extremity knee arthritis with pain Likely exacerbated after fall DG knee x-ray bilateral showed advanced arthritis Pain management, PT/OT  Hypokalemia/hypomagnesemia Replace as needed  Macrocytic anemia Vitamin B12 deficiency B12--> 51 Supplement B12  Hypertension BP stable Continue amlodipine , Coreg    Hyperlipidemia Hold statin and Zetia  for now, resume once stable and total CK  normalized  Obesity type I Lifestyle modification advised       Estimated body mass index is 30.97 kg/m as calculated from the following:   Height as of this encounter: 5\' 2"  (1.575 m).   Weight as of this encounter: 76.8 kg.     Code Status: Full  Family Communication: None at bedside  Disposition Plan: Status is: Inpatient Remains inpatient appropriate because: Level of care      Consultants: None  Procedures: None  Antimicrobials: Ceftriaxone  Azithromycin   DVT prophylaxis: Lovenox    Objective: Vitals:   01/15/24 1426 01/15/24 2028 01/16/24 0527 01/16/24 1330  BP: 118/87 129/60 133/75 (!) 136/59  Pulse: 91 81 92 97  Resp: 17 18 16 18   Temp: 98.3 F (36.8 C) 99.1 F (37.3 C) 100 F (37.8 C) 99.2 F (37.3 C)  TempSrc:  Oral Oral   SpO2: 100% 97% 95% 97%  Weight:      Height:        Intake/Output Summary (Last 24 hours) at 01/16/2024 1551 Last data filed at 01/16/2024 1358 Gross per 24 hour  Intake 2372.3 ml  Output 900 ml  Net 1472.3 ml   Filed Weights   01/13/24 0926 01/13/24 1930  Weight: 64 kg 76.8 kg    Exam: General: NAD  Cardiovascular: S1, S2 present Respiratory: CTAB Abdomen: Soft, nontender, nondistended, bowel sounds present Musculoskeletal: No bilateral pedal edema noted Skin: Normal Psychiatry: Normal mood     Data Reviewed: CBC: Recent Labs  Lab 01/10/24 0948 01/13/24 1041 01/14/24 0414 01/15/24 0303 01/16/24 0652  WBC 8.9 13.6* 11.6* 14.6* 14.9*  NEUTROABS 5.2 9.6*  --   --   --   HGB 10.7* 10.6* 9.4* 11.2* 10.4*  HCT 32.3* 31.1* 28.3* 33.7* 31.8*  MCV 112* 109.5* 110.5* 113.9* 112.8*  PLT 608* 481* 429* 369 309   Basic Metabolic Panel: Recent Labs  Lab 01/10/24 0948 01/13/24 1132 01/14/24 0414 01/14/24 0720 01/15/24 0303 01/16/24 0652  NA 139 135 138  --  133* 132*  K 5.0 3.5 3.3*  --  4.7 4.3  CL 103 104 109  --  106 97*  CO2 22 24 26   --  23 21*  GLUCOSE 91 106* 98  --  114* 96  BUN 11 17 18    --  13 15  CREATININE 0.71 0.66 0.56  --  0.56 0.56  CALCIUM  10.3 9.7 8.9  --  8.9 8.8*  MG  --   --   --  1.6* 1.8  --    GFR: Estimated Creatinine Clearance: 52 mL/min (by C-G formula based on SCr of 0.56 mg/dL). Liver Function Tests: Recent Labs  Lab 01/10/24 0948 01/13/24 1132 01/15/24 0303  AST 22 74* 41  ALT 16 61* 39  ALKPHOS 100 71 70  BILITOT 0.4 0.6 1.0  PROT 6.8 7.1 6.1*  ALBUMIN 3.9 3.4* 2.9*   No results for input(s): "LIPASE", "AMYLASE" in the last 168 hours. No results for input(s): "AMMONIA" in the last 168 hours. Coagulation Profile: No results for input(s): "INR", "PROTIME" in the last 168 hours. Cardiac Enzymes: Recent Labs  Lab 01/13/24 1132 01/14/24 0414 01/15/24 0303 01/16/24 0652  CKTOTAL 2,007* 1,503* 846* 285*   BNP (last 3 results) No results for input(s): "PROBNP" in the last 8760 hours. HbA1C: No results for input(s): "HGBA1C" in the last 72 hours. CBG: No results for input(s): "GLUCAP" in the last 168 hours. Lipid Profile: No results for input(s): "CHOL", "HDL", "LDLCALC", "TRIG", "CHOLHDL", "LDLDIRECT" in the last 72 hours. Thyroid  Function Tests: No results for input(s): "TSH", "T4TOTAL", "FREET4", "T3FREE", "THYROIDAB" in the last 72 hours. Anemia Panel: No results for input(s): "VITAMINB12", "FOLATE", "FERRITIN", "TIBC", "IRON", "RETICCTPCT" in the last 72 hours.  Urine analysis:    Component Value Date/Time   COLORURINE STRAW (A) 01/13/2024 1139   APPEARANCEUR CLEAR 01/13/2024 1139   LABSPEC 1.008 01/13/2024 1139   PHURINE 6.0 01/13/2024 1139   GLUCOSEU NEGATIVE 01/13/2024 1139   HGBUR NEGATIVE 01/13/2024 1139   BILIRUBINUR NEGATIVE 01/13/2024 1139   KETONESUR NEGATIVE 01/13/2024 1139   PROTEINUR NEGATIVE 01/13/2024 1139   NITRITE NEGATIVE 01/13/2024 1139   LEUKOCYTESUR TRACE (A) 01/13/2024 1139   Sepsis Labs: @LABRCNTIP (procalcitonin:4,lacticidven:4)  ) Recent Results (from the past 240 hours)  Resp panel by RT-PCR  (RSV, Flu A&B, Covid) Anterior Nasal Swab     Status: None   Collection Time: 01/14/24  7:18 AM   Specimen: Anterior Nasal Swab  Result Value Ref Range Status   SARS Coronavirus 2 by RT PCR NEGATIVE NEGATIVE Final    Comment: (NOTE) SARS-CoV-2 target nucleic acids are NOT DETECTED.  The SARS-CoV-2 RNA is generally detectable in upper respiratory specimens during the acute phase of infection. The lowest concentration of SARS-CoV-2 viral copies this assay can detect is 138 copies/mL. A negative result does not preclude SARS-Cov-2 infection and should not be used as the sole basis for treatment or other patient management decisions. A negative result may occur with  improper specimen collection/handling, submission of specimen other than nasopharyngeal swab, presence of viral mutation(s) within the areas targeted by this assay, and inadequate number of viral copies(<138 copies/mL). A negative result must be combined with clinical observations, patient history, and epidemiological information. The expected result is Negative.  Fact  Sheet for Patients:  BloggerCourse.com  Fact Sheet for Healthcare Providers:  SeriousBroker.it  This test is no t yet approved or cleared by the [place]  FDA and  has been authorized for detection and/or diagnosis of SARS-CoV-2 by FDA under an Emergency Use Authorization (EUA). This EUA will remain  in effect (meaning this test can be used) for the duration of the COVID-19 declaration under Section 564(b)(1) of the Act, 21 U.S.C.section 360bbb-3(b)(1), unless the authorization is terminated  or revoked sooner.       Influenza A by PCR NEGATIVE NEGATIVE Final   Influenza B by PCR NEGATIVE NEGATIVE Final    Comment: (NOTE) The Xpert Xpress SARS-CoV-2/FLU/RSV plus assay is intended as an aid in the diagnosis of influenza from Nasopharyngeal swab specimens and should not be used as a sole basis for  treatment. Nasal washings and aspirates are unacceptable for Xpert Xpress SARS-CoV-2/FLU/RSV testing.  Fact Sheet for Patients: BloggerCourse.com  Fact Sheet for Healthcare Providers: SeriousBroker.it  This test is not yet approved or cleared by the [place]  FDA and has been authorized for detection and/or diagnosis of SARS-CoV-2 by FDA under an Emergency Use Authorization (EUA). This EUA will remain in effect (meaning this test can be used) for the duration of the COVID-19 declaration under Section 564(b)(1) of the Act, 21 U.S.C. section 360bbb-3(b)(1), unless the authorization is terminated or revoked.     Resp Syncytial Virus by PCR NEGATIVE NEGATIVE Final    Comment: (NOTE) Fact Sheet for Patients: BloggerCourse.com  Fact Sheet for Healthcare Providers: SeriousBroker.it  This test is not yet approved or cleared by the [place]  FDA and has been authorized for detection and/or diagnosis of SARS-CoV-2 by FDA under an Emergency Use Authorization (EUA). This EUA will remain in effect (meaning this test can be used) for the duration of the COVID-19 declaration under Section 564(b)(1) of the Act, 21 U.S.C. section 360bbb-3(b)(1), unless the authorization is terminated or revoked.  Performed at [facility], [address], [place], [place] [address]   Culture, blood (Routine X 2) w Reflex to ID Panel     Status: None (Preliminary result)   Collection Time: 01/15/24  8:10 AM   Specimen: BLOOD  Result Value Ref Range Status   Specimen Description BLOOD RIGHT ANTECUBITAL  Final   Special Requests AEROBIC BOTTLE ONLY Blood Culture adequate volume  Final   Culture   Final    NO GROWTH < 24 HOURS Performed at [facility], [address] [place], [place] [address]    Report Status PENDING  Incomplete  Culture, blood (Routine X 2) w Reflex to ID Panel     Status: None  (Preliminary result)   Collection Time: 01/15/24  8:10 AM   Specimen: BLOOD  Result Value Ref Range Status   Specimen Description BLOOD BLOOD RIGHT ARM  Final   Special Requests AEROBIC BOTTLE ONLY Blood Culture adequate volume  Final   Culture   Final    NO GROWTH < 24 HOURS Performed at [facility], [address] [place], [place] [address]    Report Status PENDING  Incomplete      Studies: DG CHEST PORT 1 VIEW Result Date: 01/16/2024 CLINICAL DATA:  100030 Leukocytosis 100030 EXAM: PORTABLE CHEST 1 VIEW COMPARISON:  Jan 13, 2024 September 09, 2023 FINDINGS: The cardiomediastinal silhouette is unchanged in contour.Atherosclerotic calcifications. Similar blunting of the LEFT costophrenic angle compared to prior. No pneumothorax. No acute pleuroparenchymal abnormality. IMPRESSION: Similar blunting of the LEFT costophrenic angle compared to  prior, favored to reflect a summation of pericardial fat as seen on recent CT Electronically Signed   By: Clancy Crimes M.D.   On: 01/16/2024 11:17    Scheduled Meds:  amLODipine   2.5 mg Oral Daily   aspirin EC  81 mg Oral Daily   carvedilol   12.5 mg Oral BID WC   vitamin B-12  1,000 mcg Oral Daily   enoxaparin  (LOVENOX ) injection  40 mg Subcutaneous Q24H   loratadine   10 mg Oral QPM   montelukast   10 mg Oral QHS   polyethylene glycol  17 g Oral Daily    Continuous Infusions:  azithromycin  500 mg (01/16/24 1549)   cefTRIAXone  (ROCEPHIN )  IV 2 g (01/15/24 1719)     LOS: 3 days     Veronica Gordon, MD [facility]  If 7PM-7AM, please contact night-coverage [email or web address] 01/16/2024, 3:51 PM

## 2024-01-16 NOTE — NC FL2 (Signed)
 [place]  MEDICAID FL2 LEVEL OF CARE FORM     IDENTIFICATION  Patient Name: Kristen Cox Birthdate: 02/12/1941 Sex: female Admission Date (Current Location): 01/13/2024  [facility] and [place] Number:  [employer] and Address:  [facility],  [address] [place] [address]      Provider Number: [id number]  Attending Physician Name and Address:  Veronica Gordon, MD  Relative Name and Phone Number:  Nicanor Barge    Current Level of Care:   Recommended Level of Care: Skilled Nursing Facility Prior Approval Number:    Date Approved/Denied:   PASRR Number:    Discharge Plan: SNF    Current Diagnoses: Patient Active Problem List   Diagnosis Date Noted   Rhabdomyolysis 01/13/2024   Lobar pneumonia (HCC) 01/13/2024   Pneumonia 01/13/2024   Neck pain 01/10/2024   Weight loss, abnormal 01/10/2024   Encounter for Medicare annual examination with abnormal findings 09/21/2023   Aortic valve calcification 04/04/2023   Atherosclerosis of aorta (HCC) 04/04/2023   Coronary atherosclerosis 04/04/2023   Abnormal laboratory test 02/17/2023   Muscle cramps 02/16/2023   Osteoarthritis of both knees 09/27/2022   Mediastinal mass 09/14/2022   Left anterior knee pain 09/14/2022   H/O falling 04/04/2022   Achilles tendon disorder 07/14/2021   Arthritis of both knees 01/20/2021   Palpitations 07/10/2020   Overweight (BMI 25.0-29.9) 04/10/2020   Vitamin D  deficiency 12/19/2019   Nocturnal leg cramps 06/09/2019   Myositis of multiple sites 09/12/2018   Left leg pain 11/08/2017   Shoulder pain, right 11/06/2017   Hyperparathyroidism (HCC) 04/21/2017   History of colonic polyps 05/06/2016   Back pain 03/24/2015   Hypercalcemia 11/19/2013   Prediabetes 04/29/2010   Hyperlipemia 02/13/2010   Gout 01/27/2010   Essential hypertension 01/27/2010   Allergic rhinitis 01/27/2010    Orientation RESPIRATION BLADDER Height & Weight     Self, Time,  Situation  Normal Continent Weight: 169 lb 5 oz (76.8 kg) Height:  5\' 2"  (157.5 cm)  BEHAVIORAL SYMPTOMS/MOOD NEUROLOGICAL BOWEL NUTRITION STATUS      Continent Diet (Regular)  AMBULATORY STATUS COMMUNICATION OF NEEDS Skin   Limited Assist Verbally Normal                       Personal Care Assistance Level of Assistance  Bathing, Feeding, Dressing Bathing Assistance: Limited assistance Feeding assistance: Limited assistance Dressing Assistance: Limited assistance     Functional Limitations Info  Sight, Hearing, Speech Sight Info: Adequate Hearing Info: Adequate Speech Info: Adequate    SPECIAL CARE FACTORS FREQUENCY  PT (By licensed PT), OT (By licensed OT)     PT Frequency: 5 times  weekly OT Frequency: 5  times weekly            Contractures Contractures Info: Not present    Additional Factors Info  Code Status Code Status Info: Full             Current Medications (01/16/2024):  This is the current hospital active medication list Current Facility-Administered Medications  Medication Dose Route Frequency Provider Last Rate Last Admin   acetaminophen  (TYLENOL ) tablet 650 mg  650 mg Oral Q8H PRN Elgergawy, Dawood S, MD   650 mg at 01/15/24 1138   albuterol (PROVENTIL) (2.5 MG/3ML) 0.083% nebulizer solution 2.5 mg  2.5 mg Nebulization Q2H PRN Elgergawy, Dawood S, MD       amLODipine  (NORVASC ) tablet 2.5 mg  2.5 mg Oral Daily Elgergawy, Dawood S,  MD   2.5 mg at 01/16/24 0841   aspirin EC tablet 81 mg  81 mg Oral Daily Elgergawy, Dawood S, MD   81 mg at 01/16/24 0841   azithromycin  (ZITHROMAX ) 500 mg in sodium chloride  0.9 % 250 mL IVPB  500 mg Intravenous Q24H Elgergawy, Dawood S, MD 250 mL/hr at 01/15/24 1834 500 mg at 01/15/24 1834   carvedilol  (COREG ) tablet 12.5 mg  12.5 mg Oral BID WC Elgergawy, Dawood S, MD   12.5 mg at 01/16/24 0841   cefTRIAXone  (ROCEPHIN ) 2 g in sodium chloride  0.9 % 100 mL IVPB  2 g Intravenous Q24H Elgergawy, Dawood S, MD 200 mL/hr  at 01/15/24 1719 2 g at 01/15/24 1719   cyanocobalamin (VITAMIN B12) tablet 1,000 mcg  1,000 mcg Oral Daily Ezenduka, Nkeiruka J, MD   1,000 mcg at 01/16/24 8119   enoxaparin  (LOVENOX ) injection 40 mg  40 mg Subcutaneous Q24H Ezenduka, Nkeiruka J, MD   40 mg at 01/16/24 0840   hydrALAZINE (APRESOLINE) injection 5 mg  5 mg Intravenous Q4H PRN Elgergawy, Dawood S, MD       HYDROcodone -acetaminophen  (NORCO/VICODIN) 5-325 MG per tablet 1 tablet  1 tablet Oral Q6H PRN Ezenduka, Nkeiruka J, MD   1 tablet at 01/15/24 1719   loratadine  (CLARITIN ) tablet 10 mg  10 mg Oral QPM Hunt, Madison H, RPH   10 mg at 01/15/24 1719   montelukast  (SINGULAIR ) tablet 10 mg  10 mg Oral QHS Elgergawy, Dawood S, MD   10 mg at 01/15/24 2028   ondansetron  (ZOFRAN ) injection 4 mg  4 mg Intravenous Q8H PRN Ezenduka, Nkeiruka J, MD   4 mg at 01/15/24 1212   polyethylene glycol (MIRALAX / GLYCOLAX) packet 17 g  17 g Oral Daily Ezenduka, Nkeiruka J, MD   17 g at 01/16/24 1478     Discharge Medications: Please see discharge summary for a list of discharge medications.  Relevant Imaging Results:  Relevant Lab Results:   Additional Information [id number]  Grandville Lax, LCSWA

## 2024-01-17 ENCOUNTER — Inpatient Hospital Stay (HOSPITAL_COMMUNITY)

## 2024-01-17 DIAGNOSIS — J189 Pneumonia, unspecified organism: Secondary | ICD-10-CM | POA: Diagnosis not present

## 2024-01-17 LAB — CBC
HCT: 26.4 % — ABNORMAL LOW (ref 36.0–46.0)
Hemoglobin: 8.9 g/dL — ABNORMAL LOW (ref 12.0–15.0)
MCH: 36.8 pg — ABNORMAL HIGH (ref 26.0–34.0)
MCHC: 33.7 g/dL (ref 30.0–36.0)
MCV: 109.1 fL — ABNORMAL HIGH (ref 80.0–100.0)
Platelets: 346 10*3/uL (ref 150–400)
RBC: 2.42 MIL/uL — ABNORMAL LOW (ref 3.87–5.11)
RDW: 15.3 % (ref 11.5–15.5)
WBC: 15.4 10*3/uL — ABNORMAL HIGH (ref 4.0–10.5)
nRBC: 0.2 % (ref 0.0–0.2)

## 2024-01-17 LAB — CK: Total CK: 109 U/L (ref 38–234)

## 2024-01-17 MED ORDER — SENNOSIDES-DOCUSATE SODIUM 8.6-50 MG PO TABS
1.0000 | ORAL_TABLET | Freq: Two times a day (BID) | ORAL | Status: DC
  Administered 2024-01-17 – 2024-01-19 (×5): 1 via ORAL
  Filled 2024-01-17 (×5): qty 1

## 2024-01-17 NOTE — Progress Notes (Signed)
 Physical Therapy Treatment Patient Details Name: Kristen Cox MRN: [id number] DOB: [birth year] Today's Date: 01/17/2024   History of Present Illness Kristen Cox  is a 83 y.o. female, past medical history of hypertension, hyperlipidemia, she presents to ED due to fall, weakness, patient report progressive weakness over the last week, deconditioning, frailty, difficulty ambulating and walking, but she denies any focal deficits, she had 2 falls this morning, she denies loss of consciousness, head trauma, or any anticoagulation use, no chest pain, no shortness of breath, she does report cough and congestion over the last few days as well.  - NAD chest x-ray significant for left lung base pneumonia with atelectasis, and white blood cell was elevated at 13.6, total CK was elevated at 2007, troponins were elevated 54> 48, but her EKG is nonacute and she denies any chest pain or shortness of breath, [facility] consulted to admit    PT Comments  Patient had difficulty sitting up at bedside mostly due to right knee pain and right wrist discomfort/pain at site of IV insertion, required Mod/max assist for scooting to EOB, tolerated exercises to BLE with fair/good return for extending knees against gravity and able to use RW for transferring to chair with Max assist. Patient tolerated sitting up in chair after therapy with niece present - nursing staff notified. Patient will benefit from continued skilled physical therapy in hospital and recommended venue below to increase strength, balance, endurance for safe ADLs and gait.      If plan is discharge home, recommend the following: A lot of help with bathing/dressing/bathroom;A lot of help with walking and/or transfers;Help with stairs or ramp for entrance;Assistance with cooking/housework   Can travel by private vehicle     No  Equipment Recommendations  None recommended by PT    Recommendations for Other Services       Precautions / Restrictions  Precautions Precautions: Fall Restrictions Weight Bearing Restrictions Per Provider Order: No     Mobility  Bed Mobility Overal bed mobility: Needs Assistance Bed Mobility: Supine to Sit     Supine to sit: Max assist, HOB elevated     General bed mobility comments: Increased time, labored movement with HOB partially raised    Transfers Overall transfer level: Needs assistance Equipment used: Rolling walker (2 wheels) Transfers: Sit to/from Stand, Bed to chair/wheelchair/BSC Sit to Stand: Max assist   Step pivot transfers: Max assist       General transfer comment: very unsteady on feet due to BLE weakness, able to stand using RW during transfer to chair    Ambulation/Gait Ambulation/Gait assistance: Max assist Gait Distance (Feet): 2 Feet Assistive device: Rolling walker (2 wheels) Gait Pattern/deviations: Decreased step length - right, Decreased step length - left, Decreased stride length, Knees buckling, Shuffle Gait velocity: slow     General Gait Details: limited to a couple of unsteady labored side steps with buckling of knees due to weakness   Stairs             Wheelchair Mobility     Tilt Bed    Modified Rankin (Stroke Patients Only)       Balance Overall balance assessment: Needs assistance Sitting-balance support: Feet supported, No upper extremity supported Sitting balance-Leahy Scale: Fair Sitting balance - Comments: seated at EOB   Standing balance support: Reliant on assistive device for balance, During functional activity, Bilateral upper extremity supported Standing balance-Leahy Scale: Poor Standing balance comment: using RW  Communication Communication Communication: No apparent difficulties  Cognition Arousal: Alert Behavior During Therapy: WFL for tasks assessed/performed   PT - Cognitive impairments: No apparent impairments                         Following commands:  Intact      Cueing Cueing Techniques: Verbal cues, Tactile cues  Exercises General Exercises - Lower Extremity Long Arc Quad: Seated, AROM, Strengthening, Both, 10 reps Hip Flexion/Marching: Seated, AROM, Strengthening, Both, 10 reps Toe Raises: Seated, AROM, Strengthening, Both, 10 reps Heel Raises: Seated, AROM, Strengthening, Both, 10 reps    General Comments        Pertinent Vitals/Pain Pain Assessment Pain Assessment: Faces Faces Pain Scale: Hurts even more Pain Location: bilaterl knees, right knee worse, left wrist at IV insertion Pain Descriptors / Indicators: Grimacing, Guarding, Moaning, Discomfort, Sharp Pain Intervention(s): Limited activity within patient's tolerance, Monitored during session, Repositioned    Home Living                          Prior Function            PT Goals (current goals can now be found in the care plan section) Acute Rehab PT Goals Patient Stated Goal: return home after rehab PT Goal Formulation: With patient/family Time For Goal Achievement: 01/29/24 Potential to Achieve Goals: Good Progress towards PT goals: Progressing toward goals    Frequency    Min 3X/week      PT Plan      Co-evaluation              AM-PAC PT "6 Clicks" Mobility   Outcome Measure  Help needed turning from your back to your side while in a flat bed without using bedrails?: A Lot Help needed moving from lying on your back to sitting on the side of a flat bed without using bedrails?: A Lot Help needed moving to and from a bed to a chair (including a wheelchair)?: A Lot Help needed standing up from a chair using your arms (e.g., wheelchair or bedside chair)?: A Lot Help needed to walk in hospital room?: A Lot Help needed climbing 3-5 steps with a railing? : Total 6 Click Score: 11    End of Session   Activity Tolerance: Patient tolerated treatment well;Patient limited by fatigue Patient left: in chair;with call bell/phone within  reach;with family/visitor present Nurse Communication: Mobility status PT Visit Diagnosis: Unsteadiness on feet (R26.81);Other abnormalities of gait and mobility (R26.89);Muscle weakness (generalized) (M62.81)     Time: 4098-1191 PT Time Calculation (min) (ACUTE ONLY): 29 min  Charges:    $Therapeutic Exercise: 8-22 mins $Therapeutic Activity: 8-22 mins PT General Charges $$ ACUTE PT VISIT: 1 Visit                     3:13 PM, 01/17/24 Walton Guppy, MPT Physical Therapist with [facility] [phone] office [phone] mobile phone

## 2024-01-17 NOTE — Progress Notes (Addendum)
 PROGRESS NOTE  Kristen Cox GEX:[id number] DOB: [birth year] DOA: 01/13/2024 PCP: Towanda Fret, MD  HPI/Recap of past 24 hours: Kristen Cox is a 83 y.o. female, PMH of HTN, HLD presented to ED due to likely mechanical fall, with progressive weakness over the last week. Denies any LOC, head trauma.  In the ED, vital signs fairly stable.  Labs with mildly elevated AST, ALT, elevated CK, elevated troponin 54-->48, leukocytosis, B12 deficiency. Chest x-ray significant for left lung base pneumonia with atelectasis. EKG is non-acute.  Patient admitted for further management.    Today, patient still complaining of significant right knee pain, unable to ambulate due to pain.  Noted limited range of motion, knee feels warm and painful to touch.  Otherwise denies any other new complaints.     Assessment/Plan: Principal Problem:   Pneumonia Active Problems:   Hyperlipemia   Essential hypertension   H/O falling   Rhabdomyolysis   Lobar pneumonia (HCC)   Likely CAP Afebrile with persistent/uptrending leukocytosis BC x 2 NGTD (collected on 5/25, after antibiotics have been initiated) Urine strep pneumo, Legionella negative Procalcitonin negative CXR significant for left lung base opacity Continue ceftriaxone , azithromycin  x 5 days Incentive spirometer and flutter valve  Bilateral lower extremity knee arthritis Right knee pain, large right knee effusion Likely exacerbated after fall, currently unable to ambulate due to significant right knee pain DG knee x-ray bilateral showed advanced arthritis, no fracture Right knee x-ray showed large joint effusion Discussed with orthopedics Dr. Phyllis Breeze, on 5/27 stated that there is no orthopedics [facility] this [facility], recommend IR for possible arthrocentesis Plan for possible IR arthrocentesis (please call IR 5/28, consult placed) Pain management, PT/OT   Fall Generalized weakness PT/OT-recommending SNF Fall precautions    Rhabdomyolysis  CK 2007-->1503-->846-->285--> 109 Likely 2/2 above S/p IVF  Mild elevated LFTs AST 74, ALT 61, likely 2/2 above Resolved  Mild elevated troponin Flat trend, denies any chest pain, shortness of breath EKG with no acute ST changes Likely 2/2 rhabdo  Hypokalemia/hypomagnesemia Replace as needed  Macrocytic anemia Vitamin B12 deficiency B12--> 51 Supplement B12  Hypertension BP stable Continue amlodipine , Coreg    Hyperlipidemia Restart statin and Zetia  upon DC  Obesity type I Lifestyle modification advised       Estimated body mass index is 30.97 kg/m as calculated from the following:   Height as of this encounter: 5\' 2"  (1.575 m).   Weight as of this encounter: 76.8 kg.     Code Status: Full  Family Communication: Son at bedside  Disposition Plan: Status is: Inpatient Remains inpatient appropriate because: Level of care      Consultants: None  Procedures: None  Antimicrobials: Ceftriaxone  Azithromycin   DVT prophylaxis: Lovenox    Objective: Vitals:   01/16/24 0527 01/16/24 1330 01/16/24 2053 01/17/24 0357  BP: 133/75 (!) 136/59 (!) 111/54 (!) 112/54  Pulse: 92 97 97 82  Resp: 16 18 17 17   Temp: 100 F (37.8 C) 99.2 F (37.3 C) 99.9 F (37.7 C) 98.6 F (37 C)  TempSrc: Oral  Oral Oral  SpO2: 95% 97% 95% 97%  Weight:      Height:        Intake/Output Summary (Last 24 hours) at 01/17/2024 0926 Last data filed at 01/17/2024 0357 Gross per 24 hour  Intake 240 ml  Output 800 ml  Net -560 ml   Filed Weights   01/13/24 0926 01/13/24 1930  Weight: 64 kg 76.8 kg    Exam: General: NAD  Cardiovascular: S1, S2 present Respiratory: CTAB Abdomen: Soft, nontender, nondistended, bowel sounds present Musculoskeletal: No bilateral pedal edema noted, right knee tender to touch, swelling noted, limited range of motion, unable to ambulate due to infection pain Skin: Normal Psychiatry: Normal mood     Data  Reviewed: CBC: Recent Labs  Lab 01/10/24 0948 01/13/24 1041 01/14/24 0414 01/15/24 0303 01/16/24 0652 01/17/24 0451  WBC 8.9 13.6* 11.6* 14.6* 14.9* 15.4*  NEUTROABS 5.2 9.6*  --   --   --   --   HGB 10.7* 10.6* 9.4* 11.2* 10.4* 8.9*  HCT 32.3* 31.1* 28.3* 33.7* 31.8* 26.4*  MCV 112* 109.5* 110.5* 113.9* 112.8* 109.1*  PLT 608* 481* 429* 369 309 346   Basic Metabolic Panel: Recent Labs  Lab 01/10/24 0948 01/13/24 1132 01/14/24 0414 01/14/24 0720 01/15/24 0303 01/16/24 0652  NA 139 135 138  --  133* 132*  K 5.0 3.5 3.3*  --  4.7 4.3  CL 103 104 109  --  106 97*  CO2 22 24 26   --  23 21*  GLUCOSE 91 106* 98  --  114* 96  BUN 11 17 18   --  13 15  CREATININE 0.71 0.66 0.56  --  0.56 0.56  CALCIUM  10.3 9.7 8.9  --  8.9 8.8*  MG  --   --   --  1.6* 1.8  --    GFR: Estimated Creatinine Clearance: 52 mL/min (by C-G formula based on SCr of 0.56 mg/dL). Liver Function Tests: Recent Labs  Lab 01/10/24 0948 01/13/24 1132 01/15/24 0303  AST 22 74* 41  ALT 16 61* 39  ALKPHOS 100 71 70  BILITOT 0.4 0.6 1.0  PROT 6.8 7.1 6.1*  ALBUMIN 3.9 3.4* 2.9*   No results for input(s): "LIPASE", "AMYLASE" in the last 168 hours. No results for input(s): "AMMONIA" in the last 168 hours. Coagulation Profile: No results for input(s): "INR", "PROTIME" in the last 168 hours. Cardiac Enzymes: Recent Labs  Lab 01/13/24 1132 01/14/24 0414 01/15/24 0303 01/16/24 0652 01/17/24 0451  CKTOTAL 2,007* 1,503* 846* 285* 109   BNP (last 3 results) No results for input(s): "PROBNP" in the last 8760 hours. HbA1C: No results for input(s): "HGBA1C" in the last 72 hours. CBG: No results for input(s): "GLUCAP" in the last 168 hours. Lipid Profile: No results for input(s): "CHOL", "HDL", "LDLCALC", "TRIG", "CHOLHDL", "LDLDIRECT" in the last 72 hours. Thyroid  Function Tests: No results for input(s): "TSH", "T4TOTAL", "FREET4", "T3FREE", "THYROIDAB" in the last 72 hours. Anemia Panel: No  results for input(s): "VITAMINB12", "FOLATE", "FERRITIN", "TIBC", "IRON", "RETICCTPCT" in the last 72 hours.  Urine analysis:    Component Value Date/Time   COLORURINE STRAW (A) 01/13/2024 1139   APPEARANCEUR CLEAR 01/13/2024 1139   LABSPEC 1.008 01/13/2024 1139   PHURINE 6.0 01/13/2024 1139   GLUCOSEU NEGATIVE 01/13/2024 1139   HGBUR NEGATIVE 01/13/2024 1139   BILIRUBINUR NEGATIVE 01/13/2024 1139   KETONESUR NEGATIVE 01/13/2024 1139   PROTEINUR NEGATIVE 01/13/2024 1139   NITRITE NEGATIVE 01/13/2024 1139   LEUKOCYTESUR TRACE (A) 01/13/2024 1139   Sepsis Labs: @LABRCNTIP (procalcitonin:4,lacticidven:4)  ) Recent Results (from the past 240 hours)  Resp panel by RT-PCR (RSV, Flu A&B, Covid) Anterior Nasal Swab     Status: None   Collection Time: 01/14/24  7:18 AM   Specimen: Anterior Nasal Swab  Result Value Ref Range Status   SARS Coronavirus 2 by RT PCR NEGATIVE NEGATIVE Final    Comment: (NOTE) SARS-CoV-2 target nucleic acids are NOT DETECTED.  The SARS-CoV-2 RNA is generally detectable in upper respiratory specimens during the acute phase of infection. The lowest concentration of SARS-CoV-2 viral copies this assay can detect is 138 copies/mL. A negative result does not preclude SARS-Cov-2 infection and should not be used as the sole basis for treatment or other patient management decisions. A negative result may occur with  improper specimen collection/handling, submission of specimen other than nasopharyngeal swab, presence of viral mutation(s) within the areas targeted by this assay, and inadequate number of viral copies(<138 copies/mL). A negative result must be combined with clinical observations, patient history, and epidemiological information. The expected result is Negative.  Fact Sheet for Patients:  BloggerCourse.com  Fact Sheet for Healthcare Providers:  SeriousBroker.it  This test is no t yet approved or  cleared by the [place]  FDA and  has been authorized for detection and/or diagnosis of SARS-CoV-2 by FDA under an Emergency Use Authorization (EUA). This EUA will remain  in effect (meaning this test can be used) for the duration of the COVID-19 declaration under Section 564(b)(1) of the Act, 21 U.S.C.section 360bbb-3(b)(1), unless the authorization is terminated  or revoked sooner.       Influenza A by PCR NEGATIVE NEGATIVE Final   Influenza B by PCR NEGATIVE NEGATIVE Final    Comment: (NOTE) The Xpert Xpress SARS-CoV-2/FLU/RSV plus assay is intended as an aid in the diagnosis of influenza from Nasopharyngeal swab specimens and should not be used as a sole basis for treatment. Nasal washings and aspirates are unacceptable for Xpert Xpress SARS-CoV-2/FLU/RSV testing.  Fact Sheet for Patients: BloggerCourse.com  Fact Sheet for Healthcare Providers: SeriousBroker.it  This test is not yet approved or cleared by the [place]  FDA and has been authorized for detection and/or diagnosis of SARS-CoV-2 by FDA under an Emergency Use Authorization (EUA). This EUA will remain in effect (meaning this test can be used) for the duration of the COVID-19 declaration under Section 564(b)(1) of the Act, 21 U.S.C. section 360bbb-3(b)(1), unless the authorization is terminated or revoked.     Resp Syncytial Virus by PCR NEGATIVE NEGATIVE Final    Comment: (NOTE) Fact Sheet for Patients: BloggerCourse.com  Fact Sheet for Healthcare Providers: SeriousBroker.it  This test is not yet approved or cleared by the [place]  FDA and has been authorized for detection and/or diagnosis of SARS-CoV-2 by FDA under an Emergency Use Authorization (EUA). This EUA will remain in effect (meaning this test can be used) for the duration of the COVID-19 declaration under Section 564(b)(1) of the Act, 21  U.S.C. section 360bbb-3(b)(1), unless the authorization is terminated or revoked.  Performed at [facility], [address], [place], [place] [address]   Culture, blood (Routine X 2) w Reflex to ID Panel     Status: None (Preliminary result)   Collection Time: 01/15/24  8:10 AM   Specimen: BLOOD  Result Value Ref Range Status   Specimen Description BLOOD RIGHT ANTECUBITAL  Final   Special Requests AEROBIC BOTTLE ONLY Blood Culture adequate volume  Final   Culture   Final    NO GROWTH 2 DAYS Performed at [facility], [address], [place], [place] [address]    Report Status PENDING  Incomplete  Culture, blood (Routine X 2) w Reflex to ID Panel     Status: None (Preliminary result)   Collection Time: 01/15/24  8:10 AM   Specimen: BLOOD  Result Value Ref Range Status   Specimen Description BLOOD BLOOD RIGHT ARM  Final   Special Requests AEROBIC  BOTTLE ONLY Blood Culture adequate volume  Final   Culture   Final    NO GROWTH 2 DAYS Performed at [facility], [address] [place], [place] [address]    Report Status PENDING  Incomplete      Studies: No results found.   Scheduled Meds:  amLODipine   2.5 mg Oral Daily   aspirin EC  81 mg Oral Daily   carvedilol   12.5 mg Oral BID WC   vitamin B-12  1,000 mcg Oral Daily   enoxaparin  (LOVENOX ) injection  40 mg Subcutaneous Q24H   loratadine   10 mg Oral QPM   montelukast   10 mg Oral QHS   polyethylene glycol  17 g Oral Daily   senna-docusate  1 tablet Oral BID    Continuous Infusions:  azithromycin  500 mg (01/16/24 1549)   cefTRIAXone  (ROCEPHIN )  IV 2 g (01/16/24 2027)     LOS: 4 days     Veronica Gordon, MD [facility]  If 7PM-7AM, please contact night-coverage [email or web address] 01/17/2024, 9:26 AM

## 2024-01-17 NOTE — Plan of Care (Signed)
   Problem: Education: Goal: Knowledge of General Education information will improve Description: Including pain rating scale, medication(s)/side effects and non-pharmacologic comfort measures Outcome: Progressing   Problem: Clinical Measurements: Goal: Ability to maintain clinical measurements within normal limits will improve Outcome: Progressing Goal: Cardiovascular complication will be avoided Outcome: Progressing    Problem: Activity: Goal: Risk for activity intolerance will decrease Outcome: Progressing   Problem: Nutrition: Goal: Adequate nutrition will be maintained Outcome: Progressing   Problem: Coping: Goal: Level of anxiety will decrease Outcome: Progressing

## 2024-01-17 NOTE — Progress Notes (Signed)
 Mobility Specialist Progress Note:    01/17/24 1355  Mobility  Activity Transferred to/from [facility]  Level of Assistance +2 (takes two people)  Location manager Ambulated (ft) 1 ft  Range of Motion/Exercises Active;All extremities  Activity Response Tolerated well  Mobility Referral No  Mobility visit 1 Mobility  Mobility Specialist Start Time (ACUTE ONLY) 1343  Mobility Specialist Stop Time (ACUTE ONLY) 1355  Mobility Specialist Time Calculation (min) (ACUTE ONLY) 12 min   Pt received requesting assistance to [facility] Required MaxA +3 to stand and transfer with RW. Tolerated well, c/o LUE pain from "shoulder to fingertips". Left pt with nursing staff, all needs met.  Glinda Lapping Mobility Specialist Please contact via Special educational needs teacher or  Rehab office at [phone]

## 2024-01-17 NOTE — TOC Progression Note (Signed)
 Transition of Care [facility]) - Progression Note    Patient Details  Name: Kristen Cox MRN: [id number] Date of Birth: 04-30-1941  Transition of Care [facility]) CM/SW Contact  Ander [place], [place] Phone Number: 01/17/2024, 9:35 AM  Clinical Narrative: Reviewed bed offers with pt who chooses [facility]. Will update auth.         Barriers to Discharge: Continued Medical Work up  Expected Discharge Plan and Services                                               Social Determinants of Health (SDOH) Interventions SDOH Screenings   Food Insecurity: No Food Insecurity (01/13/2024)  Housing: Low Risk  (01/13/2024)  Transportation Needs: No Transportation Needs (01/13/2024)  Utilities: Not At Risk (01/13/2024)  Alcohol Screen: Low Risk  (01/20/2022)  Depression (PHQ2-9): Medium Risk (01/10/2024)  Financial Resource Strain: Low Risk  (07/13/2023)  Physical Activity: Sufficiently Active (07/13/2023)  Social Connections: Socially Isolated (01/13/2024)  Stress: No Stress Concern Present (07/13/2023)  Tobacco Use: Medium Risk (01/13/2024)  Health Literacy: Adequate Health Literacy (07/13/2023)    Readmission Risk Interventions    01/13/2024    8:40 PM  Readmission Risk Prevention Plan  Post Dischage Appt Complete  Medication Screening Complete  Transportation Screening Complete

## 2024-01-18 ENCOUNTER — Inpatient Hospital Stay (HOSPITAL_COMMUNITY)

## 2024-01-18 DIAGNOSIS — J189 Pneumonia, unspecified organism: Secondary | ICD-10-CM | POA: Diagnosis not present

## 2024-01-18 LAB — CBC
HCT: 25.9 % — ABNORMAL LOW (ref 36.0–46.0)
Hemoglobin: 8.6 g/dL — ABNORMAL LOW (ref 12.0–15.0)
MCH: 36.6 pg — ABNORMAL HIGH (ref 26.0–34.0)
MCHC: 33.2 g/dL (ref 30.0–36.0)
MCV: 110.2 fL — ABNORMAL HIGH (ref 80.0–100.0)
Platelets: 357 10*3/uL (ref 150–400)
RBC: 2.35 MIL/uL — ABNORMAL LOW (ref 3.87–5.11)
RDW: 15.7 % — ABNORMAL HIGH (ref 11.5–15.5)
WBC: 14.5 10*3/uL — ABNORMAL HIGH (ref 4.0–10.5)
nRBC: 0.3 % — ABNORMAL HIGH (ref 0.0–0.2)

## 2024-01-18 LAB — BASIC METABOLIC PANEL WITH GFR
Anion gap: 4 — ABNORMAL LOW (ref 5–15)
BUN: 26 mg/dL — ABNORMAL HIGH (ref 8–23)
CO2: 25 mmol/L (ref 22–32)
Calcium: 9.3 mg/dL (ref 8.9–10.3)
Chloride: 106 mmol/L (ref 98–111)
Creatinine, Ser: 0.56 mg/dL (ref 0.44–1.00)
GFR, Estimated: >60 mL/min (ref >60)
Glucose, Bld: 120 mg/dL — ABNORMAL HIGH (ref 70–99)
Potassium: 4.3 mmol/L (ref 3.5–5.1)
Sodium: 135 mmol/L (ref 135–145)

## 2024-01-18 LAB — SYNOVIAL CELL COUNT + DIFF, W/ CRYSTALS
Lymphocytes-Synovial Fld: 4 % (ref 0–20)
Monocyte-Macrophage-Synovial Fluid: 4 % — ABNORMAL LOW (ref 50–90)
Neutrophil, Synovial: 92 % — ABNORMAL HIGH (ref 0–25)
WBC, Synovial: 4020 /mm3 — ABNORMAL HIGH (ref 0–200)

## 2024-01-18 MED ORDER — LIDOCAINE HCL (PF) 1 % IJ SOLN
5.0000 mL | Freq: Once | INTRAMUSCULAR | Status: AC
  Administered 2024-01-18: 5 mL via INTRADERMAL

## 2024-01-18 MED ORDER — LIDOCAINE HCL (PF) 1 % IJ SOLN
INTRAMUSCULAR | Status: AC
  Filled 2024-01-18: qty 5

## 2024-01-18 NOTE — Plan of Care (Signed)
  Problem: Education: Goal: Knowledge of General Education information will improve Description: Including pain rating scale, medication(s)/side effects and non-pharmacologic comfort measures Outcome: Progressing   Problem: Clinical Measurements: Goal: Ability to maintain clinical measurements within normal limits will improve Outcome: Progressing Goal: Respiratory complications will improve Outcome: Progressing   Problem: Activity: Goal: Risk for activity intolerance will decrease Outcome: Progressing   Problem: Nutrition: Goal: Adequate nutrition will be maintained Outcome: Progressing   Problem: Coping: Goal: Level of anxiety will decrease Outcome: Progressing

## 2024-01-18 NOTE — TOC Progression Note (Signed)
 Transition of Care [facility]) - Progression Note    Patient Details  Name: Kristen Cox MRN: [id number] Date of Birth: 1940/12/08  Transition [facility]) CM/SW Contact  Ander [place], [place] Phone Number: 01/18/2024, 10:56 AM  Clinical Narrative: SNF [facility] was denied as pt was not medically stable. Will restart auth in anticipation of d/c to [facility] tomorrow. Kerri at [facility] updated.         Barriers to Discharge: Continued Medical Work up  Expected Discharge Plan and Services                                               Social Determinants of Health (SDOH) Interventions SDOH Screenings   Food Insecurity: No Food Insecurity (01/13/2024)  Housing: Low Risk  (01/13/2024)  Transportation Needs: No Transportation Needs (01/13/2024)  Utilities: Not At Risk (01/13/2024)  Alcohol Screen: Low Risk  (01/20/2022)  Depression (PHQ2-9): Medium Risk (01/10/2024)  Financial Resource Strain: Low Risk  (07/13/2023)  Physical Activity: Sufficiently Active (07/13/2023)  Social Connections: Socially Isolated (01/13/2024)  Stress: No Stress Concern Present (07/13/2023)  Tobacco Use: Medium Risk (01/13/2024)  Health Literacy: Adequate Health Literacy (07/13/2023)    Readmission Risk Interventions    01/13/2024    8:40 PM  Readmission Risk Prevention Plan  Post Dischage Appt Complete  Medication Screening Complete  Transportation Screening Complete

## 2024-01-18 NOTE — Progress Notes (Signed)
 PROGRESS NOTE    Kristen Cox  ZOX:[id number] DOB: 25-Aug-1940 DOA: 01/13/2024 PCP: Towanda Fret, MD   Brief Narrative:    Kristen Cox is a 83 y.o. female, PMH of HTN, HLD presented to ED due to likely mechanical fall, with progressive weakness over the last week. Denies any LOC, head trauma. In the ED, vital signs fairly stable. Labs with mildly elevated AST, ALT, elevated CK, elevated troponin 54-->48, leukocytosis, B12 deficiency. Chest x-ray significant for left lung base pneumonia with atelectasis. EKG is non-acute.  She remains on treatment with IV antibiotics and is now also noted to have right knee swelling/effusion and has undergone arthrocentesis 5/28.  PT recommending SNF placement.  Assessment & Plan:   Principal Problem:   Pneumonia Active Problems:   Hyperlipemia   Essential hypertension   H/O falling   Rhabdomyolysis   Lobar pneumonia (HCC)  Assessment and Plan:   Likely CAP Afebrile with persistent/uptrending leukocytosis BC x 2 NGTD (collected on 5/25, after antibiotics have been initiated) Urine strep pneumo, Legionella negative Procalcitonin negative CXR significant for left lung base opacity Continue ceftriaxone , azithromycin  x 5 days Incentive spirometer and flutter valve   Bilateral lower extremity knee arthritis Right knee pain, large right knee effusion Likely exacerbated after fall, currently unable to ambulate due to significant right knee pain DG knee x-ray bilateral showed advanced arthritis, no fracture Right knee x-ray showed large joint effusion Discussed with orthopedics Dr. Phyllis Breeze, on 5/27 stated that there is no orthopedics coverage this week at [facility], recommend IR for possible arthrocentesis Patient has undergone right knee arthrocentesis 5/28 with further results pending Pain management, PT/OT   Fall Generalized weakness PT/OT-recommending SNF Fall precautions   Rhabdomyolysis  CK 2007-->1503-->846-->285-->  109 Likely 2/2 above S/p IVF   Mild elevated LFTs AST 74, ALT 61, likely 2/2 above Resolved   Mild elevated troponin Flat trend, denies any chest pain, shortness of breath EKG with no acute ST changes Likely 2/2 rhabdo   Hypokalemia/hypomagnesemia Replace as needed   Macrocytic anemia Vitamin B12 deficiency B12--> 51 Supplement B12   Hypertension BP stable Continue amlodipine , Coreg    Hyperlipidemia Restart statin and Zetia  upon DC   Obesity type I Lifestyle modification advised   DVT prophylaxis: Lovenox  Code Status: Full Family Communication: None at bedside Disposition Plan:  Status is: Inpatient Remains inpatient appropriate because: Need for IV medications.  Consultants:  IR  Procedures:  Right knee arthrocentesis 5/28  Antimicrobials:  Anti-infectives (From admission, onward)    Start     Dose/Rate Route Frequency Ordered Stop   01/14/24 1600  cefTRIAXone  (ROCEPHIN ) 2 g in sodium chloride  0.9 % 100 mL IVPB        2 g 200 mL/hr over 30 Minutes Intravenous Every 24 hours 01/13/24 1754 01/19/24 1559   01/14/24 1600  azithromycin  (ZITHROMAX ) 500 mg in sodium chloride  0.9 % 250 mL IVPB        500 mg 250 mL/hr over 60 Minutes Intravenous Every 24 hours 01/13/24 1754 01/19/24 1559   01/13/24 1800  cefTRIAXone  (ROCEPHIN ) 1 g in sodium chloride  0.9 % 100 mL IVPB        1 g 200 mL/hr over 30 Minutes Intravenous  Once 01/13/24 1754 01/13/24 2143   01/13/24 1515  cefTRIAXone  (ROCEPHIN ) 1 g in sodium chloride  0.9 % 100 mL IVPB        1 g 200 mL/hr over 30 Minutes Intravenous  Once 01/13/24 1505 01/13/24 1556   01/13/24 1515  azithromycin  (  ZITHROMAX ) 500 mg in sodium chloride  0.9 % 250 mL IVPB        500 mg 250 mL/hr over 60 Minutes Intravenous  Once 01/13/24 1505 01/13/24 1741       Subjective: Patient seen and evaluated today with no new acute complaints or concerns. No acute concerns or events noted overnight.  She continues to have right knee  swelling.  Objective: Vitals:   01/17/24 1210 01/17/24 2041 01/18/24 0424 01/18/24 1427  BP: 138/70 (!) 119/54 129/64 (!) 135/58  Pulse: (!) 102 96 87 86  Resp:  17 17 18   Temp: (!) 100.4 F (38 C) 99 F (37.2 C) 98.9 F (37.2 C) 98.6 F (37 C)  TempSrc: Oral Oral Oral Oral  SpO2: 93% 92% 93% 97%  Weight:      Height:        Intake/Output Summary (Last 24 hours) at 01/18/2024 1512 Last data filed at 01/18/2024 1300 Gross per 24 hour  Intake 0 ml  Output 200 ml  Net -200 ml   Filed Weights   01/13/24 0926 01/13/24 1930  Weight: 64 kg 76.8 kg    Examination:  General exam: Appears calm and comfortable  Respiratory system: Clear to auscultation. Respiratory effort normal. Cardiovascular system: S1 & S2 heard, RRR.  Gastrointestinal system: Abdomen is soft Central nervous system: Alert and awake Extremities: Right knee swelling. Skin: No significant lesions noted Psychiatry: Flat affect.    Data Reviewed: I have personally reviewed following labs and imaging studies  CBC: Recent Labs  Lab 01/13/24 1041 01/14/24 0414 01/15/24 0303 01/16/24 0652 01/17/24 0451 01/18/24 0438  WBC 13.6* 11.6* 14.6* 14.9* 15.4* 14.5*  NEUTROABS 9.6*  --   --   --   --   --   HGB 10.6* 9.4* 11.2* 10.4* 8.9* 8.6*  HCT 31.1* 28.3* 33.7* 31.8* 26.4* 25.9*  MCV 109.5* 110.5* 113.9* 112.8* 109.1* 110.2*  PLT 481* 429* 369 309 346 357   Basic Metabolic Panel: Recent Labs  Lab 01/13/24 1132 01/14/24 0414 01/14/24 0720 01/15/24 0303 01/16/24 0652 01/18/24 0438  NA 135 138  --  133* 132* 135  K 3.5 3.3*  --  4.7 4.3 4.3  CL 104 109  --  106 97* 106  CO2 24 26  --  23 21* 25  GLUCOSE 106* 98  --  114* 96 120*  BUN 17 18  --  13 15 26*  CREATININE 0.66 0.56  --  0.56 0.56 0.56  CALCIUM  9.7 8.9  --  8.9 8.8* 9.3  MG  --   --  1.6* 1.8  --   --    GFR: Estimated Creatinine Clearance: 52 mL/min (by C-G formula based on SCr of 0.56 mg/dL). Liver Function Tests: Recent Labs   Lab 01/13/24 1132 01/15/24 0303  AST 74* 41  ALT 61* 39  ALKPHOS 71 70  BILITOT 0.6 1.0  PROT 7.1 6.1*  ALBUMIN 3.4* 2.9*   No results for input(s): "LIPASE", "AMYLASE" in the last 168 hours. No results for input(s): "AMMONIA" in the last 168 hours. Coagulation Profile: No results for input(s): "INR", "PROTIME" in the last 168 hours. Cardiac Enzymes: Recent Labs  Lab 01/13/24 1132 01/14/24 0414 01/15/24 0303 01/16/24 0652 01/17/24 0451  CKTOTAL 2,007* 1,503* 846* 285* 109   BNP (last 3 results) No results for input(s): "PROBNP" in the last 8760 hours. HbA1C: No results for input(s): "HGBA1C" in the last 72 hours. CBG: No results for input(s): "GLUCAP" in the last 168  hours. Lipid Profile: No results for input(s): "CHOL", "HDL", "LDLCALC", "TRIG", "CHOLHDL", "LDLDIRECT" in the last 72 hours. Thyroid  Function Tests: No results for input(s): "TSH", "T4TOTAL", "FREET4", "T3FREE", "THYROIDAB" in the last 72 hours. Anemia Panel: No results for input(s): "VITAMINB12", "FOLATE", "FERRITIN", "TIBC", "IRON", "RETICCTPCT" in the last 72 hours. Sepsis Labs: Recent Labs  Lab 01/15/24 0810  PROCALCITON <0.10    Recent Results (from the past 240 hours)  Resp panel by RT-PCR (RSV, Flu A&B, Covid) Anterior Nasal Swab     Status: None   Collection Time: 01/14/24  7:18 AM   Specimen: Anterior Nasal Swab  Result Value Ref Range Status   SARS Coronavirus 2 by RT PCR NEGATIVE NEGATIVE Final    Comment: (NOTE) SARS-CoV-2 target nucleic acids are NOT DETECTED.  The SARS-CoV-2 RNA is generally detectable in upper respiratory specimens during the acute phase of infection. The lowest concentration of SARS-CoV-2 viral copies this assay can detect is 138 copies/mL. A negative result does not preclude SARS-Cov-2 infection and should not be used as the sole basis for treatment or other patient management decisions. A negative result may occur with  improper specimen collection/handling,  submission of specimen other than nasopharyngeal swab, presence of viral mutation(s) within the areas targeted by this assay, and inadequate number of viral copies(<138 copies/mL). A negative result must be combined with clinical observations, patient history, and epidemiological information. The expected result is Negative.  Fact Sheet for Patients:  BloggerCourse.com  Fact Sheet for Healthcare Providers:  SeriousBroker.it  This test is no t yet approved or cleared by the [place]  FDA and  has been authorized for detection and/or diagnosis of SARS-CoV-2 by FDA under an Emergency Use Authorization (EUA). This EUA will remain  in effect (meaning this test can be used) for the duration of the COVID-19 declaration under Section 564(b)(1) of the Act, 21 U.S.C.section 360bbb-3(b)(1), unless the authorization is terminated  or revoked sooner.       Influenza A by PCR NEGATIVE NEGATIVE Final   Influenza B by PCR NEGATIVE NEGATIVE Final    Comment: (NOTE) The Xpert Xpress SARS-CoV-2/FLU/RSV plus assay is intended as an aid in the diagnosis of influenza from Nasopharyngeal swab specimens and should not be used as a sole basis for treatment. Nasal washings and aspirates are unacceptable for Xpert Xpress SARS-CoV-2/FLU/RSV testing.  Fact Sheet for Patients: BloggerCourse.com  Fact Sheet for Healthcare Providers: SeriousBroker.it  This test is not yet approved or cleared by the [place]  FDA and has been authorized for detection and/or diagnosis of SARS-CoV-2 by FDA under an Emergency Use Authorization (EUA). This EUA will remain in effect (meaning this test can be used) for the duration of the COVID-19 declaration under Section 564(b)(1) of the Act, 21 U.S.C. section 360bbb-3(b)(1), unless the authorization is terminated or revoked.     Resp Syncytial Virus by PCR NEGATIVE  NEGATIVE Final    Comment: (NOTE) Fact Sheet for Patients: BloggerCourse.com  Fact Sheet for Healthcare Providers: SeriousBroker.it  This test is not yet approved or cleared by the [place]  FDA and has been authorized for detection and/or diagnosis of SARS-CoV-2 by FDA under an Emergency Use Authorization (EUA). This EUA will remain in effect (meaning this test can be used) for the duration of the COVID-19 declaration under Section 564(b)(1) of the Act, 21 U.S.C. section 360bbb-3(b)(1), unless the authorization is terminated or revoked.  Performed at [facility], [address], [place], [place] [address]   Culture, blood (Routine X 2) w Reflex  to ID Panel     Status: None (Preliminary result)   Collection Time: 01/15/24  8:10 AM   Specimen: BLOOD  Result Value Ref Range Status   Specimen Description BLOOD RIGHT ANTECUBITAL  Final   Special Requests AEROBIC BOTTLE ONLY Blood Culture adequate volume  Final   Culture   Final    NO GROWTH 3 DAYS Performed at [facility], [address] [place], [place] [address]    Report Status PENDING  Incomplete  Culture, blood (Routine X 2) w Reflex to ID Panel     Status: None (Preliminary result)   Collection Time: 01/15/24  8:10 AM   Specimen: BLOOD  Result Value Ref Range Status   Specimen Description BLOOD BLOOD RIGHT ARM  Final   Special Requests AEROBIC BOTTLE ONLY Blood Culture adequate volume  Final   Culture   Final    NO GROWTH 3 DAYS Performed at [facility], [address], [place], [place] [address]    Report Status PENDING  Incomplete         Radiology Studies: DG FLUORO GUIDED NEEDLE PLC ASPIRATION/INJECTION LOC Result Date: 01/18/2024 INDICATION: 83 year old female with acute right knee pain. Large effusion suspected by xray. Aspiration requested. EXAM: ARTHROCENTESIS OF LARGE JOINT COMPARISON:  None Available. CONTRAST:  None FLUOROSCOPY TIME:  seconds  COMPLICATIONS: None immediate PROCEDURE: Informed written consent was obtained from the patient after discussion of the risks, benefits and alternatives to treatment. The patient was placed supine on the fluoroscopy table and the right knee was supported in a slight degree of flexion and external rotation. An ace wrap was placed at the level of the distal thigh. The right knee was localized with fluoroscopy. The skin overlying the knee joint, medial to the patellar tendon was prepped and draped in usual sterile fashion. A 20 gauge spinal needle was advanced into the joint space directed toward the medial aspect of the lateral femoral arthroplasty component after the overlying soft tissues were anesthetized with 1% lidocaine. Immediate return of fluid confirmed positioning within the knee joint. A fluoroscopic image was saved and sent to PACs. Approximately 33 ml of thick, yellow, cloudy joint fluid was aspirated. Samples were sent to the laboratory for analysis as ordered by the clinical team. The needle was removed and a dressing was placed. The patient tolerated procedure well without immediate postprocedural complication. IMPRESSION: Successful fluoroscopic guided aspiration of the right knee. Performed by: Kacie Matthews PA-C Electronically Signed   By: Creasie Doctor M.D.   On: 01/18/2024 14:27   DG Shoulder Left Port Result Date: 01/17/2024 CLINICAL DATA:  Fall, left shoulder and elbow pain, limited range of motion EXAM: LEFT SHOULDER COMPARISON:  None Available. FINDINGS: No acute fracture or dislocation. Soft tissues are radiographically unremarkable. Degenerative arthritis AC and glenohumeral joints. IMPRESSION: No acute fracture or dislocation. Electronically Signed   By: Rozell Cornet M.D.   On: 01/17/2024 20:40   DG Elbow 2 Views Left Result Date: 01/17/2024 CLINICAL DATA:  Fall, left shoulder and elbow pain, limited range of motion EXAM: LEFT ELBOW - 2 VIEW COMPARISON:  None Available. FINDINGS:  No acute fracture or dislocation. Question nondisplaced fracture of the volar radial head. Large elbow joint effusion. Soft tissues are unremarkable. IMPRESSION: Large elbow joint effusion with suspected nondisplaced fracture of the radial head. Electronically Signed   By: Rozell Cornet M.D.   On: 01/17/2024 20:39        Scheduled Meds:  amLODipine   2.5 mg Oral Daily  aspirin EC  81 mg Oral Daily   carvedilol   12.5 mg Oral BID WC   vitamin B-12  1,000 mcg Oral Daily   enoxaparin  (LOVENOX ) injection  40 mg Subcutaneous Q24H   loratadine   10 mg Oral QPM   montelukast   10 mg Oral QHS   polyethylene glycol  17 g Oral Daily   senna-docusate  1 tablet Oral BID   Continuous Infusions:  azithromycin  500 mg (01/17/24 1822)   cefTRIAXone  (ROCEPHIN )  IV 2 g (01/17/24 1724)     LOS: 5 days    Time spent: 55 minutes    Christopher Glasscock D Mason Sole, DO [facility]  If 7PM-7AM, please contact night-coverage [email or web address] 01/18/2024, 3:12 PM

## 2024-01-19 ENCOUNTER — Telehealth: Payer: Self-pay | Admitting: Family Medicine

## 2024-01-19 DIAGNOSIS — M6281 Muscle weakness (generalized): Secondary | ICD-10-CM | POA: Diagnosis not present

## 2024-01-19 DIAGNOSIS — E785 Hyperlipidemia, unspecified: Secondary | ICD-10-CM | POA: Diagnosis not present

## 2024-01-19 DIAGNOSIS — M25461 Effusion, right knee: Secondary | ICD-10-CM | POA: Diagnosis not present

## 2024-01-19 DIAGNOSIS — I700 Atherosclerosis of aorta: Secondary | ICD-10-CM | POA: Diagnosis not present

## 2024-01-19 DIAGNOSIS — R29818 Other symptoms and signs involving the nervous system: Secondary | ICD-10-CM | POA: Diagnosis not present

## 2024-01-19 DIAGNOSIS — E782 Mixed hyperlipidemia: Secondary | ICD-10-CM | POA: Diagnosis not present

## 2024-01-19 DIAGNOSIS — E43 Unspecified severe protein-calorie malnutrition: Secondary | ICD-10-CM | POA: Diagnosis not present

## 2024-01-19 DIAGNOSIS — T796XXS Traumatic ischemia of muscle, sequela: Secondary | ICD-10-CM | POA: Diagnosis not present

## 2024-01-19 DIAGNOSIS — D519 Vitamin B12 deficiency anemia, unspecified: Secondary | ICD-10-CM | POA: Diagnosis not present

## 2024-01-19 DIAGNOSIS — M11261 Other chondrocalcinosis, right knee: Secondary | ICD-10-CM | POA: Diagnosis not present

## 2024-01-19 DIAGNOSIS — M47812 Spondylosis without myelopathy or radiculopathy, cervical region: Secondary | ICD-10-CM | POA: Diagnosis not present

## 2024-01-19 DIAGNOSIS — R7303 Prediabetes: Secondary | ICD-10-CM | POA: Diagnosis not present

## 2024-01-19 DIAGNOSIS — J3089 Other allergic rhinitis: Secondary | ICD-10-CM | POA: Diagnosis not present

## 2024-01-19 DIAGNOSIS — S8001XA Contusion of right knee, initial encounter: Secondary | ICD-10-CM | POA: Diagnosis not present

## 2024-01-19 DIAGNOSIS — M170 Bilateral primary osteoarthritis of knee: Secondary | ICD-10-CM | POA: Diagnosis not present

## 2024-01-19 DIAGNOSIS — I10 Essential (primary) hypertension: Secondary | ICD-10-CM | POA: Diagnosis not present

## 2024-01-19 DIAGNOSIS — S8002XA Contusion of left knee, initial encounter: Secondary | ICD-10-CM | POA: Diagnosis not present

## 2024-01-19 DIAGNOSIS — I2510 Atherosclerotic heart disease of native coronary artery without angina pectoris: Secondary | ICD-10-CM | POA: Diagnosis not present

## 2024-01-19 DIAGNOSIS — E538 Deficiency of other specified B group vitamins: Secondary | ICD-10-CM | POA: Diagnosis not present

## 2024-01-19 DIAGNOSIS — R4189 Other symptoms and signs involving cognitive functions and awareness: Secondary | ICD-10-CM | POA: Diagnosis not present

## 2024-01-19 DIAGNOSIS — J181 Lobar pneumonia, unspecified organism: Secondary | ICD-10-CM | POA: Diagnosis not present

## 2024-01-19 DIAGNOSIS — J189 Pneumonia, unspecified organism: Secondary | ICD-10-CM | POA: Diagnosis not present

## 2024-01-19 LAB — MAGNESIUM: Magnesium: 1.7 mg/dL (ref 1.7–2.4)

## 2024-01-19 LAB — BASIC METABOLIC PANEL WITH GFR
Anion gap: 5 (ref 5–15)
BUN: 22 mg/dL (ref 8–23)
CO2: 25 mmol/L (ref 22–32)
Calcium: 9.4 mg/dL (ref 8.9–10.3)
Chloride: 103 mmol/L (ref 98–111)
Creatinine, Ser: 0.49 mg/dL (ref 0.44–1.00)
GFR, Estimated: >60 mL/min (ref >60)
Glucose, Bld: 99 mg/dL (ref 70–99)
Potassium: 4.1 mmol/L (ref 3.5–5.1)
Sodium: 133 mmol/L — ABNORMAL LOW (ref 135–145)

## 2024-01-19 LAB — SPECIMEN STATUS REPORT

## 2024-01-19 LAB — CBC
HCT: 26 % — ABNORMAL LOW (ref 36.0–46.0)
Hemoglobin: 8.8 g/dL — ABNORMAL LOW (ref 12.0–15.0)
MCH: 36.8 pg — ABNORMAL HIGH (ref 26.0–34.0)
MCHC: 33.8 g/dL (ref 30.0–36.0)
MCV: 108.8 fL — ABNORMAL HIGH (ref 80.0–100.0)
Platelets: 376 10*3/uL (ref 150–400)
RBC: 2.39 MIL/uL — ABNORMAL LOW (ref 3.87–5.11)
RDW: 15.4 % (ref 11.5–15.5)
WBC: 10.5 10*3/uL (ref 4.0–10.5)
nRBC: 0.6 % — ABNORMAL HIGH (ref 0.0–0.2)

## 2024-01-19 LAB — FERRITIN

## 2024-01-19 LAB — IRON

## 2024-01-19 LAB — B12 AND FOLATE PANEL

## 2024-01-19 LAB — HEMOGLOBIN A1C
Est. average glucose Bld gHb Est-mCnc: 126 mg/dL
Hgb A1c MFr Bld: 6 % — ABNORMAL HIGH (ref 4.8–5.6)

## 2024-01-19 MED ORDER — PREDNISONE 20 MG PO TABS
30.0000 mg | ORAL_TABLET | Freq: Every day | ORAL | Status: DC
  Administered 2024-01-19: 30 mg via ORAL
  Filled 2024-01-19: qty 1

## 2024-01-19 MED ORDER — HYDROCODONE-ACETAMINOPHEN 5-325 MG PO TABS: 1.0000 | ORAL_TABLET | Freq: Four times a day (QID) | ORAL | 0 refills | Status: DC | PRN

## 2024-01-19 MED ORDER — PREDNISONE 10 MG PO TABS: ORAL_TABLET | ORAL | 0 refills | Status: AC

## 2024-01-19 NOTE — Progress Notes (Signed)
 Physical Therapy Treatment Patient Details Name: Kristen Cox MRN: [id number] DOB: 1941-06-16 Today's Date: 01/19/2024   History of Present Illness Kristen Cox  is a 83 y.o. female, past medical history of hypertension, hyperlipidemia, she presents to ED due to fall, weakness, patient report progressive weakness over the last week, deconditioning, frailty, difficulty ambulating and walking, but she denies any focal deficits, she had 2 falls this morning, she denies loss of consciousness, head trauma, or any anticoagulation use, no chest pain, no shortness of breath, she does report cough and congestion over the last few days as well.  - NAD chest x-ray significant for left lung base pneumonia with atelectasis, and white blood cell was elevated at 13.6, total CK was elevated at 2007, troponins were elevated 54> 48, but her EKG is nonacute and she denies any chest pain or shortness of breath, [facility] consulted to admit    PT Comments  Patient demonstrates slightly increased BLE strength for completing sit to stands and transferring to chair.  Patient continues to c/o pain at IV insertion left wrist, less pain in knees and able to take a few side steps using RW during transfer to chair, unsafe to attempt steps forward backwards due to fall risk and tolerated sitting up in chair after therapy - nursing staff notified. Patient will benefit from continued skilled physical therapy in hospital and recommended venue below to increase strength, balance, endurance for safe ADLs and gait.     If plan is discharge home, recommend the following: A lot of help with bathing/dressing/bathroom;A lot of help with walking and/or transfers;Help with stairs or ramp for entrance;Assistance with cooking/housework   Can travel by private vehicle     No  Equipment Recommendations  None recommended by PT    Recommendations for Other Services       Precautions / Restrictions Precautions Precautions:  Fall Recall of Precautions/Restrictions: Intact Restrictions Weight Bearing Restrictions Per Provider Order: No     Mobility  Bed Mobility Overal bed mobility: Needs Assistance Bed Mobility: Supine to Sit     Supine to sit: Mod assist, Max assist     General bed mobility comments: increased time, labored movement with limited use of LUE due to c/o wrist pain at IV insertion    Transfers Overall transfer level: Needs assistance Equipment used: Rolling walker (2 wheels) Transfers: Sit to/from Stand, Bed to chair/wheelchair/BSC Sit to Stand: Mod assist   Step pivot transfers: Mod assist, Max assist       General transfer comment: increased BLE strength for completing sit to stands and taking side steps to transfer to chair    Ambulation/Gait Ambulation/Gait assistance: Mod assist, Max assist Gait Distance (Feet): 5 Feet Assistive device: Rolling walker (2 wheels) Gait Pattern/deviations: Decreased step length - right, Decreased step length - left, Decreased stride length, Trunk flexed, Knees buckling Gait velocity: slow     General Gait Details: increased BLE strength for completing side steps at bedside using RW, unsteady on feet with occasional buckling of knees due to weakness, less c/o knee pain   Stairs             Wheelchair Mobility     Tilt Bed    Modified Rankin (Stroke Patients Only)       Balance Overall balance assessment: Needs assistance Sitting-balance support: Feet supported, No upper extremity supported Sitting balance-Leahy Scale: Fair Sitting balance - Comments: fair/good seated at EOB   Standing balance support: Reliant on assistive device for balance, During  functional activity, Bilateral upper extremity supported Standing balance-Leahy Scale: Poor Standing balance comment: using RW                            Communication Communication Communication: No apparent difficulties  Cognition Arousal: Alert Behavior  During Therapy: WFL for tasks assessed/performed   PT - Cognitive impairments: No apparent impairments                         Following commands: Intact      Cueing Cueing Techniques: Verbal cues, Tactile cues  Exercises General Exercises - Lower Extremity Long Arc Quad: Seated, AROM, Strengthening, Both, 10 reps Heel Slides: AROM, Strengthening, Both, 10 reps, Supine Hip Flexion/Marching: Seated, AROM, Strengthening, Both, 10 reps Toe Raises: Seated, AROM, Strengthening, Both, 10 reps Heel Raises: Seated, AROM, Strengthening, Both, 10 reps    General Comments        Pertinent Vitals/Pain Pain Assessment Pain Assessment: Faces Faces Pain Scale: Hurts little more Pain Location: left wrist at IV insertion, no c/o pain in legs Pain Descriptors / Indicators: Discomfort, Sharp, Grimacing Pain Intervention(s): Limited activity within patient's tolerance, Monitored during session, Repositioned    Home Living                          Prior Function            PT Goals (current goals can now be found in the care plan section) Acute Rehab PT Goals Patient Stated Goal: return home after rehab PT Goal Formulation: With patient/family Time For Goal Achievement: 01/29/24 Potential to Achieve Goals: Good Progress towards PT goals: Progressing toward goals    Frequency    Min 3X/week      PT Plan      Co-evaluation              AM-PAC PT "6 Clicks" Mobility   Outcome Measure  Help needed turning from your back to your side while in a flat bed without using bedrails?: A Lot Help needed moving from lying on your back to sitting on the side of a flat bed without using bedrails?: A Lot Help needed moving to and from a bed to a chair (including a wheelchair)?: A Lot Help needed standing up from a chair using your arms (e.g., wheelchair or bedside chair)?: A Lot Help needed to walk in hospital room?: A Lot Help needed climbing 3-5 steps with a  railing? : Total 6 Click Score: 11    End of Session   Activity Tolerance: Patient tolerated treatment well;Patient limited by fatigue Patient left: in chair;with call bell/phone within reach Nurse Communication: Mobility status PT Visit Diagnosis: Unsteadiness on feet (R26.81);Other abnormalities of gait and mobility (R26.89);Muscle weakness (generalized) (M62.81)     Time: 9562-1308 PT Time Calculation (min) (ACUTE ONLY): 21 min  Charges:    $Therapeutic Exercise: 8-22 mins $Therapeutic Activity: 8-22 mins PT General Charges $$ ACUTE PT VISIT: 1 Visit                     12:44 PM, 01/19/24 Walton Guppy, MPT Physical Therapist with [facility] [phone] office [phone] mobile phone

## 2024-01-19 NOTE — Telephone Encounter (Signed)
Immunization report faxed

## 2024-01-19 NOTE — Discharge Summary (Signed)
 Physician Discharge Summary  Kristen Cox ZOX:[id number] DOB: Oct 15, 1940 DOA: 01/13/2024  PCP: Kristen Fret, MD  Admit date: 01/13/2024  Discharge date: 01/19/2024  Admitted From:Home  Disposition:  SNF  Recommendations for Outpatient Follow-up:  Follow up with PCP in 1-2 weeks Patient has completed course of treatment for pneumonia Remain on prednisone  taper as prescribed for acute pseudogout flare to right knee status post arthrocentesis 5/28 Norco prescribed for pain management for 10 tablets and 0 refills Continue home medications as prior  Home Health: None  Equipment/Devices: None  Discharge Condition:Stable  CODE STATUS: Full  Diet recommendation: Heart Healthy  Brief/Interim Summary: Kristen Cox is a 83 y.o. female, PMH of HTN, HLD presented to ED due to likely mechanical fall, with progressive weakness over the last week.  She was noted to have some rhabdomyolysis related to the fall with mild transaminitis as well as troponin elevation.  Chest x-ray significant for left lung base pneumonia with atelectasis.  Patient was admitted for community-acquired pneumonia and has completed course of IV antibiotics.  She was noted to have right knee swelling/effusion and has undergone arthrocentesis 5/28 that is now demonstrating the presence of pseudogout.  PT recommending SNF placement.  She will remain on steroid taper as prescribed as well as pain medications and is now in stable condition for discharge to SNF.  No other acute events or concerns noted.  Discharge Diagnoses:  Principal Problem:   Pneumonia Active Problems:   Hyperlipemia   Essential hypertension   H/O falling   Rhabdomyolysis   Lobar pneumonia (HCC)  Principal discharge diagnosis: Community-acquired pneumonia with mild rhabdomyolysis secondary to fall.  Large right knee effusion related to pseudogout.  Discharge Instructions  Discharge Instructions     Diet - low sodium heart healthy   Complete  by: As directed    Increase activity slowly   Complete by: As directed       Allergies as of 01/19/2024       Reactions   Amlodipine  Other (See Comments)   Excess cramping and stiffness of hands and legs   Benazepril Other (See Comments)   Patient does not recall being on this medication and/or what reaction she might have had.   Metoprolol  Other (See Comments)   CRAMPS CRAMPS CRAMPS        Medication List     TAKE these medications    acetaminophen  650 MG CR tablet Commonly known as: TYLENOL  Take 650 mg by mouth every 8 (eight) hours as needed for pain.   amLODipine  2.5 MG tablet Commonly known as: NORVASC  Take 1 tablet (2.5 mg total) by mouth daily.   aspirin EC 81 MG tablet Take 81 mg by mouth daily.   atorvastatin  40 MG tablet Commonly known as: LIPITOR TAKE 1 TABLET(40 MG) BY MOUTH DAILY   carvedilol  12.5 MG tablet Commonly known as: COREG  TAKE 1 TABLET(12.5 MG) BY MOUTH TWICE DAILY WITH A MEAL   cholecalciferol  25 MCG (1000 UNIT) tablet Commonly known as: VITAMIN D3 Take 1,000 Units by mouth daily.   cyclobenzaprine  5 MG tablet Commonly known as: FLEXERIL  Take one tablet by mouth twice daily for 5 days, then one tablet by mouth at bedtime as needed for neck spasm   ezetimibe  10 MG tablet Commonly known as: ZETIA  Take 1 tablet (10 mg total) by mouth daily.   Flaxseed Oil 1200 MG Caps Take by mouth.   HYDROcodone -acetaminophen  5-325 MG tablet Commonly known as: NORCO/VICODIN Take 1 tablet by mouth every 6 (six)  hours as needed for moderate pain (pain score 4-6) or severe pain (pain score 7-10).   levocetirizine 5 MG tablet Commonly known as: XYZAL  Take one tablet by mouth two times daily   montelukast  10 MG tablet Commonly known as: SINGULAIR  Take 1 tablet (10 mg total) by mouth at bedtime.   olmesartan  20 MG tablet Commonly known as: BENICAR  TAKE 1 TABLET(20 MG) BY MOUTH DAILY   predniSONE  10 MG tablet Commonly known as:  DELTASONE  Take 3 tablets (30 mg total) by mouth daily with breakfast for 1 day, THEN 2 tablets (20 mg total) daily with breakfast for 3 days. Start taking on: Jan 20, 2024 What changed:  medication strength See the new instructions.        Contact information for follow-up providers     Kristen Fret, MD. Schedule an appointment as soon as possible for a visit in 1 week(s).   Specialty: Family Medicine Contact information: [address] [place] [address] [phone]              Contact information for after-discharge care     Destination     [facility] .   Service: Skilled Nursing Contact information: [address] [place]   [address] [phone]                    Allergies  Allergen Reactions   Amlodipine  Other (See Comments)    Excess cramping and stiffness of hands and legs   Benazepril Other (See Comments)    Patient does not recall being on this medication and/or what reaction she might have had.   Metoprolol  Other (See Comments)    CRAMPS CRAMPS CRAMPS    Consultations: IR for arthrocentesis   Procedures/Studies: DG FLUORO GUIDED NEEDLE PLC ASPIRATION/INJECTION LOC Result Date: 01/18/2024 INDICATION: 83 year old female with acute right knee pain. Large effusion suspected by xray. Aspiration requested. EXAM: ARTHROCENTESIS OF LARGE JOINT COMPARISON:  None Available. CONTRAST:  None FLUOROSCOPY TIME:  seconds COMPLICATIONS: None immediate PROCEDURE: Informed written consent was obtained from the patient after discussion of the risks, benefits and alternatives to treatment. The patient was placed supine on the fluoroscopy table and the right knee was supported in a slight degree of flexion and external rotation. An ace wrap was placed at the level of the distal thigh. The right knee was localized with fluoroscopy. The skin overlying the knee joint, medial to the patellar tendon  was prepped and draped in usual sterile fashion. A 20 gauge spinal needle was advanced into the joint space directed toward the medial aspect of the lateral femoral arthroplasty component after the overlying soft tissues were anesthetized with 1% lidocaine. Immediate return of fluid confirmed positioning within the knee joint. A fluoroscopic image was saved and sent to PACs. Approximately 33 ml of thick, yellow, cloudy joint fluid was aspirated. Samples were sent to the laboratory for analysis as ordered by the clinical team. The needle was removed and a dressing was placed. The patient tolerated procedure well without immediate postprocedural complication. IMPRESSION: Successful fluoroscopic guided aspiration of the right knee. Performed by: Kacie Matthews PA-C Electronically Signed   By: Creasie Doctor M.D.   On: 01/18/2024 14:27   DG Shoulder Left Port Result Date: 01/17/2024 CLINICAL DATA:  Fall, left shoulder and elbow pain, limited range of motion EXAM: LEFT SHOULDER COMPARISON:  None Available. FINDINGS: No acute fracture or dislocation. Soft tissues are radiographically unremarkable. Degenerative arthritis AC  and glenohumeral joints. IMPRESSION: No acute fracture or dislocation. Electronically Signed   By: Rozell Cornet M.D.   On: 01/17/2024 20:40   DG Elbow 2 Views Left Result Date: 01/17/2024 CLINICAL DATA:  Fall, left shoulder and elbow pain, limited range of motion EXAM: LEFT ELBOW - 2 VIEW COMPARISON:  None Available. FINDINGS: No acute fracture or dislocation. Question nondisplaced fracture of the volar radial head. Large elbow joint effusion. Soft tissues are unremarkable. IMPRESSION: Large elbow joint effusion with suspected nondisplaced fracture of the radial head. Electronically Signed   By: Rozell Cornet M.D.   On: 01/17/2024 20:39   DG CHEST PORT 1 VIEW Result Date: 01/16/2024 CLINICAL DATA:  100030 Leukocytosis 100030 EXAM: PORTABLE CHEST 1 VIEW COMPARISON:  Jan 13, 2024 September 09, 2023 FINDINGS: The cardiomediastinal silhouette is unchanged in contour.Atherosclerotic calcifications. Similar blunting of the LEFT costophrenic angle compared to prior. No pneumothorax. No acute pleuroparenchymal abnormality. IMPRESSION: Similar blunting of the LEFT costophrenic angle compared to prior, favored to reflect a summation of pericardial fat as seen on recent CT Electronically Signed   By: Clancy Crimes M.D.   On: 01/16/2024 11:17   DG Knee Right Port Result Date: 01/15/2024 EXAM: 1 or 2 VIEW(S) XRAY OF THE RIGHT KNEE 01/15/2024 09:43:57 AM COMPARISON: 02/09/2022 CLINICAL HISTORY: [id number] Fall [id number]. Pt c/o severe bilateral leg pain from knee to foot, right worse than left, recent falls x 2 in the last weak. FINDINGS: BONES AND JOINTS: No acute fracture. No focal osseous lesion. No joint dislocation. Degenerative changes are similar to the prior study. A large joint effusion is present. Chondrocalcinosis is present. Loose bodies are present. SOFT TISSUES: The soft tissues are unremarkable. IMPRESSION: 1. No acute fracture or dislocation. 2. Large joint effusion, chondrocalcinosis, and loose bodies. Electronically signed by: Audree Leas MD 01/15/2024 11:49 AM EDT RP Workstation: [id number]   DG Tibia/Fibula Right Port Result Date: 01/15/2024 CLINICAL DATA:  Severe bilateral leg pain.  Recent falls. EXAM: PORTABLE RIGHT TIBIA AND FIBULA - 2 VIEW COMPARISON:  01/27/2022. FINDINGS: There are no signs of acute fracture or dislocation. Moderate to advanced degenerative changes noted in the knee with mild ankle joint osteoarthritis. Soft tissues are unremarkable. IMPRESSION: 1. No acute findings. 2. Moderate to advanced knee osteoarthritis. Electronically Signed   By: Kimberley Penman M.D.   On: 01/15/2024 11:49   DG Tibia/Fibula Left Port Result Date: 01/15/2024 CLINICAL DATA:  Severe bilateral leg pain. EXAM: PORTABLE LEFT TIBIA AND FIBULA - 2 VIEW COMPARISON:  01/27/2022. FINDINGS: There  are no signs of acute fracture or dislocation. Degenerative changes are identified within the left knee. There is a curvilinear ossific density adjacent to the medial femoral condyle. Which appears unchanged from the previous exam and may reflect sequelae of remote medial collateral ligament injury. IMPRESSION: 1. No acute findings. 2. Left knee osteoarthritis. Electronically Signed   By: Kimberley Penman M.D.   On: 01/15/2024 11:47   DG Knee Left Port Result Date: 01/15/2024 EXAM: 2 VIEW(S) XRAY OF THE LEFT KNEE 01/15/2024 09:43:57 AM COMPARISON: 01/27/2022 CLINICAL HISTORY: [id number] Fall 295188. Pt c/o severe bilateral leg pain from knee to foot, right worse than left, recent falls x 2 in the last weak FINDINGS: BONES AND JOINTS: No acute fracture. No focal osseous lesion. No joint dislocation. Joint effusion is present. Tri compartmental degenerative changes are similar to the prior study. SOFT TISSUES: Atherosclerotic calcifications are present. IMPRESSION: 1. No acute fracture or dislocation. 2. Joint effusion. 3. Tri compartmental degenerative changes,  similar to the prior study. Electronically signed by: Audree Leas MD 01/15/2024 11:47 AM EDT RP Workstation: [id number]   CT ABDOMEN PELVIS W CONTRAST Result Date: 01/13/2024 CLINICAL DATA:  Fall with abdominal pain EXAM: CT ABDOMEN AND PELVIS WITH CONTRAST TECHNIQUE: Multidetector CT imaging of the abdomen and pelvis was performed using the standard protocol following bolus administration of intravenous contrast. RADIATION DOSE REDUCTION: This exam was performed according to the departmental dose-optimization program which includes automated exposure control, adjustment of the mA and/or kV according to patient size and/or use of iterative reconstruction technique. CONTRAST:  OMNIPAQUE  IOHEXOL  300 MG/ML  SOLN COMPARISON:  CT 05/21/2013, pelvic ultrasound 05/23/2013 FINDINGS: Lower chest: Lung bases demonstrate no acute airspace disease. Coronary  vascular calcification. Partially visualized cystic and solid mass in the anterior mediastinum, measures 2.7 by 2.2 cm, previously 2.7 cm, and stable dating back to 08/26/2022 chest CT. Hepatobiliary: Multiple hepatic cysts. Dystrophic calcification within the cyst at the right posterior hepatic dome. No calcified gallstones. No biliary dilatation Pancreas: Unremarkable. No pancreatic ductal dilatation or surrounding inflammatory changes. Spleen: Splenic granuloma. Interval splenic atrophy compared with 2014 exam. Adrenals/Urinary Tract: Adrenal glands are normal. Kidneys show no hydronephrosis. Urinary bladder is distended Stomach/Bowel: Stomach nonenlarged. There is no dilated small bowel. Diverticular disease of the left colon. Negative appendix. No acute bowel wall thickening Vascular/Lymphatic: Aortic atherosclerosis. No enlarged abdominal or pelvic lymph nodes. Reproductive: Solid-appearing left adnexal mass measuring 4.6 x 3.8 cm, previously 3.5 cm. Uterus otherwise unremarkable Other: Negative for pelvic effusion or free air. Musculoskeletal: Multilevel degenerative changes. No acute osseous abnormality. IMPRESSION: 1. No CT evidence for acute intra-abdominal or pelvic abnormality. 2. Diverticular disease of the left colon without acute inflammatory process. 3. 4.6 cm solid-appearing left adnexal mass, slightly increased in size compared with 2014 exam. This was thought to represent either opened undulated fibroid or solid ovarian mass by prior imaging. Given slight interval growth, follow-up nonemergent MRI could be considered if deemed clinically appropriate given patient age 83. Aortic atherosclerosis. Aortic Atherosclerosis (ICD10-I70.0). Electronically Signed   By: Esmeralda Hedge M.D.   On: 01/13/2024 16:56   CT Head Wo Contrast Result Date: 01/13/2024 CLINICAL DATA:  Multiple falls EXAM: CT HEAD WITHOUT CONTRAST CT CERVICAL SPINE WITHOUT CONTRAST TECHNIQUE: Multidetector CT imaging of the head and  cervical spine was performed following the standard protocol without intravenous contrast. Multiplanar CT image reconstructions of the cervical spine were also generated. RADIATION DOSE REDUCTION: This exam was performed according to the departmental dose-optimization program which includes automated exposure control, adjustment of the mA and/or kV according to patient size and/or use of iterative reconstruction technique. COMPARISON:  None Available. FINDINGS: CT HEAD FINDINGS Brain: No evidence of acute infarction, hemorrhage, hydrocephalus, extra-axial collection or mass lesion/mass effect. Periventricular white matter hypodensity. Nonacute lacunar infarction of the right lentiform nuclei and corona radiata. Vascular: No hyperdense vessel or unexpected calcification. Skull: Normal. Negative for fracture or focal lesion. Sinuses/Orbits: No acute finding. Other: None. CT CERVICAL SPINE FINDINGS Alignment: Degenerative straightening of the normal cervical lordosis. Skull base and vertebrae: No acute fracture. No primary bone lesion or focal pathologic process. Soft tissues and spinal canal: No prevertebral fluid or swelling. No visible canal hematoma. Disc levels: Moderate multilevel cervical disc degenerative disease from C4-C7. Upper chest: Negative. Other: None. IMPRESSION: 1. No acute intracranial pathology. Small-vessel white matter disease and nonacute lacunar infarction of the right lentiform nuclei and corona radiata. 2. No fracture or static subluxation of the cervical spine. 3. Moderate multilevel  cervical disc degenerative disease from C4-C7. Electronically Signed   By: Fredricka Jenny M.D.   On: 01/13/2024 15:05   CT Cervical Spine Wo Contrast Result Date: 01/13/2024 CLINICAL DATA:  Multiple falls EXAM: CT HEAD WITHOUT CONTRAST CT CERVICAL SPINE WITHOUT CONTRAST TECHNIQUE: Multidetector CT imaging of the head and cervical spine was performed following the standard protocol without intravenous contrast.  Multiplanar CT image reconstructions of the cervical spine were also generated. RADIATION DOSE REDUCTION: This exam was performed according to the departmental dose-optimization program which includes automated exposure control, adjustment of the mA and/or kV according to patient size and/or use of iterative reconstruction technique. COMPARISON:  None Available. FINDINGS: CT HEAD FINDINGS Brain: No evidence of acute infarction, hemorrhage, hydrocephalus, extra-axial collection or mass lesion/mass effect. Periventricular white matter hypodensity. Nonacute lacunar infarction of the right lentiform nuclei and corona radiata. Vascular: No hyperdense vessel or unexpected calcification. Skull: Normal. Negative for fracture or focal lesion. Sinuses/Orbits: No acute finding. Other: None. CT CERVICAL SPINE FINDINGS Alignment: Degenerative straightening of the normal cervical lordosis. Skull base and vertebrae: No acute fracture. No primary bone lesion or focal pathologic process. Soft tissues and spinal canal: No prevertebral fluid or swelling. No visible canal hematoma. Disc levels: Moderate multilevel cervical disc degenerative disease from C4-C7. Upper chest: Negative. Other: None. IMPRESSION: 1. No acute intracranial pathology. Small-vessel white matter disease and nonacute lacunar infarction of the right lentiform nuclei and corona radiata. 2. No fracture or static subluxation of the cervical spine. 3. Moderate multilevel cervical disc degenerative disease from C4-C7. Electronically Signed   By: Fredricka Jenny M.D.   On: 01/13/2024 15:05   DG Chest Port 1 View Result Date: 01/13/2024 CLINICAL DATA:  Weakness. EXAM: PORTABLE CHEST 1 VIEW COMPARISON:  07/28/2022 FINDINGS: The cardio pericardial silhouette is enlarged. Right lung clear. There is some collapse/consolidative opacity at the left base and probable tiny left pleural effusion. No acute bony abnormality. IMPRESSION: Collapse/consolidative opacity at the left  base with probable tiny left pleural effusion. Electronically Signed   By: Donnal Fusi M.D.   On: 01/13/2024 12:57   DG Cervical Spine Complete Result Date: 01/10/2024 CLINICAL DATA:  Severe neck pain and spasm. EXAM: CERVICAL SPINE - COMPLETE 4+ VIEW COMPARISON:  None Available. FINDINGS: There is no acute fracture or subluxation of the cervical spine. There is advanced osteopenia. Multilevel degenerative changes with disc space narrowing and endplate irregularity and spurring. The visualized posterior elements and odontoid appear intact. There is anatomic alignment of the lateral masses of C1 and C2. Multilevel neural foramina narrowing. The soft tissues are unremarkable. IMPRESSION: 1. No acute findings. 2. Multilevel degenerative changes. Electronically Signed   By: Angus Bark M.D.   On: 01/10/2024 12:41     Discharge Exam: Vitals:   01/19/24 0334 01/19/24 0847  BP: (!) 142/70 (!) 140/69  Pulse: 88 82  Resp: 16 17  Temp: 98.9 F (37.2 C) 99.2 F (37.3 C)  SpO2: 94% 91%   Vitals:   01/18/24 1427 01/18/24 2027 01/19/24 0334 01/19/24 0847  BP: (!) 135/58 (!) 101/47 (!) 142/70 (!) 140/69  Pulse: 86 84 88 82  Resp: 18 16 16 17   Temp: 98.6 F (37 C) 99.5 F (37.5 C) 98.9 F (37.2 C) 99.2 F (37.3 C)  TempSrc: Oral Oral Oral Oral  SpO2: 97% 94% 94% 91%  Weight:      Height:        General: Pt is alert, awake, not in acute distress Cardiovascular: RRR, S1/S2 +,  no rubs, no gallops Respiratory: CTA bilaterally, no wheezing, no rhonchi Abdominal: Soft, NT, ND, bowel sounds + Extremities: no edema, no cyanosis    The results of significant diagnostics from this hospitalization (including imaging, microbiology, ancillary and laboratory) are listed below for reference.     Microbiology: Recent Results (from the past 240 hours)  Resp panel by RT-PCR (RSV, Flu A&B, Covid) Anterior Nasal Swab     Status: None   Collection Time: 01/14/24  7:18 AM   Specimen: Anterior Nasal  Swab  Result Value Ref Range Status   SARS Coronavirus 2 by RT PCR NEGATIVE NEGATIVE Final    Comment: (NOTE) SARS-CoV-2 target nucleic acids are NOT DETECTED.  The SARS-CoV-2 RNA is generally detectable in upper respiratory specimens during the acute phase of infection. The lowest concentration of SARS-CoV-2 viral copies this assay can detect is 138 copies/mL. A negative result does not preclude SARS-Cov-2 infection and should not be used as the sole basis for treatment or other patient management decisions. A negative result may occur with  improper specimen collection/handling, submission of specimen other than nasopharyngeal swab, presence of viral mutation(s) within the areas targeted by this assay, and inadequate number of viral copies(<138 copies/mL). A negative result must be combined with clinical observations, patient history, and epidemiological information. The expected result is Negative.  Fact Sheet for Patients:  BloggerCourse.com  Fact Sheet for Healthcare Providers:  SeriousBroker.it  This test is no t yet approved or cleared by the [place]  FDA and  has been authorized for detection and/or diagnosis of SARS-CoV-2 by FDA under an Emergency Use Authorization (EUA). This EUA will remain  in effect (meaning this test can be used) for the duration of the COVID-19 declaration under Section 564(b)(1) of the Act, 21 U.S.C.section 360bbb-3(b)(1), unless the authorization is terminated  or revoked sooner.       Influenza A by PCR NEGATIVE NEGATIVE Final   Influenza B by PCR NEGATIVE NEGATIVE Final    Comment: (NOTE) The Xpert Xpress SARS-CoV-2/FLU/RSV plus assay is intended as an aid in the diagnosis of influenza from Nasopharyngeal swab specimens and should not be used as a sole basis for treatment. Nasal washings and aspirates are unacceptable for Xpert Xpress SARS-CoV-2/FLU/RSV testing.  Fact Sheet for  Patients: BloggerCourse.com  Fact Sheet for Healthcare Providers: SeriousBroker.it  This test is not yet approved or cleared by the [place]  FDA and has been authorized for detection and/or diagnosis of SARS-CoV-2 by FDA under an Emergency Use Authorization (EUA). This EUA will remain in effect (meaning this test can be used) for the duration of the COVID-19 declaration under Section 564(b)(1) of the Act, 21 U.S.C. section 360bbb-3(b)(1), unless the authorization is terminated or revoked.     Resp Syncytial Virus by PCR NEGATIVE NEGATIVE Final    Comment: (NOTE) Fact Sheet for Patients: BloggerCourse.com  Fact Sheet for Healthcare Providers: SeriousBroker.it  This test is not yet approved or cleared by the [place]  FDA and has been authorized for detection and/or diagnosis of SARS-CoV-2 by FDA under an Emergency Use Authorization (EUA). This EUA will remain in effect (meaning this test can be used) for the duration of the COVID-19 declaration under Section 564(b)(1) of the Act, 21 U.S.C. section 360bbb-3(b)(1), unless the authorization is terminated or revoked.  Performed at [facility], [address] [place], [place] [address]   Culture, blood (Routine X 2) w Reflex to ID Panel     Status: None (Preliminary result)   Collection Time: 01/15/24  8:10 AM   Specimen: BLOOD  Result Value Ref Range Status   Specimen Description BLOOD RIGHT ANTECUBITAL  Final   Special Requests AEROBIC BOTTLE ONLY Blood Culture adequate volume  Final   Culture   Final    NO GROWTH 4 DAYS Performed at [facility], [address] [place], [place] [address]    Report Status PENDING  Incomplete  Culture, blood (Routine X 2) w Reflex to ID Panel     Status: None (Preliminary result)   Collection Time: 01/15/24  8:10 AM   Specimen: BLOOD  Result Value Ref Range Status   Specimen  Description BLOOD BLOOD RIGHT ARM  Final   Special Requests AEROBIC BOTTLE ONLY Blood Culture adequate volume  Final   Culture   Final    NO GROWTH 4 DAYS Performed at [facility], [address] [place], [place] [address]    Report Status PENDING  Incomplete  Body fluid culture w Gram Stain     Status: None (Preliminary result)   Collection Time: 01/18/24  2:00 PM   Specimen: Synovium  Result Value Ref Range Status   Specimen Description SYNOVIAL  Final   Special Requests RIGHT KNEE  Final   Gram Stain   Final    RARE WBC PRESENT, PREDOMINANTLY PMN NO ORGANISMS SEEN    Culture   Final    NO GROWTH < 24 HOURS Performed at [facility], [address] [place], [place] [address]    Report Status PENDING  Incomplete  Anaerobic culture w Gram Stain     Status: None (Preliminary result)   Collection Time: 01/18/24  2:00 PM   Specimen: Synovium  Result Value Ref Range Status   Specimen Description SYNOVIAL  Final   Special Requests RIGHT KNEE  Final   Gram Stain   Final    FEW WBC PRESENT, PREDOMINANTLY PMN NO ORGANISMS SEEN Performed at [facility], [address] [place], [place] [address]    Culture PENDING  Incomplete   Report Status PENDING  Incomplete     Labs: BNP (last 3 results) No results for input(s): "BNP" in the last 8760 hours. Basic Metabolic Panel: Recent Labs  Lab 01/14/24 0414 01/14/24 0720 01/15/24 0303 01/16/24 0652 01/18/24 0438 01/19/24 0456  NA 138  --  133* 132* 135 133*  K 3.3*  --  4.7 4.3 4.3 4.1  CL 109  --  106 97* 106 103  CO2 26  --  23 21* 25 25  GLUCOSE 98  --  114* 96 120* 99  BUN 18  --  13 15 26* 22  CREATININE 0.56  --  0.56 0.56 0.56 0.49  CALCIUM  8.9  --  8.9 8.8* 9.3 9.4  MG  --  1.6* 1.8  --   --  1.7   Liver Function Tests: Recent Labs  Lab 01/13/24 1132 01/15/24 0303  AST 74* 41  ALT 61* 39  ALKPHOS 71 70  BILITOT 0.6 1.0  PROT 7.1 6.1*  ALBUMIN 3.4* 2.9*   No results for input(s): "LIPASE",  "AMYLASE" in the last 168 hours. No results for input(s): "AMMONIA" in the last 168 hours. CBC: Recent Labs  Lab 01/13/24 1041 01/14/24 0414 01/15/24 0303 01/16/24 8469 01/17/24 0451 01/18/24 0438 01/19/24 0456  WBC 13.6*   < > 14.6* 14.9* 15.4* 14.5* 10.5  NEUTROABS 9.6*  --   --   --   --   --   --   HGB  10.6*   < > 11.2* 10.4* 8.9* 8.6* 8.8*  HCT 31.1*   < > 33.7* 31.8* 26.4* 25.9* 26.0*  MCV 109.5*   < > 113.9* 112.8* 109.1* 110.2* 108.8*  PLT 481*   < > 369 309 346 357 376   < > = values in this interval not displayed.   Cardiac Enzymes: Recent Labs  Lab 01/13/24 1132 01/14/24 0414 01/15/24 0303 01/16/24 0652 01/17/24 0451  CKTOTAL 2,007* 1,503* 846* 285* 109   BNP: Invalid input(s): "POCBNP" CBG: No results for input(s): "GLUCAP" in the last 168 hours. D-Dimer No results for input(s): "DDIMER" in the last 72 hours. Hgb A1c No results for input(s): "HGBA1C" in the last 72 hours. Lipid Profile No results for input(s): "CHOL", "HDL", "LDLCALC", "TRIG", "CHOLHDL", "LDLDIRECT" in the last 72 hours. Thyroid  function studies No results for input(s): "TSH", "T4TOTAL", "T3FREE", "THYROIDAB" in the last 72 hours.  Invalid input(s): "FREET3" Anemia work up No results for input(s): "VITAMINB12", "FOLATE", "FERRITIN", "TIBC", "IRON", "RETICCTPCT" in the last 72 hours. Urinalysis    Component Value Date/Time   COLORURINE STRAW (A) 01/13/2024 1139   APPEARANCEUR CLEAR 01/13/2024 1139   LABSPEC 1.008 01/13/2024 1139   PHURINE 6.0 01/13/2024 1139   GLUCOSEU NEGATIVE 01/13/2024 1139   HGBUR NEGATIVE 01/13/2024 1139   BILIRUBINUR NEGATIVE 01/13/2024 1139   KETONESUR NEGATIVE 01/13/2024 1139   PROTEINUR NEGATIVE 01/13/2024 1139   NITRITE NEGATIVE 01/13/2024 1139   LEUKOCYTESUR TRACE (A) 01/13/2024 1139   Sepsis Labs Recent Labs  Lab 01/16/24 0652 01/17/24 0451 01/18/24 0438 01/19/24 0456  WBC 14.9* 15.4* 14.5* 10.5   Microbiology Recent Results (from the past  240 hours)  Resp panel by RT-PCR (RSV, Flu A&B, Covid) Anterior Nasal Swab     Status: None   Collection Time: 01/14/24  7:18 AM   Specimen: Anterior Nasal Swab  Result Value Ref Range Status   SARS Coronavirus 2 by RT PCR NEGATIVE NEGATIVE Final    Comment: (NOTE) SARS-CoV-2 target nucleic acids are NOT DETECTED.  The SARS-CoV-2 RNA is generally detectable in upper respiratory specimens during the acute phase of infection. The lowest concentration of SARS-CoV-2 viral copies this assay can detect is 138 copies/mL. A negative result does not preclude SARS-Cov-2 infection and should not be used as the sole basis for treatment or other patient management decisions. A negative result may occur with  improper specimen collection/handling, submission of specimen other than nasopharyngeal swab, presence of viral mutation(s) within the areas targeted by this assay, and inadequate number of viral copies(<138 copies/mL). A negative result must be combined with clinical observations, patient history, and epidemiological information. The expected result is Negative.  Fact Sheet for Patients:  BloggerCourse.com  Fact Sheet for Healthcare Providers:  SeriousBroker.it  This test is no t yet approved or cleared by the [place]  FDA and  has been authorized for detection and/or diagnosis of SARS-CoV-2 by FDA under an Emergency Use Authorization (EUA). This EUA will remain  in effect (meaning this test can be used) for the duration of the COVID-19 declaration under Section 564(b)(1) of the Act, 21 U.S.C.section 360bbb-3(b)(1), unless the authorization is terminated  or revoked sooner.       Influenza A by PCR NEGATIVE NEGATIVE Final   Influenza B by PCR NEGATIVE NEGATIVE Final    Comment: (NOTE) The Xpert Xpress SARS-CoV-2/FLU/RSV plus assay is intended as an aid in the diagnosis of influenza from Nasopharyngeal swab specimens and should  not be used as a sole basis for  treatment. Nasal washings and aspirates are unacceptable for Xpert Xpress SARS-CoV-2/FLU/RSV testing.  Fact Sheet for Patients: BloggerCourse.com  Fact Sheet for Healthcare Providers: SeriousBroker.it  This test is not yet approved or cleared by the [place]  FDA and has been authorized for detection and/or diagnosis of SARS-CoV-2 by FDA under an Emergency Use Authorization (EUA). This EUA will remain in effect (meaning this test can be used) for the duration of the COVID-19 declaration under Section 564(b)(1) of the Act, 21 U.S.C. section 360bbb-3(b)(1), unless the authorization is terminated or revoked.     Resp Syncytial Virus by PCR NEGATIVE NEGATIVE Final    Comment: (NOTE) Fact Sheet for Patients: BloggerCourse.com  Fact Sheet for Healthcare Providers: SeriousBroker.it  This test is not yet approved or cleared by the [place]  FDA and has been authorized for detection and/or diagnosis of SARS-CoV-2 by FDA under an Emergency Use Authorization (EUA). This EUA will remain in effect (meaning this test can be used) for the duration of the COVID-19 declaration under Section 564(b)(1) of the Act, 21 U.S.C. section 360bbb-3(b)(1), unless the authorization is terminated or revoked.  Performed at [facility], [address] [place], [place] [address]   Culture, blood (Routine X 2) w Reflex to ID Panel     Status: None (Preliminary result)   Collection Time: 01/15/24  8:10 AM   Specimen: BLOOD  Result Value Ref Range Status   Specimen Description BLOOD RIGHT ANTECUBITAL  Final   Special Requests AEROBIC BOTTLE ONLY Blood Culture adequate volume  Final   Culture   Final    NO GROWTH 4 DAYS Performed at [facility], [address], [place], [place] [address]    Report Status PENDING  Incomplete  Culture, blood (Routine X 2) w Reflex to ID  Panel     Status: None (Preliminary result)   Collection Time: 01/15/24  8:10 AM   Specimen: BLOOD  Result Value Ref Range Status   Specimen Description BLOOD BLOOD RIGHT ARM  Final   Special Requests AEROBIC BOTTLE ONLY Blood Culture adequate volume  Final   Culture   Final    NO GROWTH 4 DAYS Performed at [facility], [address], [place], [place] [address]    Report Status PENDING  Incomplete  Body fluid culture w Gram Stain     Status: None (Preliminary result)   Collection Time: 01/18/24  2:00 PM   Specimen: Synovium  Result Value Ref Range Status   Specimen Description SYNOVIAL  Final   Special Requests RIGHT KNEE  Final   Gram Stain   Final    RARE WBC PRESENT, PREDOMINANTLY PMN NO ORGANISMS SEEN    Culture   Final    NO GROWTH < 24 HOURS Performed at [facility], [address] [place], [place] [address]    Report Status PENDING  Incomplete  Anaerobic culture w Gram Stain     Status: None (Preliminary result)   Collection Time: 01/18/24  2:00 PM   Specimen: Synovium  Result Value Ref Range Status   Specimen Description SYNOVIAL  Final   Special Requests RIGHT KNEE  Final   Gram Stain   Final    FEW WBC PRESENT, PREDOMINANTLY PMN NO ORGANISMS SEEN Performed at [facility], [address], [place], [place] [address]    Culture PENDING  Incomplete   Report Status PENDING  Incomplete     Time coordinating discharge: 35 minutes  SIGNED:   Cornelius Dill, DO [facility]  01/19/2024, 12:25 PM  If 7PM-7AM, please contact night-coverage [email or web address]

## 2024-01-19 NOTE — Plan of Care (Signed)
  Problem: Education: Goal: Knowledge of General Education information will improve Description: Including pain rating scale, medication(s)/side effects and non-pharmacologic comfort measures Outcome: Progressing   Problem: Elimination: Goal: Will not experience complications related to urinary retention Outcome: Progressing   Problem: Pain Managment: Goal: General experience of comfort will improve and/or be controlled Outcome: Progressing   Problem: Activity: Goal: Ability to tolerate increased activity will improve Outcome: Progressing   Problem: Clinical Measurements: Goal: Ability to maintain a body temperature in the normal range will improve Outcome: Progressing   Problem: Respiratory: Goal: Ability to maintain adequate ventilation will improve Outcome: Progressing Goal: Ability to maintain a clear airway will improve Outcome: Progressing

## 2024-01-19 NOTE — Progress Notes (Signed)
 Patient discharged to [facility] skilled Nursing facility,report called and given to Hassel Lins LPN. IV discontinued,catheter intact.Staff to accompany patient to awaiting facility.

## 2024-01-19 NOTE — Care Management Important Message (Signed)
 Important Message  Patient Details  Name: RAYYA YAGI MRN: [id number] Date of Birth: 1940-09-05   Important Message Given:  Yes - Medicare IM     Azaryah Heathcock L Seydou Hearns 01/19/2024, 11:47 AM

## 2024-01-19 NOTE — Telephone Encounter (Signed)
 Copied from CRM [id number]. Topic: General - Other >> Jan 19, 2024 12:38 PM Turkey B wrote: Reason for CRM: Tammy from [facility] nursing, , asking for info for pts immunizations info, for, flu ,tdap, pnuemonia , shingles and covid. Fx is [phone]

## 2024-01-19 NOTE — Progress Notes (Signed)
 Nurse at bedside,patient alert and oriented times four this am.Patient has generalize weakness noted. Patient c/o pain and discomfort to the left hand,painful to move it. Patient was able to take medications whole without any difficulty. Family at bedside. Plan of care on going.

## 2024-01-19 NOTE — TOC Transition Note (Signed)
 Transition of Care [facility]) - Discharge Note   Patient Details  Name: Kristen Cox MRN: [id number] Date of Birth: [birth year]  Transition of Care [facility]) CM/SW Contact:  Geraldina Klinefelter, RN Phone Number: 01/19/2024, 12:24 PM   Clinical Narrative:    Pt approved for [facility], room #124. Nurse to call report. TOC signing off.   Barriers to Discharge: Continued Medical Work up   Patient Goals and CMS Choice         Discharge Placement                       Discharge Plan and Services Additional resources added to the After Visit Summary for                                       Social Drivers of Health (SDOH) Interventions SDOH Screenings   Food Insecurity: No Food Insecurity (01/13/2024)  Housing: Low Risk  (01/13/2024)  Transportation Needs: No Transportation Needs (01/13/2024)  Utilities: Not At Risk (01/13/2024)  Alcohol Screen: Low Risk  (01/20/2022)  Depression (PHQ2-9): Medium Risk (01/10/2024)  Financial Resource Strain: Low Risk  (07/13/2023)  Physical Activity: Sufficiently Active (07/13/2023)  Social Connections: Socially Isolated (01/13/2024)  Stress: No Stress Concern Present (07/13/2023)  Tobacco Use: Medium Risk (01/13/2024)  Health Literacy: Adequate Health Literacy (07/13/2023)     Readmission Risk Interventions    01/13/2024    8:40 PM  Readmission Risk Prevention Plan  Post Dischage Appt Complete  Medication Screening Complete  Transportation Screening Complete

## 2024-01-20 ENCOUNTER — Non-Acute Institutional Stay (SKILLED_NURSING_FACILITY): Payer: Self-pay | Admitting: Adult Health

## 2024-01-20 ENCOUNTER — Encounter: Payer: Self-pay | Admitting: Adult Health

## 2024-01-20 DIAGNOSIS — J189 Pneumonia, unspecified organism: Secondary | ICD-10-CM

## 2024-01-20 DIAGNOSIS — E538 Deficiency of other specified B group vitamins: Secondary | ICD-10-CM

## 2024-01-20 DIAGNOSIS — I10 Essential (primary) hypertension: Secondary | ICD-10-CM | POA: Diagnosis not present

## 2024-01-20 DIAGNOSIS — R7303 Prediabetes: Secondary | ICD-10-CM | POA: Diagnosis not present

## 2024-01-20 DIAGNOSIS — I700 Atherosclerosis of aorta: Secondary | ICD-10-CM

## 2024-01-20 DIAGNOSIS — M11261 Other chondrocalcinosis, right knee: Secondary | ICD-10-CM | POA: Diagnosis not present

## 2024-01-20 DIAGNOSIS — E782 Mixed hyperlipidemia: Secondary | ICD-10-CM | POA: Diagnosis not present

## 2024-01-20 DIAGNOSIS — T796XXS Traumatic ischemia of muscle, sequela: Secondary | ICD-10-CM | POA: Diagnosis not present

## 2024-01-20 DIAGNOSIS — E43 Unspecified severe protein-calorie malnutrition: Secondary | ICD-10-CM

## 2024-01-20 DIAGNOSIS — J3089 Other allergic rhinitis: Secondary | ICD-10-CM

## 2024-01-20 LAB — CULTURE, BLOOD (ROUTINE X 2)
Special Requests: ADEQUATE
Special Requests: ADEQUATE

## 2024-01-20 LAB — CYTOLOGY - NON PAP

## 2024-01-20 NOTE — Progress Notes (Signed)
 Location:  [facility] Room Number: 124 Place of Service:  SNF (31)   CODE STATUS: full   Allergies  Allergen Reactions   Amlodipine  Other (See Comments)    Excess cramping and stiffness of hands and legs   Benazepril Other (See Comments)    Patient does not recall being on this medication and/or what reaction she might have had.   Metoprolol  Other (See Comments)    CRAMPS CRAMPS CRAMPS    Chief Complaint  Patient presents with   Hospitalization Follow-up    HPI:  She is a 83 year old woman who has been hospitalized from 01-13-24 through 01-19-24. Her past medical history includes: hypertension; hyperlipidemia. She presented to the ED after a mechanical fall. She had progressive weakness over the past week prior to her hospitalization. She noted to have a cough and shortness of breath. She has had 2 more falls on the day of admission. Her chest x-ray demonstrated left lower lobe pneumonia with atelectasis. She has completed her abt for her pneumonia. She did have some mild rhabdomyolysis. She had a right knee arthrocentesis on 01-18-24 demonstrating pseudogout.she will need to complete her prednisone  taper.  She is here for short term rehab with her goal to return back home. She will continue to be followed for her chronic illnesses including:   Prediabetes   Mixed hyperlipidemia:  Essential hypertension   Past Medical History:  Diagnosis Date   Deep vein thrombosis (DVT) (HCC) 08/2016   Right leg   Dry eyes 2010   on restasis - Dr. Barbra Boone    Gout 2009   Hyperlipidemia    Hypertension    Spinal stenosis     Past Surgical History:  Procedure Laterality Date   CESAREAN SECTION     COLONOSCOPY  04/09/2009   BMW:UXLK papilla otherwise, normal rectum/Left-sided diverticula/Cecal polyp status post cold snare and removal/The remainder of the colonic mucosa appeared normal. Adenomatous polyps, next TCS 03/2016 per RMR.   COLONOSCOPY N/A 05/19/2016   Procedure:  COLONOSCOPY;  Surgeon: Suzette Espy, MD;  Location: [facility];  Service: Endoscopy;  Laterality: N/A;  1:00 PM    Social History   Socioeconomic History   Marital status: Widowed    Spouse name: Not on file   Number of children: 2   Years of education: Not on file   Highest education level: 10th grade  Occupational History   Occupation: retired   Tobacco Use   Smoking status: Former    Current packs/day: 0.00    Average packs/day: 0.5 packs/day for 10.0 years (5.0 ttl pk-yrs)    Types: Cigarettes    Start date: 10/06/1973    Quit date: 10/07/1983    Years since quitting: 40.3   Smokeless tobacco: Never  Vaping Use   Vaping status: Never Used  Substance and Sexual Activity   Alcohol use: No   Drug use: No   Sexual activity: Not Currently    Birth control/protection: Post-menopausal  Other Topics Concern   Not on file  Social History Narrative   Not on file   Social Drivers of Health   Financial Resource Strain: Low Risk  (07/13/2023)   Overall Financial Resource Strain (CARDIA)    Difficulty of Paying Living Expenses: Not hard at all  Food Insecurity: No Food Insecurity (01/13/2024)   Hunger Vital Sign    Worried About Running Out of Food in the Last Year: Never true    Ran Out of Food in the  Last Year: Never true  Transportation Needs: No Transportation Needs (01/13/2024)   PRAPARE - Administrator, Civil Service (Medical): No    Lack of Transportation (Non-Medical): No  Physical Activity: Sufficiently Active (07/13/2023)   Exercise Vital Sign    Days of Exercise per Week: 5 days    Minutes of Exercise per Session: 30 min  Stress: No Stress Concern Present (07/13/2023)   [employer] of Occupational Health - Occupational Stress Questionnaire    Feeling of Stress : Not at all  Social Connections: Socially Isolated (01/13/2024)   Social Connection and Isolation Panel [NHANES]    Frequency of Communication with Friends and Family: Twice a week     Frequency of Social Gatherings with Friends and Family: Three times a week    Attends Religious Services: Never    Active Member of Clubs or Organizations: No    Attends Banker Meetings: Never    Marital Status: Widowed  Intimate Partner Violence: Not At Risk (01/13/2024)   Humiliation, Afraid, Rape, and Kick questionnaire    Fear of Current or Ex-Partner: No    Emotionally Abused: No    Physically Abused: No    Sexually Abused: No   Family History  Problem Relation Age of Onset   Stroke Mother    Stroke Father    Diabetes Sister    Pulmonary embolism Sister    Cirrhosis Brother    Colon cancer Neg Hx       VITAL SIGNS BP 122/76   Pulse 80   Temp 97.8 F (36.6 C)   Resp 20   Ht 5\' 3"  (1.6 m)   Wt 162 lb (73.5 kg)   SpO2 96%   BMI 28.70 kg/m   Outpatient Encounter Medications as of 01/20/2024  Medication Sig   acetaminophen  (TYLENOL ) 650 MG CR tablet Take 650 mg by mouth every 8 (eight) hours as needed for pain.   amLODipine  (NORVASC ) 2.5 MG tablet Take 1 tablet (2.5 mg total) by mouth daily.   aspirin EC 81 MG tablet Take 81 mg by mouth daily.   atorvastatin  (LIPITOR) 40 MG tablet TAKE 1 TABLET(40 MG) BY MOUTH DAILY   carvedilol  (COREG ) 12.5 MG tablet TAKE 1 TABLET(12.5 MG) BY MOUTH TWICE DAILY WITH A MEAL   cholecalciferol  (VITAMIN D3) 25 MCG (1000 UNIT) tablet Take 1,000 Units by mouth daily.   cyclobenzaprine  (FLEXERIL ) 5 MG tablet Take one tablet by mouth twice daily for 5 days, then one tablet by mouth at bedtime as needed for neck spasm   ezetimibe  (ZETIA ) 10 MG tablet Take 1 tablet (10 mg total) by mouth daily.   Flaxseed, Linseed, (FLAXSEED OIL) 1200 MG CAPS Take by mouth.   HYDROcodone -acetaminophen  (NORCO/VICODIN) 5-325 MG tablet Take 1 tablet by mouth every 6 (six) hours as needed for moderate pain (pain score 4-6) or severe pain (pain score 7-10).   levocetirizine (XYZAL ) 5 MG tablet Take one tablet by mouth two times daily   montelukast   (SINGULAIR ) 10 MG tablet Take 1 tablet (10 mg total) by mouth at bedtime.   olmesartan  (BENICAR ) 20 MG tablet TAKE 1 TABLET(20 MG) BY MOUTH DAILY   predniSONE  (DELTASONE ) 10 MG tablet Take 3 tablets (30 mg total) by mouth daily with breakfast for 1 day, THEN 2 tablets (20 mg total) daily with breakfast for 3 days.   No facility-administered encounter medications on file as of 01/20/2024.     SIGNIFICANT DIAGNOSTIC EXAMS  LABS REVIEWED:   01-10-24: hgb  A1c 6.0; tsh 1.520 vitamin D  50.6 01-13-24: wbc 13.6; hgb 10.6; hct 31.1; mcv 109.5 plt 481; glucose 106; bun 17; creat 0.66; k+ 4.5; na++ 135; ca 9.7; gfr >60; protein 7.1 albumin 3.4; ast 74; alt 61; vitamin B 12: 51; folate 19.3; iron 61; tibc 250; ck 2007 01-15-24: wbc 14.6; hgb 11.2; hct 33.7; mcv 113.9 plt 369; glucose 114; bun 13; creat 0.56; k+ 4.7; na++ 133; ca 8.9; gfr >60; protein 6.1 albumin 2.9; ck 846; mag 1.8 01-19-24: wbc 10.5; hgb 8.8; hct 26.0; mcv 108.8 plt 376; glucose 99; bun 22; creat 0.49; k+ 4.1; na++ 133; ca 9.4 gfr >60; mag 1.7   Review of Systems  Constitutional:  Negative for malaise/fatigue.  Respiratory:  Negative for cough and shortness of breath.   Cardiovascular:  Negative for chest pain, palpitations and leg swelling.  Gastrointestinal:  Negative for abdominal pain, constipation and heartburn.  Musculoskeletal:  Negative for back pain, joint pain and myalgias.  Skin: Negative.   Neurological:  Negative for dizziness.  Psychiatric/Behavioral:  The patient is not nervous/anxious.     Physical Exam Constitutional:      General: She is not in acute distress.    Appearance: She is well-developed. She is not diaphoretic.  Neck:     Thyroid : No thyromegaly.  Cardiovascular:     Rate and Rhythm: Normal rate and regular rhythm.     Pulses: Normal pulses.     Heart sounds: Normal heart sounds.  Pulmonary:     Effort: Pulmonary effort is normal. No respiratory distress.     Breath sounds: Normal breath sounds.   Abdominal:     General: Bowel sounds are normal. There is no distension.     Palpations: Abdomen is soft.     Tenderness: There is no abdominal tenderness.  Musculoskeletal:        General: Normal range of motion.     Cervical back: Neck supple.     Right lower leg: No edema.     Left lower leg: No edema.  Lymphadenopathy:     Cervical: No cervical adenopathy.  Skin:    General: Skin is warm and dry.  Neurological:     Mental Status: She is alert and oriented to person, place, and time.  Psychiatric:        Mood and Affect: Mood normal.     ASSESSMENT/ PLAN:  TODAY  Pseudogout right knee: is status post arthrocentesis on 01-18-24; will complete prednisone  will monitor his status; will continue therapy to improve upon level of independence with adls. Has vicodin 5/325 mg every 6 hours as needed; has flexeril  5 mg nightly as needed   2. Pneumonia of left lower lobe due to infectious organism: has completed antibiotics will monitor   3.  Chronic non-seasonal allergic rhinitis: will continue singulair  10 mg daily and will continue claritin  10 mg daily   4. Traumatic rhabdomyolysis sequela: CK levels are returning to normal   5.  Prediabetes: hgb A1c 37.90; is 83 year old is well managed with diet  6. Mixed hyperlipidemia: will continue lipitor 40 mg daily and zetia  10 mg daily   7. Essential hypertension: b/p 122/76 will continue norvasc  2.5 mg daily; coreg  12.5 mg twice daily benicar  20 mg daily asa 81 mg daily   8. Aortic atherosclerosis (01-13-24 ct) is on asa and statin  9. Vitamin B 12 deficiency: will give IM weekly for 3 weeks then 1000 mcg daily   10. Severe protein calorie malnutrition: albumin  2.9 will begin prosource 30 mL three times daily    Britt Candle NP [facility]  call [phone]

## 2024-01-21 LAB — BODY FLUID CULTURE W GRAM STAIN: Culture: NO GROWTH

## 2024-01-23 ENCOUNTER — Non-Acute Institutional Stay (SKILLED_NURSING_FACILITY): Payer: Self-pay | Admitting: Internal Medicine

## 2024-01-23 ENCOUNTER — Encounter: Payer: Self-pay | Admitting: Internal Medicine

## 2024-01-23 DIAGNOSIS — T796XXS Traumatic ischemia of muscle, sequela: Secondary | ICD-10-CM

## 2024-01-23 DIAGNOSIS — M11261 Other chondrocalcinosis, right knee: Secondary | ICD-10-CM | POA: Diagnosis not present

## 2024-01-23 DIAGNOSIS — E43 Unspecified severe protein-calorie malnutrition: Secondary | ICD-10-CM

## 2024-01-23 DIAGNOSIS — E538 Deficiency of other specified B group vitamins: Secondary | ICD-10-CM

## 2024-01-23 DIAGNOSIS — J181 Lobar pneumonia, unspecified organism: Secondary | ICD-10-CM

## 2024-01-23 DIAGNOSIS — R4189 Other symptoms and signs involving cognitive functions and awareness: Secondary | ICD-10-CM | POA: Diagnosis not present

## 2024-01-23 DIAGNOSIS — R29818 Other symptoms and signs involving the nervous system: Secondary | ICD-10-CM | POA: Insufficient documentation

## 2024-01-23 LAB — ANAEROBIC CULTURE W GRAM STAIN

## 2024-01-23 NOTE — Assessment & Plan Note (Signed)
 She denies any active cardiopulmonary symptoms.  Cough and sputum production have resolved.

## 2024-01-23 NOTE — Assessment & Plan Note (Addendum)
 Swelling of the knees is chronic issue.  Symptoms are stable at this time.  Fusiform enlargement on exam without significant effusion. There is suggestion of varus deformity.  She is completing steroid taper.  She has topical Voltaren gel for as needed use.

## 2024-01-23 NOTE — Patient Instructions (Signed)
 See assessment and plan under each diagnosis in the problem list and acutely for this visit

## 2024-01-23 NOTE — Assessment & Plan Note (Signed)
 She denies any active myalgias.

## 2024-01-23 NOTE — Assessment & Plan Note (Signed)
 Current albumin 2.9 and total protein 6.1.  Nutritionist will consult at SNF.

## 2024-01-23 NOTE — Progress Notes (Signed)
 NURSING HOME LOCATION:  [facility] ROOM NUMBER:  [id number]  CODE STATUS: Full Code   PCP:  Alberteen Huge MD  This is a comprehensive admission note to this SNFperformed on this date less than 30 days from date of admission. Included are preadmission medical/surgical history; reconciled medication list; family history; social history and comprehensive review of systems.  Corrections and additions to the records were documented. Comprehensive physical exam was also performed. Additionally a clinical summary was entered for each active diagnosis pertinent to this admission in the Problem List to enhance continuity of care.  HPI: She was hospitalized 5/23 - 01/19/2024 presenting to the ED after two apparent mechanical falls in the context of progressive weakness over the week PTA.  Rhabdomyolysis with peak CK of 846; mild transaminitis with AST of 74 & ALT of 61;and troponin elevation to 54 were documented.  Chest x-ray revealed left lower lobe pneumonia and atelectasis. Leukocytosis with a left shift was present with peak white count of 15,400.    She completed a full course of IV antibiotics for the CAP.  Arthrocentesis for right knee effusion was performed 5/28 demonstrating pseudogout.  Steroid taper was initiated. Mild hyponatremia was documented with sodium ranging from a low of 132 up to a high of 138.  Final value was 133.  Protein/caloric malnutrition was suggested by a total protein of 6.1 and albumin of 2.9.  There was progression of macrocytic anemia; initial H/H was 10.6/31.1; final values were 8.8/26.  MCV ranged from a high of 113.9 down to the final value of 108.8.  B12 level was critically low at 51. Oral and parenteral B12 supplementation were initiated.  Final white count was 10,500.  Final AST was normal at 41 and ALT 39.Final CK total was 109.   TSH was therapeutic at 1.520.  PT/OT consulted and recommended SNF placement for rehab.  Past medical and surgical  history: Includes history of DVT, history of gout, dyslipidemia, hypertension, and spinal stenosis.  Surgeries and procedures include C-section and colonoscopy.  Family history: reviewed, non contributory due to advanced age.  Social history: Nondrinker, former smoker.   Review of systems: Clinical neurocognitive deficit was suggested based on her responses.  When asked the reason for her hospitalization and what diagnoses and recommendations had been made; her response was "fell twice going to the bathroom."  She could not tell me how long she had been down but guessed it was 2-3 hours.  Again when I asked about any other specific diagnosis her response was "everything fine."  I asked her if she had any x-rays and understood findings ;at that point she said "had pneumonia."  She did not remember having an arthrocentesis. She denied any cardiac or neurologic prodrome prior to the falls.  At this time she states she is "doing good."  She denies any active cardiopulmonary symptoms.  The swelling in the knee is a chronic issue. When I inquired as to when she had stopped smoking; she could not give me a date. She does validate cold intolerance.  She also has extrinsic rhinitis symptoms intermittently.  Constitutional: No fever, significant weight change  Eyes: No redness, discharge, pain, vision change ENT/mouth: No nasal congestion, purulent discharge, earache, change in hearing, sore throat  Cardiovascular: No chest pain, palpitations, paroxysmal nocturnal dyspnea, claudication, edema  Respiratory: No cough, sputum production, hemoptysis, DOE, significant snoring, apnea Gastrointestinal: No heartburn, dysphagia, abdominal pain, nausea /vomiting, rectal bleeding, melena, change in bowels Genitourinary: No dysuria, hematuria,  pyuria, incontinence, nocturia Dermatologic: No rash, pruritus, change in appearance of skin Neurologic: No dizziness, headache, syncope, seizures, numbness,  tingling Psychiatric: No significant anxiety, depression, insomnia, anorexia Endocrine: No change in hair/skin/nails, excessive thirst, excessive hunger, excessive urination  Hematologic/lymphatic: No significant bruising, lymphadenopathy, abnormal bleeding Allergy/immunology: No itchy/watery eyes, significant sneezing, urticaria, angioedema currently.  Physical exam:  Pertinent or positive findings: Affect tends to be somewhat flat.  As per review of systems responses; some neurocognitive deficit is suggested.  Make-up is meticulously applied; she is wearing a wig.  She is edentulous.  There is slight increase in S1.  Breath sounds are decreased.  Abdomen is protuberant.  Dorsalis pedis pulses are strong and posterior tibial pulses.  Trace edema at the ankles is present.  Fusiform enlargement of the knees is noted.  There is suggestion of some varus deformity of the knees.  General appearance: no acute distress, increased work of breathing is present.   Lymphatic: No lymphadenopathy about the head, neck, axilla. Eyes: No conjunctival inflammation or lid edema is present. There is no scleral icterus. Ears:  External ear exam shows no significant lesions or deformities.   Nose:  External nasal examination shows no deformity or inflammation. Nasal mucosa are pink and moist without lesions, exudates Neck:  No thyromegaly, masses, tenderness noted.    Heart:  Normal rate and regular rhythm. S2 normal without gallop, murmur, click, rub.  Lungs:  without wheezes, rhonchi, rales, rubs. Abdomen: Bowel sounds are normal.  Abdomen is soft and nontender with no organomegaly, hernias, masses. GU: Deferred  Extremities:  No cyanosis, clubbing. Neurologic exam:Balance, Rhomberg, finger to nose testing could not be completed due to clinical state Skin: Warm & dry w/o tenting. No significant lesions or rash.  See clinical summary under each active problem in the Problem List with associated updated  therapeutic plan

## 2024-01-23 NOTE — Assessment & Plan Note (Addendum)
 She denies any paresthesias.  She was unaware of the profound B12 deficit. It is possible that neurocognitive deficits suggested on interview may improve with B12 repletion.

## 2024-01-23 NOTE — Assessment & Plan Note (Addendum)
 BCAT MMSE will be performed at the SNF.  Serial follow-up should be performed once B12 deficiency is corrected.

## 2024-02-02 ENCOUNTER — Encounter: Payer: Self-pay | Admitting: Adult Health

## 2024-02-02 ENCOUNTER — Non-Acute Institutional Stay (SKILLED_NURSING_FACILITY): Payer: Self-pay | Admitting: Adult Health

## 2024-02-02 DIAGNOSIS — M2550 Pain in unspecified joint: Secondary | ICD-10-CM | POA: Insufficient documentation

## 2024-02-02 DIAGNOSIS — E43 Unspecified severe protein-calorie malnutrition: Secondary | ICD-10-CM | POA: Diagnosis not present

## 2024-02-02 DIAGNOSIS — E538 Deficiency of other specified B group vitamins: Secondary | ICD-10-CM

## 2024-02-02 DIAGNOSIS — I10 Essential (primary) hypertension: Secondary | ICD-10-CM | POA: Diagnosis not present

## 2024-02-02 DIAGNOSIS — I700 Atherosclerosis of aorta: Secondary | ICD-10-CM

## 2024-02-02 NOTE — Progress Notes (Signed)
 Location:  [facility] Room Number: 124 Place of Service:  SNF ([id number])   CODE STATUS: full   Allergies  Allergen Reactions   Amlodipine  Other (See Comments)    Excess cramping and stiffness of hands and legs   Benazepril Other (See Comments)    Patient does not recall being on this medication and/or what reaction she might have had.   Metoprolol  Other (See Comments)    CRAMPS CRAMPS CRAMPS    Chief Complaint  Patient presents with   Discharge Note    HPI:  She is being discharged to home with home health for pt/ot. She will not need any dme. She will need her prescriptions written and will need to follow up with her medical provider. She presented to the ED with increased weakness and falls. She was found to have a cough and shortness of breath. Her chest x-ray demonstrated left lower lobe pneumonia with atelectasis. She did have a right knee arthrocentesis on 01-18-24 which showed pseudogout. She was admitted to this facility for short term rehab. Therapy: ambulate 112 feet with rolling walker and contact guard assist; upper body with supervision and lower body with mod assist. Steps require mod assist. BRP with mod assist.       Past Medical History:  Diagnosis Date   Deep vein thrombosis (DVT) (HCC) 08/2016   Right leg   Dry eyes 2010   on restasis - Dr. Barbra Boone    Gout 2009   Hyperlipidemia    Hypertension    Spinal stenosis     Past Surgical History:  Procedure Laterality Date   CESAREAN SECTION     COLONOSCOPY  04/09/2009   ZOX:WRUE papilla otherwise, normal rectum/Left-sided diverticula/Cecal polyp status post cold snare and removal/The remainder of the colonic mucosa appeared normal. Adenomatous polyps, next TCS 03/2016 per RMR.   COLONOSCOPY N/A 05/19/2016   Procedure: COLONOSCOPY;  Surgeon: Suzette Espy, MD;  Location: [facility];  Service: Endoscopy;  Laterality: N/A;  1:00 PM    Social History   Socioeconomic History   Marital  status: Widowed    Spouse name: Not on file   Number of children: 2   Years of education: Not on file   Highest education level: 10th grade  Occupational History   Occupation: retired   Tobacco Use   Smoking status: Former    Current packs/day: 0.00    Average packs/day: 0.5 packs/day for 10.0 years (5.0 ttl pk-yrs)    Types: Cigarettes    Start date: 10/06/1973    Quit date: 10/07/1983    Years since quitting: 40.3   Smokeless tobacco: Never  Vaping Use   Vaping status: Never Used  Substance and Sexual Activity   Alcohol use: No   Drug use: No   Sexual activity: Not Currently    Birth control/protection: Post-menopausal  Other Topics Concern   Not on file  Social History Narrative   Not on file   Social Drivers of Health   Financial Resource Strain: Low Risk  (07/13/2023)   Overall Financial Resource Strain (CARDIA)    Difficulty of Paying Living Expenses: Not hard at all  Food Insecurity: No Food Insecurity (01/13/2024)   Hunger Vital Sign    Worried About Running Out of Food in the Last Year: Never true    Ran Out of Food in the Last Year: Never true  Transportation Needs: No Transportation Needs (01/13/2024)   PRAPARE - Transportation    Lack of Transportation (  Medical): No    Lack of Transportation (Non-Medical): No  Physical Activity: Sufficiently Active (07/13/2023)   Exercise Vital Sign    Days of Exercise per Week: 5 days    Minutes of Exercise per Session: 30 min  Stress: No Stress Concern Present (07/13/2023)   [employer] of Occupational Health - Occupational Stress Questionnaire    Feeling of Stress : Not at all  Social Connections: Socially Isolated (01/13/2024)   Social Connection and Isolation Panel    Frequency of Communication with Friends and Family: Twice a week    Frequency of Social Gatherings with Friends and Family: Three times a week    Attends Religious Services: Never    Active Member of Clubs or Organizations: No    Attends Tax inspector Meetings: Never    Marital Status: Widowed  Intimate Partner Violence: Not At Risk (01/13/2024)   Humiliation, Afraid, Rape, and Kick questionnaire    Fear of Current or Ex-Partner: No    Emotionally Abused: No    Physically Abused: No    Sexually Abused: No   Family History  Problem Relation Age of Onset   Stroke Mother    Stroke Father    Diabetes Sister    Pulmonary embolism Sister    Cirrhosis Brother    Colon cancer Neg Hx       VITAL SIGNS BP 118/72   Pulse 92   Temp 98.6 F (37 C)   Resp 20   Ht 5' 3 (1.6 m)   Wt 165 lb 12.8 oz (75.2 kg)   SpO2 100%   BMI 29.37 kg/m   Outpatient Encounter Medications as of 02/02/2024  Medication Sig   acetaminophen  (TYLENOL ) 650 MG CR tablet Take 650 mg by mouth every 8 (eight) hours as needed for pain.   amLODipine  (NORVASC ) 2.5 MG tablet Take 1 tablet (2.5 mg total) by mouth daily.   aspirin  EC 81 MG tablet Take 81 mg by mouth daily.   atorvastatin  (LIPITOR) 40 MG tablet TAKE 1 TABLET(40 MG) BY MOUTH DAILY   carvedilol  (COREG ) 12.5 MG tablet TAKE 1 TABLET(12.5 MG) BY MOUTH TWICE DAILY WITH A MEAL   cholecalciferol  (VITAMIN D3) 25 MCG (1000 UNIT) tablet Take 1,000 Units by mouth daily.   [START ON 02/05/2024] cyanocobalamin  1000 MCG tablet Take 1,000 mcg by mouth daily.   Cyanocobalamin  1000 MCG/ML KIT Inject 1 mL as directed once a week.   cyclobenzaprine  (FLEXERIL ) 5 MG tablet Take 5 mg by mouth at bedtime as needed for muscle spasms.   ezetimibe  (ZETIA ) 10 MG tablet Take 1 tablet (10 mg total) by mouth daily.   Flaxseed, Linseed, (FLAXSEED OIL) 1200 MG CAPS Take by mouth.   HYDROcodone -acetaminophen  (NORCO/VICODIN) 5-325 MG tablet Take 1 tablet by mouth every 6 (six) hours as needed for moderate pain (pain score 4-6) or severe pain (pain score 7-10).   levocetirizine (XYZAL ) 5 MG tablet Take one tablet by mouth two times daily   montelukast  (SINGULAIR ) 10 MG tablet Take 1 tablet (10 mg total) by mouth at bedtime.    olmesartan  (BENICAR ) 20 MG tablet TAKE 1 TABLET(20 MG) BY MOUTH DAILY   Protein (PROSOURCE PO) Take 30 mLs by mouth 3 (three) times daily.   No facility-administered encounter medications on file as of 02/02/2024.     SIGNIFICANT DIAGNOSTIC EXAMS  LABS REVIEWED:   01-10-24: hgb A1c 6.0; tsh 1.520 vitamin D  50.6 01-13-24: wbc 13.6; hgb 10.6; hct 31.1; mcv 109.5 plt 481; glucose 106; bun  17; creat 0.66; k+ 4.5; na++ 135; ca 9.7; gfr >60; protein 7.1 albumin 3.4; ast 74; alt 61; vitamin B 12: 51; folate 19.3; iron 61; tibc 250; ck 2007 01-15-24: wbc 14.6; hgb 11.2; hct 33.7; mcv 113.9 plt 369; glucose 114; bun 13; creat 0.56; k+ 4.7; na++ 133; ca 8.9; gfr >60; protein 6.1 albumin 2.9; ck 846; mag 1.8 01-19-24: wbc 10.5; hgb 8.8; hct 26.0; mcv 108.8 plt 376; glucose 99; bun 22; creat 0.49; k+ 4.1; na++ 133; ca 9.4 gfr >60; mag 1.7   Review of Systems  Constitutional:  Negative for malaise/fatigue.  Respiratory:  Negative for cough and shortness of breath.   Cardiovascular:  Negative for chest pain, palpitations and leg swelling.  Gastrointestinal:  Negative for abdominal pain, constipation and heartburn.  Musculoskeletal:  Negative for back pain, joint pain and myalgias.  Skin: Negative.   Neurological:  Negative for dizziness.  Psychiatric/Behavioral:  The patient is not nervous/anxious.    Physical Exam Constitutional:      General: She is not in acute distress.    Appearance: She is well-developed. She is not diaphoretic.  Neck:     Thyroid : No thyromegaly.   Cardiovascular:     Rate and Rhythm: Normal rate and regular rhythm.     Heart sounds: Normal heart sounds.  Pulmonary:     Effort: Pulmonary effort is normal. No respiratory distress.     Breath sounds: Normal breath sounds.  Abdominal:     General: Bowel sounds are normal. There is no distension.     Palpations: Abdomen is soft.     Tenderness: There is no abdominal tenderness.   Musculoskeletal:        General:  Normal range of motion.     Cervical back: Neck supple.     Right lower leg: No edema.     Left lower leg: No edema.  Lymphadenopathy:     Cervical: No cervical adenopathy.   Skin:    General: Skin is warm and dry.   Neurological:     Mental Status: She is alert and oriented to person, place, and time.   Psychiatric:        Mood and Affect: Mood normal.      ASSESSMENT/ PLAN:   Patient is being discharged with the following home health services:  pt/ot to evaluate and treat as indicated for gait balance strength adl training.   Patient is being discharged with the following durable medical equipment:  none needed   Patient has been advised to f/u with their PCP in 1-2 weeks to for a transitions of care visit.  Social services at their facility was responsible for arranging this appointment.  Pt was provided with adequate prescriptions of noncontrolled medications to reach the scheduled appointment .  For controlled substances, a limited supply was provided as appropriate for the individual patient.  If the pt normally receives these medications from a pain clinic or has a contract with another physician, these medications should be received from that clinic or physician only).    A 30 day supply of her prescription medications have been sent to [facility]  Time spent with patient: 40 minutes: dme; medication; home health.   Britt Candle NP [facility]   call [phone]

## 2024-02-03 ENCOUNTER — Ambulatory Visit: Admitting: Orthopedic Surgery

## 2024-02-03 ENCOUNTER — Encounter: Payer: Self-pay | Admitting: Orthopedic Surgery

## 2024-02-03 VITALS — BP 118/72 | Ht 63.0 in | Wt 165.0 lb

## 2024-02-03 DIAGNOSIS — S8001XA Contusion of right knee, initial encounter: Secondary | ICD-10-CM | POA: Diagnosis not present

## 2024-02-03 DIAGNOSIS — M170 Bilateral primary osteoarthritis of knee: Secondary | ICD-10-CM

## 2024-02-03 DIAGNOSIS — M47812 Spondylosis without myelopathy or radiculopathy, cervical region: Secondary | ICD-10-CM | POA: Diagnosis not present

## 2024-02-03 DIAGNOSIS — M1712 Unilateral primary osteoarthritis, left knee: Secondary | ICD-10-CM

## 2024-02-03 DIAGNOSIS — S8002XA Contusion of left knee, initial encounter: Secondary | ICD-10-CM

## 2024-02-03 DIAGNOSIS — M1711 Unilateral primary osteoarthritis, right knee: Secondary | ICD-10-CM

## 2024-02-03 NOTE — Progress Notes (Signed)
   Chief Complaint  Patient presents with   Leg Pain    Bilateral since she fell    Patient is an 83 year old female who presents emergency department secondary to progressive worsening weakness over the past week. Patient notes that she is normally ambulatory without difficulty but notes that she has had increased weakness in her legs and has had 2 falls this morning. Patient notes that the initial fall she did fall directly onto her abdomen. She denies any associated chest pain or shortness of breath. She has had no dizziness, lightheadedness or syncopal events. She denies any associated numbness, paresthesias or unilateral weakness. She denies striking her head during the fall this morning but does note that she is having some pain in her neck. She denies any associated nausea, vomiting, diarrhea. She denies any changes in urination to include dysuria or hematuria.  The patient was admitted on 523 for a fall she got tangled up in a type/hose at home was discharged on the 29th and nursing home with no obvious fractures but multiple contusions complaining of neck pain bilateral leg swelling and bilateral knee pain status post right knee aspiration with no evidence of infection but evidence of pseudogout for which she was put on steroids she is here for routine orthopedic follow-up   Problem list, medical hx, medications and allergies reviewed    BP 118/72   Ht 5' 3 (1.6 m)   Wt 165 lb (74.8 kg)   BMI 29.23 kg/m   She is awake and alert she appears well-developed well-nourished well-kept Grooming hygiene excellent  Examination of all 4 extremities was performed  Right and left lower extremity bilateral leg edema bilateral knee swelling bilateral knee abrasions  Range of motion is passively normal without pain she does have restrictions in terms of knee flexion in both knees with instability  Right and left shoulder passive range of motion normal no pain Motor weakness  X-rays #1  left shoulder no fracture or dislocation #2 left elbow no fracture or dislocation #3 right tib-fib no fracture dislocation #4 left tib-fib no fracture dislocation #5 right knee large joint effusion degenerative arthritis grade 4 #6 left knee no effusion degenerative arthritis grade 4  Lab report synovial fluid analysis pseudogout noted aspiration fluid  Culture result negative  Aspirate was yellow turbid intracellular calcium  pyrophosphate crystals 4000 synovial cells 90% neutrophils  Assessment and plan  Encounter Diagnoses  Name Primary?   Primary osteoarthritis of left knee Yes   Primary osteoarthritis of right knee    Contusion of left knee, initial encounter    Contusion of right knee, initial encounter    Spondylosis without myelopathy or radiculopathy, cervical region      Multiple contusions bilateral knees shoulder History of degenerative disc disease C-spine  Pseudogout  Recommend continue prednisone  until finished  Physical therapy for range of motion exercises full weightbearing no restrictions BRE's as tolerated  No surgical indications no follow-up needed see primary care if pain and other issues arise

## 2024-02-03 NOTE — Progress Notes (Signed)
  Intake history:  BP 118/72   Ht 5' 3 (1.6 m)   Wt 165 lb (74.8 kg)   BMI 29.23 kg/m  Body mass index is 29.23 kg/m.    WHAT ARE WE SEEING YOU FOR TODAY?   Legs painful right leg gave out and she fell twice  also states legs are very swollen   How long has this bothered you? (DOI?DOS?WS?)  on 01/13/24  Anticoag.  No  Diabetes No  Heart disease No  Hypertension Yes  SMOKING HX No  Kidney disease No     Latest Ref Rng & Units 01/19/2024    4:56 AM 01/18/2024    4:38 AM 01/16/2024    6:52 AM  CMP  Glucose 70 - 99 mg/dL 99  829  96   BUN 8 - 23 mg/dL 22  26  15    Creatinine 0.44 - 1.00 mg/dL 5.62  1.30  8.65   Sodium 135 - 145 mmol/L 133  135  132   Potassium 3.5 - 5.1 mmol/L 4.1  4.3  4.3   Chloride 98 - 111 mmol/L 103  106  97   CO2 22 - 32 mmol/L 25  25  21    Calcium  8.9 - 10.3 mg/dL 9.4  9.3  8.8       Any ALLERGIES ______________ Allergies  Allergen Reactions   Amlodipine  Other (See Comments)    Excess cramping and stiffness of hands and legs   Benazepril Other (See Comments)    Patient does not recall being on this medication and/or what reaction she might have had.   Metoprolol  Other (See Comments)    CRAMPS CRAMPS CRAMPS   ________________________________   Treatment:  Have you taken:  Tylenol  Yes  Advil  No  Had PT Yes  Had injection No  Other  _______________prednisone and hyrocodone and flexeril  _________

## 2024-02-05 DIAGNOSIS — I700 Atherosclerosis of aorta: Secondary | ICD-10-CM | POA: Diagnosis not present

## 2024-02-05 DIAGNOSIS — M4800 Spinal stenosis, site unspecified: Secondary | ICD-10-CM | POA: Diagnosis not present

## 2024-02-05 DIAGNOSIS — J181 Lobar pneumonia, unspecified organism: Secondary | ICD-10-CM | POA: Diagnosis not present

## 2024-02-05 DIAGNOSIS — M11211 Other chondrocalcinosis, right shoulder: Secondary | ICD-10-CM | POA: Diagnosis not present

## 2024-02-05 DIAGNOSIS — J9811 Atelectasis: Secondary | ICD-10-CM | POA: Diagnosis not present

## 2024-02-05 DIAGNOSIS — I10 Essential (primary) hypertension: Secondary | ICD-10-CM | POA: Diagnosis not present

## 2024-02-05 DIAGNOSIS — D539 Nutritional anemia, unspecified: Secondary | ICD-10-CM | POA: Diagnosis not present

## 2024-02-05 DIAGNOSIS — E43 Unspecified severe protein-calorie malnutrition: Secondary | ICD-10-CM | POA: Diagnosis not present

## 2024-02-05 DIAGNOSIS — H04123 Dry eye syndrome of bilateral lacrimal glands: Secondary | ICD-10-CM | POA: Diagnosis not present

## 2024-02-06 ENCOUNTER — Telehealth: Payer: Self-pay

## 2024-02-06 NOTE — Transitions of Care (Post Inpatient/ED Visit) (Signed)
   02/06/2024  Name: Kristen Cox MRN: [id number] DOB: 10/21/1940  Today's TOC FU Call Status: Today's TOC FU Call Status:: Unsuccessful Call (1st Attempt) Unsuccessful Call (1st Attempt) Date: 02/06/24  Attempted to reach the patient regarding the most recent Inpatient/ED visit.  Follow Up Plan: Additional outreach attempts will be made to reach the patient to complete the Transitions of Care (Post Inpatient/ED visit) call.   Signature  Seabron [place], LPN [facility] Health Advisor [place] [facility] You Are. We Are. [facility] Direct Dial [phone]

## 2024-02-08 ENCOUNTER — Ambulatory Visit: Admitting: Family Medicine

## 2024-02-08 LAB — CULTURE, FUNGUS WITHOUT SMEAR

## 2024-02-09 ENCOUNTER — Telehealth: Payer: Self-pay

## 2024-02-09 DIAGNOSIS — H04123 Dry eye syndrome of bilateral lacrimal glands: Secondary | ICD-10-CM | POA: Diagnosis not present

## 2024-02-09 DIAGNOSIS — I700 Atherosclerosis of aorta: Secondary | ICD-10-CM | POA: Diagnosis not present

## 2024-02-09 DIAGNOSIS — M11211 Other chondrocalcinosis, right shoulder: Secondary | ICD-10-CM | POA: Diagnosis not present

## 2024-02-09 DIAGNOSIS — D539 Nutritional anemia, unspecified: Secondary | ICD-10-CM | POA: Diagnosis not present

## 2024-02-09 DIAGNOSIS — J9811 Atelectasis: Secondary | ICD-10-CM | POA: Diagnosis not present

## 2024-02-09 DIAGNOSIS — E43 Unspecified severe protein-calorie malnutrition: Secondary | ICD-10-CM | POA: Diagnosis not present

## 2024-02-09 DIAGNOSIS — M4800 Spinal stenosis, site unspecified: Secondary | ICD-10-CM | POA: Diagnosis not present

## 2024-02-09 DIAGNOSIS — I10 Essential (primary) hypertension: Secondary | ICD-10-CM | POA: Diagnosis not present

## 2024-02-09 DIAGNOSIS — J181 Lobar pneumonia, unspecified organism: Secondary | ICD-10-CM | POA: Diagnosis not present

## 2024-02-09 NOTE — Telephone Encounter (Signed)
 Copied from CRM [id number]. Topic: General - Other >> Feb 09, 2024 10:37 AM Emylou G wrote: Reason for CRM: Stacy w/[facility] did evaluation.. no therapy needed at this.Aaron Aas

## 2024-02-11 NOTE — Progress Notes (Signed)
 Cardiology Office Note   Date:  02/17/2024   ID:  Kristen Cox, Kristen Cox [birth year], MRN [id number]  PCP:  Kristen Rollene BRAVO, MD  Cardiologist:   Kristen Gull, MD  Pt presents for follow up of CAD     History of Present Illness: Kristen Cox is a 83 y.o. female with a history of CAD ( on CT scan), HTN, HL, palpitations, DVT (R leg, 2018), and hyperparathyroidism  2022- Heart monitor showed average HR 84 bpm   Occasional PACs, PVCs.  NSVT  5 beats   One episode at [facility] lasting 13 beats   I saw the pt in Oct 2024   CT showed extensive coronary calcifications   She went on to have Lexiscan  PET/CT   This showed normal perfusion.   Echo also done  LVEF normal RVEF normal    Since seen she denies CP  Her breathing is OK   She fell a couple times   Got unsteady on feet going into bathroom   Using walker now       Current Meds  Medication Sig   acetaminophen  (TYLENOL ) 650 MG CR tablet Take 650 mg by mouth every 8 (eight) hours as needed for pain.   amLODipine  (NORVASC ) 2.5 MG tablet Take 1 tablet (2.5 mg total) by mouth daily.   amLODipine  (NORVASC ) 5 MG tablet Take 5 mg by mouth daily.   aspirin  EC 81 MG tablet Take 81 mg by mouth daily.   atorvastatin  (LIPITOR) 40 MG tablet TAKE 1 TABLET(40 MG) BY MOUTH DAILY   carvedilol  (COREG ) 12.5 MG tablet TAKE 1 TABLET(12.5 MG) BY MOUTH TWICE DAILY WITH A MEAL   cholecalciferol  (VITAMIN D3) 25 MCG (1000 UNIT) tablet Take 1,000 Units by mouth daily.   cyanocobalamin  1000 MCG tablet Take 1,000 mcg by mouth daily.   cyclobenzaprine  (FLEXERIL ) 5 MG tablet Take 5 mg by mouth at bedtime as needed for muscle spasms.   ezetimibe  (ZETIA ) 10 MG tablet Take 1 tablet (10 mg total) by mouth daily.   Flaxseed, Linseed, (FLAXSEED OIL) 1200 MG CAPS Take by mouth.   levocetirizine (XYZAL ) 5 MG tablet Take one tablet by mouth two times daily   olmesartan  (BENICAR ) 20 MG tablet TAKE 1 TABLET(20 MG) BY MOUTH DAILY   Protein (PROSOURCE PO) Take 30 mLs by mouth 3  (three) times daily.     Allergies:   Amlodipine , Benazepril, and Metoprolol    Past Medical History:  Diagnosis Date   Deep vein thrombosis (DVT) (HCC) 08/2016   Right leg   Dry eyes 2010   on restasis - Dr. Darroll    Gout 2009   Hyperlipidemia    Hypertension    Spinal stenosis     Past Surgical History:  Procedure Laterality Date   CESAREAN SECTION     COLONOSCOPY  04/09/2009   MFM:Jwjo papilla otherwise, normal rectum/Left-sided diverticula/Cecal polyp status post cold snare and removal/The remainder of the colonic mucosa appeared normal. Adenomatous polyps, next TCS 03/2016 per RMR.   COLONOSCOPY N/A 05/19/2016   Procedure: COLONOSCOPY;  Surgeon: Kristen CHRISTELLA Hollingshead, MD;  Location: [facility];  Service: Endoscopy;  Laterality: N/A;  1:00 PM     Social History:  The patient  reports that she quit smoking about 40 years ago. Her smoking use included cigarettes. She started smoking about 50 years ago. She has a 5 pack-year smoking history. She has never used smokeless tobacco. She reports that she does not drink alcohol and does not use  drugs.   Family History:  The patient's family history includes Cirrhosis in her brother; Diabetes in her sister; Pulmonary embolism in her sister; Stroke in her father and mother.    ROS:  Please see the history of present illness. All other systems are reviewed and  Negative to the above problem except as noted.    PHYSICAL EXAM: VS:  BP 130/64 (BP Location: Left Arm)   Pulse 85   Ht 5' 2.5 (1.588 m)   Wt 160 lb 6.4 oz (72.8 kg)   SpO2 97%   BMI 28.87 kg/m   GEN: Well nourished, well developed, in no acute distress  HEENT: normal  Neck: no JVD, carotid bruits Cardiac: RRR; no murmurs  No  LE   edema  Respiratory:  clear to auscultation GI: soft, nontender   No hepatomegaly    EKG:  EKG not done today  Echo  Nov 2024   1. Left ventricular ejection fraction, by estimation, is 55 to 60%. Left  ventricular ejection fraction by 3D  volume is 60 %. The left ventricle has  normal function. The left ventricle has no regional wall motion  abnormalities. Left ventricular diastolic   parameters are consistent with Grade I diastolic dysfunction (impaired  relaxation).   2. Right ventricular systolic function is normal. The right ventricular  size is normal.   3. The mitral valve is normal in structure. Trivial mitral valve  regurgitation. No evidence of mitral stenosis.   4. The aortic valve is tricuspid. There is mild calcification of the  aortic valve. Aortic valve regurgitation is not visualized. Aortic valve  sclerosis/calcification is present, without any evidence of aortic  stenosis.   5. The inferior vena cava is normal in size with greater than 50%  respiratory variability, suggesting right atrial pressure of 3 mmHg.    Lexiscan  PET/CT   JUly 2024   LV perfusion is normal. There is no evidence of ischemia. There is no evidence of infarction.   Rest left ventricular function is normal. Rest EF: 52%. Stress left ventricular function is normal. Stress EF: 66%. End diastolic cavity size is normal.   Myocardial blood flow was computed to be 0.75ml/g/min at rest and 1.95ml/g/min at stress. Global myocardial blood flow reserve was 1.97 and was normal.   Coronary calcium  was present on the attenuation correction CT images. Severe coronary calcifications were present. Coronary calcifications were present in the left anterior descending artery, left circumflex artery and right coronary artery distribution(s).   The study is normal. The study is low risk.   Lipid Panel    Component Value Date/Time   CHOL 158 06/27/2023 0914   TRIG 128 06/27/2023 0914   HDL 40 06/27/2023 0914   CHOLHDL 4.0 06/27/2023 0914   CHOLHDL 4.4 06/06/2019 1422   VLDL 19 02/11/2016 0718   LDLCALC 95 06/27/2023 0914   LDLCALC 139 (H) 06/06/2019 1422      Wt Readings from Last 3 Encounters:  02/17/24 160 lb 6.4 oz (72.8 kg)  02/14/24 151 lb  (68.5 kg)  02/03/24 165 lb (74.8 kg)      ASSESSMENT AND PLAN:   1 CAD  Pt with extensive coronary calcifications on CT scan   PET/CT stress test showed normal perfusion   Echo normal She remains CP free   Follow   2  HL   LDL is much better than in past   She is on LIpitor and Zetia    LDL 72   Would check lipomed at  last visit    3  Hx tachycardia  PT denies palpitations     4  HTN   BP is well controlled on amlodipine   Now on 5 mg   She is also on carvedilol  and olmesartan     Follow up next winter   SOoner for problmes      Current medicines are reviewed at length with the patient today.  The patient does not have concerns regarding medicines.  Signed, Kristen Gull, MD  02/17/2024 11:28 AM    [facility] [address] [place], [place], [place]  [address] Phone: [phone]; Fax: [phone]

## 2024-02-13 DIAGNOSIS — M4800 Spinal stenosis, site unspecified: Secondary | ICD-10-CM | POA: Diagnosis not present

## 2024-02-13 DIAGNOSIS — E43 Unspecified severe protein-calorie malnutrition: Secondary | ICD-10-CM | POA: Diagnosis not present

## 2024-02-13 DIAGNOSIS — J9811 Atelectasis: Secondary | ICD-10-CM | POA: Diagnosis not present

## 2024-02-13 DIAGNOSIS — D539 Nutritional anemia, unspecified: Secondary | ICD-10-CM | POA: Diagnosis not present

## 2024-02-13 DIAGNOSIS — I700 Atherosclerosis of aorta: Secondary | ICD-10-CM | POA: Diagnosis not present

## 2024-02-13 DIAGNOSIS — I10 Essential (primary) hypertension: Secondary | ICD-10-CM | POA: Diagnosis not present

## 2024-02-13 DIAGNOSIS — J181 Lobar pneumonia, unspecified organism: Secondary | ICD-10-CM | POA: Diagnosis not present

## 2024-02-13 DIAGNOSIS — M11211 Other chondrocalcinosis, right shoulder: Secondary | ICD-10-CM | POA: Diagnosis not present

## 2024-02-13 DIAGNOSIS — H04123 Dry eye syndrome of bilateral lacrimal glands: Secondary | ICD-10-CM | POA: Diagnosis not present

## 2024-02-14 ENCOUNTER — Ambulatory Visit (INDEPENDENT_AMBULATORY_CARE_PROVIDER_SITE_OTHER): Payer: Self-pay | Admitting: Family Medicine

## 2024-02-14 ENCOUNTER — Ambulatory Visit (HOSPITAL_COMMUNITY)
Admission: RE | Admit: 2024-02-14 | Discharge: 2024-02-14 | Disposition: A | Discharge status: Home / Self Care | Source: Ambulatory Visit | Attending: Family Medicine | Admitting: Family Medicine

## 2024-02-14 ENCOUNTER — Ambulatory Visit: Payer: Self-pay | Admitting: Family Medicine

## 2024-02-14 ENCOUNTER — Encounter: Payer: Self-pay | Admitting: Family Medicine

## 2024-02-14 VITALS — BP 138/72 | HR 80 | Resp 16 | Ht 63.0 in | Wt 151.0 lb

## 2024-02-14 DIAGNOSIS — M170 Bilateral primary osteoarthritis of knee: Secondary | ICD-10-CM | POA: Diagnosis not present

## 2024-02-14 DIAGNOSIS — J189 Pneumonia, unspecified organism: Secondary | ICD-10-CM | POA: Insufficient documentation

## 2024-02-14 DIAGNOSIS — I10 Essential (primary) hypertension: Secondary | ICD-10-CM | POA: Diagnosis not present

## 2024-02-14 DIAGNOSIS — J181 Lobar pneumonia, unspecified organism: Secondary | ICD-10-CM

## 2024-02-14 DIAGNOSIS — J3089 Other allergic rhinitis: Secondary | ICD-10-CM

## 2024-02-14 DIAGNOSIS — R059 Cough, unspecified: Secondary | ICD-10-CM | POA: Diagnosis not present

## 2024-02-14 DIAGNOSIS — Z09 Encounter for follow-up examination after completed treatment for conditions other than malignant neoplasm: Secondary | ICD-10-CM | POA: Insufficient documentation

## 2024-02-14 NOTE — Assessment & Plan Note (Signed)
 Controlled, no change in medication DASH diet and commitment to daily physical activity for a minimum of 30 minutes discussed and encouraged, as a part of hypertension management. The importance of attaining a healthy weight is also discussed.     02/14/2024   11:31 AM 02/14/2024   11:08 AM 02/03/2024    8:14 AM 02/02/2024   11:27 AM 01/23/2024   10:31 AM 01/20/2024   11:32 AM 01/19/2024    2:19 PM  BP/Weight  Systolic BP 138 149 118 118 142 122 140  Diastolic BP 72 76 72 72 75 76 72  Wt. (Lbs)  151 165 165.8  162   BMI  26.75 kg/m2 29.23 kg/m2 29.37 kg/m2  28.7 kg/m2

## 2024-02-14 NOTE — Progress Notes (Signed)
   Kristen Cox     MRN: [id number]      DOB: 06-Dec-1940  Chief Complaint  Patient presents with   Hospitalization Follow-up    Hospital follow up. Admitted on 05/23-05/29 for fall and pneumonia     HPI Kristen Cox is here for follow upof recent hospitalization as above. Following hospital stayshe was in Nursing home for rehab until 02/06/2024 Feels better, improved appetite and energy ROS Denies recent fever or chills. Denies sinus pressure, nasal congestion, ear pain or sore throat. Denies chest congestion, productive cough or wheezing. Denies chest pains, palpitations and leg swelling Denies abdominal pain, nausea, vomiting,diarrhea or constipation.   Denies dysuria, frequency, hesitancy or incontinence. Chronic joint pain, and limitation in mobility.uses a walker Denies headaches, seizures, numbness, or tingling. Denies depression, anxiety or insomnia. Bruise on left knee PE  BP 138/72   Pulse 80   Resp 16   Ht 5' 3 (1.6 m)   Wt 151 lb (68.5 kg)   SpO2 95%   BMI 26.75 kg/m   Patient alert and oriented and in no cardiopulmonary distress.  HEENT: No facial asymmetry, EOMI,     Neck decreased ROM Chest: Clear to auscultation bilaterally.  CVS: S1, S2  murmurs, no S3.Regular rate.  ABD: Soft non tender.   Ext: No edema  MS: decreased  ROM spine, shoulders, hips and knees.  Skin: Intact, no ulcerations or rash noted.  Psych: Good eye contact, normal affect.  not anxious or depressed appearing.  CNS: CN 2-12 intact, power,  normal throughout.no focal deficits noted.   Assessment & Plan  Lobar pneumonia (HCC) Asymptomatic with  good exam, cXR for f/u and labs  Essential hypertension Controlled, no change in medication DASH diet and commitment to daily physical activity for a minimum of 30 minutes discussed and encouraged, as a part of hypertension management. The importance of attaining a healthy weight is also discussed.     02/14/2024   11:31 AM  02/14/2024   11:08 AM 02/03/2024    8:14 AM 02/02/2024   11:27 AM 01/23/2024   10:31 AM 01/20/2024   11:32 AM 01/19/2024    2:19 PM  BP/Weight  Systolic BP 138 149 118 118 142 122 140  Diastolic BP 72 76 72 72 75 76 72  Wt. (Lbs)  151 165 165.8  162   BMI  26.75 kg/m2 29.23 kg/m2 29.37 kg/m2  28.7 kg/m2        Arthritis of both knees Home safety , PT and use of assistive device is stressed, is being followed by Ortho also  Chronic non-seasonal allergic rhinitis Controlled, maintain on xyzall only, stop montelukast   Hospital discharge follow-up Patient in for follow up of recent hospitalization. Discharge summary, and laboratory and radiology data are reviewed, and any questions or concerns  are discussed. Specific issues requiring follow up are specifically addressed.

## 2024-02-14 NOTE — Patient Instructions (Addendum)
 F/U in 10 weeks , call if you need me sooner  Labs and CXR today  Stop montelukast   Cyclobenzaprine  only IF NEEDED  Pack meds for each week in pill box  Thanks for choosing [facility], we consider it a privelige to serve you.

## 2024-02-14 NOTE — Assessment & Plan Note (Signed)
Patient in for follow up of recent hospitalization. Discharge summary, and laboratory and radiology data are reviewed, and any questions or concerns  are discussed. Specific issues requiring follow up are specifically addressed.  

## 2024-02-14 NOTE — Assessment & Plan Note (Signed)
 Home safety , PT and use of assistive device is stressed, is being followed by Ortho also

## 2024-02-14 NOTE — Assessment & Plan Note (Signed)
 Asymptomatic with  good exam, cXR for f/u and labs

## 2024-02-14 NOTE — Assessment & Plan Note (Signed)
 Controlled, maintain on xyzall only, stop montelukast 

## 2024-02-15 LAB — CMP14+EGFR
ALT: 20 IU/L (ref 0–32)
AST: 20 IU/L (ref 0–40)
Albumin: 3.9 g/dL (ref 3.7–4.7)
Alkaline Phosphatase: 95 IU/L (ref 44–121)
BUN/Creatinine Ratio: 24 (ref 12–28)
BUN: 17 mg/dL (ref 8–27)
Bilirubin Total: 0.4 mg/dL (ref 0.0–1.2)
CO2: 23 mmol/L (ref 20–29)
Calcium: 10.8 mg/dL — ABNORMAL HIGH (ref 8.7–10.3)
Chloride: 105 mmol/L (ref 96–106)
Creatinine, Ser: 0.71 mg/dL (ref 0.57–1.00)
Globulin, Total: 3.1 g/dL (ref 1.5–4.5)
Glucose: 113 mg/dL — ABNORMAL HIGH (ref 70–99)
Potassium: 5.3 mmol/L — ABNORMAL HIGH (ref 3.5–5.2)
Sodium: 141 mmol/L (ref 134–144)
Total Protein: 7 g/dL (ref 6.0–8.5)
eGFR: 85 mL/min/{1.73_m2} (ref >59)

## 2024-02-15 LAB — CBC WITH DIFFERENTIAL/PLATELET
Basophils Absolute: 0 10*3/uL (ref 0.0–0.2)
Basos: 0 %
EOS (ABSOLUTE): 0 10*3/uL (ref 0.0–0.4)
Eos: 0 %
Hematocrit: 34.8 % (ref 34.0–46.6)
Hemoglobin: 11.3 g/dL (ref 11.1–15.9)
Immature Grans (Abs): 0 10*3/uL (ref 0.0–0.1)
Immature Granulocytes: 0 %
Lymphocytes Absolute: 3.2 10*3/uL — ABNORMAL HIGH (ref 0.7–3.1)
Lymphs: 41 %
MCH: 35 pg — ABNORMAL HIGH (ref 26.6–33.0)
MCHC: 32.5 g/dL (ref 31.5–35.7)
MCV: 108 fL — ABNORMAL HIGH (ref 79–97)
Monocytes Absolute: 1.2 10*3/uL — ABNORMAL HIGH (ref 0.1–0.9)
Monocytes: 15 %
Neutrophils Absolute: 3.5 10*3/uL (ref 1.4–7.0)
Neutrophils: 44 %
Platelets: 427 10*3/uL (ref 150–450)
RBC: 3.23 x10E6/uL — ABNORMAL LOW (ref 3.77–5.28)
RDW: 13.6 % (ref 11.7–15.4)
WBC: 7.9 10*3/uL (ref 3.4–10.8)

## 2024-02-17 ENCOUNTER — Telehealth: Payer: Self-pay

## 2024-02-17 ENCOUNTER — Ambulatory Visit (INDEPENDENT_AMBULATORY_CARE_PROVIDER_SITE_OTHER)

## 2024-02-17 ENCOUNTER — Encounter: Payer: Self-pay | Admitting: Internal Medicine

## 2024-02-17 ENCOUNTER — Ambulatory Visit: Attending: Internal Medicine | Admitting: Internal Medicine

## 2024-02-17 ENCOUNTER — Other Ambulatory Visit: Payer: Self-pay | Admitting: Family Medicine

## 2024-02-17 VITALS — BP 130/64 | HR 85 | Ht 62.5 in | Wt 160.4 lb

## 2024-02-17 DIAGNOSIS — H04123 Dry eye syndrome of bilateral lacrimal glands: Secondary | ICD-10-CM | POA: Diagnosis not present

## 2024-02-17 DIAGNOSIS — I10 Essential (primary) hypertension: Secondary | ICD-10-CM | POA: Diagnosis not present

## 2024-02-17 DIAGNOSIS — I2510 Atherosclerotic heart disease of native coronary artery without angina pectoris: Secondary | ICD-10-CM | POA: Diagnosis not present

## 2024-02-17 DIAGNOSIS — E538 Deficiency of other specified B group vitamins: Secondary | ICD-10-CM | POA: Diagnosis not present

## 2024-02-17 DIAGNOSIS — M11211 Other chondrocalcinosis, right shoulder: Secondary | ICD-10-CM | POA: Diagnosis not present

## 2024-02-17 DIAGNOSIS — D539 Nutritional anemia, unspecified: Secondary | ICD-10-CM | POA: Diagnosis not present

## 2024-02-17 DIAGNOSIS — I700 Atherosclerosis of aorta: Secondary | ICD-10-CM | POA: Diagnosis not present

## 2024-02-17 DIAGNOSIS — M4800 Spinal stenosis, site unspecified: Secondary | ICD-10-CM | POA: Diagnosis not present

## 2024-02-17 DIAGNOSIS — E43 Unspecified severe protein-calorie malnutrition: Secondary | ICD-10-CM | POA: Diagnosis not present

## 2024-02-17 DIAGNOSIS — J181 Lobar pneumonia, unspecified organism: Secondary | ICD-10-CM | POA: Diagnosis not present

## 2024-02-17 DIAGNOSIS — J9811 Atelectasis: Secondary | ICD-10-CM | POA: Diagnosis not present

## 2024-02-17 MED ORDER — CYANOCOBALAMIN 1000 MCG/ML IJ SOLN
1000.0000 ug | Freq: Once | INTRAMUSCULAR | Status: AC
  Administered 2024-02-17: 1000 ug via INTRAMUSCULAR

## 2024-02-17 NOTE — Patient Instructions (Signed)
 Medication Instructions:  Your physician recommends that you continue on your current medications as directed. Please refer to the Current Medication list given to you today.   Labwork: None today  Testing/Procedures: None today  Follow-Up: March 2026 with Dr.Ross  Any Other Special Instructions Will Be Listed Below (If Applicable).  If you need a refill on your cardiac medications before your next appointment, please call your pharmacy.

## 2024-02-17 NOTE — Telephone Encounter (Signed)
 Copied from CRM [id number]. Topic: Clinical - Medication Question >> Feb 17, 2024  3:50 PM Charlet HERO wrote: Reason for CRM: Glendia from [facility] pharmacy wanting to know the correct med for patient amLODipine  (NORVASC ) 2.5 MG tablet amLODipine  (NORVASC ) 5 MG tablet please call back with correct dosage. [phone]

## 2024-02-17 NOTE — Progress Notes (Signed)
 Patient is in office today for a nurse visit for B12 Injection. Patient Injection was given in the  Left deltoid. Patient tolerated injection well.

## 2024-02-20 ENCOUNTER — Other Ambulatory Visit: Payer: Self-pay | Admitting: Family Medicine

## 2024-02-20 ENCOUNTER — Encounter: Payer: Self-pay | Admitting: Family Medicine

## 2024-02-20 DIAGNOSIS — M4800 Spinal stenosis, site unspecified: Secondary | ICD-10-CM | POA: Diagnosis not present

## 2024-02-20 DIAGNOSIS — H04123 Dry eye syndrome of bilateral lacrimal glands: Secondary | ICD-10-CM | POA: Diagnosis not present

## 2024-02-20 DIAGNOSIS — E43 Unspecified severe protein-calorie malnutrition: Secondary | ICD-10-CM | POA: Diagnosis not present

## 2024-02-20 DIAGNOSIS — I700 Atherosclerosis of aorta: Secondary | ICD-10-CM | POA: Diagnosis not present

## 2024-02-20 DIAGNOSIS — I10 Essential (primary) hypertension: Secondary | ICD-10-CM | POA: Diagnosis not present

## 2024-02-20 DIAGNOSIS — M11211 Other chondrocalcinosis, right shoulder: Secondary | ICD-10-CM | POA: Diagnosis not present

## 2024-02-20 DIAGNOSIS — J9811 Atelectasis: Secondary | ICD-10-CM | POA: Diagnosis not present

## 2024-02-20 DIAGNOSIS — J181 Lobar pneumonia, unspecified organism: Secondary | ICD-10-CM | POA: Diagnosis not present

## 2024-02-20 DIAGNOSIS — D539 Nutritional anemia, unspecified: Secondary | ICD-10-CM | POA: Diagnosis not present

## 2024-02-20 NOTE — Telephone Encounter (Signed)
Responded to message on MyChart.

## 2024-02-21 ENCOUNTER — Other Ambulatory Visit (HOSPITAL_COMMUNITY): Payer: Self-pay | Admitting: Family Medicine

## 2024-02-21 DIAGNOSIS — Z1231 Encounter for screening mammogram for malignant neoplasm of breast: Secondary | ICD-10-CM

## 2024-02-28 DIAGNOSIS — H5213 Myopia, bilateral: Secondary | ICD-10-CM | POA: Diagnosis not present

## 2024-02-29 DIAGNOSIS — M4800 Spinal stenosis, site unspecified: Secondary | ICD-10-CM | POA: Diagnosis not present

## 2024-02-29 DIAGNOSIS — J9811 Atelectasis: Secondary | ICD-10-CM | POA: Diagnosis not present

## 2024-02-29 DIAGNOSIS — I10 Essential (primary) hypertension: Secondary | ICD-10-CM | POA: Diagnosis not present

## 2024-02-29 DIAGNOSIS — D539 Nutritional anemia, unspecified: Secondary | ICD-10-CM | POA: Diagnosis not present

## 2024-02-29 DIAGNOSIS — J181 Lobar pneumonia, unspecified organism: Secondary | ICD-10-CM | POA: Diagnosis not present

## 2024-02-29 DIAGNOSIS — E43 Unspecified severe protein-calorie malnutrition: Secondary | ICD-10-CM | POA: Diagnosis not present

## 2024-02-29 DIAGNOSIS — I700 Atherosclerosis of aorta: Secondary | ICD-10-CM | POA: Diagnosis not present

## 2024-02-29 DIAGNOSIS — H04123 Dry eye syndrome of bilateral lacrimal glands: Secondary | ICD-10-CM | POA: Diagnosis not present

## 2024-02-29 DIAGNOSIS — M11211 Other chondrocalcinosis, right shoulder: Secondary | ICD-10-CM | POA: Diagnosis not present

## 2024-03-06 DIAGNOSIS — I10 Essential (primary) hypertension: Secondary | ICD-10-CM | POA: Diagnosis not present

## 2024-03-06 DIAGNOSIS — M11211 Other chondrocalcinosis, right shoulder: Secondary | ICD-10-CM | POA: Diagnosis not present

## 2024-03-06 DIAGNOSIS — D539 Nutritional anemia, unspecified: Secondary | ICD-10-CM | POA: Diagnosis not present

## 2024-03-06 DIAGNOSIS — I700 Atherosclerosis of aorta: Secondary | ICD-10-CM | POA: Diagnosis not present

## 2024-03-06 DIAGNOSIS — J181 Lobar pneumonia, unspecified organism: Secondary | ICD-10-CM | POA: Diagnosis not present

## 2024-03-06 DIAGNOSIS — E43 Unspecified severe protein-calorie malnutrition: Secondary | ICD-10-CM | POA: Diagnosis not present

## 2024-03-06 DIAGNOSIS — J9811 Atelectasis: Secondary | ICD-10-CM | POA: Diagnosis not present

## 2024-03-06 DIAGNOSIS — M4800 Spinal stenosis, site unspecified: Secondary | ICD-10-CM | POA: Diagnosis not present

## 2024-03-06 DIAGNOSIS — H04123 Dry eye syndrome of bilateral lacrimal glands: Secondary | ICD-10-CM | POA: Diagnosis not present

## 2024-03-08 ENCOUNTER — Ambulatory Visit (HOSPITAL_COMMUNITY)
Admission: RE | Admit: 2024-03-08 | Discharge: 2024-03-08 | Disposition: A | Discharge status: Home / Self Care | Source: Ambulatory Visit | Attending: Family Medicine | Admitting: Family Medicine

## 2024-03-08 ENCOUNTER — Encounter (HOSPITAL_COMMUNITY): Payer: Self-pay

## 2024-03-08 DIAGNOSIS — Z1231 Encounter for screening mammogram for malignant neoplasm of breast: Secondary | ICD-10-CM | POA: Insufficient documentation

## 2024-03-08 DIAGNOSIS — Z780 Asymptomatic menopausal state: Secondary | ICD-10-CM | POA: Diagnosis not present

## 2024-03-08 DIAGNOSIS — M85852 Other specified disorders of bone density and structure, left thigh: Secondary | ICD-10-CM | POA: Diagnosis not present

## 2024-03-09 ENCOUNTER — Ambulatory Visit: Payer: Self-pay | Admitting: Family Medicine

## 2024-03-12 ENCOUNTER — Other Ambulatory Visit: Payer: Self-pay | Admitting: Family Medicine

## 2024-03-12 DIAGNOSIS — J9811 Atelectasis: Secondary | ICD-10-CM | POA: Diagnosis not present

## 2024-03-12 DIAGNOSIS — D539 Nutritional anemia, unspecified: Secondary | ICD-10-CM | POA: Diagnosis not present

## 2024-03-12 DIAGNOSIS — H04123 Dry eye syndrome of bilateral lacrimal glands: Secondary | ICD-10-CM | POA: Diagnosis not present

## 2024-03-12 DIAGNOSIS — E43 Unspecified severe protein-calorie malnutrition: Secondary | ICD-10-CM | POA: Diagnosis not present

## 2024-03-12 DIAGNOSIS — M11211 Other chondrocalcinosis, right shoulder: Secondary | ICD-10-CM | POA: Diagnosis not present

## 2024-03-12 DIAGNOSIS — I700 Atherosclerosis of aorta: Secondary | ICD-10-CM | POA: Diagnosis not present

## 2024-03-12 DIAGNOSIS — M4800 Spinal stenosis, site unspecified: Secondary | ICD-10-CM | POA: Diagnosis not present

## 2024-03-12 DIAGNOSIS — J181 Lobar pneumonia, unspecified organism: Secondary | ICD-10-CM | POA: Diagnosis not present

## 2024-03-12 DIAGNOSIS — I10 Essential (primary) hypertension: Secondary | ICD-10-CM | POA: Diagnosis not present

## 2024-03-14 DIAGNOSIS — E213 Hyperparathyroidism, unspecified: Secondary | ICD-10-CM | POA: Diagnosis not present

## 2024-03-16 ENCOUNTER — Ambulatory Visit (INDEPENDENT_AMBULATORY_CARE_PROVIDER_SITE_OTHER): Payer: Self-pay

## 2024-03-16 DIAGNOSIS — E538 Deficiency of other specified B group vitamins: Secondary | ICD-10-CM | POA: Diagnosis not present

## 2024-03-16 LAB — COMPREHENSIVE METABOLIC PANEL WITH GFR
ALT: 17 IU/L (ref 0–32)
AST: 21 IU/L (ref 0–40)
Albumin: 3.8 g/dL (ref 3.7–4.7)
Alkaline Phosphatase: 88 IU/L (ref 44–121)
BUN/Creatinine Ratio: 19 (ref 12–28)
BUN: 13 mg/dL (ref 8–27)
Bilirubin Total: 0.4 mg/dL (ref 0.0–1.2)
CO2: 22 mmol/L (ref 20–29)
Calcium: 10 mg/dL (ref 8.7–10.3)
Chloride: 106 mmol/L (ref 96–106)
Creatinine, Ser: 0.7 mg/dL (ref 0.57–1.00)
Globulin, Total: 2.7 g/dL (ref 1.5–4.5)
Glucose: 90 mg/dL (ref 70–99)
Potassium: 4.3 mmol/L (ref 3.5–5.2)
Sodium: 142 mmol/L (ref 134–144)
Total Protein: 6.5 g/dL (ref 6.0–8.5)
eGFR: 86 mL/min/1.73 (ref >59)

## 2024-03-16 LAB — PTH, INTACT AND CALCIUM: PTH: 22 pg/mL (ref 15–65)

## 2024-03-16 MED ORDER — CYANOCOBALAMIN 1000 MCG/ML IJ SOLN
1000.0000 ug | Freq: Once | INTRAMUSCULAR | Status: AC
  Administered 2024-03-16: 1000 ug via INTRAMUSCULAR

## 2024-03-16 NOTE — Progress Notes (Signed)
 Patient is in office today for a nurse visit for B12 Injection. Pt was given injection in right deltoid. Pt tolerated injection well.

## 2024-03-16 NOTE — Patient Instructions (Signed)
Return in 1 month for b12 injection

## 2024-03-21 ENCOUNTER — Other Ambulatory Visit: Payer: Self-pay | Admitting: Family Medicine

## 2024-03-21 DIAGNOSIS — E7800 Pure hypercholesterolemia, unspecified: Secondary | ICD-10-CM

## 2024-03-27 ENCOUNTER — Encounter: Payer: Self-pay | Admitting: Nurse Practitioner

## 2024-03-27 ENCOUNTER — Ambulatory Visit: Payer: PPO | Admitting: Nurse Practitioner

## 2024-03-27 VITALS — BP 138/80 | HR 65 | Ht 62.5 in | Wt 156.6 lb

## 2024-03-27 DIAGNOSIS — E213 Hyperparathyroidism, unspecified: Secondary | ICD-10-CM

## 2024-03-27 NOTE — Progress Notes (Signed)
 03/27/2024                 Endocrinology follow-up note   Subjective:    Patient ID: Kristen Cox, female    DOB: 1941/05/18, PCP Antonetta Rollene BRAVO, MD   Past Medical History:  Diagnosis Date   Deep vein thrombosis (DVT) (HCC) 08/2016   Right leg   Dry eyes 2010   on restasis - Dr. Darroll    Gout 2009   Hyperlipidemia    Hypertension    Spinal stenosis    Past Surgical History:  Procedure Laterality Date   CESAREAN SECTION     COLONOSCOPY  04/09/2009   MFM:Jwjo papilla otherwise, normal rectum/Left-sided diverticula/Cecal polyp status post cold snare and removal/The remainder of the colonic mucosa appeared normal. Adenomatous polyps, next TCS 03/2016 per RMR.   COLONOSCOPY N/A 05/19/2016   Procedure: COLONOSCOPY;  Surgeon: Lamar CHRISTELLA Hollingshead, MD;  Location: [facility];  Service: Endoscopy;  Laterality: N/A;  1:00 PM   Social History   Socioeconomic History   Marital status: Widowed    Spouse name: Not on file   Number of children: 2   Years of education: Not on file   Highest education level: 10th grade  Occupational History   Occupation: retired   Tobacco Use   Smoking status: Former    Current packs/day: 0.00    Average packs/day: 0.5 packs/day for 10.0 years (5.0 ttl pk-yrs)    Types: Cigarettes    Start date: 10/06/1973    Quit date: 10/07/1983    Years since quitting: 40.4   Smokeless tobacco: Never  Vaping Use   Vaping status: Never Used  Substance and Sexual Activity   Alcohol use: No   Drug use: No   Sexual activity: Not Currently    Birth control/protection: Post-menopausal  Other Topics Concern   Not on file  Social History Narrative   Not on file   Social Drivers of Health   Financial Resource Strain: Low Risk  (07/13/2023)   Overall Financial Resource Strain (CARDIA)    Difficulty of Paying Living Expenses: Not hard at all  Food Insecurity: No Food Insecurity (01/13/2024)   Hunger Vital Sign    Worried About Running Out of Food in the  Last Year: Never true    Ran Out of Food in the Last Year: Never true  Transportation Needs: No Transportation Needs (01/13/2024)   PRAPARE - Administrator, Civil Service (Medical): No    Lack of Transportation (Non-Medical): No  Physical Activity: Sufficiently Active (07/13/2023)   Exercise Vital Sign    Days of Exercise per Week: 5 days    Minutes of Exercise per Session: 30 min  Stress: No Stress Concern Present (07/13/2023)   [employer] of Occupational Health - Occupational Stress Questionnaire    Feeling of Stress : Not at all  Social Connections: Socially Isolated (01/13/2024)   Social Connection and Isolation Panel    Frequency of Communication with Friends and Family: Twice a week    Frequency of Social Gatherings with Friends and Family: Three times a week    Attends Religious Services: Never    Active Member of Clubs or Organizations: No    Attends Banker Meetings: Never    Marital Status: Widowed   Outpatient Encounter Medications as of 03/27/2024  Medication Sig   acetaminophen  (TYLENOL ) 650 MG CR tablet Take 650 mg by mouth every 8 (eight) hours as needed for pain.  amLODipine  (NORVASC ) 5 MG tablet TAKE 1 TABLET(5 MG) BY MOUTH DAILY   aspirin  EC 81 MG tablet Take 81 mg by mouth daily.   atorvastatin  (LIPITOR) 40 MG tablet TAKE 1 TABLET(40 MG) BY MOUTH DAILY   carvedilol  (COREG ) 12.5 MG tablet TAKE 1 TABLET(12.5 MG) BY MOUTH TWICE DAILY WITH A MEAL   cholecalciferol  (VITAMIN D3) 25 MCG (1000 UNIT) tablet Take 1,000 Units by mouth daily.   cyanocobalamin  1000 MCG tablet Take 1,000 mcg by mouth daily.   cyclobenzaprine  (FLEXERIL ) 5 MG tablet Take 5 mg by mouth at bedtime as needed for muscle spasms.   ezetimibe  (ZETIA ) 10 MG tablet Take 1 tablet (10 mg total) by mouth daily.   Flaxseed, Linseed, (FLAXSEED OIL) 1200 MG CAPS Take by mouth.   levocetirizine (XYZAL ) 5 MG tablet Take one tablet by mouth two times daily   olmesartan  (BENICAR ) 20 MG  tablet TAKE 1 TABLET(20 MG) BY MOUTH DAILY   Protein (PROSOURCE PO) Take 30 mLs by mouth 3 (three) times daily.   No facility-administered encounter medications on file as of 03/27/2024.   ALLERGIES: Allergies  Allergen Reactions   Amlodipine  Other (See Comments)    Excess cramping and stiffness of hands and legs   Benazepril Other (See Comments)    Patient does not recall being on this medication and/or what reaction she might have had.   Metoprolol  Other (See Comments)    CRAMPS CRAMPS CRAMPS   VACCINATION STATUS: Immunization History  Administered Date(s) Administered   Fluad Quad(high Dose 65+) 06/06/2019, 07/08/2020, 07/28/2020, 07/14/2021, 05/07/2022   Fluad Trivalent(High Dose 65+) 06/15/2023   Influenza, High Dose Seasonal PF 06/05/2020   Influenza,inj,Quad PF,6+ Mos 06/12/2014, 08/21/2015, 07/08/2016, 06/23/2017, 04/18/2018   Pneumococcal Conjugate-13 11/13/2014   Pneumococcal Polysaccharide-23 02/17/2016   Tdap 02/01/2011   Zoster Recombinant(Shingrix) 03/30/2023, 06/01/2023    HPI  83 year old female with medical history as above. She is being seen in follow-up for hypercalcemia related to mild hyperparathyroidism.  She is currently being observed only.  She has not required any surgical intervention nor pharmaceutical intervention at this time.    -Her previsit labs show serum calcium  of 10.2, stable and her PTH remains stable as well. -She is currently taking vitamin D  supplement 1000 units of vitamin D3 daily and recent Vitamin d  level was 41.2.  -She denies any new complaints other than leg cramps and arthritis.  Her BP meds are being adjusted by her PCP, has follow up with her tomorrow.   She denies any family history of parathyroid dysfunction.  She denies any hx of CVA, seizures, nephrolithiasis, nor CKD. she denies Osteoporosis. she is a former smoker.  She has not had a bone density test since 2015.   She denies any new complaints.   Review of  systems  Constitutional: + Minimally fluctuating body weight,  current Body mass index is 28.19 kg/m. , no fatigue, no subjective hyperthermia, no subjective hypothermia Eyes: no blurry vision, no xerophthalmia ENT: no sore throat, no nodules palpated in throat, no dysphagia/odynophagia, no hoarseness Cardiovascular: no chest pain, no shortness of breath, no palpitations, no leg swelling Respiratory: no cough, no shortness of breath Gastrointestinal: no nausea/vomiting/diarrhea Musculoskeletal: + muscle/joint aches- OA, leg cramps- has had several falls between visits-using walker now Skin: no rashes, no hyperemia Neurological: no tremors, no numbness, no tingling, no dizziness Psychiatric: no depression, no anxiety   Objective:    BP 138/80 (BP Location: Right Arm, Patient Position: Sitting, Cuff Size: Large)  Pulse 65   Ht 5' 2.5 (1.588 m)   Wt 156 lb 9.6 oz (71 kg)   BMI 28.19 kg/m   Wt Readings from Last 3 Encounters:  03/27/24 156 lb 9.6 oz (71 kg)  02/17/24 160 lb 6.4 oz (72.8 kg)  02/14/24 151 lb (68.5 kg)    BP Readings from Last 3 Encounters:  03/27/24 138/80  02/17/24 130/64  02/14/24 138/72     Physical Exam- Limited  Constitutional:  Body mass index is 28.19 kg/m. , not in acute distress, normal state of mind Eyes:  EOMI, no exophthalmos Musculoskeletal: using walker due to disequilibrium Skin:  no rashes, no hyperemia Neurological: no tremor with outstretched hands   Diabetic Labs (most recent): Lab Results  Component Value Date   HGBA1C 6.0 (H) 01/10/2024   HGBA1C 5.5 07/07/2021   HGBA1C 5.9 (H) 11/25/2020    Lipid Panel     Component Value Date/Time   CHOL 158 06/27/2023 0914   TRIG 128 06/27/2023 0914   HDL 40 06/27/2023 0914   CHOLHDL 4.0 06/27/2023 0914   CHOLHDL 4.4 06/06/2019 1422   VLDL 19 02/11/2016 0718   LDLCALC 95 06/27/2023 0914   LDLCALC 139 (H) 06/06/2019 1422   Recent Results (from the past 2160 hours)  CBC with  Differential/Platelet     Status: Abnormal   Collection Time: 01/10/24  9:48 AM  Result Value Ref Range   WBC 8.9 3.4 - 10.8 x10E3/uL   RBC 2.88 (L) 3.77 - 5.28 x10E6/uL   Hemoglobin 10.7 (L) 11.1 - 15.9 g/dL   Hematocrit 67.6 (L) 65.9 - 46.6 %   MCV 112 (H) 79 - 97 fL   MCH 37.2 (H) 26.6 - 33.0 pg   MCHC 33.1 31.5 - 35.7 g/dL   RDW 85.6 88.2 - 84.5 %   Platelets 608 (H) 150 - 450 x10E3/uL   Neutrophils 59 Not Estab. %   Lymphs 30 Not Estab. %   Monocytes 11 Not Estab. %   Eos 0 Not Estab. %   Basos 0 Not Estab. %   Neutrophils Absolute 5.2 1.4 - 7.0 x10E3/uL   Lymphocytes Absolute 2.7 0.7 - 3.1 x10E3/uL   Monocytes Absolute 1.0 (H) 0.1 - 0.9 x10E3/uL   EOS (ABSOLUTE) 0.0 0.0 - 0.4 x10E3/uL   Basophils Absolute 0.0 0.0 - 0.2 x10E3/uL   Immature Granulocytes 0 Not Estab. %   Immature Grans (Abs) 0.0 0.0 - 0.1 x10E3/uL  CMP14+EGFR     Status: None   Collection Time: 01/10/24  9:48 AM  Result Value Ref Range   Glucose 91 70 - 99 mg/dL   BUN 11 8 - 27 mg/dL   Creatinine, Ser 9.28 0.57 - 1.00 mg/dL   eGFR 85 >40 fO/fpw/8.26   BUN/Creatinine Ratio 15 12 - 28   Sodium 139 134 - 144 mmol/L   Potassium 5.0 3.5 - 5.2 mmol/L   Chloride 103 96 - 106 mmol/L   CO2 22 20 - 29 mmol/L   Calcium  10.3 8.7 - 10.3 mg/dL   Total Protein 6.8 6.0 - 8.5 g/dL   Albumin 3.9 3.7 - 4.7 g/dL   Globulin, Total 2.9 1.5 - 4.5 g/dL   Bilirubin Total 0.4 0.0 - 1.2 mg/dL   Alkaline Phosphatase 100 44 - 121 IU/L   AST 22 0 - 40 IU/L   ALT 16 0 - 32 IU/L  TSH     Status: None   Collection Time: 01/10/24  9:48 AM  Result Value Ref Range  TSH 1.520 0.450 - 4.500 uIU/mL  VITAMIN D  25 Hydroxy (Vit-D Deficiency, Fractures)     Status: None   Collection Time: 01/10/24  9:48 AM  Result Value Ref Range   Vit D, 25-Hydroxy 50.6 30.0 - 100.0 ng/mL    Comment: Vitamin D  deficiency has been defined by the Institute of Medicine and an Endocrine Society practice guideline as a level of serum 25-OH vitamin D  less  than 20 ng/mL (1,2). The Endocrine Society went on to further define vitamin D  insufficiency as a level between 21 and 29 ng/mL (2). 1. IOM (Institute of Medicine). 2010. Dietary reference    intakes for calcium  and D. [place]: The    [employer]. 2. Holick MF, Binkley [place], Bischoff-Ferrari HA, et al.    Evaluation, treatment, and prevention of vitamin D     deficiency: an Endocrine Society clinical practice    guideline. JCEM. 2011 Jul; 96(7):1911-30.   HgB A1c     Status: Abnormal   Collection Time: 01/10/24  9:48 AM  Result Value Ref Range   Hgb A1c MFr Bld 6.0 (H) 4.8 - 5.6 %    Comment:          Prediabetes: 5.7 - 6.4          Diabetes: >6.4          Glycemic control for adults with diabetes: <7.0    Est. average glucose Bld gHb Est-mCnc 126 mg/dL  A87 and Folate Panel     Status: None   Collection Time: 01/10/24  9:48 AM  Result Value Ref Range   Vitamin B-12 CANCELED pg/mL    Comment: Test not performed. We have received your request for additional testing. We are not able to add the test(s) requested.  Result canceled by the ancillary.    Folate CANCELED ng/mL    Comment: Test not performed. We have received your request for additional testing. We are not able to add the test(s) requested. A serum folate concentration of less than 3.1 ng/mL is considered to represent clinical deficiency.  Result canceled by the ancillary.   Iron     Status: None   Collection Time: 01/10/24  9:48 AM  Result Value Ref Range   Iron CANCELED ug/dL    Comment: Test not performed. We have received your request for additional testing. We are not able to add the test(s) requested.  Result canceled by the ancillary.   Ferritin     Status: None   Collection Time: 01/10/24  9:48 AM  Result Value Ref Range   Ferritin CANCELED ng/mL    Comment: Test not performed. We have received your request for additional testing. We are not able to add the test(s)  requested.  Result canceled by the ancillary.   Specimen status report     Status: None   Collection Time: 01/10/24  9:48 AM  Result Value Ref Range   specimen status report Comment     Comment: Written Authorization Written Authorization Written Authorization Received. Authorization received from Rollene Pesa 01-13-2024 Logged by Joesph Ion   Troponin I (High Sensitivity)     Status: Abnormal   Collection Time: 01/13/24 10:41 AM  Result Value Ref Range   Troponin I (High Sensitivity) 54 (H) <18 ng/L    Comment: (NOTE) Elevated high sensitivity troponin I (hsTnI) values and significant  changes across serial measurements may suggest ACS but many other  chronic and acute conditions are known to elevate hsTnI results.  Refer to  the Links section for chest pain algorithms and additional  guidance. Performed at [facility], [address], [place], [place] [address]   CBC with Differential     Status: Abnormal   Collection Time: 01/13/24 10:41 AM  Result Value Ref Range   WBC 13.6 (H) 4.0 - 10.5 K/uL   RBC 2.84 (L) 3.87 - 5.11 MIL/uL   Hemoglobin 10.6 (L) 12.0 - 15.0 g/dL   HCT 68.8 (L) 63.9 - 53.9 %   MCV 109.5 (H) 80.0 - 100.0 fL   MCH 37.3 (H) 26.0 - 34.0 pg   MCHC 34.1 30.0 - 36.0 g/dL   RDW 85.0 88.4 - 84.4 %   Platelets 481 (H) 150 - 400 K/uL   nRBC 0.2 0.0 - 0.2 %   Neutrophils Relative % 70 %   Neutro Abs 9.6 (H) 1.7 - 7.7 K/uL   Lymphocytes Relative 17 %   Lymphs Abs 2.3 0.7 - 4.0 K/uL   Monocytes Relative 13 %   Monocytes Absolute 1.7 (H) 0.1 - 1.0 K/uL   Eosinophils Relative 0 %   Eosinophils Absolute 0.0 0.0 - 0.5 K/uL   Basophils Relative 0 %   Basophils Absolute 0.0 0.0 - 0.1 K/uL   Immature Granulocytes 0 %   Abs Immature Granulocytes 0.05 0.00 - 0.07 K/uL    Comment: Performed at [facility], [address] [place], [place] [address]  Vitamin B12     Status: Abnormal   Collection Time: 01/13/24 10:41 AM  Result Value Ref Range   Vitamin  B-12 51 (L) 180 - 914 pg/mL    Comment: (NOTE) This assay is not validated for testing neonatal or myeloproliferative syndrome specimens for Vitamin B12 levels. Performed at [facility], [address], [place], [place] [address]   Folate     Status: None   Collection Time: 01/13/24 10:41 AM  Result Value Ref Range   Folate 19.3 >5.9 ng/mL    Comment: Performed at [facility], [address], [place], [place] [address]  Iron and TIBC     Status: None   Collection Time: 01/13/24 10:41 AM  Result Value Ref Range   Iron 61 28 - 170 ug/dL   TIBC 749 749 - 549 ug/dL   Saturation Ratios 24 10.4 - 31.8 %   UIBC 189 ug/dL    Comment: Performed at [facility], [address], [place], [place] [address]  Ferritin     Status: None   Collection Time: 01/13/24 10:41 AM  Result Value Ref Range   Ferritin 163 11 - 307 ng/mL    Comment: Performed at [facility], [address] [place], [place] [address]  Reticulocytes     Status: Abnormal   Collection Time: 01/13/24 10:41 AM  Result Value Ref Range   Retic Ct Pct 3.4 (H) 0.4 - 3.1 %   RBC. 2.82 (L) 3.87 - 5.11 MIL/uL   Retic Count, Absolute 94.8 19.0 - 186.0 K/uL   Immature Retic Fract 17.3 (H) 2.3 - 15.9 %    Comment: Performed at [facility], [address], [place], [place] [address]  CK     Status: Abnormal   Collection Time: 01/13/24 11:32 AM  Result Value Ref Range   Total CK 2,007 (H) 38 - 234 U/L    Comment: Performed at [facility], [address], [place], [place] [address]  Comprehensive metabolic panel with GFR     Status: Abnormal   Collection Time: 01/13/24 11:32 AM  Result Value  Ref Range   Sodium 135 135 - 145 mmol/L   Potassium 3.5 3.5 - 5.1 mmol/L   Chloride 104 98 - 111 mmol/L   CO2 24 22 - 32 mmol/L   Glucose, Bld 106 (H) 70 - 99 mg/dL    Comment: Glucose reference range applies only to samples taken after fasting for at least 8 hours.   BUN 17 8 - 23 mg/dL   Creatinine, Ser 9.33 0.44 - 1.00 mg/dL   Calcium  9.7  8.9 - 10.3 mg/dL   Total Protein 7.1 6.5 - 8.1 g/dL   Albumin 3.4 (L) 3.5 - 5.0 g/dL   AST 74 (H) 15 - 41 U/L   ALT 61 (H) 0 - 44 U/L   Alkaline Phosphatase 71 38 - 126 U/L   Total Bilirubin 0.6 0.0 - 1.2 mg/dL   GFR, Estimated >39 >39 mL/min    Comment: (NOTE) Calculated using the CKD-EPI Creatinine Equation (2021)    Anion gap 7 5 - 15    Comment: Performed at [facility], [address], [place], [place] [address]  Urinalysis, Routine w reflex microscopic -Urine, Clean Catch     Status: Abnormal   Collection Time: 01/13/24 11:39 AM  Result Value Ref Range   Color, Urine STRAW (A) YELLOW   APPearance CLEAR CLEAR   Specific Gravity, Urine 1.008 1.005 - 1.030   pH 6.0 5.0 - 8.0   Glucose, UA NEGATIVE NEGATIVE mg/dL   Hgb urine dipstick NEGATIVE NEGATIVE   Bilirubin Urine NEGATIVE NEGATIVE   Ketones, ur NEGATIVE NEGATIVE mg/dL   Protein, ur NEGATIVE NEGATIVE mg/dL   Nitrite NEGATIVE NEGATIVE   Leukocytes,Ua TRACE (A) NEGATIVE   RBC / HPF 0-5 0 - 5 RBC/hpf   WBC, UA 0-5 0 - 5 WBC/hpf   Bacteria, UA NONE SEEN NONE SEEN   Squamous Epithelial / HPF 0-5 0 - 5 /HPF    Comment: Performed at [facility], [address], [place], [place] [address]  Troponin I (High Sensitivity)     Status: Abnormal   Collection Time: 01/13/24 12:26 PM  Result Value Ref Range   Troponin I (High Sensitivity) 48 (H) <18 ng/L    Comment: (NOTE) Elevated high sensitivity troponin I (hsTnI) values and significant  changes across serial measurements may suggest ACS but many other  chronic and acute conditions are known to elevate hsTnI results.  Refer to the Links section for chest pain algorithms and additional  guidance. Performed at [facility], [address] [place], [place] [address]   Legionella Pneumophila Serogp 1 Ur Ag     Status: None   Collection Time: 01/13/24  5:55 PM  Result Value Ref Range   L. pneumophila Serogp 1 Ur Ag Negative Negative    Comment: (NOTE) Presumptive negative for  L. pneumophila serogroup 1 antigen in urine, suggesting no recent or current infection. Legionnaires' disease cannot be ruled out since other serogroups and species may also cause disease. Performed At: [facility] [address] [place], [place] [id number] Jennette Shorter MD Ey:[id number]    Source of Sample URINE, CLEAN CATCH     Comment: Performed at [facility], [address] [place], [place] [address]  Strep pneumoniae urinary antigen     Status: None   Collection Time: 01/13/24  5:55 PM  Result Value Ref Range   Strep Pneumo Urinary Antigen NEGATIVE NEGATIVE    Comment:        Infection due to S. pneumoniae cannot be absolutely ruled out  since the antigen present may be below the detection limit of the test. Performed at [facility], [address] [place], [place] [address]   CK     Status: Abnormal   Collection Time: 01/14/24  4:14 AM  Result Value Ref Range   Total CK 1,503 (H) 38 - 234 U/L    Comment: Performed at [facility], [address], [place], [place] [address]  Basic metabolic panel     Status: Abnormal   Collection Time: 01/14/24  4:14 AM  Result Value Ref Range   Sodium 138 135 - 145 mmol/L   Potassium 3.3 (L) 3.5 - 5.1 mmol/L   Chloride 109 98 - 111 mmol/L   CO2 26 22 - 32 mmol/L   Glucose, Bld 98 70 - 99 mg/dL    Comment: Glucose reference range applies only to samples taken after fasting for at least 8 hours.   BUN 18 8 - 23 mg/dL   Creatinine, Ser 9.43 0.44 - 1.00 mg/dL   Calcium  8.9 8.9 - 10.3 mg/dL   GFR, Estimated >39 >39 mL/min    Comment: (NOTE) Calculated using the CKD-EPI Creatinine Equation (2021)    Anion gap 3 (L) 5 - 15    Comment: Performed at [facility], [address], [place], [place] [address]  CBC     Status: Abnormal   Collection Time: 01/14/24  4:14 AM  Result Value Ref Range   WBC 11.6 (H) 4.0 - 10.5 K/uL   RBC 2.56 (L) 3.87 - 5.11 MIL/uL   Hemoglobin 9.4 (L) 12.0 - 15.0 g/dL   HCT 71.6 (L) 63.9 - 53.9  %   MCV 110.5 (H) 80.0 - 100.0 fL   MCH 36.7 (H) 26.0 - 34.0 pg   MCHC 33.2 30.0 - 36.0 g/dL   RDW 84.8 88.4 - 84.4 %   Platelets 429 (H) 150 - 400 K/uL   nRBC 0.3 (H) 0.0 - 0.2 %    Comment: Performed at [facility], [address], [place], [place] [address]  Resp panel by RT-PCR (RSV, Flu A&B, Covid) Anterior Nasal Swab     Status: None   Collection Time: 01/14/24  7:18 AM   Specimen: Anterior Nasal Swab  Result Value Ref Range   SARS Coronavirus 2 by RT PCR NEGATIVE NEGATIVE    Comment: (NOTE) SARS-CoV-2 target nucleic acids are NOT DETECTED.  The SARS-CoV-2 RNA is generally detectable in upper respiratory specimens during the acute phase of infection. The lowest concentration of SARS-CoV-2 viral copies this assay can detect is 138 copies/mL. A negative result does not preclude SARS-Cov-2 infection and should not be used as the sole basis for treatment or other patient management decisions. A negative result may occur with  improper specimen collection/handling, submission of specimen other than nasopharyngeal swab, presence of viral mutation(s) within the areas targeted by this assay, and inadequate number of viral copies(<138 copies/mL). A negative result must be combined with clinical observations, patient history, and epidemiological information. The expected result is Negative.  Fact Sheet for Patients:  BloggerCourse.com  Fact Sheet for Healthcare Providers:  SeriousBroker.it  This test is no t yet approved or cleared by the [place]  FDA and  has been authorized for detection and/or diagnosis of SARS-CoV-2 by FDA under an Emergency Use Authorization (EUA). This EUA will remain  in effect (meaning this test can be used) for the duration of the COVID-19 declaration under Section 564(b)(1) of the Act, 21 U.S.C.section 360bbb-3(b)(1), unless the authorization  is terminated  or revoked sooner.       Influenza A by  PCR NEGATIVE NEGATIVE   Influenza B by PCR NEGATIVE NEGATIVE    Comment: (NOTE) The Xpert Xpress SARS-CoV-2/FLU/RSV plus assay is intended as an aid in the diagnosis of influenza from Nasopharyngeal swab specimens and should not be used as a sole basis for treatment. Nasal washings and aspirates are unacceptable for Xpert Xpress SARS-CoV-2/FLU/RSV testing.  Fact Sheet for Patients: BloggerCourse.com  Fact Sheet for Healthcare Providers: SeriousBroker.it  This test is not yet approved or cleared by the [place]  FDA and has been authorized for detection and/or diagnosis of SARS-CoV-2 by FDA under an Emergency Use Authorization (EUA). This EUA will remain in effect (meaning this test can be used) for the duration of the COVID-19 declaration under Section 564(b)(1) of the Act, 21 U.S.C. section 360bbb-3(b)(1), unless the authorization is terminated or revoked.     Resp Syncytial Virus by PCR NEGATIVE NEGATIVE    Comment: (NOTE) Fact Sheet for Patients: BloggerCourse.com  Fact Sheet for Healthcare Providers: SeriousBroker.it  This test is not yet approved or cleared by the [place]  FDA and has been authorized for detection and/or diagnosis of SARS-CoV-2 by FDA under an Emergency Use Authorization (EUA). This EUA will remain in effect (meaning this test can be used) for the duration of the COVID-19 declaration under Section 564(b)(1) of the Act, 21 U.S.C. section 360bbb-3(b)(1), unless the authorization is terminated or revoked.  Performed at [facility], [address] [place], [place] [address]   Magnesium      Status: Abnormal   Collection Time: 01/14/24  7:20 AM  Result Value Ref Range   Magnesium  1.6 (L) 1.7 - 2.4 mg/dL    Comment: Performed at [facility], [address] [place], [place] [address]  CBC     Status: Abnormal   Collection Time: 01/15/24  3:03 AM   Result Value Ref Range   WBC 14.6 (H) 4.0 - 10.5 K/uL   RBC 2.96 (L) 3.87 - 5.11 MIL/uL   Hemoglobin 11.2 (L) 12.0 - 15.0 g/dL   HCT 66.2 (L) 63.9 - 53.9 %   MCV 113.9 (H) 80.0 - 100.0 fL   MCH 37.8 (H) 26.0 - 34.0 pg   MCHC 33.2 30.0 - 36.0 g/dL   RDW 84.3 (H) 88.4 - 84.4 %   Platelets 369 150 - 400 K/uL   nRBC 0.3 (H) 0.0 - 0.2 %    Comment: Performed at [facility], [address], [place], [place] [address]  Comprehensive metabolic panel     Status: Abnormal   Collection Time: 01/15/24  3:03 AM  Result Value Ref Range   Sodium 133 (L) 135 - 145 mmol/L   Potassium 4.7 3.5 - 5.1 mmol/L    Comment: HEMOLYSIS AT THIS LEVEL MAY AFFECT RESULT   Chloride 106 98 - 111 mmol/L   CO2 23 22 - 32 mmol/L   Glucose, Bld 114 (H) 70 - 99 mg/dL    Comment: Glucose reference range applies only to samples taken after fasting for at least 8 hours.   BUN 13 8 - 23 mg/dL   Creatinine, Ser 9.43 0.44 - 1.00 mg/dL   Calcium  8.9 8.9 - 10.3 mg/dL   Total Protein 6.1 (L) 6.5 - 8.1 g/dL   Albumin 2.9 (L) 3.5 - 5.0 g/dL   AST 41 15 - 41 U/L    Comment: HEMOLYSIS AT THIS LEVEL MAY AFFECT RESULT   ALT 39 0 - 44  U/L    Comment: HEMOLYSIS AT THIS LEVEL MAY AFFECT RESULT   Alkaline Phosphatase 70 38 - 126 U/L   Total Bilirubin 1.0 0.0 - 1.2 mg/dL    Comment: HEMOLYSIS AT THIS LEVEL MAY AFFECT RESULT   GFR, Estimated >60 >60 mL/min    Comment: (NOTE) Calculated using the CKD-EPI Creatinine Equation (2021)    Anion gap 4 (L) 5 - 15    Comment: Performed at [facility], [address] [place], [place] [address]  CK     Status: Abnormal   Collection Time: 01/15/24  3:03 AM  Result Value Ref Range   Total CK 846 (H) 38 - 234 U/L    Comment: HEMOLYSIS AT THIS LEVEL MAY AFFECT RESULT Performed at [facility], [address], [place], [place] [address]   Magnesium      Status: None   Collection Time: 01/15/24  3:03 AM  Result Value Ref Range   Magnesium  1.8 1.7 - 2.4 mg/dL    Comment: Performed at [facility], [address], [place], [place] [address]  Culture, blood (Routine X 2) w Reflex to ID Panel     Status: None   Collection Time: 01/15/24  8:10 AM   Specimen: BLOOD  Result Value Ref Range   Specimen Description BLOOD RIGHT ANTECUBITAL    Special Requests AEROBIC BOTTLE ONLY Blood Culture adequate volume    Culture      NO GROWTH 5 DAYS Performed at [facility], [address], [place], [place] [address]    Report Status 01/20/2024 FINAL   Culture, blood (Routine X 2) w Reflex to ID Panel     Status: None   Collection Time: 01/15/24  8:10 AM   Specimen: BLOOD  Result Value Ref Range   Specimen Description BLOOD BLOOD RIGHT ARM    Special Requests AEROBIC BOTTLE ONLY Blood Culture adequate volume    Culture      NO GROWTH 5 DAYS Performed at [facility], [address], [place], [place] [address]    Report Status 01/20/2024 FINAL   Procalcitonin     Status: None   Collection Time: 01/15/24  8:10 AM  Result Value Ref Range   Procalcitonin <0.10 ng/mL    Comment:        Interpretation: PCT (Procalcitonin) <= 0.5 ng/mL: Systemic infection (sepsis) is not likely. Local bacterial infection is possible. (NOTE)       Sepsis PCT Algorithm           Lower Respiratory Tract                                      Infection PCT Algorithm    ----------------------------     ----------------------------         PCT < 0.25 ng/mL                PCT < 0.10 ng/mL          Strongly encourage             Strongly discourage   discontinuation of antibiotics    initiation of antibiotics    ----------------------------     -----------------------------       PCT 0.25 - 0.50 ng/mL            PCT 0.10 - 0.25 ng/mL               OR       >  80% decrease in PCT            Discourage initiation of                                            antibiotics      Encourage discontinuation           of antibiotics    ----------------------------     -----------------------------         PCT >= 0.50  ng/mL              PCT 0.26 - 0.50 ng/mL               AND        <80% decrease in PCT             Encourage initiation of                                             antibiotics       Encourage continuation           of antibiotics    ----------------------------     -----------------------------        PCT >= 0.50 ng/mL                  PCT > 0.50 ng/mL               AND         increase in PCT                  Strongly encourage                                      initiation of antibiotics    Strongly encourage escalation           of antibiotics                                     -----------------------------                                           PCT <= 0.25 ng/mL                                                 OR                                        > 80% decrease in PCT                                      Discontinue / Do not initiate  antibiotics  Performed at [facility], [address] [place], [place] [address]   CBC     Status: Abnormal   Collection Time: 01/16/24  6:52 AM  Result Value Ref Range   WBC 14.9 (H) 4.0 - 10.5 K/uL   RBC 2.82 (L) 3.87 - 5.11 MIL/uL   Hemoglobin 10.4 (L) 12.0 - 15.0 g/dL   HCT 68.1 (L) 63.9 - 53.9 %   MCV 112.8 (H) 80.0 - 100.0 fL   MCH 36.9 (H) 26.0 - 34.0 pg   MCHC 32.7 30.0 - 36.0 g/dL   RDW 84.3 (H) 88.4 - 84.4 %   Platelets 309 150 - 400 K/uL   nRBC 0.2 0.0 - 0.2 %    Comment: Performed at [facility], [address] [place], [place] [address]  CK     Status: Abnormal   Collection Time: 01/16/24  6:52 AM  Result Value Ref Range   Total CK 285 (H) 38 - 234 U/L    Comment: Performed at [facility], [address], [place], [place] [address]  Basic metabolic panel with GFR     Status: Abnormal   Collection Time: 01/16/24  6:52 AM  Result Value Ref Range   Sodium 132 (L) 135 - 145 mmol/L   Potassium 4.3 3.5 - 5.1 mmol/L   Chloride 97 (L) 98 - 111 mmol/L   CO2 21 (L) 22 - 32  mmol/L   Glucose, Bld 96 70 - 99 mg/dL    Comment: Glucose reference range applies only to samples taken after fasting for at least 8 hours.   BUN 15 8 - 23 mg/dL   Creatinine, Ser 9.43 0.44 - 1.00 mg/dL   Calcium  8.8 (L) 8.9 - 10.3 mg/dL   GFR, Estimated >39 >39 mL/min    Comment: (NOTE) Calculated using the CKD-EPI Creatinine Equation (2021)    Anion gap 14 5 - 15    Comment: Performed at [facility], [address] [place], [place] [address]  CBC     Status: Abnormal   Collection Time: 01/17/24  4:51 AM  Result Value Ref Range   WBC 15.4 (H) 4.0 - 10.5 K/uL   RBC 2.42 (L) 3.87 - 5.11 MIL/uL   Hemoglobin 8.9 (L) 12.0 - 15.0 g/dL   HCT 73.5 (L) 63.9 - 53.9 %   MCV 109.1 (H) 80.0 - 100.0 fL   MCH 36.8 (H) 26.0 - 34.0 pg   MCHC 33.7 30.0 - 36.0 g/dL   RDW 84.6 88.4 - 84.4 %   Platelets 346 150 - 400 K/uL   nRBC 0.2 0.0 - 0.2 %    Comment: Performed at [facility], [address], [place], [place] [address]  CK     Status: None   Collection Time: 01/17/24  4:51 AM  Result Value Ref Range   Total CK 109 38 - 234 U/L    Comment: Performed at [facility], [address] [place], [place] [address]  CBC     Status: Abnormal   Collection Time: 01/18/24  4:38 AM  Result Value Ref Range   WBC 14.5 (H) 4.0 - 10.5 K/uL   RBC 2.35 (L) 3.87 - 5.11 MIL/uL   Hemoglobin 8.6 (L) 12.0 - 15.0 g/dL   HCT 74.0 (L) 63.9 - 53.9 %   MCV 110.2 (H) 80.0 - 100.0 fL   MCH 36.6 (H) 26.0 - 34.0 pg   MCHC 33.2 30.0 - 36.0 g/dL   RDW 84.2 (H) 88.4 - 84.4 %  Platelets 357 150 - 400 K/uL   nRBC 0.3 (H) 0.0 - 0.2 %    Comment: Performed at [facility], [address] [place], [place] [address]  Basic metabolic panel with GFR     Status: Abnormal   Collection Time: 01/18/24  4:38 AM  Result Value Ref Range   Sodium 135 135 - 145 mmol/L   Potassium 4.3 3.5 - 5.1 mmol/L   Chloride 106 98 - 111 mmol/L   CO2 25 22 - 32 mmol/L   Glucose, Bld 120 (H) 70 - 99 mg/dL    Comment: Glucose reference range  applies only to samples taken after fasting for at least 8 hours.   BUN 26 (H) 8 - 23 mg/dL   Creatinine, Ser 9.43 0.44 - 1.00 mg/dL   Calcium  9.3 8.9 - 10.3 mg/dL   GFR, Estimated >39 >39 mL/min    Comment: (NOTE) Calculated using the CKD-EPI Creatinine Equation (2021)    Anion gap 4 (L) 5 - 15    Comment: Performed at [facility], [address], [place], [place] [address]  Cytology - Non PAP;     Status: None   Collection Time: 01/18/24  1:42 PM  Result Value Ref Range   CYTOLOGY - NON GYN      CYTOLOGY - NON PAP CASE: [id number] PATIENT: Kristen Cox Non-Gynecological Cytology Report     Clinical History: Fall, weakness Specimen Submitted:  A. SYNOVIAL FLUID, RIGHT KNEE   FINAL MICROSCOPIC DIAGNOSIS: - No malignant cells identified - Acute inflammation  SPECIMEN ADEQUACY: Satisfactory but limited for evaluation, scant cellularity  GROSS: Received is/are 13 ccs of golden yellow fluid. (TS:ts) Prepared: Smears:  0 Concentration Method (Thin Prep):  1 Cell Block:  1 Additional Studies:  n/a     Final Diagnosis performed by Pepper Dutton, MD.   Electronically signed 01/20/2024 Technical component performed at [facility], [address] [place], [place] [address].  Professional component performed at [employer]: [address], [address], [place], [place] [address].  Immunohistochemistry Technical component (if applicable) was performed at [facility]. [address], [place], [place] [address].   IMMUNOHISTOCHEMISTRY DISCLAIMER (if applicable): Some of these immunohistochemical stains may have been developed and the performance characteristics determine by [facility]. Some may not have been cleared or approved by the U.S. Food and Drug Administration. The FDA has determined that such clearance or approval is not necessary. This test is used for clinical purposes. It should not be regarded  as investigational or for research. This laboratory is certified under the Clinical Laboratory Improvement Amendments of 1988 (CLIA-88) as qualified to perform high complexity clinical laboratory testing.  The controls stained appropriately.   IHC stains are performed on formalin fixed, paraffin embedded tissue using a 3,3diaminobenzidine (DAB) chromogen and Leica Bond Autostainer System. The staining intensity of the nucleus is score manually and is reported as the percentage of tumor cell nuclei demonstrating specific nuclear staining. The specimens ar e fixed in 10% Neutral Formalin for at least 6 hours and up to 72hrs. These tests are validated on decalcified tissue. Results should be interpreted with caution given the possibility of false negative results on decalcified specimens. Antibody Clones are as follows ER-clone 105F, PR-clone 16, Ki67- clone MM1. Some of these immunohistochemical stains may have been developed and the performance characteristics determined by [facility].   Fungus Culture Without Stain     Status: None   Collection Time: 01/18/24  2:00 PM   Specimen: KNEE; Other  Result Value Ref Range   Specimen Description      KNEE Performed at [facility], [address] [place], [place] [address]    Special Requests      SYNOVIAL Performed at [facility], [address] [place], [place] [address]    Culture      NO FUNGUS ISOLATED AFTER 21 DAYS Performed at [facility], [address], [place], [place] [address]    Report Status 02/08/2024 FINAL   Synovial cell count + diff, w/ crystals     Status: Abnormal   Collection Time: 01/18/24  2:00 PM  Result Value Ref Range   Color, Synovial YELLOW (A) YELLOW   Appearance-Synovial TURBID (A) CLEAR   Crystals, Fluid INTRACELLULAR CALCIUM  PYROPHOSPHATE CRYSTALS    WBC, Synovial 4,020 (H) 0 - 200 /cu mm   Neutrophil, Synovial 92 (H) 0 - 25 %   Lymphocytes-Synovial Fld 4 0 - 20 %    Monocyte-Macrophage-Synovial Fluid 4 (L) 50 - 90 %    Comment: Performed at [facility], [address], [place], [place] [address]  Body fluid culture w Gram Stain     Status: None   Collection Time: 01/18/24  2:00 PM   Specimen: Synovium  Result Value Ref Range   Specimen Description SYNOVIAL    Special Requests RIGHT KNEE    Gram Stain      RARE WBC PRESENT, PREDOMINANTLY PMN NO ORGANISMS SEEN    Culture      NO GROWTH 3 DAYS Performed at [facility], [address], [place], [place] [address]    Report Status 01/21/2024 FINAL   Anaerobic culture w Gram Stain     Status: None   Collection Time: 01/18/24  2:00 PM   Specimen: Synovium  Result Value Ref Range   Specimen Description SYNOVIAL    Special Requests RIGHT KNEE    Gram Stain      FEW WBC PRESENT, PREDOMINANTLY PMN NO ORGANISMS SEEN    Culture      NO ANAEROBES ISOLATED Performed at [facility], [address] [place], [place] [address]    Report Status 01/23/2024 FINAL   CBC     Status: Abnormal   Collection Time: 01/19/24  4:56 AM  Result Value Ref Range   WBC 10.5 4.0 - 10.5 K/uL   RBC 2.39 (L) 3.87 - 5.11 MIL/uL   Hemoglobin 8.8 (L) 12.0 - 15.0 g/dL   HCT 73.9 (L) 63.9 - 53.9 %   MCV 108.8 (H) 80.0 - 100.0 fL   MCH 36.8 (H) 26.0 - 34.0 pg   MCHC 33.8 30.0 - 36.0 g/dL   RDW 84.5 88.4 - 84.4 %   Platelets 376 150 - 400 K/uL   nRBC 0.6 (H) 0.0 - 0.2 %    Comment: Performed at [facility], [address] [place], [place] [address]  Basic metabolic panel with GFR     Status: Abnormal   Collection Time: 01/19/24  4:56 AM  Result Value Ref Range   Sodium 133 (L) 135 - 145 mmol/L   Potassium 4.1 3.5 - 5.1 mmol/L   Chloride 103 98 - 111 mmol/L   CO2 25 22 - 32 mmol/L   Glucose, Bld 99 70 - 99 mg/dL    Comment: Glucose reference range applies only to samples taken after fasting for at least 8 hours.   BUN 22 8 - 23 mg/dL  Creatinine, Ser 0.49 0.44 - 1.00 mg/dL   Calcium  9.4 8.9 - 10.3 mg/dL    GFR, Estimated >39 >39 mL/min    Comment: (NOTE) Calculated using the CKD-EPI Creatinine Equation (2021)    Anion gap 5 5 - 15    Comment: Performed at [facility], [address] [place], [place] [address]  Magnesium      Status: None   Collection Time: 01/19/24  4:56 AM  Result Value Ref Range   Magnesium  1.7 1.7 - 2.4 mg/dL    Comment: Performed at [facility], [address] [place], [place] [address]  CBC with Differential     Status: Abnormal   Collection Time: 02/14/24 11:46 AM  Result Value Ref Range   WBC 7.9 3.4 - 10.8 x10E3/uL   RBC 3.23 (L) 3.77 - 5.28 x10E6/uL   Hemoglobin 11.3 11.1 - 15.9 g/dL   Hematocrit 65.1 65.9 - 46.6 %   MCV 108 (H) 79 - 97 fL   MCH 35.0 (H) 26.6 - 33.0 pg   MCHC 32.5 31.5 - 35.7 g/dL   RDW 86.3 88.2 - 84.5 %   Platelets 427 150 - 450 x10E3/uL   Neutrophils 44 Not Estab. %   Lymphs 41 Not Estab. %   Monocytes 15 Not Estab. %   Eos 0 Not Estab. %   Basos 0 Not Estab. %   Neutrophils Absolute 3.5 1.4 - 7.0 x10E3/uL   Lymphocytes Absolute 3.2 (H) 0.7 - 3.1 x10E3/uL   Monocytes Absolute 1.2 (H) 0.1 - 0.9 x10E3/uL   EOS (ABSOLUTE) 0.0 0.0 - 0.4 x10E3/uL   Basophils Absolute 0.0 0.0 - 0.2 x10E3/uL   Immature Granulocytes 0 Not Estab. %   Immature Grans (Abs) 0.0 0.0 - 0.1 x10E3/uL  CMP14+EGFR     Status: Abnormal   Collection Time: 02/14/24 11:46 AM  Result Value Ref Range   Glucose 113 (H) 70 - 99 mg/dL   BUN 17 8 - 27 mg/dL   Creatinine, Ser 9.28 0.57 - 1.00 mg/dL   eGFR 85 >40 fO/fpw/8.26   BUN/Creatinine Ratio 24 12 - 28   Sodium 141 134 - 144 mmol/L   Potassium 5.3 (H) 3.5 - 5.2 mmol/L   Chloride 105 96 - 106 mmol/L   CO2 23 20 - 29 mmol/L   Calcium  10.8 (H) 8.7 - 10.3 mg/dL   Total Protein 7.0 6.0 - 8.5 g/dL   Albumin 3.9 3.7 - 4.7 g/dL   Globulin, Total 3.1 1.5 - 4.5 g/dL   Bilirubin Total 0.4 0.0 - 1.2 mg/dL   Alkaline Phosphatase 95 44 - 121 IU/L   AST 20 0 - 40 IU/L   ALT 20 0 - 32 IU/L  Comprehensive metabolic panel      Status: None   Collection Time: 03/14/24  8:20 AM  Result Value Ref Range   Glucose 90 70 - 99 mg/dL   BUN 13 8 - 27 mg/dL   Creatinine, Ser 9.29 0.57 - 1.00 mg/dL   eGFR 86 >40 fO/fpw/8.26   BUN/Creatinine Ratio 19 12 - 28   Sodium 142 134 - 144 mmol/L   Potassium 4.3 3.5 - 5.2 mmol/L   Chloride 106 96 - 106 mmol/L   CO2 22 20 - 29 mmol/L   Calcium  10.0 8.7 - 10.3 mg/dL   Total Protein 6.5 6.0 - 8.5 g/dL   Albumin 3.8 3.7 - 4.7 g/dL   Globulin, Total 2.7 1.5 - 4.5 g/dL   Bilirubin Total 0.4 0.0 - 1.2 mg/dL  Alkaline Phosphatase 88 44 - 121 IU/L   AST 21 0 - 40 IU/L   ALT 17 0 - 32 IU/L  PTH, intact and calcium      Status: None   Collection Time: 03/14/24  8:20 AM  Result Value Ref Range   PTH 22 15 - 65 pg/mL   PTH Interp Comment     Comment: Interpretation                 Intact PTH    Calcium                                  (pg/mL)      (mg/dL) Normal                          15 - 65     8.6 - 10.2 Primary Hyperparathyroidism         >65          >10.2 Secondary Hyperparathyroidism       >65          <10.2 Non-Parathyroid Hypercalcemia       <65          >10.2 Hypoparathyroidism                  <15          < 8.6 Non-Parathyroid Hypocalcemia    15 - 65          < 8.6      Latest Reference Range & Units 03/14/24 08:20  Comprehensive metabolic panel with GFR  Rpt  Sodium 134 - 144 mmol/L 142  Potassium 3.5 - 5.2 mmol/L 4.3  Chloride 96 - 106 mmol/L 106  CO2 20 - 29 mmol/L 22  Glucose 70 - 99 mg/dL 90  BUN 8 - 27 mg/dL 13  Creatinine 9.42 - 8.99 mg/dL 9.29  Calcium  8.7 - 10.3 mg/dL 89.9  BUN/Creatinine Ratio 12 - 28  19  eGFR >59 mL/min/1.73 86  Alkaline Phosphatase 44 - 121 IU/L 88  Albumin 3.7 - 4.7 g/dL 3.8  AST 0 - 40 IU/L 21  ALT 0 - 32 IU/L 17  Total Protein 6.0 - 8.5 g/dL 6.5  Total Bilirubin 0.0 - 1.2 mg/dL 0.4  Globulin, Total 1.5 - 4.5 g/dL 2.7  PTH, Intact 15 - 65 pg/mL 22  PTH Interp  Comment  Rpt: View report in Results Review for more  information  Assessment & Plan:   1. Hypercalcemia- mild primary hyperparathyroidism Her repeat labs have been reviewed with her.  Her PTH-rp was < 2.0 previously.   Her calcium  and PTH levels were once again stable.    -She will not require any intervention at this time, neither surgical or pharmaceutical.  We will repeat PTH and calcium  levels (also vitamin D , magnesium , phosphorous and CMP) prior to next visit in 1 year.  - I discussed the potential complications of untreated hypercalcemia.  She is benefiting from continued vitamin D  supplement, currently 1000 units of vitamin D3 daily.  Advised her to take maintenance dose of vitamin D3 1000 PO units daily.  -She was previously documented to have normal bone density.  She will be considered for Sensipar treatment if calcium  levels become and remain elevated at subsequent visits.  - I advised patient to maintain close follow up with Antonetta Rollene BRAVO, MD for primary care needs.  I spent  15  minutes in the care of the patient today including review of labs from Thyroid  Function, CMP, and other relevant labs ; imaging/biopsy records (current and previous including abstractions from other facilities); face-to-face time discussing  her lab results and symptoms, medications doses, her options of short and long term treatment based on the latest standards of care / guidelines;   and documenting the encounter.  Kristen HERO Terada  participated in the discussions, expressed understanding, and voiced agreement with the above plans.  All questions were answered to her satisfaction. she is encouraged to contact clinic should she have any questions or concerns prior to her return visit.   Follow up plan: Return in about 1 year (around 03/27/2025) for hypercalcemia follow up with previsit labs.  Benton Rio, [facility] [address] [place], [place] [address] Phone: [phone] Fax:  [phone]   03/27/2024, 9:18 AM

## 2024-04-13 ENCOUNTER — Other Ambulatory Visit: Payer: Self-pay | Admitting: Family Medicine

## 2024-04-18 ENCOUNTER — Ambulatory Visit (INDEPENDENT_AMBULATORY_CARE_PROVIDER_SITE_OTHER)

## 2024-04-18 DIAGNOSIS — E538 Deficiency of other specified B group vitamins: Secondary | ICD-10-CM | POA: Diagnosis not present

## 2024-04-18 MED ORDER — CYANOCOBALAMIN 1000 MCG/ML IJ SOLN
1000.0000 ug | Freq: Once | INTRAMUSCULAR | Status: AC
  Administered 2024-04-18: 1000 ug via INTRAMUSCULAR

## 2024-04-18 NOTE — Progress Notes (Unsigned)
 Patient is in office today for a nurse visit for B12 Injection. Patient Injection was given in the  Right deltoid. Patient tolerated injection well.

## 2024-04-24 ENCOUNTER — Ambulatory Visit (INDEPENDENT_AMBULATORY_CARE_PROVIDER_SITE_OTHER): Admitting: Family Medicine

## 2024-04-24 ENCOUNTER — Encounter: Payer: Self-pay | Admitting: Family Medicine

## 2024-04-24 VITALS — BP 138/72 | HR 83 | Resp 18 | Ht 62.5 in | Wt 160.0 lb

## 2024-04-24 DIAGNOSIS — E782 Mixed hyperlipidemia: Secondary | ICD-10-CM

## 2024-04-24 DIAGNOSIS — J309 Allergic rhinitis, unspecified: Secondary | ICD-10-CM

## 2024-04-24 DIAGNOSIS — E213 Hyperparathyroidism, unspecified: Secondary | ICD-10-CM

## 2024-04-24 DIAGNOSIS — I10 Essential (primary) hypertension: Secondary | ICD-10-CM | POA: Diagnosis not present

## 2024-04-24 DIAGNOSIS — M170 Bilateral primary osteoarthritis of knee: Secondary | ICD-10-CM | POA: Diagnosis not present

## 2024-04-24 DIAGNOSIS — J3089 Other allergic rhinitis: Secondary | ICD-10-CM

## 2024-04-24 DIAGNOSIS — R058 Other specified cough: Secondary | ICD-10-CM

## 2024-04-24 DIAGNOSIS — Z23 Encounter for immunization: Secondary | ICD-10-CM | POA: Insufficient documentation

## 2024-04-24 MED ORDER — AMLODIPINE BESYLATE 5 MG PO TABS: 5.0000 mg | ORAL_TABLET | Freq: Every day | ORAL | 11 refills | Status: DC

## 2024-04-24 MED ORDER — EZETIMIBE 10 MG PO TABS: 10.0000 mg | ORAL_TABLET | Freq: Every day | ORAL | 3 refills | Status: DC

## 2024-04-24 MED ORDER — OLMESARTAN MEDOXOMIL 20 MG PO TABS: 20.0000 mg | ORAL_TABLET | Freq: Every day | ORAL | 11 refills | Status: DC

## 2024-04-24 MED ORDER — CARVEDILOL 12.5 MG PO TABS: 12.5000 mg | ORAL_TABLET | Freq: Two times a day (BID) | ORAL | 11 refills | Status: DC

## 2024-04-24 MED ORDER — MONTELUKAST SODIUM 10 MG PO TABS: 10.0000 mg | ORAL_TABLET | Freq: Every day | ORAL | 5 refills | Status: DC

## 2024-04-24 NOTE — Assessment & Plan Note (Signed)
 After obtaining informed consent, the influenza vaccine is  administered , with no adverse effect noted at the time of administration.

## 2024-04-24 NOTE — Assessment & Plan Note (Signed)
Controlled and managed by Endo 

## 2024-04-24 NOTE — Assessment & Plan Note (Signed)
 Controlled, no change in medication DASH diet and commitment to daily physical activity for a minimum of 30 minutes discussed and encouraged, as a part of hypertension management. The importance of attaining a healthy weight is also discussed.     04/24/2024   11:01 AM 04/24/2024   10:38 AM 03/27/2024    9:08 AM 02/17/2024   11:10 AM 02/14/2024   11:31 AM 02/14/2024   11:08 AM 02/03/2024    8:14 AM  BP/Weight  Systolic BP 138 165 138 130 138 149 118  Diastolic BP 72 89 80 64 72 76 72  Wt. (Lbs)  160 156.6 160.4  151 165  BMI  28.8 kg/m2 28.19 kg/m2 28.87 kg/m2  26.75 kg/m2 29.23 kg/m2

## 2024-04-24 NOTE — Assessment & Plan Note (Signed)
 Uncontrolled add bedtime singulair 

## 2024-04-24 NOTE — Patient Instructions (Addendum)
 Annual exam in 4 months  Flu vaccine today  Need TdAP at your pharmacy also  New for allergies is montelukast  one at befdtime, stay on xyzal   Blood pressure is good  Fasting lipid, cmp and EGFr 3 to 5 days before next appointment  Thanks for choosing [facility], we consider it a privelige to serve you.

## 2024-04-24 NOTE — Progress Notes (Signed)
 Kristen Cox     MRN: [id number]      DOB: [birth year]  Chief Complaint  Patient presents with   Hypertension    10 week follow up     HPI [name] is here for follow up and re-evaluation of chronic medical conditions, medication management and review of any available recent lab and radiology data.  Preventive health is updated, specifically  Cancer screening and Immunization.   Questions or concerns regarding consultations or procedures which the PT has had in the interim are  addressed. The PT denies any adverse reactions to current medications since the last visit.  C/o nose constantly running and coughing esp at night, takes xyzal  5mg  twice daily No fever , chills or discolored sputum production Appetite good with weight re gain Blood pressure at home seldom over 130, had episode with a systoloic of 88, not light headed, states next morning back to normal range  ROS Denies recent fever or chills. Denies sinus pressure, nasal congestion, ear pain or sore throat. Denies chest congestion, productive cough or wheezing. Denies chest pains, palpitations and leg swelling Denies abdominal pain, nausea, vomiting,diarrhea or constipation.   Denies dysuria, frequency, hesitancy or incontinence. C/o pain in left ankle and knees . Denies headaches, seizures, numbness, or tingling. Denies depression, anxiety or insomnia. Denies skin break down or rash.   PE  BP 138/72   Pulse 83   Resp 18   Ht 5' 2.5 (1.588 m)   Wt 160 lb (72.6 kg)   SpO2 94%   BMI 28.80 kg/m   Patient alert and oriented and in no cardiopulmonary distress.  HEENT: No facial asymmetry, EOMI,     Neck supple .  Chest: Clear to auscultation bilaterally.  CVS: S1, S2 no murmurs, no S3.Regular rate.  ABD: Soft non tender.   Ext: No edema  MS: Adequate though reduced  ROM spine, shoulders, hips and knees.  Skin: Intact, no ulcerations or rash noted.  Psych: Good eye contact, normal affect. Memory  intact not anxious or depressed appearing.  CNS: CN 2-12 intact, power,  normal throughout.no focal deficits noted.   Assessment & Plan  Essential hypertension Controlled, no change in medication DASH diet and commitment to daily physical activity for a minimum of 30 minutes discussed and encouraged, as a part of hypertension management. The importance of attaining a healthy weight is also discussed.     04/24/2024   11:01 AM 04/24/2024   10:38 AM 03/27/2024    9:08 AM 02/17/2024   11:10 AM 02/14/2024   11:31 AM 02/14/2024   11:08 AM 02/03/2024    8:14 AM  BP/Weight  Systolic BP 138 165 138 130 138 149 118  Diastolic BP 72 89 80 64 72 76 72  Wt. (Lbs)  160 156.6 160.4  151 165  BMI  28.8 kg/m2 28.19 kg/m2 28.87 kg/m2  26.75 kg/m2 29.23 kg/m2       Hyperlipemia Hyperlipidemia:Low fat diet discussed and encouraged.   Lipid Panel  Lab Results  Component Value Date   CHOL 158 06/27/2023   HDL 40 06/27/2023   LDLCALC 95 06/27/2023   TRIG 128 06/27/2023   CHOLHDL 4.0 06/27/2023     Updated lab needed at/ before next visit.   Allergic rhinitis Uncontrolled add bedtime singulair   Chronic non-seasonal allergic rhinitis Uncontrolled add bedtime singulair   Hyperparathyroidism (HCC) Controlled and managed by Endo  Arthritis of both knees Stable uses topical preps and tylenol  as needed  Immunization due After  obtaining informed consent, the influenza vaccine is  administered , with no adverse effect noted at the time of administration.

## 2024-04-24 NOTE — Assessment & Plan Note (Signed)
 Hyperlipidemia:Low fat diet discussed and encouraged.   Lipid Panel  Lab Results  Component Value Date   CHOL 158 06/27/2023   HDL 40 06/27/2023   LDLCALC 95 06/27/2023   TRIG 128 06/27/2023   CHOLHDL 4.0 06/27/2023     Updated lab needed at/ before next visit.

## 2024-04-24 NOTE — Assessment & Plan Note (Signed)
 Stable uses topical preps and tylenol  as needed

## 2024-05-18 ENCOUNTER — Telehealth: Payer: Self-pay

## 2024-05-18 NOTE — Telephone Encounter (Signed)
 Copied from CRM [id number]. Topic: Clinical - Medication Question >> May 18, 2024  3:08 PM Charlet HERO wrote: Reason for CRM: Patient is calling about medication she states that she was on 5 mg but it is down 2.5 mg. She wants to know if she should stop the 5 mg and just take the 2.5 of her amLODipine  (NORVASC ) 5 MG tablet. Please reach out to patient with answer.

## 2024-05-18 NOTE — Telephone Encounter (Signed)
 Spoke to pt and let her know to take just the 5mg 

## 2024-05-21 ENCOUNTER — Other Ambulatory Visit: Payer: Self-pay

## 2024-05-21 ENCOUNTER — Telehealth: Payer: Self-pay | Admitting: Family Medicine

## 2024-05-21 ENCOUNTER — Ambulatory Visit (INDEPENDENT_AMBULATORY_CARE_PROVIDER_SITE_OTHER)

## 2024-05-21 DIAGNOSIS — R5383 Other fatigue: Secondary | ICD-10-CM | POA: Diagnosis not present

## 2024-05-21 DIAGNOSIS — E7800 Pure hypercholesterolemia, unspecified: Secondary | ICD-10-CM

## 2024-05-21 MED ORDER — ATORVASTATIN CALCIUM 40 MG PO TABS: 40.0000 mg | ORAL_TABLET | Freq: Every day | ORAL | 3 refills | Status: AC

## 2024-05-21 MED ORDER — CARVEDILOL 12.5 MG PO TABS: 12.5000 mg | ORAL_TABLET | Freq: Two times a day (BID) | ORAL | 11 refills | Status: AC

## 2024-05-21 MED ORDER — CYANOCOBALAMIN 1000 MCG/ML IJ SOLN
1000.0000 ug | Freq: Once | INTRAMUSCULAR | Status: AC
  Administered 2024-05-21: 1000 ug via INTRAMUSCULAR

## 2024-05-21 MED ORDER — MONTELUKAST SODIUM 10 MG PO TABS: 10.0000 mg | ORAL_TABLET | Freq: Every day | ORAL | 5 refills | Status: AC

## 2024-05-21 MED ORDER — OLMESARTAN MEDOXOMIL 20 MG PO TABS: 20.0000 mg | ORAL_TABLET | Freq: Every day | ORAL | 11 refills | Status: AC

## 2024-05-21 MED ORDER — LEVOCETIRIZINE DIHYDROCHLORIDE 5 MG PO TABS: ORAL_TABLET | ORAL | 5 refills | Status: AC

## 2024-05-21 MED ORDER — AMLODIPINE BESYLATE 5 MG PO TABS: 5.0000 mg | ORAL_TABLET | Freq: Every day | ORAL | 11 refills | Status: AC

## 2024-05-21 MED ORDER — EZETIMIBE 10 MG PO TABS: 10.0000 mg | ORAL_TABLET | Freq: Every day | ORAL | 3 refills | Status: AC

## 2024-05-21 NOTE — Progress Notes (Signed)
 Patient is in office today for a nurse visit for B12 Injection. Patient Injection was given in the  Left deltoid. Patient tolerated injection well.

## 2024-05-21 NOTE — Telephone Encounter (Signed)
 Patient needs to change pharmacy to  [facility]

## 2024-05-21 NOTE — Telephone Encounter (Signed)
 Updated chart. Sent new scripts to [facility]

## 2024-06-18 ENCOUNTER — Ambulatory Visit: Payer: Self-pay

## 2024-06-18 NOTE — Telephone Encounter (Signed)
 Patient reports left hand swelling since last Wednesday. Reports 10 out of 10 pain. Patient is requesting to be seen in the office today. Doesn't want to go to any other office. Requesting a call back today.  FYI Only or Action Required?: Action required by provider: request for appointment.  Patient was last seen in primary care on 04/24/2024 by Antonetta Rollene BRAVO, MD.  Called Nurse Triage reporting Hand Pain.  Symptoms began last Wednesday.  Interventions attempted: Rest, hydration, or home remedies.  Symptoms are: unchanged.  Triage Disposition: See HCP Within 4 Hours (Or PCP Triage)  Patient/caregiver understands and will follow disposition?: Yes  Copied from CRM [id number]. Topic: Clinical - Red Word Triage >> Jun 18, 2024  8:58 AM Laymon HERO wrote: Red Word that prompted transfer to Nurse Triage: Left hand swollen since last Wednesday. Very painful. Reason for Disposition  [1] SEVERE pain (e.g., excruciating, unable to use hand at all) AND [2] not improved after 2 hours of pain medicine  Answer Assessment - Initial Assessment Questions 1. ONSET: When did the pain start?     Started last Wednesday 2. LOCATION: Where is the pain located?     Left hand 3. PAIN: How bad is the pain? (Scale 1-10; or mild, moderate, severe)     10 4. WORK OR EXERCISE: Has there been any recent work or exercise that involved this part (i.e., hand or wrist) of the body?     no 5. CAUSE: What do you think is causing the pain?     unsure 6. AGGRAVATING FACTORS: What makes the pain worse? (e.g., using computer)     Patient couldn't say. 7. OTHER SYMPTOMS: Do you have any other symptoms? (e.g., fever, neck pain, numbness or tingling, rash, swelling)     Hand swelling  Protocols used: Hand Pain-A-AH

## 2024-06-19 ENCOUNTER — Ambulatory Visit
Admission: EM | Admit: 2024-06-19 | Discharge: 2024-06-19 | Disposition: A | Discharge status: Home / Self Care | Attending: Family Medicine | Admitting: Family Medicine

## 2024-06-19 ENCOUNTER — Other Ambulatory Visit: Payer: Self-pay

## 2024-06-19 ENCOUNTER — Encounter: Payer: Self-pay | Admitting: Emergency Medicine

## 2024-06-19 DIAGNOSIS — M79642 Pain in left hand: Secondary | ICD-10-CM | POA: Diagnosis not present

## 2024-06-19 DIAGNOSIS — R600 Localized edema: Secondary | ICD-10-CM | POA: Diagnosis not present

## 2024-06-19 MED ORDER — DEXAMETHASONE SOD PHOSPHATE PF 10 MG/ML IJ SOLN
10.0000 mg | Freq: Once | INTRAMUSCULAR | Status: AC
  Administered 2024-06-19: 10 mg via INTRAMUSCULAR

## 2024-06-19 NOTE — Telephone Encounter (Signed)
 Pt. Called back, states no one called me back from yesterday. No available appointments today, pt. Will go to UC. Declines to be seen in another office.

## 2024-06-19 NOTE — ED Triage Notes (Addendum)
 Pt reports left hand pain and swelling since last Wednesday. Pt denies any known injury. pt reports hx of gout and arthritis. Has taken tylenol  yesterday with no change in discomfort. Called pcp yesterday and did not have available appointment.

## 2024-06-19 NOTE — Discharge Instructions (Signed)
 We have given you a steroid shot today for pain and inflammation.  You may continue all of the good home care that you have been doing with warm Epsom salt soaks, elevation, Voltaren gel as needed to the area, Tylenol .

## 2024-06-19 NOTE — ED Provider Notes (Signed)
 [facility]    CSN: [id number] Arrival date & time: 06/19/24  0859      History   Chief Complaint Chief Complaint  Patient presents with   Hand Pain    HPI Kristen Cox is a 83 y.o. female.   Patient presenting today with left hand diffuse pain, swelling worse at the wrist since 5 days ago.  Denies any known injury, fevers, chills, body aches, nausea, vomiting, discoloration.  Does have a history of gout and arthritis, states this feels similar.  So far trying Tylenol , Voltaren gel, Epsom salt soaks, ice, elevation with minimal relief.    Past Medical History:  Diagnosis Date   Deep vein thrombosis (DVT) (HCC) 08/2016   Right leg   Dry eyes 2010   on restasis - Dr. Darroll    Gout 2009   Hyperlipidemia    Hypertension    Pneumonia 01/13/2024   Spinal stenosis     Patient Active Problem List   Diagnosis Date Noted   Allergic cough 04/24/2024   Allergic rhinitis 04/24/2024   Immunization due 04/24/2024   Hospital discharge follow-up 02/14/2024   Polyarthralgia 02/02/2024   Neurocognitive deficits 01/23/2024   Pseudogout of knee, right 01/20/2024   Vitamin B 12 deficiency 01/20/2024   Severe protein-calorie malnutrition 01/20/2024   Rhabdomyolysis 01/13/2024   Lobar pneumonia 01/13/2024   Neck pain 01/10/2024   Aortic valve calcification 04/04/2023   Aortic atherosclerosis 04/04/2023   Coronary atherosclerosis 04/04/2023   Muscle cramps 02/16/2023   Osteoarthritis of both knees 09/27/2022   Mediastinal mass 09/14/2022   Left anterior knee pain 09/14/2022   H/O falling 04/04/2022   Achilles tendon disorder 07/14/2021   Arthritis of both knees 01/20/2021   Palpitations 07/10/2020   Overweight (BMI 25.0-29.9) 04/10/2020   Vitamin D  deficiency 12/19/2019   Nocturnal leg cramps 06/09/2019   Myositis of multiple sites 09/12/2018   Left leg pain 11/08/2017   Shoulder pain, right 11/06/2017   Hyperparathyroidism 04/21/2017   History of colonic  polyps 05/06/2016   Hypercalcemia 11/19/2013   Prediabetes 04/29/2010   Hyperlipemia 02/13/2010   Gout 01/27/2010   Essential hypertension 01/27/2010   Chronic non-seasonal allergic rhinitis 01/27/2010    Past Surgical History:  Procedure Laterality Date   CESAREAN SECTION     COLONOSCOPY  04/09/2009   MFM:Jwjo papilla otherwise, normal rectum/Left-sided diverticula/Cecal polyp status post cold snare and removal/The remainder of the colonic mucosa appeared normal. Adenomatous polyps, next TCS 03/2016 per RMR.   COLONOSCOPY N/A 05/19/2016   Procedure: COLONOSCOPY;  Surgeon: Lamar CHRISTELLA Hollingshead, MD;  Location[facility];  Service: Endoscopy;  Laterality: N/A;  1:00 PM    OB History     Gravida  2   Para  2   Term      Preterm      AB      Living  2      SAB      IAB      Ectopic      Multiple      Live Births               Home Medications    Prior to Admission medications   Medication Sig Start Date End Date Taking? Authorizing Provider  acetaminophen  (TYLENOL ) 650 MG CR tablet Take 650 mg by mouth every 8 (eight) hours as needed for pain.    [provider]  amLODipine  (NORVASC ) 5 MG tablet Take 1 tablet (5 mg total) by mouth  daily. 05/21/24   Antonetta Rollene BRAVO, MD  aspirin  EC 81 MG tablet Take 81 mg by mouth daily.    [provider]  atorvastatin  (LIPITOR) 40 MG tablet Take 1 tablet (40 mg total) by mouth daily. 05/21/24   Antonetta Rollene BRAVO, MD  carvedilol  (COREG ) 12.5 MG tablet Take 1 tablet (12.5 mg total) by mouth 2 (two) times daily with a meal. 05/21/24   Antonetta Rollene BRAVO, MD  cholecalciferol  (VITAMIN D3) 25 MCG (1000 UNIT) tablet Take 1,000 Units by mouth daily.    [provider]  cyanocobalamin  1000 MCG tablet Take 1,000 mcg by mouth daily. 02/05/24   [provider]  ezetimibe  (ZETIA ) 10 MG tablet Take 1 tablet (10 mg total) by mouth daily. 05/21/24 08/19/24  Antonetta Rollene BRAVO, MD  Flaxseed, Linseed,  (FLAXSEED OIL) 1200 MG CAPS Take by mouth.    [provider]  levocetirizine (XYZAL ) 5 MG tablet Take one tablet by mouth two times daily 05/21/24   Antonetta Rollene BRAVO, MD  montelukast  (SINGULAIR ) 10 MG tablet Take 1 tablet (10 mg total) by mouth at bedtime. 05/21/24   Antonetta Rollene BRAVO, MD  olmesartan  (BENICAR ) 20 MG tablet Take 1 tablet (20 mg total) by mouth daily. 05/21/24   Antonetta Rollene BRAVO, MD    Family History Family History  Problem Relation Age of Onset   Stroke Mother    Stroke Father    Diabetes Sister    Pulmonary embolism Sister    Cirrhosis Brother    Colon cancer Neg Hx     Social History Social History   Tobacco Use   Smoking status: Former    Current packs/day: 0.00    Average packs/day: 0.5 packs/day for 10.0 years (5.0 ttl pk-yrs)    Types: Cigarettes    Start date: 10/06/1973    Quit date: 10/07/1983    Years since quitting: 40.7   Smokeless tobacco: Never  Vaping Use   Vaping status: Never Used  Substance Use Topics   Alcohol use: No   Drug use: No     Allergies   Amlodipine , Benazepril, and Metoprolol    Review of Systems Review of Systems Per HPI  Physical Exam Triage Vital Signs ED Triage Vitals  Encounter Vitals Group     BP 06/19/24 0921 (!) 149/72     Girls Systolic BP Percentile --      Girls Diastolic BP Percentile --      Boys Systolic BP Percentile --      Boys Diastolic BP Percentile --      Pulse Rate 06/19/24 0921 81     Resp 06/19/24 0921 20     Temp 06/19/24 0921 98.1 F (36.7 C)     Temp Source 06/19/24 0921 Oral     SpO2 06/19/24 0921 95 %     Weight --      Height --      Head Circumference --      Peak Flow --      Pain Score 06/19/24 0922 7     Pain Loc --      Pain Education --      Exclude from Growth Chart --    No data found.  Updated Vital Signs BP (!) 149/72 (BP Location: Right Arm)   Pulse 81   Temp 98.1 F (36.7 C) (Oral)   Resp 20   SpO2 95%   Visual Acuity Right Eye Distance:    Left Eye Distance:   Bilateral Distance:  Right Eye Near:   Left Eye Near:    Bilateral Near:     Physical Exam Vitals and nursing note reviewed.  Constitutional:      Appearance: Normal appearance. She is not ill-appearing.  HENT:     Head: Atraumatic.     Mouth/Throat:     Mouth: Mucous membranes are moist.  Eyes:     Extraocular Movements: Extraocular movements intact.     Conjunctiva/sclera: Conjunctivae normal.  Cardiovascular:     Rate and Rhythm: Normal rate.  Pulmonary:     Effort: Pulmonary effort is normal.  Musculoskeletal:        General: Swelling and tenderness present. Normal range of motion.     Cervical back: Normal range of motion and neck supple.     Comments: Diffuse edema, tenderness to palpation to the left hand and wrist.  Range of motion intact  Skin:    General: Skin is warm and dry.     Findings: No bruising or erythema.  Neurological:     Mental Status: She is alert and oriented to person, place, and time.     Comments: Left upper extremity neurovascularly intact  Psychiatric:        Mood and Affect: Mood normal.        Thought Content: Thought content normal.        Judgment: Judgment normal.      UC Treatments / Results  Labs (all labs ordered are listed, but only abnormal results are displayed) Labs Reviewed - No data to display  EKG   Radiology No results found.  Procedures Procedures (including critical care time)  Medications Ordered in UC Medications  dexamethasone  (DECADRON ) injection 10 mg (10 mg Intramuscular Given 06/19/24 1020)    Initial Impression / Assessment and Plan / UC Course  I have reviewed the triage vital signs and the nursing notes.  Pertinent labs & imaging results that were available during my care of the patient were reviewed by me and considered in my medical decision making (see chart for details).     Suspect gout or other similar inflammatory cause.  Treat with IM Decadron , Epsom salt soaks,  elevation, Voltaren gel, Tylenol  as needed.  Follow-up for worsening or unresolving symptoms.  Final Clinical Impressions(s) / UC Diagnoses   Final diagnoses:  Hand edema  Left hand pain     Discharge Instructions      We have given you a steroid shot today for pain and inflammation.  You may continue all of the good home care that you have been doing with warm Epsom salt soaks, elevation, Voltaren gel as needed to the area, Tylenol .    ED Prescriptions   None    PDMP not reviewed this encounter.   Stuart Vernell Norris, [place] 06/19/24 1601

## 2024-06-26 DIAGNOSIS — E669 Obesity, unspecified: Secondary | ICD-10-CM | POA: Diagnosis not present

## 2024-06-26 DIAGNOSIS — J302 Other seasonal allergic rhinitis: Secondary | ICD-10-CM | POA: Diagnosis not present

## 2024-06-26 DIAGNOSIS — Z8249 Family history of ischemic heart disease and other diseases of the circulatory system: Secondary | ICD-10-CM | POA: Diagnosis not present

## 2024-06-26 DIAGNOSIS — I10 Essential (primary) hypertension: Secondary | ICD-10-CM | POA: Diagnosis not present

## 2024-06-26 DIAGNOSIS — K5900 Constipation, unspecified: Secondary | ICD-10-CM | POA: Diagnosis not present

## 2024-06-26 DIAGNOSIS — I2510 Atherosclerotic heart disease of native coronary artery without angina pectoris: Secondary | ICD-10-CM | POA: Diagnosis not present

## 2024-06-26 DIAGNOSIS — Z9989 Dependence on other enabling machines and devices: Secondary | ICD-10-CM | POA: Diagnosis not present

## 2024-06-26 DIAGNOSIS — Z7982 Long term (current) use of aspirin: Secondary | ICD-10-CM | POA: Diagnosis not present

## 2024-06-26 DIAGNOSIS — Z87891 Personal history of nicotine dependence: Secondary | ICD-10-CM | POA: Diagnosis not present

## 2024-06-26 DIAGNOSIS — E785 Hyperlipidemia, unspecified: Secondary | ICD-10-CM | POA: Diagnosis not present

## 2024-06-26 DIAGNOSIS — G8929 Other chronic pain: Secondary | ICD-10-CM | POA: Diagnosis not present

## 2024-07-17 ENCOUNTER — Ambulatory Visit

## 2024-07-17 VITALS — BP 120/60 | HR 73 | Resp 12 | Ht 62.5 in | Wt 140.0 lb

## 2024-07-17 DIAGNOSIS — Z0000 Encounter for general adult medical examination without abnormal findings: Secondary | ICD-10-CM

## 2024-07-17 NOTE — Progress Notes (Signed)
 Chief Complaint  Patient presents with   Medicare Wellness     Subjective:   Kristen Cox is a 83 y.o. female who presents for a Medicare Annual Wellness Visit.  Allergies (verified) Amlodipine , Benazepril, and Metoprolol    History: Past Medical History:  Diagnosis Date   Deep vein thrombosis (DVT) (HCC) 08/2016   Right leg   Dry eyes 2010   on restasis - Dr. Darroll    Gout 2009   Hyperlipidemia    Hypertension    Pneumonia 01/13/2024   Spinal stenosis    Past Surgical History:  Procedure Laterality Date   CESAREAN SECTION     COLONOSCOPY  04/09/2009   MFM:Jwjo papilla otherwise, normal rectum/Left-sided diverticula/Cecal polyp status post cold snare and removal/The remainder of the colonic mucosa appeared normal. Adenomatous polyps, next TCS 03/2016 per RMR.   COLONOSCOPY N/A 05/19/2016   Procedure: COLONOSCOPY;  Surgeon: Lamar CHRISTELLA Hollingshead, MD;  Location: [facility];  Service: Endoscopy;  Laterality: N/A;  1:00 PM   Family History  Problem Relation Age of Onset   Stroke Mother    Stroke Father    Diabetes Sister    Pulmonary embolism Sister    Cirrhosis Brother    Colon cancer Neg Hx    Social History   Occupational History   Occupation: retired   Tobacco Use   Smoking status: Former    Current packs/day: 0.00    Average packs/day: 0.5 packs/day for 10.0 years (5.0 ttl pk-yrs)    Types: Cigarettes    Start date: 10/06/1973    Quit date: 10/07/1983    Years since quitting: 40.8   Smokeless tobacco: Never  Vaping Use   Vaping status: Never Used  Substance and Sexual Activity   Alcohol use: No   Drug use: No   Sexual activity: Not Currently    Birth control/protection: Post-menopausal   Tobacco Counseling Counseling given: Not Answered  SDOH Screenings   Food Insecurity: No Food Insecurity (07/17/2024)  Housing: Unknown (07/17/2024)  Transportation Needs: No Transportation Needs (07/17/2024)  Utilities: Not At Risk (07/17/2024)  Alcohol Screen:  Low Risk  (01/20/2022)  Depression (PHQ2-9): Low Risk  (07/17/2024)  Recent Concern: Depression (PHQ2-9) - Medium Risk (04/24/2024)  Financial Resource Strain: Low Risk  (07/13/2023)  Physical Activity: Sufficiently Active (07/17/2024)  Social Connections: Socially Isolated (07/17/2024)  Stress: No Stress Concern Present (07/17/2024)  Tobacco Use: Medium Risk (07/17/2024)  Health Literacy: Adequate Health Literacy (07/17/2024)   See flowsheets for full screening details  Depression Screen PHQ 2 & 9 Depression Scale- Over the past 2 weeks, how often have you been bothered by any of the following problems? Little interest or pleasure in doing things: 0 Feeling down, depressed, or hopeless (PHQ Adolescent also includes...irritable): 0 PHQ-2 Total Score: 0 Trouble falling or staying asleep, or sleeping too much: 0 Feeling tired or having little energy: 2 Poor appetite or overeating (PHQ Adolescent also includes...weight loss): 0 Feeling bad about yourself - or that you are a failure or have let yourself or your family down: 0 Trouble concentrating on things, such as reading the newspaper or watching television (PHQ Adolescent also includes...like school work): 0 Moving or speaking so slowly that other people could have noticed. Or the opposite - being so fidgety or restless that you have been moving around a lot more than usual: 0 Thoughts that you would be better off dead, or of hurting yourself in some way: 0 PHQ-9 Total Score: 7 If you checked off  any problems, how difficult have these problems made it for you to do your work, take care of things at home, or get along with other people?: Not difficult at all     Goals Addressed               This Visit's Progress     I'd like to have less arthritis pain (pt-stated)         Visit info / Clinical Intake: Medicare Wellness Visit Type:: Subsequent Annual Wellness Visit Persons participating in visit:: patient Medicare Wellness  Visit Mode:: In-person (required for WTM) Information given by:: patient Interpreter Needed?: No Pre-visit prep was completed: yes AWV questionnaire completed by patient prior to visit?: no Living arrangements:: with family/others Patient's Overall Health Status Rating: (!) fair Typical amount of pain: some Does pain affect daily life?: no Are you currently prescribed opioids?: no  Dietary Habits and Nutritional Risks How many meals a day?: 2 Eats fruit and vegetables daily?: yes Most meals are obtained by: eating out In the last 2 weeks, have you had any of the following?: none Diabetic:: no  Functional Status Activities of Daily Living (to include ambulation/medication): (!) Needs Assist Feeding: Independent Dressing/Grooming: Independent Bathing: Independent Toileting: Independent Transfer: Independent with device- listed below Ambulation: Independent with device- listed below Home Assistive Devices/Equipment: Walker (specify Type) Medication Administration: Independent Home Management: Needs assistance (comment) Manage your own finances?: yes Primary transportation is: driving Concerns about vision?: no *vision screening is required for WTM* Concerns about hearing?: no  Fall Screening Falls in the past year?: 1 Number of falls in past year: 0 Was there an injury with Fall?: 1 Fall Risk Category Calculator: 2 Patient Fall Risk Level: Moderate Fall Risk  Fall Risk Patient at Risk for Falls Due to: History of fall(s); Impaired balance/gait; Impaired mobility; Orthopedic patient Fall risk Follow up: Falls evaluation completed; Education provided; Falls prevention discussed  Home and Transportation Safety: All rugs have non-skid backing?: yes All stairs or steps have railings?: yes Grab bars in the bathtub or shower?: yes Have non-skid surface in bathtub or shower?: yes (patient has a shower chair) Good home lighting?: yes Regular seat belt use?: yes Hospital stays  in the last year:: (!) yes How many hospital stays:: 1 Reason: fall  Cognitive Assessment Difficulty concentrating, remembering, or making decisions? : no Will 6CIT or Mini Cog be Completed: no 6CIT or Mini Cog Declined: patient alert, oriented, able to answer questions appropriately and recall recent events  Advance Directives (For Healthcare) Does Patient Have a Medical Advance Directive?: No Would patient like information on creating a medical advance directive?: Yes (MAU/Ambulatory/Procedural Areas - Information given)  Reviewed/Updated  Reviewed/Updated: Reviewed All (Medical, Surgical, Family, Medications, Allergies, Care Teams, Patient Goals)        Objective:    Today's Vitals   07/17/24 1336  BP: 120/60  Pulse: 73  Resp: 12  SpO2: 97%  Weight: 140 lb (63.5 kg)  Height: 5' 2.5 (1.588 m)   Body mass index is 25.2 kg/m.  Current Medications (verified) Outpatient Encounter Medications as of 07/17/2024  Medication Sig   acetaminophen  (TYLENOL ) 650 MG CR tablet Take 650 mg by mouth every 8 (eight) hours as needed for pain.   amLODipine  (NORVASC ) 5 MG tablet Take 1 tablet (5 mg total) by mouth daily.   aspirin  EC 81 MG tablet Take 81 mg by mouth daily.   atorvastatin  (LIPITOR) 40 MG tablet Take 1 tablet (40 mg total) by mouth daily.  carvedilol  (COREG ) 12.5 MG tablet Take 1 tablet (12.5 mg total) by mouth 2 (two) times daily with a meal.   cholecalciferol  (VITAMIN D3) 25 MCG (1000 UNIT) tablet Take 1,000 Units by mouth daily.   cyanocobalamin  1000 MCG tablet Take 1,000 mcg by mouth daily.   ezetimibe  (ZETIA ) 10 MG tablet Take 1 tablet (10 mg total) by mouth daily.   Flaxseed, Linseed, (FLAXSEED OIL) 1200 MG CAPS Take by mouth.   levocetirizine (XYZAL ) 5 MG tablet Take one tablet by mouth two times daily   montelukast  (SINGULAIR ) 10 MG tablet Take 1 tablet (10 mg total) by mouth at bedtime.   olmesartan  (BENICAR ) 20 MG tablet Take 1 tablet (20 mg total) by mouth  daily.   No facility-administered encounter medications on file as of 07/17/2024.   Hearing/Vision screen Hearing Screening - Comments:: Patient denies any hearing difficulties.   Vision Screening - Comments:: Wears rx glasses - up to date with routine eye exams with  Oneil Darroll Packer Immunizations and Health Maintenance Health Maintenance  Topic Date Due   DTaP/Tdap/Td (2 - Td or Tdap) 01/31/2021   Medicare Annual Wellness (AWV)  01/19/2025   Mammogram  03/08/2025   Bone Density Scan  03/08/2026   Pneumococcal Vaccine: 50+ Years  Completed   Influenza Vaccine  Completed   Zoster Vaccines- Shingrix  Completed   Meningococcal B Vaccine  Aged Out   COVID-19 Vaccine  Discontinued   Hepatitis C Screening  Discontinued        Assessment/Plan:  This is a routine wellness examination for [facility]  Patient Care Team: Antonetta Rollene BRAVO, MD as PCP - General Rourk, Lamar HERO, MD as Consulting Physician (Gastroenterology) Lenis Ethelle ORN, MD as Consulting Physician (Endocrinology) Jayne Vonn DEL, MD as Consulting Physician (Obstetrics and Gynecology) Margrette Taft BRAVO, MD as Consulting Physician (Orthopedic Surgery) Okey Vina GAILS, MD as Consulting Physician (Cardiology)  I have personally reviewed and noted the following in the patient's chart:   Medical and social history Use of alcohol, tobacco or illicit drugs  Current medications and supplements including opioid prescriptions. Functional ability and status Nutritional status Physical activity Advanced directives List of other physicians Hospitalizations, surgeries, and ER visits in previous 12 months Vitals Screenings to include cognitive, depression, and falls Referrals and appointments  No orders of the defined types were placed in this encounter.  In addition, I have reviewed and discussed with patient certain preventive protocols, quality metrics, and best practice recommendations. A written personalized  care plan for preventive services as well as general preventive health recommendations were provided to patient.   Sharika Mosquera, CMA   07/17/2024   Return July 22, 2025 at 2:30pm, for your yearly Medicare Wellness Visit in person.  After Visit Summary: (In Person-Printed) AVS printed and given to the patient  Nurse Notes: n/a

## 2024-07-17 NOTE — Patient Instructions (Addendum)
 Kristen Cox,  Thank you for taking the time for your Medicare Wellness Visit. I appreciate your continued commitment to your health goals. Please review the care plan we discussed, and feel free to reach out if I can assist you further.  Please note that Annual Wellness Visits do not include a physical exam. Some assessments may be limited, especially if the visit was conducted virtually. If needed, we may recommend an in-person follow-up with your provider.  Ongoing Care Seeing your primary care provider every 3 to 6 months helps us  monitor your health and provide consistent, personalized care.   1 yr follow up with Medicare Wellness Nurse: July 22, 2025 at 2:30pm in person   Referrals If a referral was made during today's visit and you haven't received any updates within two weeks, please contact the referred provider directly to check on the status.     Recommended Screenings:  Health Maintenance  Topic Date Due   DTaP/Tdap/Td vaccine (2 - Td or Tdap) 01/31/2021   Medicare Annual Wellness Visit  01/19/2025   Breast Cancer Screening  03/08/2025   Osteoporosis screening with Bone Density Scan  03/08/2026   Pneumococcal Vaccine for age over 74  Completed   Flu Shot  Completed   Zoster (Shingles) Vaccine  Completed   Meningitis B Vaccine  Aged Out   COVID-19 Vaccine  Discontinued   Hepatitis C Screening  Discontinued       07/17/2024    1:39 PM  Advanced Directives  Does Patient Have a Medical Advance Directive? No  Would patient like information on creating a medical advance directive? Yes (MAU/Ambulatory/Procedural Areas - Information given)    Vision: Annual vision screenings are recommended for early detection of glaucoma, cataracts, and diabetic retinopathy. These exams can also reveal signs of chronic conditions such as diabetes and high blood pressure.  Dental: Annual dental screenings help detect early signs of oral cancer, gum disease, and other conditions linked  to overall health, including heart disease and diabetes.  Please see the attached documents for additional preventive care recommendations.

## 2024-07-23 NOTE — Progress Notes (Signed)
 Chief Complaint  Patient presents with   Medicare Wellness     Subjective:   Kristen Cox is a 83 y.o. female who presents for a Medicare Annual Wellness Visit.  Allergies (verified) Amlodipine , Benazepril, and Metoprolol    History: Past Medical History:  Diagnosis Date   Deep vein thrombosis (DVT) (HCC) 08/2016   Right leg   Dry eyes 2010   on restasis - Dr. Darroll    Gout 2009   Hyperlipidemia    Hypertension    Pneumonia 01/13/2024   Spinal stenosis    Past Surgical History:  Procedure Laterality Date   CESAREAN SECTION     COLONOSCOPY  04/09/2009   MFM:Jwjo papilla otherwise, normal rectum/Left-sided diverticula/Cecal polyp status post cold snare and removal/The remainder of the colonic mucosa appeared normal. Adenomatous polyps, next TCS 03/2016 per RMR.   COLONOSCOPY N/A 05/19/2016   Procedure: COLONOSCOPY;  Surgeon: Lamar CHRISTELLA Hollingshead, MD;  Location: [facility];  Service: Endoscopy;  Laterality: N/A;  1:00 PM   Family History  Problem Relation Age of Onset   Stroke Mother    Stroke Father    Diabetes Sister    Pulmonary embolism Sister    Cirrhosis Brother    Colon cancer Neg Hx    Social History   Occupational History   Occupation: retired   Tobacco Use   Smoking status: Former    Current packs/day: 0.00    Average packs/day: 0.5 packs/day for 10.0 years (5.0 ttl pk-yrs)    Types: Cigarettes    Start date: 10/06/1973    Quit date: 10/07/1983    Years since quitting: 40.8   Smokeless tobacco: Never  Vaping Use   Vaping status: Never Used  Substance and Sexual Activity   Alcohol use: No   Drug use: No   Sexual activity: Not Currently    Birth control/protection: Post-menopausal   Tobacco Counseling Counseling given: Not Answered  SDOH Screenings   Food Insecurity: No Food Insecurity (07/17/2024)  Housing: Unknown (07/17/2024)  Transportation Needs: No Transportation Needs (07/17/2024)  Utilities: Not At Risk (07/17/2024)  Alcohol Screen:  Low Risk  (01/20/2022)  Depression (PHQ2-9): Low Risk  (07/17/2024)  Recent Concern: Depression (PHQ2-9) - Medium Risk (04/24/2024)  Financial Resource Strain: Low Risk  (07/13/2023)  Physical Activity: Sufficiently Active (07/17/2024)  Social Connections: Socially Isolated (07/17/2024)  Stress: No Stress Concern Present (07/17/2024)  Tobacco Use: Medium Risk (07/17/2024)  Health Literacy: Adequate Health Literacy (07/17/2024)   See flowsheets for full screening details  Depression Screen PHQ 2 & 9 Depression Scale- Over the past 2 weeks, how often have you been bothered by any of the following problems? Little interest or pleasure in doing things: 0 Feeling down, depressed, or hopeless (PHQ Adolescent also includes...irritable): 0 PHQ-2 Total Score: 0 Trouble falling or staying asleep, or sleeping too much: 0 Feeling tired or having little energy: 2 Poor appetite or overeating (PHQ Adolescent also includes...weight loss): 0 Feeling bad about yourself - or that you are a failure or have let yourself or your family down: 0 Trouble concentrating on things, such as reading the newspaper or watching television (PHQ Adolescent also includes...like school work): 0 Moving or speaking so slowly that other people could have noticed. Or the opposite - being so fidgety or restless that you have been moving around a lot more than usual: 0 Thoughts that you would be better off dead, or of hurting yourself in some way: 0 PHQ-9 Total Score: 7 If you checked off  any problems, how difficult have these problems made it for you to do your work, take care of things at home, or get along with other people?: Not difficult at all     Goals Addressed               This Visit's Progress     I'd like to have less arthritis pain (pt-stated)         Visit info / Clinical Intake: Medicare Wellness Visit Type:: Subsequent Annual Wellness Visit Persons participating in visit:: patient Medicare Wellness  Visit Mode:: In-person (required for WTM) Information given by:: patient Interpreter Needed?: No Pre-visit prep was completed: yes AWV questionnaire completed by patient prior to visit?: no Living arrangements:: with family/others Patient's Overall Health Status Rating: (!) fair Typical amount of pain: some Does pain affect daily life?: no Are you currently prescribed opioids?: no  Dietary Habits and Nutritional Risks How many meals a day?: 2 Eats fruit and vegetables daily?: yes Most meals are obtained by: eating out In the last 2 weeks, have you had any of the following?: none Diabetic:: no  Functional Status Activities of Daily Living (to include ambulation/medication): (!) Needs Assist Feeding: Independent Dressing/Grooming: Independent Bathing: Independent Toileting: Independent Transfer: Independent with device- listed below Ambulation: Independent with device- listed below Home Assistive Devices/Equipment: Walker (specify Type) Medication Administration: Independent Home Management: Needs assistance (comment) Manage your own finances?: yes Primary transportation is: driving Concerns about vision?: no *vision screening is required for WTM* Concerns about hearing?: no  Fall Screening Falls in the past year?: 1 Number of falls in past year: 0 Was there an injury with Fall?: 1 Fall Risk Category Calculator: 2 Patient Fall Risk Level: Moderate Fall Risk  Fall Risk Patient at Risk for Falls Due to: History of fall(s); Impaired balance/gait; Impaired mobility; Orthopedic patient Fall risk Follow up: Falls evaluation completed; Education provided; Falls prevention discussed  Home and Transportation Safety: All rugs have non-skid backing?: yes All stairs or steps have railings?: yes Grab bars in the bathtub or shower?: yes Have non-skid surface in bathtub or shower?: yes (patient has a shower chair) Good home lighting?: yes Regular seat belt use?: yes Hospital stays  in the last year:: (!) yes How many hospital stays:: 1 Reason: fall  Cognitive Assessment Difficulty concentrating, remembering, or making decisions? : no Will 6CIT or Mini Cog be Completed: no 6CIT or Mini Cog Declined: patient alert, oriented, able to answer questions appropriately and recall recent events  Advance Directives (For Healthcare) Does Patient Have a Medical Advance Directive?: No Would patient like information on creating a medical advance directive?: Yes (MAU/Ambulatory/Procedural Areas - Information given)  Reviewed/Updated  Reviewed/Updated: Reviewed All (Medical, Surgical, Family, Medications, Allergies, Care Teams, Patient Goals)        Objective:    Today's Vitals   07/17/24 1336  BP: 120/60  Pulse: 73  Resp: 12  SpO2: 97%  Weight: 140 lb (63.5 kg)  Height: 5' 2.5 (1.588 m)   Body mass index is 25.2 kg/m.  Current Medications (verified) Outpatient Encounter Medications as of 07/17/2024  Medication Sig   acetaminophen  (TYLENOL ) 650 MG CR tablet Take 650 mg by mouth every 8 (eight) hours as needed for pain.   amLODipine  (NORVASC ) 5 MG tablet Take 1 tablet (5 mg total) by mouth daily.   aspirin  EC 81 MG tablet Take 81 mg by mouth daily.   atorvastatin  (LIPITOR) 40 MG tablet Take 1 tablet (40 mg total) by mouth daily.  carvedilol  (COREG ) 12.5 MG tablet Take 1 tablet (12.5 mg total) by mouth 2 (two) times daily with a meal.   cholecalciferol  (VITAMIN D3) 25 MCG (1000 UNIT) tablet Take 1,000 Units by mouth daily.   cyanocobalamin  1000 MCG tablet Take 1,000 mcg by mouth daily.   ezetimibe  (ZETIA ) 10 MG tablet Take 1 tablet (10 mg total) by mouth daily.   Flaxseed, Linseed, (FLAXSEED OIL) 1200 MG CAPS Take by mouth.   levocetirizine (XYZAL ) 5 MG tablet Take one tablet by mouth two times daily   montelukast  (SINGULAIR ) 10 MG tablet Take 1 tablet (10 mg total) by mouth at bedtime.   olmesartan  (BENICAR ) 20 MG tablet Take 1 tablet (20 mg total) by mouth  daily.   No facility-administered encounter medications on file as of 07/17/2024.   Hearing/Vision screen Hearing Screening - Comments:: Patient denies any hearing difficulties.   Vision Screening - Comments:: Wears rx glasses - up to date with routine eye exams with  Oneil Darroll Packer Immunizations and Health Maintenance Health Maintenance  Topic Date Due   DTaP/Tdap/Td (2 - Td or Tdap) 01/31/2021   Mammogram  03/08/2025   Medicare Annual Wellness (AWV)  07/17/2025   Bone Density Scan  03/08/2026   Pneumococcal Vaccine: 50+ Years  Completed   Influenza Vaccine  Completed   Zoster Vaccines- Shingrix  Completed   Meningococcal B Vaccine  Aged Out   COVID-19 Vaccine  Discontinued   Hepatitis C Screening  Discontinued        Assessment/Plan:  This is a routine wellness examination for [facility].  Patient Care Team: Antonetta Rollene BRAVO, MD as PCP - General Rourk, Lamar HERO, MD as Consulting Physician (Gastroenterology) Lenis Ethelle ORN, MD as Consulting Physician (Endocrinology) Jayne Vonn DEL, MD as Consulting Physician (Obstetrics and Gynecology) Margrette Taft BRAVO, MD as Consulting Physician (Orthopedic Surgery) Okey Vina GAILS, MD as Consulting Physician (Cardiology)  I have personally reviewed and noted the following in the patient's chart:   Medical and social history Use of alcohol, tobacco or illicit drugs  Current medications and supplements including opioid prescriptions. Functional ability and status Nutritional status Physical activity Advanced directives List of other physicians Hospitalizations, surgeries, and ER visits in previous 12 months Vitals Screenings to include cognitive, depression, and falls Referrals and appointments  No orders of the defined types were placed in this encounter.  In addition, I have reviewed and discussed with patient certain preventive protocols, quality metrics, and best practice recommendations. A written personalized  care plan for preventive services as well as general preventive health recommendations were provided to patient.   Ova Gillentine, CMA   07/23/2024   Return July 22, 2025 at 2:30pm, for your yearly Medicare Wellness Visit in person.  After Visit Summary: (In Person-Printed) AVS printed and given to the patient  Nurse Notes: n/a

## 2024-09-14 LAB — CMP14+EGFR
ALT: 16 IU/L (ref 0–32)
AST: 20 IU/L (ref 0–40)
Albumin: 4.4 g/dL (ref 3.7–4.7)
Alkaline Phosphatase: 82 IU/L (ref 48–129)
BUN/Creatinine Ratio: 20 (ref 12–28)
BUN: 13 mg/dL (ref 8–27)
Bilirubin Total: 0.6 mg/dL (ref 0.0–1.2)
CO2: 20 mmol/L (ref 20–29)
Calcium: 10.5 mg/dL — ABNORMAL HIGH (ref 8.7–10.3)
Chloride: 107 mmol/L — ABNORMAL HIGH (ref 96–106)
Creatinine, Ser: 0.66 mg/dL (ref 0.57–1.00)
Globulin, Total: 2.7 g/dL (ref 1.5–4.5)
Glucose: 113 mg/dL — ABNORMAL HIGH (ref 70–99)
Potassium: 3.9 mmol/L (ref 3.5–5.2)
Sodium: 142 mmol/L (ref 134–144)
Total Protein: 7.1 g/dL (ref 6.0–8.5)
eGFR: 87 mL/min/1.73

## 2024-09-14 LAB — LIPID PANEL
Chol/HDL Ratio: 3 ratio (ref 0.0–4.4)
Cholesterol, Total: 132 mg/dL (ref 100–199)
HDL: 44 mg/dL
LDL Chol Calc (NIH): 72 mg/dL (ref 0–99)
Triglycerides: 80 mg/dL (ref 0–149)
VLDL Cholesterol Cal: 16 mg/dL (ref 5–40)

## 2024-09-25 ENCOUNTER — Other Ambulatory Visit (HOSPITAL_COMMUNITY): Payer: Self-pay | Admitting: Family Medicine

## 2024-09-25 ENCOUNTER — Ambulatory Visit: Admitting: Family Medicine

## 2024-09-25 ENCOUNTER — Encounter: Payer: Self-pay | Admitting: Family Medicine

## 2024-09-25 ENCOUNTER — Telehealth: Payer: Self-pay | Admitting: Family Medicine

## 2024-09-25 VITALS — BP 148/80 | HR 85 | Resp 16 | Ht 62.5 in | Wt 162.8 lb

## 2024-09-25 DIAGNOSIS — Z0001 Encounter for general adult medical examination with abnormal findings: Secondary | ICD-10-CM

## 2024-09-25 DIAGNOSIS — M25562 Pain in left knee: Secondary | ICD-10-CM | POA: Diagnosis not present

## 2024-09-25 DIAGNOSIS — Z7189 Other specified counseling: Secondary | ICD-10-CM | POA: Insufficient documentation

## 2024-09-25 DIAGNOSIS — M25561 Pain in right knee: Secondary | ICD-10-CM

## 2024-09-25 DIAGNOSIS — J9859 Other diseases of mediastinum, not elsewhere classified: Secondary | ICD-10-CM

## 2024-09-25 DIAGNOSIS — K5730 Diverticulosis of large intestine without perforation or abscess without bleeding: Secondary | ICD-10-CM | POA: Diagnosis not present

## 2024-09-25 DIAGNOSIS — M109 Gout, unspecified: Secondary | ICD-10-CM

## 2024-09-25 DIAGNOSIS — R7303 Prediabetes: Secondary | ICD-10-CM

## 2024-09-25 DIAGNOSIS — M25569 Pain in unspecified knee: Secondary | ICD-10-CM | POA: Insufficient documentation

## 2024-09-25 DIAGNOSIS — N9489 Other specified conditions associated with female genital organs and menstrual cycle: Secondary | ICD-10-CM | POA: Insufficient documentation

## 2024-09-25 DIAGNOSIS — Z1231 Encounter for screening mammogram for malignant neoplasm of breast: Secondary | ICD-10-CM

## 2024-09-25 MED ORDER — METHYLPREDNISOLONE ACETATE 80 MG/ML IJ SUSP
40.0000 mg | Freq: Once | INTRAMUSCULAR | Status: AC
  Administered 2024-09-25: 40 mg via INTRAMUSCULAR

## 2024-09-25 NOTE — Telephone Encounter (Signed)
 Pls call pt and son, explain that while reviewing her record a  mass  was noted in her pelvis last May and MRI recommended to further evaluate. I have ordered the MRI so that this can be looked at so radiology will call with appt info, important she have test done Pls call her with this info and review labs she had drawn at the same time I will  send htat result note to you ??/ concerns pls ask!

## 2024-09-25 NOTE — Assessment & Plan Note (Signed)
 Annual exam as documented. . Immunization and cancer screening needs are specifically addressed at this visit.

## 2024-09-25 NOTE — Assessment & Plan Note (Signed)
-  asymptomatic

## 2024-09-25 NOTE — Assessment & Plan Note (Signed)
 Radiologist favors benign nature given stability , this noted in 2025

## 2024-09-25 NOTE — Assessment & Plan Note (Signed)
 Depomedrol 40 mg IM at visit

## 2024-09-25 NOTE — Assessment & Plan Note (Addendum)
 Advanced directive signed and notarized 09/06/2024 brought in by patient and reiewed during the visit  She Wants ALL resuscitate measures in the event of an arrest , is a full code and has signed and notarized paperworkstating that neiher of her 2 sons is authorized to change this decision

## 2024-09-25 NOTE — Assessment & Plan Note (Signed)
 Patient educated about the importance of limiting  Carbohydrate intake , the need to commit to daily physical activity for a minimum of 30 minutes , and to commit weight loss. The fact that changes in all these areas will reduce or eliminate all together the development of diabetes is stressed.      Latest Ref Rng & Units 09/13/2024    8:08 AM 03/14/2024    8:20 AM 02/14/2024   11:46 AM 01/19/2024    4:56 AM 01/18/2024    4:38 AM  Diabetic Labs  Chol 100 - 199 mg/dL 867       HDL >60 mg/dL 44       Calc LDL 0 - 99 mg/dL 72       Triglycerides 0 - 149 mg/dL 80       Creatinine 9.42 - 1.00 mg/dL 9.33  9.29  9.28  9.50  0.56       09/25/2024   10:58 AM 09/25/2024   10:49 AM 07/17/2024    1:36 PM 06/19/2024    9:21 AM 04/24/2024   11:01 AM 04/24/2024   10:38 AM 03/27/2024    9:08 AM  BP/Weight  Systolic BP 148 150 120 149 138 165 138  Diastolic BP 80 76 60 72 72 89 80  Wt. (Lbs)  162.8 140   160 156.6  BMI  29.3 kg/m2 25.2 kg/m2   28.8 kg/m2 28.19 kg/m2       No data to display          Updated lab needed

## 2024-09-25 NOTE — Assessment & Plan Note (Addendum)
 Asymptomatic, check uric acid level

## 2024-09-26 ENCOUNTER — Ambulatory Visit: Payer: Self-pay | Admitting: Family Medicine

## 2024-09-26 LAB — HEMOGLOBIN A1C
Est. average glucose Bld gHb Est-mCnc: 120 mg/dL
Hgb A1c MFr Bld: 5.8 % — ABNORMAL HIGH (ref 4.8–5.6)

## 2024-09-26 LAB — URIC ACID: Uric Acid: 6.1 mg/dL (ref 3.1–7.9)

## 2024-09-26 NOTE — Telephone Encounter (Signed)
 Pt informed, verbalized understanding

## 2024-10-03 ENCOUNTER — Ambulatory Visit (HOSPITAL_COMMUNITY)

## 2024-11-02 ENCOUNTER — Ambulatory Visit: Admitting: Internal Medicine

## 2025-01-02 ENCOUNTER — Ambulatory Visit: Payer: Self-pay | Admitting: Family Medicine

## 2025-03-11 ENCOUNTER — Ambulatory Visit (HOSPITAL_COMMUNITY)

## 2025-03-27 ENCOUNTER — Ambulatory Visit: Admitting: Nurse Practitioner

## 2025-07-22 ENCOUNTER — Ambulatory Visit
